# Patient Record
Sex: Female | Born: 1935
Health system: Southern US, Community
[De-identification: ages and names within clinical notes are randomized; demographics above are authoritative.]

## PROBLEM LIST (undated history)

## (undated) DIAGNOSIS — E785 Hyperlipidemia, unspecified: Secondary | ICD-10-CM

## (undated) DIAGNOSIS — G459 Transient cerebral ischemic attack, unspecified: Secondary | ICD-10-CM

## (undated) DIAGNOSIS — E039 Hypothyroidism, unspecified: Secondary | ICD-10-CM

## (undated) DIAGNOSIS — R000 Tachycardia, unspecified: Secondary | ICD-10-CM

## (undated) DIAGNOSIS — G250 Essential tremor: Secondary | ICD-10-CM

## (undated) HISTORY — DX: Essential tremor: G25.0

## (undated) HISTORY — DX: Hypothyroidism, unspecified: E03.9

## (undated) HISTORY — PX: BREAST BIOPSY: SHX20

## (undated) HISTORY — DX: Hyperlipidemia, unspecified: E78.5

## (undated) HISTORY — PX: ABDOMINAL HYSTERECTOMY: SHX81

## (undated) HISTORY — DX: Transient cerebral ischemic attack, unspecified: G45.9

## (undated) HISTORY — DX: Tachycardia, unspecified: R00.0

## (undated) HISTORY — PX: BLADDER SURGERY: SHX569

---

## 2004-02-15 ENCOUNTER — Other Ambulatory Visit: Admission: RE | Admit: 2004-02-15 | Discharge: 2004-02-15 | Payer: Self-pay | Admitting: Internal Medicine

## 2004-07-18 ENCOUNTER — Inpatient Hospital Stay (HOSPITAL_COMMUNITY): Admission: EM | Admit: 2004-07-18 | Discharge: 2004-07-20 | Payer: Self-pay | Admitting: Emergency Medicine

## 2004-10-14 ENCOUNTER — Ambulatory Visit: Payer: Self-pay | Admitting: Internal Medicine

## 2004-10-20 ENCOUNTER — Ambulatory Visit: Payer: Self-pay | Admitting: Internal Medicine

## 2004-10-31 ENCOUNTER — Ambulatory Visit: Payer: Self-pay | Admitting: Internal Medicine

## 2005-01-11 ENCOUNTER — Ambulatory Visit: Payer: Self-pay | Admitting: Internal Medicine

## 2005-02-09 ENCOUNTER — Ambulatory Visit: Payer: Self-pay | Admitting: Internal Medicine

## 2005-03-31 ENCOUNTER — Ambulatory Visit: Payer: Self-pay | Admitting: Internal Medicine

## 2005-05-17 ENCOUNTER — Ambulatory Visit: Payer: Self-pay | Admitting: Internal Medicine

## 2005-06-05 ENCOUNTER — Ambulatory Visit: Payer: Self-pay | Admitting: Internal Medicine

## 2005-08-22 ENCOUNTER — Ambulatory Visit: Payer: Self-pay | Admitting: Internal Medicine

## 2005-08-23 ENCOUNTER — Ambulatory Visit: Payer: Self-pay | Admitting: Internal Medicine

## 2005-11-02 ENCOUNTER — Ambulatory Visit: Payer: Self-pay | Admitting: Internal Medicine

## 2006-02-27 ENCOUNTER — Ambulatory Visit: Payer: Self-pay | Admitting: Family Medicine

## 2006-08-24 ENCOUNTER — Ambulatory Visit: Payer: Self-pay | Admitting: Family Medicine

## 2006-08-31 ENCOUNTER — Ambulatory Visit: Payer: Self-pay | Admitting: Internal Medicine

## 2007-03-25 ENCOUNTER — Ambulatory Visit: Payer: Self-pay | Admitting: Internal Medicine

## 2007-03-25 LAB — CONVERTED CEMR LAB: TSH: 6.74 microintl units/mL — ABNORMAL HIGH (ref 0.35–5.50)

## 2007-04-02 ENCOUNTER — Ambulatory Visit: Payer: Self-pay | Admitting: Internal Medicine

## 2007-04-05 DIAGNOSIS — S32020A Wedge compression fracture of second lumbar vertebra, initial encounter for closed fracture: Secondary | ICD-10-CM | POA: Insufficient documentation

## 2007-04-05 DIAGNOSIS — E039 Hypothyroidism, unspecified: Secondary | ICD-10-CM | POA: Insufficient documentation

## 2007-05-02 ENCOUNTER — Encounter: Admission: RE | Admit: 2007-05-02 | Discharge: 2007-05-02 | Payer: Self-pay | Admitting: Internal Medicine

## 2007-05-08 ENCOUNTER — Encounter (INDEPENDENT_AMBULATORY_CARE_PROVIDER_SITE_OTHER): Payer: Self-pay | Admitting: *Deleted

## 2007-07-08 ENCOUNTER — Telehealth (INDEPENDENT_AMBULATORY_CARE_PROVIDER_SITE_OTHER): Payer: Self-pay | Admitting: *Deleted

## 2007-08-09 ENCOUNTER — Telehealth (INDEPENDENT_AMBULATORY_CARE_PROVIDER_SITE_OTHER): Payer: Self-pay | Admitting: *Deleted

## 2007-08-12 ENCOUNTER — Ambulatory Visit: Payer: Self-pay | Admitting: Internal Medicine

## 2007-08-13 LAB — CONVERTED CEMR LAB: TSH: 4.85 microintl units/mL (ref 0.35–5.50)

## 2007-11-18 ENCOUNTER — Telehealth (INDEPENDENT_AMBULATORY_CARE_PROVIDER_SITE_OTHER): Payer: Self-pay | Admitting: *Deleted

## 2007-11-28 ENCOUNTER — Ambulatory Visit: Payer: Self-pay | Admitting: Internal Medicine

## 2007-12-02 ENCOUNTER — Telehealth (INDEPENDENT_AMBULATORY_CARE_PROVIDER_SITE_OTHER): Payer: Self-pay | Admitting: *Deleted

## 2007-12-05 ENCOUNTER — Telehealth (INDEPENDENT_AMBULATORY_CARE_PROVIDER_SITE_OTHER): Payer: Self-pay | Admitting: *Deleted

## 2008-01-03 ENCOUNTER — Telehealth (INDEPENDENT_AMBULATORY_CARE_PROVIDER_SITE_OTHER): Payer: Self-pay | Admitting: *Deleted

## 2008-03-04 ENCOUNTER — Ambulatory Visit: Payer: Self-pay | Admitting: Internal Medicine

## 2008-03-04 DIAGNOSIS — G250 Essential tremor: Secondary | ICD-10-CM | POA: Insufficient documentation

## 2008-03-04 DIAGNOSIS — G252 Other specified forms of tremor: Secondary | ICD-10-CM | POA: Insufficient documentation

## 2008-03-04 DIAGNOSIS — M810 Age-related osteoporosis without current pathological fracture: Secondary | ICD-10-CM | POA: Insufficient documentation

## 2008-03-04 DIAGNOSIS — E785 Hyperlipidemia, unspecified: Secondary | ICD-10-CM | POA: Insufficient documentation

## 2008-03-04 DIAGNOSIS — M2550 Pain in unspecified joint: Secondary | ICD-10-CM | POA: Insufficient documentation

## 2008-03-04 DIAGNOSIS — I499 Cardiac arrhythmia, unspecified: Secondary | ICD-10-CM | POA: Insufficient documentation

## 2008-03-05 ENCOUNTER — Telehealth (INDEPENDENT_AMBULATORY_CARE_PROVIDER_SITE_OTHER): Payer: Self-pay | Admitting: *Deleted

## 2008-03-10 ENCOUNTER — Ambulatory Visit: Payer: Self-pay | Admitting: Internal Medicine

## 2008-03-18 ENCOUNTER — Telehealth (INDEPENDENT_AMBULATORY_CARE_PROVIDER_SITE_OTHER): Payer: Self-pay | Admitting: *Deleted

## 2008-03-18 LAB — CONVERTED CEMR LAB
AST: 25 units/L (ref 0–37)
BUN: 13 mg/dL (ref 6–23)
Basophils Absolute: 0 10*3/uL (ref 0.0–0.1)
Basophils Relative: 0 % (ref 0.0–1.0)
Calcium: 9.1 mg/dL (ref 8.4–10.5)
Chloride: 106 meq/L (ref 96–112)
Cholesterol: 222 mg/dL (ref 0–200)
Creatinine, Ser: 0.7 mg/dL (ref 0.4–1.2)
Eosinophils Relative: 2.4 % (ref 0.0–5.0)
Glucose, Bld: 119 mg/dL — ABNORMAL HIGH (ref 70–99)
HCT: 40.5 % (ref 36.0–46.0)
Monocytes Absolute: 0.8 10*3/uL (ref 0.1–1.0)
Monocytes Relative: 13.2 % — ABNORMAL HIGH (ref 3.0–12.0)
Neutro Abs: 3.7 10*3/uL (ref 1.4–7.7)
Platelets: 229 10*3/uL (ref 150–400)
RDW: 12.3 % (ref 11.5–14.6)
TSH: 0.29 microintl units/mL — ABNORMAL LOW (ref 0.35–5.50)

## 2008-06-17 ENCOUNTER — Ambulatory Visit: Payer: Self-pay | Admitting: Internal Medicine

## 2008-10-20 DIAGNOSIS — G459 Transient cerebral ischemic attack, unspecified: Secondary | ICD-10-CM

## 2008-10-20 HISTORY — DX: Transient cerebral ischemic attack, unspecified: G45.9

## 2008-11-18 ENCOUNTER — Telehealth: Payer: Self-pay | Admitting: Internal Medicine

## 2008-11-18 ENCOUNTER — Emergency Department (HOSPITAL_COMMUNITY): Admission: EM | Admit: 2008-11-18 | Discharge: 2008-11-18 | Payer: Self-pay | Admitting: Emergency Medicine

## 2008-11-19 ENCOUNTER — Ambulatory Visit: Payer: Self-pay | Admitting: Internal Medicine

## 2008-11-19 DIAGNOSIS — R739 Hyperglycemia, unspecified: Secondary | ICD-10-CM | POA: Insufficient documentation

## 2008-11-19 DIAGNOSIS — G459 Transient cerebral ischemic attack, unspecified: Secondary | ICD-10-CM | POA: Insufficient documentation

## 2008-11-23 ENCOUNTER — Telehealth (INDEPENDENT_AMBULATORY_CARE_PROVIDER_SITE_OTHER): Payer: Self-pay | Admitting: *Deleted

## 2008-11-23 ENCOUNTER — Encounter (INDEPENDENT_AMBULATORY_CARE_PROVIDER_SITE_OTHER): Payer: Self-pay | Admitting: *Deleted

## 2008-11-23 LAB — CONVERTED CEMR LAB
ALT: 12 units/L (ref 0–35)
Direct LDL: 194.6 mg/dL
HDL: 53.1 mg/dL (ref >39.0)
Hgb A1c MFr Bld: 5.9 % (ref 4.6–6.0)
TSH: 1.72 microintl units/mL (ref 0.35–5.50)
Total CHOL/HDL Ratio: 5.5
Triglycerides: 182 mg/dL — ABNORMAL HIGH (ref 0–149)

## 2008-11-24 ENCOUNTER — Telehealth (INDEPENDENT_AMBULATORY_CARE_PROVIDER_SITE_OTHER): Payer: Self-pay | Admitting: *Deleted

## 2008-11-27 ENCOUNTER — Telehealth: Payer: Self-pay | Admitting: Internal Medicine

## 2008-11-27 ENCOUNTER — Encounter: Admission: RE | Admit: 2008-11-27 | Discharge: 2008-11-27 | Payer: Self-pay | Admitting: Internal Medicine

## 2008-12-30 ENCOUNTER — Encounter (INDEPENDENT_AMBULATORY_CARE_PROVIDER_SITE_OTHER): Payer: Self-pay | Admitting: *Deleted

## 2009-01-22 ENCOUNTER — Encounter: Payer: Self-pay | Admitting: Internal Medicine

## 2009-01-22 ENCOUNTER — Ambulatory Visit: Payer: Self-pay

## 2009-01-26 ENCOUNTER — Telehealth (INDEPENDENT_AMBULATORY_CARE_PROVIDER_SITE_OTHER): Payer: Self-pay | Admitting: *Deleted

## 2009-03-17 ENCOUNTER — Telehealth (INDEPENDENT_AMBULATORY_CARE_PROVIDER_SITE_OTHER): Payer: Self-pay | Admitting: *Deleted

## 2009-04-28 ENCOUNTER — Telehealth (INDEPENDENT_AMBULATORY_CARE_PROVIDER_SITE_OTHER): Payer: Self-pay | Admitting: *Deleted

## 2009-06-23 ENCOUNTER — Telehealth (INDEPENDENT_AMBULATORY_CARE_PROVIDER_SITE_OTHER): Payer: Self-pay | Admitting: *Deleted

## 2009-07-16 ENCOUNTER — Telehealth (INDEPENDENT_AMBULATORY_CARE_PROVIDER_SITE_OTHER): Payer: Self-pay | Admitting: *Deleted

## 2009-08-23 ENCOUNTER — Telehealth: Payer: Self-pay | Admitting: Internal Medicine

## 2010-02-02 ENCOUNTER — Telehealth: Payer: Self-pay | Admitting: Internal Medicine

## 2010-02-14 ENCOUNTER — Encounter (INDEPENDENT_AMBULATORY_CARE_PROVIDER_SITE_OTHER): Payer: Self-pay | Admitting: *Deleted

## 2010-04-12 ENCOUNTER — Telehealth (INDEPENDENT_AMBULATORY_CARE_PROVIDER_SITE_OTHER): Payer: Self-pay | Admitting: *Deleted

## 2010-05-06 ENCOUNTER — Ambulatory Visit: Payer: Self-pay | Admitting: Internal Medicine

## 2010-05-06 DIAGNOSIS — L989 Disorder of the skin and subcutaneous tissue, unspecified: Secondary | ICD-10-CM | POA: Insufficient documentation

## 2010-05-06 LAB — CONVERTED CEMR LAB
HDL goal, serum: 40 mg/dL
LDL Goal: 70 mg/dL

## 2010-05-10 ENCOUNTER — Ambulatory Visit: Payer: Self-pay | Admitting: Internal Medicine

## 2010-05-10 LAB — CONVERTED CEMR LAB: Vit D, 25-Hydroxy: 39 ng/mL (ref 30–89)

## 2010-05-16 LAB — CONVERTED CEMR LAB
ALT: 9 units/L (ref 0–35)
Basophils Absolute: 0 10*3/uL (ref 0.0–0.1)
Basophils Relative: 0.4 % (ref 0.0–3.0)
Cholesterol: 163 mg/dL (ref 0–200)
Creatinine, Ser: 0.7 mg/dL (ref 0.4–1.2)
Creatinine,U: 151.2 mg/dL
Eosinophils Absolute: 0.2 10*3/uL (ref 0.0–0.7)
Eosinophils Relative: 3.6 % (ref 0.0–5.0)
HCT: 39.1 % (ref 36.0–46.0)
HDL: 54.7 mg/dL (ref >39.00)
Hemoglobin: 13.2 g/dL (ref 12.0–15.0)
Lymphocytes Relative: 31.1 % (ref 12.0–46.0)
Lymphs Abs: 1.9 10*3/uL (ref 0.7–4.0)
MCHC: 33.8 g/dL (ref 30.0–36.0)
MCV: 92.2 fL (ref 78.0–100.0)
Microalb, Ur: 2.3 mg/dL — ABNORMAL HIGH (ref 0.0–1.9)
Monocytes Absolute: 0.6 10*3/uL (ref 0.1–1.0)
Neutro Abs: 3.3 10*3/uL (ref 1.4–7.7)
Platelets: 216 10*3/uL (ref 150.0–400.0)
Potassium: 4.7 meq/L (ref 3.5–5.1)
TSH: 0.18 microintl units/mL — ABNORMAL LOW (ref 0.35–5.50)
Triglycerides: 94 mg/dL (ref 0.0–149.0)
WBC: 6 10*3/uL (ref 4.5–10.5)

## 2010-07-07 ENCOUNTER — Telehealth (INDEPENDENT_AMBULATORY_CARE_PROVIDER_SITE_OTHER): Payer: Self-pay | Admitting: *Deleted

## 2010-07-11 ENCOUNTER — Ambulatory Visit: Payer: Self-pay | Admitting: Internal Medicine

## 2010-07-15 LAB — CONVERTED CEMR LAB: TSH: 0.63 microintl units/mL (ref 0.35–5.50)

## 2010-08-22 ENCOUNTER — Telehealth: Payer: Self-pay | Admitting: Internal Medicine

## 2010-12-22 NOTE — Progress Notes (Signed)
Summary: LEVOTHYROXINE,  PRAVASTATIN REFILLS--WANTS 90 DAYS  Phone Note Refill Request Call back at Home Phone [phone] Message from:  Patient on August 22, 2010 9:30 AM  Refills Requested: Medication #1:  SYNTHROID 88 MCG TABS take 1 tab once daily  Medication #2:  PRAVASTATIN SODIUM 40 MG TABS 1 by mouth at bedtime -MUST KEEP APPT ON 04/12/10 [facility], [address], [place]     ****WANTS 90 DAY SUPPLY FOR BOTH ****  WANTS GENERIC FOR SYNTHROID   Initial call taken by: Jerolyn Shin,  August 22, 2010 9:31 AM  Follow-up for Phone Call        Okay to fill Generic Synthroid?  Follow-up by: Army Fossa CMA,  August 22, 2010 9:33 AM  Additional Follow-up for Phone Call Additional follow up Details #1::        okay generics, ok 90 days and one refill Additional Follow-up by: [id number] E. Paz MD,  August 22, 2010 11:10 AM    New/Updated Medications: LEVOTHYROXINE SODIUM 88 MCG TABS (LEVOTHYROXINE SODIUM) 1 by mouth daily. Prescriptions: LEVOTHYROXINE SODIUM 88 MCG TABS (LEVOTHYROXINE SODIUM) 1 by mouth daily.  #90 x 1   Entered by:   Army Fossa CMA   Authorized by:   Nolon Rod. Paz MD   Signed by:   Army Fossa CMA on 08/22/2010   Method used:   Electronically to        [facility] # [phone]* (retail)       [address]       [place], [place]  [address]       Ph: [phone]       Fax: [phone]   RxID:   [id number] PRAVASTATIN SODIUM 40 MG TABS (PRAVASTATIN SODIUM) 1 by mouth at bedtime -MUST KEEP APPT ON 04/12/10  #90 x 1   Entered by:   Army Fossa CMA   Authorized by:   Nolon Rod. Paz MD   Signed by:   Army Fossa CMA on 08/22/2010   Method used:   Electronically to        [facility] # [phone]* (retail)       [address]       [place], [place]  [address]       Ph: [phone]       Fax: [phone]   RxID:   [id number]

## 2010-12-22 NOTE — Assessment & Plan Note (Signed)
Summary: yearly checkup/kdc//rsh//lch   Vital Signs:  Patient profile:   76 year old female Height:      67 inches Weight:      157 pounds BMI:     24.68 Temp:     98.2 degrees F oral Pulse rate:   50 / minute Resp:     20 per minute BP sitting:   138 / 76  (left arm)  Vitals Entered By: Jeremy Johann CMA (May 06, 2010 1:09 PM) CC: cpx Comments -not fasting --refills REVIEWED MED LIST, PATIENT AGREED DOSE AND INSTRUCTION CORRECT    History of Present Illness: yearly checkup, chart reviewed Last office visit 10/2008  TIA 12-09-- no further symptoms   Hypothyroidism-- good medication compliance   Hyperlipidemia-- on statins, has not checked labs in a while   Osteoporosis-- not on Calcium or Vit D   h/o  AODM Dx 4-09 A1C 6  , patient states "I'm not diabetic"  L leg skin lesion x a while, no itching or bleeding    Allergies: 1)  ! Sulfa  Past History:  Past Medical History: Reviewed history from 11/19/2008 and no changes required. TIA 12-09 Hypothyroidism AV Re-entry tachycardia , s/p ablation aprox 2005 Hyperlipidemia Osteoporosis AODM Dx 4-09 A1C 6  Past Surgical History: Reviewed history from 03/04/2008 and no changes required. 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 CV:  Denies chest pain or discomfort, palpitations, and swelling of feet. Resp:  Denies cough and shortness of breath. GI:  Denies bloody stools, nausea, and vomiting. GU:  Denies discharge and dysuria. Psych:  emotionally now is doing well .  Physical Exam  General:  alert, well-developed, and well-nourished.   Neck:  no masses and no thyromegaly.   Breasts:  No  mass, nodules, thickening, tenderness, bulging, retraction, inflamation, nipple discharge or skin changes noted.  no axillary lymphadenopathy is Lungs:  normal respiratory effort, no intercostal retractions, no accessory muscle use, and normal breath sounds.   Heart:  normal rate, regular rhythm, no murmur, and no gallop.   Abdomen:  soft, non-tender, no distention, no masses, no guarding, and no rigidity.   Extremities:  no lower extremity edema Skin:  as the distal left pretibial area, she has a 1 cm numular skin lesion, is slightly dark, borders are slightly elevated. Not scaly. Psych:  Oriented X3, not anxious appearing, and not depressed appearing.     Impression & Recommendations:  Problem # 1:  OSTEOPOROSIS (ICD-733.00) DEXA  6-08--osteoprosis not on Ca and Vit D or fosamax (was Rx but she never tried) Plan: restart calcium and vitamin D Check vitamin D DEXA Orders: Radiology Referral (Radiology)  Problem # 2:  HEALTH SCREENING (ICD-V70.0) Td  2008 pneumonia shot--declined in the past, still declines today shingles approximately 18 2007  decided to d/c PAPs, h/o hysterectomy (no malignancy), no h/o abnormal PAPs last MMG-- 6-08, explained benefits of early cancer detection. She let me  do a breast exam today. As far as a mammogram she said "will think about it " declined Cscope or hemocult  before and today, explained benefits of early cancer  detection   Problem # 3:  TIA (ICD-435.9) history of TIA, plan is to control her risk factors Her updated medication list for this problem includes:    Aspirin 325 Mg Tabs (Aspirin)  Problem # 4:  HYPERLIPIDEMIA (ICD-272.4)  due for labs, reports good medication compliance Her updated medication list for this problem includes:    Pravastatin Sodium 40 Mg Tabs (Pravastatin sodium) .Marland Kitchen... 1 by mouth at bedtime -must keep appt on 04/12/10  Labs Reviewed: SGOT: 20 (11/19/2008)   SGPT: 12 (11/19/2008)  Lipid Goals: Chol Goal: 200  (05/06/2010)   HDL Goal: 40 (05/06/2010)   LDL Goal: 70 (05/06/2010)   TG Goal: 150 (05/06/2010)  10 Yr Risk Heart Disease: Not enough information   HDL:53.1 (11/19/2008), 44.0 (03/10/2008)  LDL:DEL (11/19/2008), DEL (03/10/2008)  Chol:291 (11/19/2008), 222 (03/10/2008)  Trig:182 (11/19/2008), 107 (03/10/2008)  Orders: Prescription Created Electronically [id number])  Problem # 5:  HYPOTHYROIDISM (ICD-244.9) good medication compliance, labs Her updated medication list for this problem includes:    Synthroid 100 Mcg Tabs (Levothyroxine sodium) .Marland Kitchen... 1 by mouth once daily - must keep appt on 05/06/10  Labs Reviewed: TSH: 1.72 (11/19/2008)    HgBA1c: 5.9 (11/19/2008) Chol: 291 (11/19/2008)   HDL: 53.1 (11/19/2008)   LDL: DEL (11/19/2008)   TG: 182 (11/19/2008)  Problem # 6:  AODM (ICD-250.00) I explained the patient that based on her labs she has at the very least early diabetes diet discuss, exercise encourage. Labs I also discussed with patient the need  for routine followups. Next visit in 4 months Her updated medication list for this problem includes:    Aspirin 325 Mg Tabs (Aspirin)  Problem # 7:  SKIN LESION (ICD-709.9) trial with lotrisone  will call if no better-- referal for bx,pt aware  Complete Medication List: 1)  Synthroid 100 Mcg Tabs (Levothyroxine sodium) .Marland Kitchen.. 1 by mouth once daily - must keep appt on 05/06/10 2)  Calcium - Vit D  3)  Aspirin 325 Mg Tabs (Aspirin) 4)  Pravastatin Sodium 40 Mg Tabs (Pravastatin sodium) .Marland Kitchen.. 1 by mouth at bedtime -must keep appt on 04/12/10 5)  Lotrisone 1-0.05 % Crea (Clotrimazole-betamethasone) .... Apply twice a day for 2 weeks   Patient Instructions: 1)  Back fasting 2)  Vitamin D ---dx osteoporosis 3)  FLP AST ALT  ---dx cholesterol 4)  Hemoglobin A1c, microalbumin, BMP, CBC---- dx  diabetes 5)  TSH---- dx  hypothyroidism 6)  Please schedule a follow-up appointment in 4 months .  Prescriptions: PRAVASTATIN SODIUM 40 MG TABS  (PRAVASTATIN SODIUM) 1 by mouth at bedtime -MUST KEEP APPT ON 04/12/10  #90 x 0   Entered and Authorized by:   Nolon Rod. Paz MD   Signed by:   Nolon Rod. Paz MD on 05/06/2010   Method used:   Electronically to        [facility] # [phone]* (retail)       [address]       [place], [place]  [address]       Ph: [phone]       Fax: [phone]   RxID:   [id number] SYNTHROID 100 MCG  TABS (LEVOTHYROXINE SODIUM) 1 by mouth once daily - MUST KEEP APPT ON 05/06/10  #90 x 0   Entered and Authorized by:   Nolon Rod. Paz MD   Signed by:   Nolon Rod. Paz MD on 05/06/2010   Method used:  Electronically to        [facility] # [phone]* (retail)       [address]       [place], [place]  [address]       Ph: [phone]       Fax: [phone]   RxID:   [id number] LOTRISONE 1-0.05 % CREA (CLOTRIMAZOLE-BETAMETHASONE) apply twice a day for 2 weeks  #1 x 0   Entered and Authorized by:   Nolon Rod. Paz MD   Signed by:   Nolon Rod. Paz MD on 05/06/2010   Method used:   Electronically to        [facility] # [phone]* (retail)       [address]       [place], [place]  [address]       Ph: [phone]       Fax: [phone]   RxID:   [id number]

## 2010-12-22 NOTE — Progress Notes (Signed)
Summary: labs  Phone Note Outgoing Call   Summary of Call: Pt needs to come for fasting TSH. 244.9 Army Fossa CMA  July 07, 2010 1:07 PM   Follow-up for Phone Call        spoke with pt and advised her that she was due for a fasting lab. pt stated she didnt know her schedule next week and will give [place] a call back to schedule appt.Karoline Caldwell Negrete  July 07, 2010 3:13 PM

## 2010-12-22 NOTE — Progress Notes (Signed)
Summary: ?rf   Phone Note Refill Request Message from:  Patient on February 02, 2010 12:59 PM  Refills Requested: Medication #1:  SYNTHROID 100 MCG  TABS 1 by mouth qd  Medication #2:  PRAVASTATIN SODIUM 40 MG TABS 1 by mouth at bedtime. Patient has moved back to [place].  Has appt with Dr. Drue Novel on 4/20.  Needs 1 month supply of meds until appt.  Last ov here 11/19/08.  OK to refill until ov?   Method Requested: [facility] Next Appointment Scheduled: 03/09/2010 Initial call taken by: Shary Decamp,  February 02, 2010 12:59 PM  Follow-up for Phone Call        okay to refill until OV Avril Busser E. Kyrah Schiro MD  February 02, 2010 4:54 PM     .Prescriptions: PRAVASTATIN SODIUM 40 MG TABS (PRAVASTATIN SODIUM) 1 by mouth at bedtime  #30 x 0   Entered by:   Shary Decamp   Authorized by:   Nolon Rod. Reynard Christoffersen MD   Signed by:   Shary Decamp on 02/02/2010   Method used:   Electronically to        [facility] # [phone]* (retail)       [address]       [place], [place]  [address]       Ph: [phone]       Fax: [phone]   RxID:   [id number] SYNTHROID 100 MCG  TABS (LEVOTHYROXINE SODIUM) 1 by mouth qd  #30 x 0   Entered by:   Shary Decamp   Authorized by:   Nolon Rod. Breyonna Nault MD   Signed by:   Shary Decamp on 02/02/2010   Method used:   Electronically to        [facility] # [phone]* (retail)       [address]       [place], [place]  [address]       Ph: [phone]       Fax: [phone]   RxID:   [id number]

## 2010-12-22 NOTE — Letter (Signed)
Summary: Primary Care Appointment Letter  [facility]  [address] [place], [place] [address]   Phone: [phone]  Fax: [phone]    02/14/2010 MRN: [id number]  [facility] [address] [place], [place]  [address]  Dear Ms. Amy Weiss,   Your Primary Care Physician [place] E. Paz MD has indicated that:    _______it is time to schedule an appointment.    _______you missed your appointment on______ and need to call and          reschedule.    _______you need to have lab work done.    _______you need to schedule an appointment discuss lab or test results.    ____x___you need to call to reschedule your appointment that is                       scheduled on april 20,2011 _________.     Please call our office as soon as possible. Our phone number is [phone]_______. Please press option 1. Our office is open 8a-12noon and 1p-5p, Monday through Friday.     Thank you,    [facility]

## 2010-12-22 NOTE — Progress Notes (Signed)
Summary: Refill Request  Phone Note Refill Request Message from:  Patient on Apr 12, 2010 8:08 AM  Refills Requested: Medication #1:  SYNTHROID 100 MCG  TABS 1 by mouth once daily - MUST KEEP APPT ON 04/12/10   Dosage confirmed as above?Dosage Confirmed   Supply Requested: 1 month [facility] on [address] in [facility] Patient had to rsh her cpx from today because of work, needs this filled for a month to get her to next appt  Next Appointment Scheduled: 6.17.11 Initial call taken by: Harold Barban,  Apr 12, 2010 8:08 AM  Follow-up for Phone Call        last appt 10/2008 (pt moved but is back), cx appt 03/09/10 & 04/12/10.  Pending appt 05/06/10...Marland KitchenMarland KitchenMarland Kitchenwill refill ONLY enough until appt Shary Decamp  Apr 12, 2010 9:29 AM     New/Updated Medications: SYNTHROID 100 MCG  TABS (LEVOTHYROXINE SODIUM) 1 by mouth once daily - MUST KEEP APPT ON 05/06/10 Prescriptions: SYNTHROID 100 MCG  TABS (LEVOTHYROXINE SODIUM) 1 by mouth once daily - MUST KEEP APPT ON 05/06/10  #24 x 0   Entered by:   Shary Decamp   Authorized by:   Nolon Rod. Paz MD   Signed by:   Shary Decamp on 04/12/2010   Method used:   Electronically to        [facility] # [phone]* (retail)       [address]       [place], [place]  [address]       Ph: [phone]       Fax: [phone]   RxID:   [id number]

## 2011-02-20 ENCOUNTER — Other Ambulatory Visit: Payer: Self-pay | Admitting: Internal Medicine

## 2011-02-20 NOTE — Telephone Encounter (Signed)
Ok 30, 1 RF Call pt, no further RF w/o OV

## 2011-02-20 NOTE — Telephone Encounter (Signed)
Left message, pt needs OV.

## 2011-02-22 ENCOUNTER — Other Ambulatory Visit: Payer: Self-pay | Admitting: *Deleted

## 2011-02-22 MED ORDER — LEVOTHYROXINE SODIUM 88 MCG PO TABS: 88.0000 ug | ORAL_TABLET | Freq: Every day | ORAL | Status: DC

## 2011-04-07 NOTE — Cardiovascular Report (Signed)
NAME:  Amy Weiss, Amy Weiss                          ACCOUNT NO.:  [id number]   MEDICAL RECORD NO.:  [id number]                   PATIENT TYPE:  INP   LOCATION:  1830                                 FACILITY:  [facility]   PHYSICIAN:  Rollene Rotunda, M.D.                DATE OF BIRTH:  1935-01-04   DATE OF PROCEDURE:  07/18/2004  DATE OF DISCHARGE:                              CARDIAC CATHETERIZATION   PRIMARY CARE PHYSICIAN:  Dr. [name], M.D.   PROCEDURE:  Left heart catheterization, selective coronary angiography.   INDICATIONS:  This is a patient with chest pain, ST segment depression  during supraventricular tachycardia.   PROCEDURE NOTE:  Left heart catheterization was performed via the right  femoral artery.  The artery was cannulated using an anterior wall puncture.  A #6 [place] JR sheath was inserted via the modified Seldinger technique.  A  preformed Judkins and a pigtail catheter were utilized.  The patient  tolerated the procedure well and left the lab in stable condition.   RESULTS:   HEMODYNAMICS:  LV 140/14, AO 136/67.   CORONARIES:  1.  The left main was normal.  2.  The LAD had a 30-40% lesion after the first diagonal.  There was a mid-      50% stenosis.  There was distal 30% stenosis.  A first diagonal was      moderate-sized with ostial 75% stenosis.  The second diagonal was small      and normal.  3.  The circumflex in the AV groove was normal.  There was a ramus      intermediae, which was large and normal.  The first OM-1 was small and      normal.  An OM-2 and OM-3 were moderate-sized and normal.  4.  The right coronary artery was a large, dominant vessel and normal.      There was a PDA and posterolaterals.   LEFT VENTRICULOGRAM:  The left ventriculogram was obtained in the RAO  projection.  The EF was 65% with normal wall motion.   CONCLUSION:  Nonobstructive coronary artery disease.  There is ostial  diagonal plaque.  However, this would be very  difficult to treat  percutaneously.   PLAN:  The patient will have aggressive risk reduction and I have begun to  discuss with her.  Will check a lipid profile and manage this aggressively.  No further cardiovascular testing for her coronary disease is suggested.  She has been seen by Dr. Graciela Husbands for consideration of supraventricular  tachycardia ablation.                                               Rollene Rotunda, M.D.    Derinda Sis  D:  07/18/2004  T:  07/19/2004  Job:  [id number]   cc:   [name], MD [facility]  [phone] [place], [place] [address]

## 2011-04-07 NOTE — Op Note (Signed)
NAME:  Amy Weiss, Amy Weiss                          ACCOUNT NO.:  [id number]   MEDICAL RECORD NO.:  [id number]                   PATIENT TYPE:  INP   LOCATION:  3731                                 FACILITY:  [facility]   PHYSICIAN:  Duke Salvia, M.D.               DATE OF BIRTH:  [birth year]   DATE OF PROCEDURE:  07/20/2004  DATE OF DISCHARGE:                                 OPERATIVE REPORT   PREOPERATIVE DIAGNOSIS:  Supraventricular tachycardia.   POSTOPERATIVE DIAGNOSIS:  AV nodal re-entry.   PROCEDURE:  Invasive electrophysiology study, arrhythmia mapping,  isoproterenol infusion, intracardiac echo, radiofrequency catheter ablation.   Following obtaining informed consent, the patient was brought to the  electrophysiology laboratory and placed on the fluoroscopic table in the  supine position.  After routine prep and drape, cardiac catheterization was  performed with local anesthesia and conscious sedation.  Noninvasive blood  pressure monitoring and transcutaneous oxygen saturation monitoring and end  tidal CO2 monitoring were performed continuously throughout the procedure.  Following the procedure, the catheters were removed, hemostasis was  obtained, and the patient was transferred to the holding area in stable  condition.   CATHETERS:  5 French quadripolar catheter was inserted via the left femoral  vein to the high right atrium.  5 French quadripolar catheter was inserted via left femoral vein to the AV  junction to measure His electrogram.  5 French quadripolar catheter was inserted via the left femoral vein to the  right ventricular apex.  6 French octapolar catheter was inserted via the right femoral vein to the  coronary sinus.  7 French 4 mm deflectable tip catheter was inserted via the right femoral  vein using an SL2 sheath to map the sites in the posterior septal space.  9.5 French intracardiac echo catheter was inserted via the left femoral vein  using the  previously utilized high right atrial sheath to allow for  visualization of the posterior septal space.   Service leads 1, AVF, and V1 were monitored continuously throughout the  procedure.  Following insertion of the catheters, the stimulation protocol  included incremental atrial pacing, incremental ventricular pacing, single  atrial extra stimuli with paced cycle length of sinus rhythm, 600, 500  milliseconds, ventricular extra stimuli with paced cycle length of 600  milliseconds, atrial stimulation was undertaken from both the coronary sinus  and the high right atrium in the absence and the presence of isoproterenol.   RESULTS:  Service electrocardiogram and and intracardiac intervals:   Rhythm is sinus initial and sinus final.  Cycle length is 1059 milliseconds initial and 773 milliseconds final.  PR interval is 225 milliseconds initial and 141 milliseconds final.  QRS duration is 103 milliseconds initial and 112 milliseconds final.  QT interval 494 milliseconds initial and 421 milliseconds final.  PR interval 121 milliseconds initial and 77 milliseconds final.  Bundle branch block is absent initial and present right  bundle branch block.  Pre-excitation is absent and absent.  AH interval 111 milliseconds initial and 87 milliseconds final.  HV interval 37 milliseconds initial and 53 milliseconds final.  AV Wenckebach at sinus rhythm was 800 milliseconds and VA Wenckebach was 430  milliseconds.  The ventricular retrograde refractory at 600 milliseconds was 460  milliseconds.  Discontinuous AV nodal conduction with isolated echo beats were seen in the  absence of isoproterenol, with isoproterenol, sustained reproducible  inducible AV nodal re-entry was demonstrated.   No accessory pathway was identified.   Ventricular response to programmed stimulation:  Normal for ventricular  stimulation as described above.   Arrhythmias induced:  AV nodal re-entry (slow-fast) was  reproducibly induced  with coronary sinus pacing as well as spontaneous atrial ectopy in the  presence of isoproterenol.  The cycle length ranged about 370 to 400  milliseconds.   During a typical episode of tachycardia, the cycle length was approximately  412 milliseconds, the VA time was 12 milliseconds, the HA time was 42  milliseconds, and the AH time was 371 milliseconds.  Tachycardia included  early activation of the His, tachycardia initiation demonstrated on AH  prolongation.   Radio frequency energy:  A total of 3 minutes and 18 seconds of RF energy  were applied to multiple sites where presumed flow pathway potentials were  identified.  For the first 9 of these applications, frequently tachycardia  ensued.  These were undertaken in the presence of isoproterenol.  Because of  vigorous cardiac motion and the difficulties, it was elected at this point  to change strategies and intra-cardiac echo was applied to try to identify  the spacing between the coronary sinus and the tricuspid valve where RF  ablation has been successfully used to interrupted slow pathway conduction.  This was successfully accomplished and after the second of these  applications, junctional tachycardia ensued and slow pathway conduction  antegrade was no longer evident.  This illuminated the substrate for the  patient's clinical tachy arrhythmia.   Fluoroscopy time was, unfortunately not recorded.   Radiofrequency energy was 3 minutes 18 seconds, temperature output of 60  degrees.   IMPRESSION:  1.  Sinus bradycardia.  2.  Normal atrial function.  3.  Dual antegrade AV nodal physiology with inducible slow-fast AV nodal re-      entry tachycardia, particularly in the presence of isoproterenol.  4.  Normal His system function.  5.  No accessory pathway.  6.  Normal ventricular response to programmed stimulation.  SUMMARY AND CONCLUSION:  The results of electrophysiological testing  identified AV  nodal re-entry as the patient's clinical arrhythmia.  Slow  pathway modification was successfully undertaken using the intracardiac  echo.  Isoproterenol had been needed for induction of tachycardia.  The patient's  likely clinical recurrence rate is less than 5%.   COMPLICATIONS:  None apparent.                                               Duke Salvia, M.D.    SCK/MEDQ  D:  07/20/2004  T:  07/20/2004  Job:  [id number]   cc:   [name], MD [facility]  [phone] [place], [place] [address]

## 2011-04-07 NOTE — Discharge Summary (Signed)
NAME:  Amy Weiss, Amy Weiss                          ACCOUNT NO.:  [id number]   MEDICAL RECORD NO.:  [id number]                   PATIENT TYPE:  INP   LOCATION:  3731                                 FACILITY:  [facility]   PHYSICIAN:  Charlton Haws, M.D.                  DATE OF BIRTH:  [birth year]   DATE OF ADMISSION:  07/18/2004  DATE OF DISCHARGE:  07/20/2004                                 DISCHARGE SUMMARY   DISCHARGE DIAGNOSES:  1.  Recurrent supraventricular tachycardia probably AV nodal reentrant      tachycardia.  2.  SP modification by EPS August 31 with finding of tachycardia dependent      AV prolongation.  3.  Nonobstructive coronary artery disease by catheterization July 18, 2004.  4.  First degree AV block.  5.  Abnormal lipid profile with cholesterol 229, triglycerides 125, HDL      cholesterol 46, and LDL cholesterol 158.  Recommend Statin therapy.   SECONDARY DIAGNOSES:  1.  Status post vaginal hysterectomy secondary to nonmalignant polyp.  2.  Status post left breast biopsy, negative.  3.  Hypothyroidism with elevated TSH this admission 6.771.   PROCEDURE:  1.  Left heart catheterization, Dr. Rollene Rotunda, July 18, 2004.  The      study showed that the left main is without significant disease.  Left      anterior descending had a 30% stenosis after the first diagonal and a      50% mid point stenosis and a distal 30% stenosis.  The first diagonal is      moderate in size and had an ostial 75% stenosis.  Second diagonal is      small.  Left circumflex AV groove is normal.  Ramus intermediate normal.      First obtuse marginal is small.  Obtuse marginal 2 and 3 moderate in      size and normal.  Right coronary artery is large, dominant, and without      significant disease.  Left ventricular ejection fraction 65% without      wall motion abnormalities.  2.  July 20, 2004:  Dr. Sherryl Manges modification with finding of      tachycardia dependent AV prolongation  in patient with probable AVNRT.      The patient has tolerated both procedures well and is ready to go home      after electrophysiology study on August 30.  Once again, finding of a      mildly elevated TSH despite Synthroid therapy and finding of adverse      lipid profile but patient is not on Statin.   DISCHARGE MEDICATIONS:  1.  Aspirin 325 mg daily for at least six weeks.  2.  Synthroid 50 mcg daily.  3.  Vicodin 5/500 one to two tablets q.4-6h. as needed for pain.  4.  She  is to continue multivitamins and calcium supplements as before this      hospitalization.   Once again, it is considered that she would benefit from Statin therapy.  This can be initiated as an outpatient.  If the patient is considering  dental work or even teeth cleaning or minor surgery before December 2005,  she is to call [facility] at [phone] for antibiotic instructions.  No restrictions on activity.   DISCHARGE DIET:  Low sodium, low cholesterol diet.   FOLLOWUP:  Dr. Rollene Rotunda at [facility] [address]  Thursday, August 04, 2004 at noon.  She will see Dr. Graciela Husbands September 13, 2004 at 10:45 in the morning.   BRIEF HISTORY:  Amy Weiss is a 76 year old female.  She has a long history of  palpitations.  These have become frequent recently.  She has them usually  when she is standing up and they occur about 10 times per day.  She feels  something in her chest when these occur as she can see her chest pounding.  She has had a little discomfort into her neck.  She has had some chest  pressure when these appear.  She will get diaphoretic, but does not get  short of breath.  At Dr. Drue Novel office on visit August 29 she had narrow  complex tachycardia with a rate of 150 and she did have ST segment  depression in leads 2, 3, aVF, V3-V6.  The symptoms abated spontaneously.  The patient also probably has a reentrant supraventricular tachycardia.  Ablation was discussed on patient's  admission into the emergency room and  electrophysiology consult obtained.  Patient did have chest discomfort with  ST depression.  Troponin I x1 was 0.01 but given abnormal electrocardiogram,  she will progress to left heart catheterization.  TSH will be checked for  hypothyroidism and a lipid profile also be checked for risk reduction.   HOSPITAL COURSE:  The patient was admitted through the emergency room with  tachy palpitation and chest tightness.  Troponin I study was negative 0.01.  She was seen in consultation by Dr. Sherryl Manges who reviewed the options in  this patient with a probable AV nodal reentrant tachycardia.  He also  ordered a hepatitis profile which has been negative in all serologies.  The  patient then progressed to a left heart catheterization which showed  nonobstructive coronary artery disease as dictated above and plan for  continued risk reduction, especially in the setting of abnormal lipid  profile.  She then underwent EP study which demonstrated tachycardia  dependent AV prolongation and she underwent successful SP modification by  Dr. Graciela Husbands.  She discharges on aspirin with recommendations for possible  increase in Synthroid dose and recommendation for a Statin as an outpatient.  It is noted the patient is a self-pay, therefore, the Statin should be  selected to be among those probably generic with a lower monthly cost.      Maple Mirza, P.A.                    Charlton Haws, M.D.    GM/MEDQ  D:  07/20/2004  T:  07/21/2004  Job:  [id number]   cc:   Duke Salvia, M.D.   [name], MD [facility]  [phone] [place], [place] [address]   Rollene Rotunda, M.D.

## 2011-04-07 NOTE — H&P (Signed)
NAME:  Amy Weiss, BARCUS                          ACCOUNT NO.:  [id number]   MEDICAL RECORD NO.:  [id number]                   PATIENT TYPE:  INP   LOCATION:  1830                                 FACILITY:  [facility]   PHYSICIAN:  Rollene Rotunda, M.D.                DATE OF BIRTH:  [birth year]   DATE OF ADMISSION:  07/18/2004  DATE OF DISCHARGE:                                HISTORY & PHYSICAL   REASON FOR ADMISSION:  A patient with tachycardia and chest discomfort.   HISTORY OF PRESENT ILLNESS:  The patient is a 76 year old white female with  a long history of palpitations.  These have become very frequent recently.  She has them when she is standing up.  She has them up to 10 times per day.  She does not have them when she is laying.  She feels something in her chest  and can see her chest pounding.  She has had a little discomfort into her  neck.  She has had some chest pressure with this.  She will get diaphoretic  but does not describe any shortness of breath.  She was noted today at Dr.  Leta Jungling office to have a narrow complex tachycardia with a rate of 150.  She  did have ST segment depression II, III, aVF, V3 through V6 with this.  The  symptoms abated spontaneously.   Otherwise the patient does relatively well.  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  She is not having tachycardia.  She does not notice any chest  discomfort, neck discomfort, arm discomfort, activity induced nausea,  vomiting or excessive diaphoresis.   ALLERGIES:  SULFA.  She has no allergy to contrast or shellfish.   MEDICATIONS:  1. Synthroid 50 mcg daily.  2. Multivitamins.  3. Calcium.  4. Soy protein powder.   PAST MEDICAL HISTORY:  1. Dyslipidemia (the patient is not sure to what degree).  2. She has no history of diabetes or hypertension.  3. Positive hypothyroidism.   PAST SURGICAL HISTORY:  1. Vaginal hysterectomy.  2. Breast biopsy for benign lesion.   SOCIAL HISTORY:  The patient lives in  [place], [place], alone.  She  has three daughters and one son and eight grandchildren.  She quit smoking  in 1984.  She does not drink alcohol.  She works as an Public house manager at [employer].   FAMILY HISTORY:  Noncontributory for early coronary artery disease.  Her  father did have bypass surgery and died at age 27.   REVIEW OF SYMPTOMS:  Positive for some abdominal discomfort recently that  has been better after bowel movements or urination.  She has some joint  pains.  Otherwise, as stated in HPI.  Negative for all other systems.   PHYSICAL EXAMINATION:  GENERAL APPEARANCE:  The patient is in no distress.  VITAL SIGNS:  Blood pressure 120/40, heart rate  67 and regular, afebrile.  HEENT:  Eye lids unremarkable.  Pupils are equal, round and reactive to  light.  Fundi not visualized.  NECK:  No jugular venous distension.  Wave form within normal limits.  Carotid upstroke, brisk and symmetric, no bruits or thyromegaly.  LYMPHATICS:  No cervical, axillary or inguinal adenopathy.  LUNGS:  Clear to auscultation bilaterally.  BACK:  No costovertebral angle tenderness.  CHEST:  Unremarkable.  CARDIOVASCULAR:  PMI not displaced or sustained.  S1 and S2 within normal  limits.  No S3, no S4, no clicks, no rubs, no murmurs.  ABDOMEN:  Flat, positive bowel sounds, normal in frequency and pitch, no  bruits, no rebound, no guarding, no midline pulses, no mass, no  hepatomegaly, no splenomegaly.  SKIN:  No rashes, no nodules.  EXTREMITIES:  There are 2+ pulses throughout.  No clubbing, cyanosis, or  edema.  NEUROLOGIC:  Oriented to person, place and time.  Cranial nerves II-XII  grossly intact.  Motor grossly intact.   EKG with sinus rhythm, rate 68, axis within normal limits, borderline first  degree bundle branch block, no acute STT wave changes.   LABORATORY DATA:  Labs pending.   Chest x-ray pending.   ASSESSMENT/PLAN:  1. Supraventricular tachycardia.  The patient has a  reentrant     supraventricular tachycardia.  We discussed ablation.  She would like to     consider this option.  Will have an electrophysiology consult.  2. Chest discomfort.  The patient did have chest discomfort with ST segment     depression during the tachycardia.  Given this, she needs to be     considered to have obstructive coronary disease until proven otherwise.     Will proceed with cardiac catheterization.  I have discussed at length     the risks and benefits of this with the patient.  She understands     completely and agrees to proceed.  3. Hypothyroidism.  Will check a TSH.  4. Risk reduction.  Check a lipid profile.                                                Rollene Rotunda, M.D.    Derinda Sis  D:  07/18/2004  T:  07/18/2004  Job:  [id number]   cc:   [name], MD [facility]  [phone] [place], [place] [address]

## 2011-04-07 NOTE — Consult Note (Signed)
NAME:  Amy Weiss, Amy Weiss                          ACCOUNT NO.:  [id number]   MEDICAL RECORD NO.:  [id number]                   PATIENT TYPE:  INP   LOCATION:  1830                                 FACILITY:  [facility]   PHYSICIAN:  Duke Salvia, M.D.               DATE OF BIRTH:  Jan 26, 1935   DATE OF CONSULTATION:  07/18/2004  DATE OF DISCHARGE:                                   CONSULTATION   REASON FOR CONSULTATION:  Thank you very much for asking me to see Ms.  Amy Weiss in electrophysiological consultation for supraclavicular  tachycardia.  Amy Weiss is a 76 year old single mother of 4 and grandmother  of 8 who has a 20-year history of recurrent abrupt onset/offset  tachypalpitations.  They were mostly triggered by standing, often relieved  by lying down and that they would come in clusters often separated by  months.  However, since last Thursday they have been coming multiple times a  day.  They are now associated for the first time with a chest discomfort  with a tightness that radiates to the right shoulder and shortness of breath  as well as some presyncope.  She feels some pounding in her chest and  intermittently in her neck, they are diuretic-negative.  She does use  caffeine.  She does not use over-the-counter cold stimulants.   PAST MEDICAL HISTORY:  Is otherwise largely negative but is notable for  exposure to hepatitis via a needle stick.   PAST SURGICAL HISTORY:  Notable for a hysterectomy and a breast biopsy.   FAMILY HISTORY:  Noncontributory.   REVIEW OF SYSTEMS:  Negative.   She takes no medications.   She is ALLERGIC to SULFA.   SOCIAL HISTORY:  Is as noted above.  She does not smoke.  She drinks  occasionally and there is a remote history of marijuana use.   PHYSICAL EXAMINATION:  GENERAL:  She is a middle to older age Caucasian  female in no acute distress who appears somewhat younger than her stated age  of 41.  VITAL SIGNS:  Blood pressure is 122/59  with a pulse of 68.  HEENT:  Demonstrates no icterus or xanthoma.  NECK:  The neck veins were flat.  The carotids were brisk and full  bilaterally without bruits.  BACK:  Without kyphosis, scoliosis.  LUNGS:  Clear.  HEART:  Sounds were regular without murmurs or gallops.  ABDOMEN:  Soft with active bowel sounds without midline pulsation of  hepatomegaly.  EXTREMITIES:  Femoral pulses were 2+, distal pulses were intact and there is  no clubbing, cyanosis, or edema.  NEUROLOGICAL:  Grossly normal.   Laboratories are pending.   Electrocardiogram dated today demonstrates sinus rhythm at 67 with intervals  of 0.20/0.09/0.41.  There is no evidence of ventricular pre-excitation.   The tachycardia electrocardiogram demonstrates a narrow QRS tachycardia with  an R prime evidenced in V1 which  is present in sinus rhythm.  I am not  otherwise able to discern a retrograde P wave although there is obliteration  of a little tiny Q wave in the inferior leads and there is an R prime  inscribed in aVL which is otherwise not there.   IMPRESSION:  1.  Recurrent supraclavicular tachycardia probably atrioventricular node re-      entry.  2.  Chest pain, shortness of breath, and presyncope associated with #1.  3.  Hepatitis exposure, question hepatitis B.   Amy Weiss has recurrent supraclavicular tachycardia that is increasingly  symptomatic.  We have discussed treatment options including AV nodal  blocking drugs, antiarrhythmic drug therapy with the potential of  proarrhythmia and EP testing with RF catheter ablation.  She understands the  potential benefits as well as potential risks of the latter including but  not limited to death, perforation, heart block requiring pacer and  __________.  She understands these risks and would like to proceed.   She is scheduled to have a catheterization later today because of the chest  pain and the associated ST changes with her tachycardia.   We will plan to  proceed with RF catheter ablation tomorrow.                                               Duke Salvia, M.D.    SCK/MEDQ  D:  07/18/2004  T:  07/18/2004  Job:  [id number]   cc:   [name], MD [facility]  [phone] [place], [place] [address]   Electrophysiologic Laboratory

## 2011-05-01 ENCOUNTER — Other Ambulatory Visit: Payer: Self-pay | Admitting: Internal Medicine

## 2011-05-05 ENCOUNTER — Encounter: Payer: Self-pay | Admitting: Internal Medicine

## 2011-05-09 ENCOUNTER — Ambulatory Visit (INDEPENDENT_AMBULATORY_CARE_PROVIDER_SITE_OTHER): Payer: Medicare Other | Admitting: Internal Medicine

## 2011-05-09 ENCOUNTER — Encounter: Payer: Self-pay | Admitting: Internal Medicine

## 2011-05-09 DIAGNOSIS — M810 Age-related osteoporosis without current pathological fracture: Secondary | ICD-10-CM

## 2011-05-09 DIAGNOSIS — E785 Hyperlipidemia, unspecified: Secondary | ICD-10-CM

## 2011-05-09 DIAGNOSIS — G250 Essential tremor: Secondary | ICD-10-CM

## 2011-05-09 DIAGNOSIS — E119 Type 2 diabetes mellitus without complications: Secondary | ICD-10-CM

## 2011-05-09 DIAGNOSIS — G252 Other specified forms of tremor: Secondary | ICD-10-CM

## 2011-05-09 DIAGNOSIS — Z0000 Encounter for general adult medical examination without abnormal findings: Secondary | ICD-10-CM

## 2011-05-09 DIAGNOSIS — E039 Hypothyroidism, unspecified: Secondary | ICD-10-CM

## 2011-05-09 LAB — LIPID PANEL: Triglycerides: 97 mg/dL (ref 0.0–149.0)

## 2011-05-09 LAB — BASIC METABOLIC PANEL
CO2: 30 mEq/L (ref 19–32)
Chloride: 108 mEq/L (ref 96–112)
Glucose, Bld: 84 mg/dL (ref 70–99)
Potassium: 4.8 mEq/L (ref 3.5–5.1)
Sodium: 142 mEq/L (ref 135–145)

## 2011-05-09 LAB — LDL CHOLESTEROL, DIRECT: Direct LDL: 116.6 mg/dL

## 2011-05-09 MED ORDER — FLUCONAZOLE 150 MG PO TABS: 150.0000 mg | ORAL_TABLET | Freq: Every day | ORAL | Status: DC

## 2011-05-09 NOTE — Assessment & Plan Note (Addendum)
DEXA  6-08--osteoprosis not on Ca and Vit D --- rec to restart At some point was Rx  Fosamax but she never tried. Plan: restart calcium and vitamin  DEXA--- declined ! Fosamax or other med -- unwilling to try

## 2011-05-09 NOTE — Assessment & Plan Note (Addendum)
Td  2008 pneumonia shot--declined again today, explained benefits shingles approximately 18 2007  decided to have no more PAPs, h/o hysterectomy (no malignancy), no h/o abnormal PAPs Declines a vaginal exam today, has sx c/w vaginitis, rx diflucan x 2, call if no better   No MMG since 6-08, not interested on have one done; explained benefits of early cancer detection.  She let me  do a breast exam today which is normal, also recommend SBE   Never had a cscope: declined Cscope but agreed to do an iFOB. Aware Cscope is a better tool to detect cancer.  Diet-exercise discussed

## 2011-05-09 NOTE — Assessment & Plan Note (Addendum)
Good med compliance , labs  

## 2011-05-09 NOTE — Assessment & Plan Note (Signed)
Reports sx are worse, today on exam she hardly have any tremor H/o BB intolerance If sx increase will need neuro referral, pt aware

## 2011-05-09 NOTE — Assessment & Plan Note (Signed)
Due for labs

## 2011-05-09 NOTE — Patient Instructions (Signed)
Diflucan once a day x 2 days, call if no better by next week

## 2011-05-09 NOTE — Progress Notes (Signed)
  Subjective:    Patient ID: Amy Weiss, female    DOB: 10/09/1935, 76 y.o.   MRN: [id number]  HPI Here for Medicare AWV:  1. Risk factors based on Past M, S, F history: reviewed 2. Physical Activities:  Home chores, yard work, still working , very active  3. Depression/mood:  No problems noted or reported  4. Hearing:  No problems noted or reported  5. ADL's:  Independent  6. Fall Risk: no recent falls  7. home Safety: does feelsafe at home  8. Height, weight, &visual acuity: see VS, just went to the eye doctor, got good reports , uses contacts 9. Counseling: provided 10. Labs ordered based on risk factors: if needed  11. Referral Coordination: if needed 12.  Care Plan, see assessment and plan  13.   Cognitive Assessment: Motor skills and cognition  normal  In addition, today we discussed the following: Hyppothyroid, due for labs, good med compliance  TIA, on meds to control her CV RF, no recent sx  H/o tachydarcia, asx, see ROS DM-- on no meds, no amb CBGs Tremor-- a lot worse over the last year Osteoporosis -- of ca and vit d x 2 months "just tired of taking it "  Past Medical History  Diagnosis Date  . TIA (transient ischemic attack) 12/09  . Hypothyroidism   . Tachycardia     AV Re-entry, s/p ablation aprox 2005  . Hyperlipidemia   . Osteoporosis   . Diabetes mellitus 4/09    A1C-6  . 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  Respiratory: Negative for cough, shortness of breath and wheezing.   Cardiovascular: Negative for chest pain and leg swelling.  Gastrointestinal: Negative for nausea, vomiting, abdominal pain, diarrhea and blood in stool.  Genitourinary: Negative for  dysuria and hematuria.       Objective:   Physical Exam  Constitutional: She is oriented to person, place, and time. She appears well-developed and well-nourished. No distress.  Neck: No thyromegaly present.       Normal carotid pulses  Cardiovascular: Normal rate, regular rhythm and normal heart sounds.   No murmur heard. Pulmonary/Chest: Effort normal and breath sounds normal. No respiratory distress. She has no wheezes. She has no rales.  Abdominal: Soft. Bowel sounds are normal. She exhibits no distension. There is no tenderness. There is no rebound.  Genitourinary:       Breast exam bilaterally without dominant mass, no axillary lymphadenopathis or tenderness  Musculoskeletal: She exhibits no edema.  Neurological: She is alert and oriented to person, place, and time.  Skin: Skin is warm and dry. She is not diaphoretic.  Psychiatric: She has a normal mood and affect. Her behavior is normal. Judgment and thought content normal.          Assessment & Plan:

## 2011-05-10 ENCOUNTER — Encounter: Payer: Self-pay | Admitting: *Deleted

## 2011-05-10 DIAGNOSIS — Z1211 Encounter for screening for malignant neoplasm of colon: Secondary | ICD-10-CM

## 2011-05-29 ENCOUNTER — Other Ambulatory Visit: Payer: Self-pay | Admitting: Internal Medicine

## 2011-08-25 LAB — CBC
HCT: 41.3 % (ref 36.0–46.0)
Hemoglobin: 13.7 g/dL (ref 12.0–15.0)
MCHC: 33.2 g/dL (ref 30.0–36.0)
MCV: 93.9 fL (ref 78.0–100.0)
Platelets: 283 10*3/uL (ref 150–400)
RBC: 4.41 MIL/uL (ref 3.87–5.11)
RDW: 12.9 % (ref 11.5–15.5)
WBC: 6.9 10*3/uL (ref 4.0–10.5)

## 2011-08-25 LAB — BASIC METABOLIC PANEL
BUN: 15 mg/dL (ref 6–23)
CO2: 27 mEq/L (ref 19–32)
Calcium: 9.5 mg/dL (ref 8.4–10.5)
Chloride: 104 mEq/L (ref 96–112)
Creatinine, Ser: 0.63 mg/dL (ref 0.4–1.2)
GFR calc Af Amer: >60 mL/min (ref >60)
GFR calc non Af Amer: >60 mL/min (ref >60)
Glucose, Bld: 105 mg/dL — ABNORMAL HIGH (ref 70–99)
Potassium: 4.3 mEq/L (ref 3.5–5.1)
Sodium: 139 mEq/L (ref 135–145)

## 2011-08-25 LAB — DIFFERENTIAL
Eosinophils Relative: 1 % (ref 0–5)
Lymphs Abs: 1.2 10*3/uL (ref 0.7–4.0)

## 2011-11-24 ENCOUNTER — Other Ambulatory Visit: Payer: Self-pay | Admitting: Internal Medicine

## 2011-12-27 ENCOUNTER — Other Ambulatory Visit: Payer: Self-pay | Admitting: Internal Medicine

## 2011-12-27 NOTE — Telephone Encounter (Signed)
Refill done.  

## 2011-12-29 ENCOUNTER — Ambulatory Visit (INDEPENDENT_AMBULATORY_CARE_PROVIDER_SITE_OTHER): Payer: Medicare Other | Admitting: Family Medicine

## 2011-12-29 ENCOUNTER — Other Ambulatory Visit: Payer: Self-pay | Admitting: Internal Medicine

## 2011-12-29 ENCOUNTER — Encounter: Payer: Self-pay | Admitting: Family Medicine

## 2011-12-29 VITALS — BP 115/75 | HR 50 | Temp 98.8°F | Ht 67.5 in | Wt 165.8 lb

## 2011-12-29 DIAGNOSIS — L259 Unspecified contact dermatitis, unspecified cause: Secondary | ICD-10-CM

## 2011-12-29 DIAGNOSIS — L309 Dermatitis, unspecified: Secondary | ICD-10-CM | POA: Insufficient documentation

## 2011-12-29 MED ORDER — PRAVASTATIN SODIUM 40 MG PO TABS: 40.0000 mg | ORAL_TABLET | Freq: Every day | ORAL | Status: DC

## 2011-12-29 MED ORDER — TRIAMCINOLONE ACETONIDE 0.1 % EX OINT: TOPICAL_OINTMENT | Freq: Two times a day (BID) | CUTANEOUS | Status: DC

## 2011-12-29 NOTE — Assessment & Plan Note (Signed)
Pt's sxs consistent w/ eczema.  Start steroid ointment to areas on upper back/flanks.  Start Vit E cream or OTC hydrocortisone cream to breast and face to avoid thinning already thin skin.  Reviewed supportive care and red flags that should prompt return.  Pt expressed understanding and is in agreement w/ plan.

## 2011-12-29 NOTE — Telephone Encounter (Signed)
Refill done.  

## 2011-12-29 NOTE — Progress Notes (Signed)
  Subjective:    Patient ID: Amy Weiss, female    DOB: 1935-07-24, 77 y.o.   MRN: [id number]  HPI Rash- under L arm for 'a long time'.  Thought it was related to deodorant or bra- changed detergent and deodorant w/out relief.  'now it's systemic'.  Used lavendar oil w/ temporary relief.  Reports rash is very itchy, now spread to L breast and around L eye.  No fevers.  No symptoms on R side.   Review of Systems For ROS see HPI     Objective:   Physical Exam  Vitals reviewed. Constitutional: She appears well-developed and well-nourished. No distress.  Skin: Skin is warm and dry.       Eczematous patches under arms and extending onto back bilaterally. 2 small areas on L areola consistent w/ dry skin 1 small red area just lateral to corner of R eye, consistent w/ dry, inflamed skin          Assessment & Plan:

## 2011-12-29 NOTE — Patient Instructions (Signed)
The areas under your arms are consistent w/ eczema Start the steroid cream twice daily until symptoms improve Once symptoms improve, switch to regular lotion at least twice daily Vit E cream on the breast and face Call with any questions or concerns Hang in there!!!

## 2012-04-25 ENCOUNTER — Ambulatory Visit: Payer: Medicare Other | Admitting: Family Medicine

## 2012-04-25 ENCOUNTER — Other Ambulatory Visit: Payer: Self-pay | Admitting: Internal Medicine

## 2012-04-25 NOTE — Telephone Encounter (Signed)
Refill done.  

## 2012-05-28 ENCOUNTER — Other Ambulatory Visit: Payer: Self-pay | Admitting: Internal Medicine

## 2012-05-28 NOTE — Telephone Encounter (Signed)
Per last phone note. Pt must have office visit for future refills.

## 2012-05-31 ENCOUNTER — Other Ambulatory Visit: Payer: Self-pay | Admitting: Internal Medicine

## 2012-05-31 NOTE — Telephone Encounter (Signed)
Pt has not been seen within a year & no future appointments. OK to refill?

## 2012-06-03 ENCOUNTER — Telehealth: Payer: Self-pay | Admitting: Internal Medicine

## 2012-06-03 NOTE — Telephone Encounter (Signed)
Call patient, make an appointment, call to 2 weeks supply. No further refills without appointment

## 2012-06-03 NOTE — Telephone Encounter (Signed)
Left message to call office

## 2012-06-03 NOTE — Telephone Encounter (Signed)
Rx last filled 04-25-12 #90, Pt has a 30 day supply does not need refill now. Pt advise that OV will be needed for further refills Pt ok, verbalized understanding.

## 2012-06-03 NOTE — Telephone Encounter (Signed)
See other telephone encounter.

## 2012-08-09 ENCOUNTER — Telehealth: Payer: Self-pay | Admitting: Internal Medicine

## 2012-08-09 NOTE — Telephone Encounter (Signed)
Just a ROV

## 2012-08-09 NOTE — Telephone Encounter (Signed)
Pt called wanted to schedule an appt., advised she was overdue for CPE last was 6.19.12, she advises "I just need to get in to fill my meds and I do not want a PAP" Advised she did not have to have a PAP but could still do the physical, pt again stated "I just need my meds" Does she need to scheduler physical or will he see her for medication renewal only? If so will she need fasting labs? Cb# [phone]

## 2012-08-09 NOTE — Telephone Encounter (Signed)
Patient coming 9.26.13 at 11am for 15 minute visit meds renewal-smc

## 2012-08-09 NOTE — Telephone Encounter (Signed)
Please advise 

## 2012-08-15 ENCOUNTER — Encounter: Payer: Self-pay | Admitting: Internal Medicine

## 2012-08-15 ENCOUNTER — Ambulatory Visit (INDEPENDENT_AMBULATORY_CARE_PROVIDER_SITE_OTHER): Payer: Medicare Other | Admitting: Internal Medicine

## 2012-08-15 VITALS — BP 118/76 | HR 57 | Temp 98.0°F | Wt 166.0 lb

## 2012-08-15 DIAGNOSIS — E119 Type 2 diabetes mellitus without complications: Secondary | ICD-10-CM

## 2012-08-15 DIAGNOSIS — E785 Hyperlipidemia, unspecified: Secondary | ICD-10-CM

## 2012-08-15 DIAGNOSIS — Z23 Encounter for immunization: Secondary | ICD-10-CM

## 2012-08-15 DIAGNOSIS — E039 Hypothyroidism, unspecified: Secondary | ICD-10-CM

## 2012-08-15 LAB — BASIC METABOLIC PANEL
BUN: 22 mg/dL (ref 6–23)
Calcium: 9.3 mg/dL (ref 8.4–10.5)
Creatinine, Ser: 0.8 mg/dL (ref 0.4–1.2)
GFR: 80.09 mL/min (ref >60.00)
Glucose, Bld: 89 mg/dL (ref 70–99)

## 2012-08-15 LAB — CBC WITH DIFFERENTIAL/PLATELET
Basophils Relative: 0.4 % (ref 0.0–3.0)
Eosinophils Relative: 2.7 % (ref 0.0–5.0)
HCT: 44.2 % (ref 36.0–46.0)
Hemoglobin: 14.6 g/dL (ref 12.0–15.0)
Lymphs Abs: 1.8 10*3/uL (ref 0.7–4.0)
MCHC: 32.9 g/dL (ref 30.0–36.0)
MCV: 94.1 fl (ref 78.0–100.0)
Monocytes Absolute: 0.6 10*3/uL (ref 0.1–1.0)
Neutro Abs: 3.7 10*3/uL (ref 1.4–7.7)
Neutrophils Relative %: 58 % (ref 43.0–77.0)
RBC: 4.7 Mil/uL (ref 3.87–5.11)
WBC: 6.4 10*3/uL (ref 4.5–10.5)

## 2012-08-15 LAB — HEMOGLOBIN A1C: Hgb A1c MFr Bld: 5.8 % (ref 4.6–6.5)

## 2012-08-15 LAB — LDL CHOLESTEROL, DIRECT: Direct LDL: 147 mg/dL

## 2012-08-15 LAB — ALT: ALT: 12 U/L (ref 0–35)

## 2012-08-15 MED ORDER — LEVOTHYROXINE SODIUM 88 MCG PO TABS: 88.0000 ug | ORAL_TABLET | Freq: Every day | ORAL | Status: DC

## 2012-08-15 MED ORDER — PRAVASTATIN SODIUM 40 MG PO TABS: 40.0000 mg | ORAL_TABLET | Freq: Every day | ORAL | Status: DC

## 2012-08-15 NOTE — Progress Notes (Signed)
  Subjective:    Patient ID: Amy Weiss, female    DOB: 03-22-1935, 77 y.o.   MRN: [id number]  HPI Borderline hyperglycemia, diet is very good, very active, takes walks more than once a day. High cholesterol, good medication compliance, no apparent side effects Hypothyroidism, good medication compliance, History of tremors in the left hand, still an issue.  Past Medical History: TIA 12-09 Hypothyroidism AV Re-entry tachycardia , s/p ablation aprox 2005 Hyperlipidemia Osteoporosis AODM Dx 4-09 A1C 6 Familial tremor  Past Surgical History: Reviewed history from 03/04/2008 and no changes required. 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     Objective:   Physical Exam General -- alert, well-developed, and well-nourished.   Neck --no thyromegaly Lungs -- normal respiratory effort, no intercostal retractions, no accessory muscle use, and normal breath sounds.   Heart-- normal rate, regular rhythm, no murmur, and no gallop.   Extremities-- no pretibial edema bilaterally Neurologic-- alert & oriented X3 and strength normal in all extremities. Psych-- Cognition and judgment appear intact. Alert and cooperative with normal attention span and concentration.  not anxious appearing and not depressed appearing.       Assessment & Plan:

## 2012-08-15 NOTE — Assessment & Plan Note (Signed)
Borderline diabetes, on diet control, check the A1c and a microalbumin. She does have a healthy lifestyle

## 2012-08-15 NOTE — Assessment & Plan Note (Signed)
Good compliance with medication, check a TSH 

## 2012-08-15 NOTE — Assessment & Plan Note (Signed)
Good compliance with cholesterol medication, has a healthy lifestyle. Labs

## 2012-08-15 NOTE — Patient Instructions (Addendum)
Next office visit in one year although if you like to have a "complete physical exam" please come back in 6 months.

## 2012-08-27 ENCOUNTER — Telehealth: Payer: Self-pay

## 2012-08-27 DIAGNOSIS — E785 Hyperlipidemia, unspecified: Secondary | ICD-10-CM

## 2012-08-27 DIAGNOSIS — E7800 Pure hypercholesterolemia, unspecified: Secondary | ICD-10-CM

## 2012-08-27 NOTE — Telephone Encounter (Signed)
Pt states took 4-5 days Prevastatin 80 mg now taking 40 mg because gave her severe headaches. You increased dose to 80 mg after labs stated high cholesterol. Pt wants to advise you she is only taking 40 mg per day. Plz advise        MW

## 2012-08-27 NOTE — Telephone Encounter (Signed)
Recommend to switch to Lipitor 40 mg 1 by mouth each bedtime #30 and 3 refills. (Hopefully she won't have any problems, if she does, we will consider Pravachol 40 mg plus Zetia) Arrange a FLP, AST ALT in 2 months after she starts Lipitor

## 2012-08-27 NOTE — Telephone Encounter (Signed)
Pt states she really does not want to take lipitor. Pt states she went to the store & got apple cider vinegar & bee honey, she read in an article that this is suppose to lower cholesterol. Pt would like to try this first to see if it helps. She would like your opinion on this & if you think she should take pravastatin 40mg  with the apple cider vinegar & bee honey. Please advise.

## 2012-08-28 NOTE — Telephone Encounter (Signed)
Her last cholesterol panel showed that she needs better control, pravastatin 40 mg is not helping enough. I think she needs either: --Lipitor --Pravachol 40 mg plus Zetia If she likes to try Pravachol plus vinegar and honey I can't oppose ----> check  at FLP in 6 weeks.

## 2012-08-28 NOTE — Telephone Encounter (Signed)
lmovm for pt to return call.  

## 2012-08-29 NOTE — Telephone Encounter (Signed)
[facility] Triage Call Report Triage Record Num: [id number] Operator: Rebeca Allegra Patient Name: Amy Weiss Call Date & Time: 08/28/2012 5:05:23PM Patient Phone: [phone] PCP: Patient Gender: Female PCP Fax : Patient DOB: 24-Jul-1935 Practice Name: [facility] Reason for Call: Caller: [name]/Patient; PCP: Willow Ora; CB#: [phone]; Call regarding Missed Call; from office 08/28/12. RN verified via EPIC. [name] MA left a message for pt to call back regarding Dr. Leta Jungling note written 08/28/12 1504 which reads: Her last cholesterol panel showed that she needs better control, pravastatin 40 mg is not helping enough. I think she needs either: --Lipitor --Pravachol 40 mg plus Zetia If she likes to try Pravachol plus vinegar and honey I can't oppose ----> check at FLP in 6 weeks. Message relayed to pt who verbalized understanding. Advised pt to follow up with office 08/29/12 for further information. Protocol(s) Used: Office Note Recommended Outcome per Protocol: Information Noted and Sent to Office Reason for Outcome: Caller information to office Care Advice: ~

## 2012-08-29 NOTE — Telephone Encounter (Signed)
Discussed with pt. She states she is going to take pravastatin 40mg  along with the vinegar & bee honey. Scheduled pt & entered lab orders.

## 2012-10-04 ENCOUNTER — Other Ambulatory Visit (INDEPENDENT_AMBULATORY_CARE_PROVIDER_SITE_OTHER): Payer: Medicare Other

## 2012-10-04 DIAGNOSIS — E7800 Pure hypercholesterolemia, unspecified: Secondary | ICD-10-CM

## 2012-10-04 LAB — LDL CHOLESTEROL, DIRECT: Direct LDL: 124.3 mg/dL

## 2012-10-04 LAB — LIPID PANEL
Cholesterol: 227 mg/dL — ABNORMAL HIGH (ref 0–200)
Total CHOL/HDL Ratio: 3

## 2012-10-05 LAB — MICROALBUMIN / CREATININE URINE RATIO: Microalb Creat Ratio: 10 mg/g (ref 0.0–30.0)

## 2012-10-07 ENCOUNTER — Other Ambulatory Visit: Payer: Medicare Other

## 2012-10-11 ENCOUNTER — Encounter: Payer: Self-pay | Admitting: *Deleted

## 2012-10-29 ENCOUNTER — Telehealth: Payer: Self-pay | Admitting: *Deleted

## 2012-10-29 MED ORDER — PRAVASTATIN SODIUM 80 MG PO TABS: 80.0000 mg | ORAL_TABLET | Freq: Every day | ORAL | Status: DC

## 2012-10-29 NOTE — Telephone Encounter (Signed)
Message copied by Nada Maclachlan on Tue Oct 29, 2012 10:25 AM ------      Message from: [place]      Created: Tue Oct 29, 2012 10:02 AM      Regarding: Pravastatin refill       Pt needs refill on Pravastatin 80mg  called to [facility] in [place]. Call back # [phone]

## 2012-10-29 NOTE — Telephone Encounter (Signed)
Refill done.  

## 2012-11-06 ENCOUNTER — Ambulatory Visit: Payer: Medicare Other | Admitting: Internal Medicine

## 2013-01-19 ENCOUNTER — Other Ambulatory Visit: Payer: Self-pay | Admitting: Internal Medicine

## 2013-01-20 NOTE — Telephone Encounter (Signed)
Refill done.  

## 2013-03-08 ENCOUNTER — Other Ambulatory Visit: Payer: Self-pay | Admitting: Internal Medicine

## 2013-03-10 NOTE — Telephone Encounter (Signed)
Refill done.  

## 2013-04-28 ENCOUNTER — Other Ambulatory Visit: Payer: Self-pay | Admitting: Internal Medicine

## 2013-05-08 ENCOUNTER — Encounter: Payer: Medicare Other | Admitting: Internal Medicine

## 2013-06-20 ENCOUNTER — Encounter: Payer: Medicare Other | Admitting: Internal Medicine

## 2013-07-01 ENCOUNTER — Encounter: Payer: Medicare Other | Admitting: Internal Medicine

## 2013-07-22 ENCOUNTER — Other Ambulatory Visit: Payer: Self-pay | Admitting: Internal Medicine

## 2013-07-22 NOTE — Telephone Encounter (Signed)
Pt has future appt scheduled 9.30.14 Refill done.

## 2013-08-19 ENCOUNTER — Encounter: Payer: Medicare Other | Admitting: Internal Medicine

## 2013-09-19 ENCOUNTER — Other Ambulatory Visit: Payer: Self-pay | Admitting: Internal Medicine

## 2013-09-19 NOTE — Telephone Encounter (Signed)
rx refilled per protocol  

## 2013-10-09 ENCOUNTER — Telehealth: Payer: Self-pay

## 2013-10-09 NOTE — Telephone Encounter (Addendum)
Left message for call back Identifiable Medication and allergies: reviewed and updated  90 day supply/mail order: na Local pharmacy: [facility] [address] [place]   Immunizations due:   Health Maintenance  Topic Date Due  . Colonoscopy  09/15/1987  . Zostavax  09/14/1997  . Pneumococcal Polysaccharide Vaccine Age 76 And Over  09/14/2002  . Influenza Vaccine  06/20/2014  . Tetanus/tdap  04/01/2017   A/P:   No changes to FH or PSH decided to have no more PAPs, h/o hysterectomy (no malignancy), no h/o abnormal PAPs No MMG since 6-08, not interested on have one done; explained benefits of early cancer detection.  Never had CCS  To Discuss with Provider: Bruising easily that doesn't go away for approx 1 month

## 2013-10-10 ENCOUNTER — Ambulatory Visit (INDEPENDENT_AMBULATORY_CARE_PROVIDER_SITE_OTHER): Payer: Medicare Other | Admitting: Internal Medicine

## 2013-10-10 ENCOUNTER — Encounter: Payer: Self-pay | Admitting: Internal Medicine

## 2013-10-10 VITALS — BP 153/73 | HR 64 | Temp 98.1°F | Resp 16 | Wt 163.0 lb

## 2013-10-10 DIAGNOSIS — E119 Type 2 diabetes mellitus without complications: Secondary | ICD-10-CM

## 2013-10-10 DIAGNOSIS — G459 Transient cerebral ischemic attack, unspecified: Secondary | ICD-10-CM

## 2013-10-10 DIAGNOSIS — M810 Age-related osteoporosis without current pathological fracture: Secondary | ICD-10-CM

## 2013-10-10 DIAGNOSIS — E785 Hyperlipidemia, unspecified: Secondary | ICD-10-CM

## 2013-10-10 DIAGNOSIS — Z0000 Encounter for general adult medical examination without abnormal findings: Secondary | ICD-10-CM

## 2013-10-10 DIAGNOSIS — E039 Hypothyroidism, unspecified: Secondary | ICD-10-CM

## 2013-10-10 DIAGNOSIS — I498 Other specified cardiac arrhythmias: Secondary | ICD-10-CM

## 2013-10-10 LAB — COMPREHENSIVE METABOLIC PANEL
ALT: 15 U/L (ref 0–35)
AST: 25 U/L (ref 0–37)
Albumin: 4 g/dL (ref 3.5–5.2)
CO2: 28 mEq/L (ref 19–32)
Calcium: 9.7 mg/dL (ref 8.4–10.5)
Creatinine, Ser: 0.7 mg/dL (ref 0.4–1.2)
GFR: 81.08 mL/min (ref >60.00)
Glucose, Bld: 87 mg/dL (ref 70–99)
Sodium: 139 mEq/L (ref 135–145)

## 2013-10-10 LAB — LIPID PANEL
LDL Cholesterol: 95 mg/dL (ref 0–99)
Total CHOL/HDL Ratio: 2
Triglycerides: 76 mg/dL (ref 0.0–149.0)
VLDL: 15.2 mg/dL (ref 0.0–40.0)

## 2013-10-10 LAB — CBC WITH DIFFERENTIAL/PLATELET
Basophils Relative: 0.4 % (ref 0.0–3.0)
Eosinophils Absolute: 0.2 10*3/uL (ref 0.0–0.7)
Eosinophils Relative: 3.1 % (ref 0.0–5.0)
Hemoglobin: 14.4 g/dL (ref 12.0–15.0)
Lymphocytes Relative: 27.9 % (ref 12.0–46.0)
Lymphs Abs: 1.9 10*3/uL (ref 0.7–4.0)
MCHC: 33.8 g/dL (ref 30.0–36.0)
Monocytes Relative: 10.4 % (ref 3.0–12.0)
Neutro Abs: 3.9 10*3/uL (ref 1.4–7.7)
Neutrophils Relative %: 58.2 % (ref 43.0–77.0)
RBC: 4.75 Mil/uL (ref 3.87–5.11)
WBC: 6.7 10*3/uL (ref 4.5–10.5)

## 2013-10-10 NOTE — Assessment & Plan Note (Signed)
History of a TIA 2009, having problems with easy bruising with high dose of aspirin, plan: Decrease aspirin to 81 mg 2 tablets daily

## 2013-10-10 NOTE — Progress Notes (Signed)
Pre-visit discussion using our clinic review tool. No additional management support is needed unless otherwise documented below in the visit note.  

## 2013-10-10 NOTE — Assessment & Plan Note (Signed)
Diabetes, has a healthy lifestyle, check the A1c

## 2013-10-10 NOTE — Assessment & Plan Note (Signed)
History of Ablation in 2005, occasionally pulse goes down to 48 but she remains asymptomatic. Declined EKG today

## 2013-10-10 NOTE — Assessment & Plan Note (Addendum)
On ca and  vitamin D, offered a  bone density test--- declined

## 2013-10-10 NOTE — Assessment & Plan Note (Signed)
Due for labs

## 2013-10-10 NOTE — Assessment & Plan Note (Addendum)
Td  2008 pneumonia shot--declined again today, explained benefits shingles approximately ~ 2007 Had a flu shot 10-14  See previous entries: She declined further Pap smears or mammograms. See she does self breast exam and allowed me to do a breast exam today. ?bladder prolapse: Declines a vaginal exam today  Doing great w/ diet-exercise discussed

## 2013-10-10 NOTE — Progress Notes (Signed)
Subjective:    Patient ID: Amy Weiss, female    DOB: 08-01-1935, 78 y.o.   MRN: [id number]  HPI  Here for Medicare AWV:  1. Risk factors based on Past M, S, F history: reviewed 2. 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 Depression/mood:  No problems noted or reported   4. Hearing:  No problems noted or reported   5. ADL's:  Independent , still drives  6. Fall Risk: no recent falls, see instructions    7. home Safety: does feelsafe at home   8. Height, weight, &visual acuity: see VS, due to see the eye doctor, encouraged to make an appointment 9. Counseling: provided 10. Labs ordered based on risk factors: if needed   11. Referral Coordination: if needed 12.  Care Plan, see assessment and plan   13.   Cognitive Assessment: Motor skills and cognition  normal  In addition, today we discussed the following: His TIA, on aspirin 325, complained of excessive bruising with minimal bumps in her arms or legs. Prolapsed bladder? But reports occasional urinary leak, history of 2 previous bladder surgeries in the 80s. Denies vaginal bleeding, vaginal discharge or fullness. Hypothyroidism,good medication compliance. High cholesterol, good medication compliance as well.    Past Medical History  Diagnosis Date  . TIA (transient ischemic attack) 12/09  . Hypothyroidism   . Tachycardia     AV Re-entry, s/p ablation aprox 2005  . Hyperlipidemia   . Osteoporosis   . Diabetes mellitus 4/09    A1C-6  . Familial tremor    Past Surgical History  Procedure Laterality Date  . Abdominal hysterectomy      no oophorectomy  . Breast biopsy      L (-)  . Bladder surgery      x 2 in the 80s   History   Social History  . Marital Status: Divorced    Spouse Name: N/A    Number of Children: 4  . Years of Education: N/A   Occupational History  . 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 Smoking status: Former Smoker    Quit date: 06/20/2012  . Smokeless tobacco: Never Used  . Alcohol Use: No  . Drug Use: Not on file  . Sexual Activity: Not on file   Other Topics Concern  . Not on file   Social History Narrative   Single, lives by herself             Family History: breast ca - no colon ca - no pancreatic ca - bro, f cad - mother dx age 67     Review of Systems No  CP, SOB, lower extremity edema Denies  nausea, vomiting diarrhea Denies  blood in the stools (-) cough, sputum production, (-) wheezing, chest congestion No dysuria, gross hematuria, difficulty urinating       Objective:   Physical Exam BP 153/73  Pulse 64  Temp(Src) 98.1 F (36.7 C) (Tympanic)  Resp 16  Wt 163 lb (73.936 kg)  SpO2 98% General -- alert, well-developed, NAD.  Neck --no thyromegaly , normal carotid pulse, no bruits Breast-- no dominant mass, skin and nipples normal to inspection on palpation, axillary areas without mass or lymphadenopathy Lungs -- normal respiratory effort, no intercostal retractions, no accessory muscle use, and normal breath sounds.  Heart-- normal rate, regular rhythm, no  murmur.  Abdomen-- Not distended, good bowel sounds,soft, non-tender.  Extremities-- no pretibial edema bilaterally  Neurologic--  alert & oriented X3. Speech normal, gait normal, strength normal in all extremities.  Psych-- Cognition and judgment appear intact. Cooperative with normal attention span and concentration. No anxious appearing , no depressed appearing.      Assessment & Plan:

## 2013-10-10 NOTE — Patient Instructions (Signed)
Get your blood work before you leave  Next visit in 6-8 months   for a  follow up . Fasting Please make an appointment    Check the  blood pressure weekly  be sure it is between 110/60 and 140/85. Ideal blood pressure is 120/80. If it is consistently higher or lower, let me know  Fall Prevention and Home Safety Falls cause injuries and can affect all age groups. It is possible to use preventive measures to significantly decrease the likelihood of falls. There are many simple measures which can make your home safer and prevent falls. OUTDOORS  Repair cracks and edges of walkways and driveways.  Remove high doorway thresholds.  Trim shrubbery on the main path into your home.  Have good outside lighting.  Clear walkways of tools, rocks, debris, and clutter.  Check that handrails are not broken and are securely fastened. Both sides of steps should have handrails.  Have leaves, snow, and ice cleared regularly.  Use sand or salt on walkways during winter months.  In the garage, clean up grease or oil spills. BATHROOM  Install night lights.  Install grab bars by the toilet and in the tub and shower.  Use non-skid mats or decals in the tub or shower.  Place a plastic non-slip stool in the shower to sit on, if needed.  Keep floors dry and clean up all water on the floor immediately.  Remove soap buildup in the tub or shower on a regular basis.  Secure bath mats with non-slip, double-sided rug tape.  Remove throw rugs and tripping hazards from the floors. BEDROOMS  Install night lights.  Make sure a bedside light is easy to reach.  Do not use oversized bedding.  Keep a telephone by your bedside.  Have a firm chair with side arms to use for getting dressed.  Remove throw rugs and tripping hazards from the floor. KITCHEN  Keep handles on pots and pans turned toward the center of the stove. Use back burners when possible.  Clean up spills quickly and allow time for  drying.  Avoid walking on wet floors.  Avoid hot utensils and knives.  Position shelves so they are not too high or low.  Place commonly used objects within easy reach.  If necessary, use a sturdy step stool with a grab bar when reaching.  Keep electrical cables out of the way.  Do not use floor polish or wax that makes floors slippery. If you must use wax, use non-skid floor wax.  Remove throw rugs and tripping hazards from the floor. STAIRWAYS  Never leave objects on stairs.  Place handrails on both sides of stairways and use them. Fix any loose handrails. Make sure handrails on both sides of the stairways are as long as the stairs.  Check carpeting to make sure it is firmly attached along stairs. Make repairs to worn or loose carpet promptly.  Avoid placing throw rugs at the top or bottom of stairways, or properly secure the rug with carpet tape to prevent slippage. Get rid of throw rugs, if possible.  Have an electrician put in a light switch at the top and bottom of the stairs. OTHER FALL PREVENTION TIPS  Wear low-heel or rubber-soled shoes that are supportive and fit well. Wear closed toe shoes.  When using a stepladder, make sure it is fully opened and both spreaders are firmly locked. Do not climb a closed stepladder.  Add color or contrast paint or tape to grab bars and  handrails in your home. Place contrasting color strips on first and last steps.  Learn and use mobility aids as needed. Install an electrical emergency response system.  Turn on lights to avoid dark areas. Replace light bulbs that burn out immediately. Get light switches that glow.  Arrange furniture to create clear pathways. Keep furniture in the same place.  Firmly attach carpet with non-skid or double-sided tape.  Eliminate uneven floor surfaces.  Select a carpet pattern that does not visually hide the edge of steps.  Be aware of all pets. OTHER HOME SAFETY TIPS  Set the water temperature  for 120 F (48.8 C).  Keep emergency numbers on or near the telephone.  Keep smoke detectors on every level of the home and near sleeping areas. Document Released: 10/27/2002 Document Revised: 05/07/2012 Document Reviewed: 01/26/2012 [facility] Patient Information 2014 [place], [place].

## 2013-10-10 NOTE — Assessment & Plan Note (Signed)
Good medication compliance, labs 

## 2013-10-11 ENCOUNTER — Encounter: Payer: Self-pay | Admitting: Internal Medicine

## 2013-10-15 ENCOUNTER — Encounter: Payer: Self-pay | Admitting: *Deleted

## 2013-10-21 ENCOUNTER — Other Ambulatory Visit: Payer: Self-pay | Admitting: Internal Medicine

## 2013-10-21 NOTE — Telephone Encounter (Signed)
Levothyroxine and Pravastatin refilled per protocol

## 2013-12-23 ENCOUNTER — Other Ambulatory Visit: Payer: Self-pay | Admitting: *Deleted

## 2013-12-23 MED ORDER — LEVOTHYROXINE SODIUM 88 MCG PO TABS: ORAL_TABLET | ORAL | Status: DC

## 2013-12-26 ENCOUNTER — Telehealth: Payer: Self-pay

## 2013-12-26 NOTE — Telephone Encounter (Signed)
Patient called and states that she has that she has been sick for 1 week. Has cough and congestion, lungs sound clear per her nursing judgement. Begs to have amoxicillin called. Advised patient they we do not typically call in abx without seeing the patient. Ask if she was getting better or worse. Per description, not any worse but not better advised that it is more than likely viral. Advised to be seen if she starts to become increasingly worse.  Do you agree?

## 2013-12-28 NOTE — Telephone Encounter (Signed)
Yes , check on her Monday, if no better needs to be seen. We don't rx abx over the phone

## 2013-12-29 NOTE — Telephone Encounter (Signed)
Patient returned call and stated that she is feeling much better and will not need to be seen at this time. JG//CMA

## 2013-12-29 NOTE — Telephone Encounter (Signed)
Left message for call back  identifiable     

## 2014-04-20 ENCOUNTER — Other Ambulatory Visit: Payer: Self-pay | Admitting: Internal Medicine

## 2014-04-23 ENCOUNTER — Other Ambulatory Visit: Payer: Self-pay | Admitting: Internal Medicine

## 2014-06-23 ENCOUNTER — Other Ambulatory Visit: Payer: Self-pay | Admitting: Internal Medicine

## 2014-09-22 ENCOUNTER — Telehealth: Payer: Self-pay

## 2014-09-22 ENCOUNTER — Other Ambulatory Visit: Payer: Self-pay

## 2014-09-22 MED ORDER — LEVOTHYROXINE SODIUM 88 MCG PO TABS: ORAL_TABLET | ORAL | Status: DC

## 2014-09-22 NOTE — Telephone Encounter (Signed)
[facility] and 0 RF until Pt has been seen.

## 2014-09-22 NOTE — Telephone Encounter (Signed)
Amy Weiss [phone] [facility]  Macaela is almost out of her levothyroxine (SYNTHROID, LEVOTHROID) 88 MCG tablet, [facility]

## 2014-10-13 ENCOUNTER — Encounter: Payer: Self-pay | Admitting: Internal Medicine

## 2014-10-13 ENCOUNTER — Ambulatory Visit (INDEPENDENT_AMBULATORY_CARE_PROVIDER_SITE_OTHER): Payer: Medicare Other | Admitting: Internal Medicine

## 2014-10-13 VITALS — BP 128/64 | HR 37 | Temp 97.8°F | Ht 67.0 in | Wt 155.1 lb

## 2014-10-13 DIAGNOSIS — R300 Dysuria: Secondary | ICD-10-CM

## 2014-10-13 DIAGNOSIS — R000 Tachycardia, unspecified: Secondary | ICD-10-CM

## 2014-10-13 DIAGNOSIS — I498 Other specified cardiac arrhythmias: Secondary | ICD-10-CM

## 2014-10-13 DIAGNOSIS — E785 Hyperlipidemia, unspecified: Secondary | ICD-10-CM

## 2014-10-13 DIAGNOSIS — R7303 Prediabetes: Secondary | ICD-10-CM

## 2014-10-13 DIAGNOSIS — R7309 Other abnormal glucose: Secondary | ICD-10-CM

## 2014-10-13 DIAGNOSIS — E038 Other specified hypothyroidism: Secondary | ICD-10-CM

## 2014-10-13 DIAGNOSIS — Z0000 Encounter for general adult medical examination without abnormal findings: Secondary | ICD-10-CM

## 2014-10-13 NOTE — Progress Notes (Signed)
Pre visit review using our clinic review tool, if applicable. No additional management support is needed unless otherwise documented below in the visit note. 

## 2014-10-13 NOTE — Patient Instructions (Signed)
Stop by the front desk and schedule labs to be done within few days (fasting)  Please come back to the office in 4 months for a routine check up  No  fasting       Preventive Care for Adults  Ages 65 years and over  Blood pressure check.** / Every 1 to 2 years.  Lipid and cholesterol check.** / Every 5 years beginning at age 77 years.  Lung cancer screening. / Every year if you are aged 26-80 years and have a 30-pack-year history of smoking and currently smoke or have quit within the past 15 years. Yearly screening is stopped once you have quit smoking for at least 15 years or develop a health problem that would prevent you from having lung cancer treatment.  Clinical breast exam.** / Every year after age 59 years.  BRCA-related cancer risk assessment.** / For women who have family members with a BRCA-related cancer (breast, ovarian, tubal, or peritoneal cancers).  Mammogram.** / Every year beginning at age 30 years and continuing for as long as you are in good health. Consult with your health care provider.  Pap test.** / Every 3 years starting at age 80 years through age 33 or 66 years with 3 consecutive normal Pap tests. Testing can be stopped between 65 and 70 years with 3 consecutive normal Pap tests and no abnormal Pap or HPV tests in the past 10 years.  HPV screening.** / Every 3 years from ages 58 years through ages 102 or 30 years with a history of 3 consecutive normal Pap tests. Testing can be stopped between 65 and 70 years with 3 consecutive normal Pap tests and no abnormal Pap or HPV tests in the past 10 years.  Fecal occult blood test (FOBT) of stool. / Every year beginning at age 71 years and continuing until age 68 years. You may not need to do this test if you get a colonoscopy every 10 years.  Flexible sigmoidoscopy or colonoscopy.** / Every 5 years for a flexible sigmoidoscopy or every 10 years for a colonoscopy beginning at age 39 years and continuing until age 71  years.  Hepatitis C blood test.** / For all people born from 1 through 1963 and any individual with known risks for hepatitis C.  Osteoporosis screening.** / A one-time screening for women ages 1 years and over and women at risk for fractures or osteoporosis.  Skin self-exam. / Monthly.  Influenza vaccine. / Every year.  Tetanus, diphtheria, and acellular pertussis (Tdap/Td) vaccine.** / 1 dose of Td every 10 years.  Varicella vaccine.** / Consult your health care provider.  Zoster vaccine.** / 1 dose for adults aged 8 years or older.  Pneumococcal 13-valent conjugate (PCV13) vaccine.** / Consult your health care provider.  Pneumococcal polysaccharide (PPSV23) vaccine.** / 1 dose for all adults aged 82 years and older.  Meningococcal vaccine.** / Consult your health care provider.  Hepatitis A vaccine.** / Consult your health care provider.  Hepatitis B vaccine.** / Consult your health care provider.  Haemophilus influenzae type b (Hib) vaccine.** / Consult your health care provider. ** Family history and personal history of risk and conditions may change your health care provider's recommendations. Document Released: 01/02/2002 Document Revised: 03/23/2014 Document Reviewed: 04/03/2011 [facility] Patient Information 2015 [place], [place]. This information is not intended to replace advice given to you by your health care provider. Make sure you discuss any questions you have with your health care provider.    Fall Prevention and Home Safety  Falls cause injuries and can affect all age groups. It is possible to use preventive measures to significantly decrease the likelihood of falls. There are many simple measures which can make your home safer and prevent falls. OUTDOORS  Repair cracks and edges of walkways and driveways.  Remove high doorway thresholds.  Trim shrubbery on the main path into your home.  Have good outside lighting.  Clear walkways of tools, rocks,  debris, and clutter.  Check that handrails are not broken and are securely fastened. Both sides of steps should have handrails.  Have leaves, snow, and ice cleared regularly.  Use sand or salt on walkways during winter months.  In the garage, clean up grease or oil spills. BATHROOM  Install night lights.  Install grab bars by the toilet and in the tub and shower.  Use non-skid mats or decals in the tub or shower.  Place a plastic non-slip stool in the shower to sit on, if needed.  Keep floors dry and clean up all water on the floor immediately.  Remove soap buildup in the tub or shower on a regular basis.  Secure bath mats with non-slip, double-sided rug tape.  Remove throw rugs and tripping hazards from the floors. BEDROOMS  Install night lights.  Make sure a bedside light is easy to reach.  Do not use oversized bedding.  Keep a telephone by your bedside.  Have a firm chair with side arms to use for getting dressed.  Remove throw rugs and tripping hazards from the floor. KITCHEN  Keep handles on pots and pans turned toward the center of the stove. Use back burners when possible.  Clean up spills quickly and allow time for drying.  Avoid walking on wet floors.  Avoid hot utensils and knives.  Position shelves so they are not too high or low.  Place commonly used objects within easy reach.  If necessary, use a sturdy step stool with a grab bar when reaching.  Keep electrical cables out of the way.  Do not use floor polish or wax that makes floors slippery. If you must use wax, use non-skid floor wax.  Remove throw rugs and tripping hazards from the floor. STAIRWAYS  Never leave objects on stairs.  Place handrails on both sides of stairways and use them. Fix any loose handrails. Make sure handrails on both sides of the stairways are as long as the stairs.  Check carpeting to make sure it is firmly attached along stairs. Make repairs to worn or loose  carpet promptly.  Avoid placing throw rugs at the top or bottom of stairways, or properly secure the rug with carpet tape to prevent slippage. Get rid of throw rugs, if possible.  Have an electrician put in a light switch at the top and bottom of the stairs. OTHER FALL PREVENTION TIPS  Wear low-heel or rubber-soled shoes that are supportive and fit well. Wear closed toe shoes.  When using a stepladder, make sure it is fully opened and both spreaders are firmly locked. Do not climb a closed stepladder.  Add color or contrast paint or tape to grab bars and handrails in your home. Place contrasting color strips on first and last steps.  Learn and use mobility aids as needed. Install an electrical emergency response system.  Turn on lights to avoid dark areas. Replace light bulbs that burn out immediately. Get light switches that glow.  Arrange furniture to create clear pathways. Keep furniture in the same place.  Firmly attach carpet  with non-skid or double-sided tape.  Eliminate uneven floor surfaces.  Select a carpet pattern that does not visually hide the edge of steps.  Be aware of all pets. OTHER HOME SAFETY TIPS  Set the water temperature for 120 F (48.8 C).  Keep emergency numbers on or near the telephone.  Keep smoke detectors on every level of the home and near sleeping areas. Document Released: 10/27/2002 Document Revised: 05/07/2012 Document Reviewed: 01/26/2012 [facility] Patient Information 2015 [place], [place]. This information is not intended to replace advice given to you by your health care provider. Make sure you discuss any questions you have with your health care provider.

## 2014-10-13 NOTE — Progress Notes (Signed)
Subjective:    Patient ID: Amy Weiss, female    DOB: 07-Jul-1935, 79 y.o.   MRN: [id number]  DOS:  10/13/2014 Type of visit - description :   Here for Medicare AWV:  1. Risk factors based on Past M, S, F history: reviewed 2. Physical Activities: Home chores, yard work, still working part time, practices yoga x 3/w ("it has changed my life ") 3. Depression/mood:  No problems noted or reported   4. Hearing:  No problems noted or reported   5. ADL's:  Independent , still drives   6. Fall Risk: no recent falls, see instructions    7. home Safety: does feelsafe at home   8. Height, weight, &visual acuity: see VS,did see her eye doctor 6 weeks ago 9. Counseling: provided 10. Labs ordered based on risk factors: if needed   11. Referral Coordination: if needed 12.  Care Plan, see assessment and plan   13.   Cognitive Assessment: Motor skills and cognition  above average for age  79. Care Team update 15. Personalized care provided   In addition, today we discussed the following: High cholesterol, good medication compliance, no apparent side effects. Hypothyroidism, on Synthroid, due for a TSH History of AV reentry tachycardia, for the last few months has noted her pulse to be usually in the 40s but often times in the 30s like today. She feels well even with a pulse in the 30s.   ROS Denies chest pain, difficulty breathing or lower extremity edema No  nausea, vomiting, diarrhea or blood in the stools. Occasional dysuria depending on caffeine intake, denies freqa, blood in the urine, vaginal discharge or bleeding  Past Medical History  Diagnosis Date  . TIA (transient ischemic attack) 12/09  . Hypothyroidism   . Tachycardia     AV Re-entry, s/p ablation aprox 2005  . Hyperlipidemia   . Osteoporosis   . Diabetes mellitus 4/09    A1C-6  . Familial tremor     Past Surgical History  Procedure Laterality Date  . Abdominal hysterectomy      no oophorectomy  . Breast biopsy       L (-)  . Bladder surgery      x 2 in the 80s    History   Social History  . Marital Status: Divorced    Spouse Name: N/A    Number of Children: 4  . Years of Education: N/A   Occupational History  . 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 Smoking status: Former Smoker    Quit date: 06/20/2012  . Smokeless tobacco: Never Used  . Alcohol Use: No  . Drug Use: Not on file  . Sexual Activity: Not on file   Other Topics Concern  . Not on file   Social History Narrative   Single, lives by herself              Family History  Problem Relation Age of Onset  . Breast cancer Neg Hx   . Colon cancer Neg Hx   . Pancreatic cancer Brother   . Pancreatic cancer Father   . Coronary artery disease Mother 1784       Medication List       This list is accurate as of: 10/13/14 11:59 PM.  Always use your most recent med list.               aspirin  81 MG tablet  Take 162 mg by mouth daily.     CALTRATE 600 PLUS-VIT D PO  Take by mouth. 1 a day     ciprofloxacin 500 MG tablet  Commonly known as:  CIPRO  Take 1 tablet (500 mg total) by mouth 2 (two) times daily.     levothyroxine 88 MCG tablet  Commonly known as:  SYNTHROID, LEVOTHROID  TAKE ONE TABLET BY MOUTH ONCE DAILY     multivitamin with minerals Tabs tablet  Take 1 tablet by mouth daily.     pravastatin 80 MG tablet  Commonly known as:  PRAVACHOL  TAKE ONE TABLET BY MOUTH ONCE DAILY           Objective:   Physical Exam BP 128/64 mmHg  Pulse 37  Temp(Src) 97.8 F (36.6 C) (Oral)  Ht 5\' 7"  (1.702 m)  Wt 155 lb 2 oz (70.364 kg)  BMI 24.29 kg/m2  SpO2 98%  General -- alert, well-developed, NAD.  Neck --no thyromegaly  HEENT-- Not pale.  Lungs -- normal respiratory effort, no intercostal retractions, no accessory muscle use, and normal breath sounds.  Heart-- bradycardic, regular rhythm, no murmur.  Abdomen-- Not distended, good bowel sounds,soft,  non-tender. No rebound or rigidity. No mass,organomegaly @ abd or pelvis . Extremities-- no pretibial edema bilaterally  Neurologic--  alert & oriented X3. Speech normal, gait appropriate for age, strength symmetric and appropriate for age.  Psych-- Cognition and judgment appear intact. Cooperative with normal attention span and concentration. No anxious or depressed appearing.       Assessment & Plan:   Prediabetes, on diet control, check A1c, doing great with lifestyle  Hypothyroidism, due for a TSH  Hyperlipidemia, continue Pravachol, check FLP  Recent dysuria, check a UA and a urine culture

## 2014-10-13 NOTE — Assessment & Plan Note (Addendum)
Td  2008 pneumonia shot--declined   shingles approximately ~ 2007, declined zostavax  flu shot today, we agreed on regular dose   See previous entries: She declined further Pap smears or mammograms again today    Never had a cscope, declined to have one before and today. iFOB and cologuard -- discussed, declined   Doing great w/ diet-exercise discussed

## 2014-10-14 LAB — URINALYSIS, ROUTINE W REFLEX MICROSCOPIC
Bilirubin Urine: NEGATIVE
Hgb urine dipstick: NEGATIVE
Ketones, ur: NEGATIVE
Nitrite: POSITIVE — AB
RBC / HPF: NONE SEEN
Specific Gravity, Urine: 1.025
Total Protein, Urine: NEGATIVE
Urine Glucose: NEGATIVE
Urobilinogen, UA: 0.2
pH: 6 (ref 5.0–8.0)

## 2014-10-14 MED ORDER — CIPROFLOXACIN HCL 500 MG PO TABS: 500.0000 mg | ORAL_TABLET | Freq: Two times a day (BID) | ORAL | Status: DC

## 2014-10-14 NOTE — Assessment & Plan Note (Signed)
She is found to be quite bradycardic today. I am concern about a heart rate in the 30s, started a few months ago, she is completely asymptomatic, etiology unclear, she is not on beta blockers. We are checking a TSH today.  EKG-- bradycardia, sinus? Will discuss w/ cards  I strongly recommend a cardiology eval, risk of syncope   discussed, she declined. addendum EKG discuss with cardiology, they recommend a 24 hour Holter monitor and refer to EP, she has a high grade AV block and likely will be symptomatic soon. I discuss this new information with the patient and she is agreeable to the referrals

## 2014-10-16 LAB — URINE CULTURE

## 2014-10-19 ENCOUNTER — Other Ambulatory Visit (INDEPENDENT_AMBULATORY_CARE_PROVIDER_SITE_OTHER): Payer: Medicare Other

## 2014-10-19 DIAGNOSIS — R7303 Prediabetes: Secondary | ICD-10-CM

## 2014-10-19 DIAGNOSIS — E038 Other specified hypothyroidism: Secondary | ICD-10-CM

## 2014-10-19 DIAGNOSIS — E785 Hyperlipidemia, unspecified: Secondary | ICD-10-CM

## 2014-10-19 DIAGNOSIS — R7309 Other abnormal glucose: Secondary | ICD-10-CM

## 2014-10-19 DIAGNOSIS — R000 Tachycardia, unspecified: Secondary | ICD-10-CM

## 2014-10-19 LAB — COMPREHENSIVE METABOLIC PANEL
ALT: 13 U/L (ref 0–35)
AST: 27 U/L (ref 0–37)
Albumin: 4.1 g/dL (ref 3.5–5.2)
Alkaline Phosphatase: 69 U/L (ref 39–117)
BILIRUBIN TOTAL: 0.8 mg/dL (ref 0.2–1.2)
BUN: 30 mg/dL — ABNORMAL HIGH (ref 6–23)
CHLORIDE: 105 meq/L (ref 96–112)
CO2: 25 mEq/L (ref 19–32)
CREATININE: 0.8 mg/dL (ref 0.4–1.2)
Calcium: 9.4 mg/dL (ref 8.4–10.5)
GFR: 77.24 mL/min (ref >60.00)
Glucose, Bld: 82 mg/dL (ref 70–99)
Potassium: 4.1 mEq/L (ref 3.5–5.1)
Sodium: 138 mEq/L (ref 135–145)
Total Protein: 7.3 g/dL (ref 6.0–8.3)

## 2014-10-19 LAB — CBC WITH DIFFERENTIAL/PLATELET
Basophils Absolute: 0 10*3/uL (ref 0.0–0.1)
Basophils Relative: 0.4 % (ref 0.0–3.0)
EOS ABS: 0.2 10*3/uL (ref 0.0–0.7)
Eosinophils Relative: 2.5 % (ref 0.0–5.0)
HCT: 41.3 % (ref 36.0–46.0)
HEMOGLOBIN: 13.5 g/dL (ref 12.0–15.0)
LYMPHS ABS: 2.3 10*3/uL (ref 0.7–4.0)
Lymphocytes Relative: 29.9 % (ref 12.0–46.0)
MCHC: 32.7 g/dL (ref 30.0–36.0)
MCV: 91.4 fl (ref 78.0–100.0)
MONOS PCT: 6.8 % (ref 3.0–12.0)
Monocytes Absolute: 0.5 10*3/uL (ref 0.1–1.0)
NEUTROS ABS: 4.7 10*3/uL (ref 1.4–7.7)
Neutrophils Relative %: 60.4 % (ref 43.0–77.0)
Platelets: 238 10*3/uL (ref 150.0–400.0)
RBC: 4.52 Mil/uL (ref 3.87–5.11)
RDW: 14.3 % (ref 11.5–15.5)
WBC: 7.7 10*3/uL (ref 4.0–10.5)

## 2014-10-19 LAB — LIPID PANEL
CHOL/HDL RATIO: 3
Cholesterol: 193 mg/dL (ref 0–200)
HDL: 65 mg/dL (ref >39.00)
LDL CALC: 105 mg/dL — AB (ref 0–99)
NonHDL: 128
TRIGLYCERIDES: 113 mg/dL (ref 0.0–149.0)
VLDL: 22.6 mg/dL (ref 0.0–40.0)

## 2014-10-19 LAB — HEMOGLOBIN A1C: Hgb A1c MFr Bld: 6.2 % (ref 4.6–6.5)

## 2014-10-19 LAB — TSH: TSH: 3.58 u[IU]/mL (ref 0.35–4.50)

## 2014-10-20 ENCOUNTER — Telehealth: Payer: Self-pay | Admitting: Internal Medicine

## 2014-10-20 NOTE — Telephone Encounter (Signed)
Called patient to schedule 48hr monitor.  Per patient don't want to do right now.   Will discuss this with  Dr. Drue NovelPaz in 4 months.

## 2014-10-27 ENCOUNTER — Other Ambulatory Visit: Payer: Self-pay

## 2014-10-27 ENCOUNTER — Telehealth: Payer: Self-pay | Admitting: Internal Medicine

## 2014-10-27 MED ORDER — PRAVASTATIN SODIUM 80 MG PO TABS: 80.0000 mg | ORAL_TABLET | Freq: Every day | ORAL | Status: DC

## 2014-10-27 NOTE — Telephone Encounter (Signed)
As of today have not received any requests for Pravachol. [facility]

## 2014-10-27 NOTE — Telephone Encounter (Signed)
Caller name:Deniston Sienna Relation to EA:VWUJpt:self Call back number:[phone] Pharmacy:[facility] [address] [place]  Reason for call: pt is needing rx  pravastatin (PRAVACHOL) 80 MG tablet , states pharmacy has sent 2 request

## 2014-10-30 ENCOUNTER — Telehealth: Payer: Self-pay | Admitting: Internal Medicine

## 2014-10-30 NOTE — Telephone Encounter (Signed)
Pt erased your message regarding referral requesting you leave a detailed message.

## 2014-10-30 NOTE — Telephone Encounter (Signed)
done

## 2014-11-25 ENCOUNTER — Ambulatory Visit: Payer: Medicare Other | Admitting: Internal Medicine

## 2014-12-10 DIAGNOSIS — R000 Tachycardia, unspecified: Secondary | ICD-10-CM | POA: Diagnosis not present

## 2014-12-10 DIAGNOSIS — I441 Atrioventricular block, second degree: Secondary | ICD-10-CM | POA: Diagnosis not present

## 2014-12-25 ENCOUNTER — Telehealth: Payer: Self-pay | Admitting: Internal Medicine

## 2014-12-25 MED ORDER — LEVOTHYROXINE SODIUM 88 MCG PO TABS: ORAL_TABLET | ORAL | Status: DC

## 2014-12-25 NOTE — Telephone Encounter (Signed)
Caller name: Orel Relation to pt: self Call back number: [phone] [facility] [address]  Reason for call:   Requesting levothyroxine refill

## 2014-12-25 NOTE — Telephone Encounter (Signed)
Refilled to [facility] [address] as requested. Pt is due for F/U appt in April if she wants to make an appt.

## 2014-12-25 NOTE — Telephone Encounter (Signed)
Informed patient of medication refill and patient states that she will call back to schedule appointment

## 2015-04-15 ENCOUNTER — Other Ambulatory Visit: Payer: Self-pay

## 2015-04-15 ENCOUNTER — Other Ambulatory Visit: Payer: Self-pay | Admitting: Internal Medicine

## 2015-04-16 ENCOUNTER — Other Ambulatory Visit: Payer: Self-pay | Admitting: Internal Medicine

## 2015-04-22 ENCOUNTER — Telehealth: Payer: Self-pay | Admitting: Internal Medicine

## 2015-04-22 ENCOUNTER — Other Ambulatory Visit: Payer: Self-pay | Admitting: Internal Medicine

## 2015-04-22 MED ORDER — PRAVASTATIN SODIUM 80 MG PO TABS: 80.0000 mg | ORAL_TABLET | Freq: Every day | ORAL | Status: DC

## 2015-04-22 NOTE — Telephone Encounter (Signed)
Relation to WU:JWJXpt:self Call back number: [phone] Pharmacy: [id number] - [place], [place] - [address] [phone] (Phone) [phone] (Fax)         Reason for call:  Pt states as per pharmacy never received pravastatin (PRAVACHOL) 80 MG tablet on 04/15/15.

## 2015-04-22 NOTE — Telephone Encounter (Signed)
[facility], 30 day supply only. Please inform Pt that she is overdue for F/U appt. She will need to be seen before next refill.

## 2015-04-22 NOTE — Telephone Encounter (Signed)
LVM advising pt of MD instructions °

## 2015-05-10 ENCOUNTER — Encounter: Payer: Self-pay | Admitting: Internal Medicine

## 2015-05-10 ENCOUNTER — Ambulatory Visit (INDEPENDENT_AMBULATORY_CARE_PROVIDER_SITE_OTHER): Payer: Medicare Other | Admitting: Internal Medicine

## 2015-05-10 VITALS — BP 132/78 | HR 50 | Temp 98.1°F | Ht 67.0 in | Wt 155.0 lb

## 2015-05-10 DIAGNOSIS — R7309 Other abnormal glucose: Secondary | ICD-10-CM | POA: Diagnosis not present

## 2015-05-10 DIAGNOSIS — E039 Hypothyroidism, unspecified: Secondary | ICD-10-CM | POA: Diagnosis not present

## 2015-05-10 DIAGNOSIS — I498 Other specified cardiac arrhythmias: Secondary | ICD-10-CM | POA: Diagnosis not present

## 2015-05-10 DIAGNOSIS — R7303 Prediabetes: Secondary | ICD-10-CM

## 2015-05-10 LAB — HEMOGLOBIN A1C: HEMOGLOBIN A1C: 5.8 % (ref 4.6–6.5)

## 2015-05-10 LAB — TSH: TSH: 1.03 u[IU]/mL (ref 0.35–4.50)

## 2015-05-10 NOTE — Progress Notes (Signed)
Subjective:    Patient ID: Amy Weiss, female    DOB: 19-Nov-1935, 80 y.o.   MRN: [id number]  DOS:  05/10/2015 Type of visit - description : rov Interval history: In general feeling well, good compliance of medication. Was found to be bradycardic the last time she was here, she declined a Holter, currently asymptomatic. Labs reviewed, due for a TSH and A1c   Review of Systems She remains active, practices yoga 3 times a week. Denies chest pain, shortness of breath, palpitation or presyncope feelings. No headache or dizziness. "I feel really good".  Past Medical History  Diagnosis Date  . TIA (transient ischemic attack) 12/09  . Hypothyroidism   . Tachycardia     AV Re-entry, s/p ablation aprox 2005  . Hyperlipidemia   . Osteoporosis   . Diabetes mellitus 4/09    A1C-6  . Familial tremor     Past Surgical History  Procedure Laterality Date  . Abdominal hysterectomy      no oophorectomy  . Breast biopsy      L (-)  . Bladder surgery      x 2 in the 80s    History   Social History  . Marital Status: Divorced    Spouse Name: N/A  . Number of Children: 4  . Years of Education: N/A   Occupational History  . 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 Smoking status: Former Smoker    Quit date: 06/20/2012  . Smokeless tobacco: Never Used  . Alcohol Use: No  . Drug Use: Not on file  . Sexual Activity: Not on file   Other Topics Concern  . Not on file   Social History Narrative   Single, lives by herself                 Medication List       This list is accurate as of: 05/10/15 11:59 PM.  Always use your most recent med list.               aspirin 81 MG tablet  Take 162 mg by mouth daily.     CALTRATE 600 PLUS-VIT D PO  Take by mouth. 1 a day     levothyroxine 88 MCG tablet  Commonly known as:  SYNTHROID, LEVOTHROID  TAKE ONE TABLET BY MOUTH ONCE DAILY     multivitamin with minerals Tabs tablet    Take 1 tablet by mouth daily.     pravastatin 80 MG tablet  Commonly known as:  PRAVACHOL  Take 1 tablet (80 mg total) by mouth daily.           Objective:   Physical Exam BP 132/78 mmHg  Pulse 50  Temp(Src) 98.1 F (36.7 C) (Oral)  Ht 5\' 7"  (1.702 m)  Wt 155 lb (70.308 kg)  BMI 24.27 kg/m2  SpO2 97%  General:   Well developed, well nourished . NAD.  HEENT:  Normocephalic . Face symmetric, atraumatic Lungs:  CTA B Normal respiratory effort, no intercostal retractions, no accessory muscle use. Heart: Bradycardic,  no murmur.  No pretibial edema bilaterally  Skin: Not pale. Not jaundice Neurologic:  alert & oriented X3.  Speech normal, gait appropriate for age and unassisted Psych--  Cognition and judgment appear intact.  Cooperative with normal attention span and concentration.  Behavior appropriate. No anxious or depressed appearing.       Assessment & Plan:

## 2015-05-10 NOTE — Assessment & Plan Note (Signed)
Good compliance with Synthroid, check a TSH 

## 2015-05-10 NOTE — Assessment & Plan Note (Signed)
Has a healthy lifestyle, check A1c

## 2015-05-10 NOTE — Assessment & Plan Note (Addendum)
Since her last office visit, she declined a Holter monitor, she continues to be bradycardic but is asymptomatic. We agreed on observation, will call if dizziness, weakness or presyncope feeling.

## 2015-05-10 NOTE — Progress Notes (Signed)
Pre visit review using our clinic review tool, if applicable. No additional management support is needed unless otherwise documented below in the visit note. 

## 2015-05-10 NOTE — Patient Instructions (Signed)
Get your blood work before you leave    

## 2015-05-11 MED ORDER — PRAVASTATIN SODIUM 80 MG PO TABS: 80.0000 mg | ORAL_TABLET | Freq: Every day | ORAL | Status: DC

## 2015-05-11 MED ORDER — LEVOTHYROXINE SODIUM 88 MCG PO TABS: 88.0000 ug | ORAL_TABLET | Freq: Every day | ORAL | Status: DC

## 2015-05-11 NOTE — Addendum Note (Signed)
Addended by: Dorette Grate on: 05/11/2015 08:02 AM   Modules accepted: Orders

## 2015-08-24 ENCOUNTER — Telehealth: Payer: Self-pay | Admitting: Internal Medicine

## 2015-08-24 NOTE — Telephone Encounter (Signed)
Per Pt's chart, do not see where we have ever drawn a blood type on her. Most insurances will not pay due to not being a medical necessary.

## 2015-08-24 NOTE — Telephone Encounter (Signed)
Spoke with Pt, informed her that I do not see where we have ever typed her blood. Informed her if she would like at her office visit on 11/28 we can have her blood tested to see what type she is, but I did recommend she call her insurance to see if they will cover the test. Pt verbalized understanding.

## 2015-08-24 NOTE — Telephone Encounter (Signed)
Caller name:Mykaela Kasson Relationship to patient:self Can be reached:[phone] Pharmacy:  Reason for call:Requesting call back, wanting to know what blood type she is

## 2015-09-14 DIAGNOSIS — Z23 Encounter for immunization: Secondary | ICD-10-CM | POA: Diagnosis not present

## 2015-10-13 ENCOUNTER — Telehealth: Payer: Self-pay

## 2015-10-13 NOTE — Telephone Encounter (Signed)
Called patient for Pr-Visit information. States she did not have time to talk right now. Asked to return call if possible.

## 2015-10-18 ENCOUNTER — Ambulatory Visit (INDEPENDENT_AMBULATORY_CARE_PROVIDER_SITE_OTHER): Payer: Medicare Other | Admitting: Internal Medicine

## 2015-10-18 ENCOUNTER — Encounter: Payer: Self-pay | Admitting: Internal Medicine

## 2015-10-18 VITALS — BP 132/72 | HR 39 | Temp 98.1°F | Ht 67.0 in | Wt 155.2 lb

## 2015-10-18 DIAGNOSIS — E039 Hypothyroidism, unspecified: Secondary | ICD-10-CM

## 2015-10-18 DIAGNOSIS — E119 Type 2 diabetes mellitus without complications: Secondary | ICD-10-CM

## 2015-10-18 DIAGNOSIS — Z0000 Encounter for general adult medical examination without abnormal findings: Secondary | ICD-10-CM | POA: Diagnosis not present

## 2015-10-18 DIAGNOSIS — E785 Hyperlipidemia, unspecified: Secondary | ICD-10-CM | POA: Diagnosis not present

## 2015-10-18 DIAGNOSIS — Z09 Encounter for follow-up examination after completed treatment for conditions other than malignant neoplasm: Secondary | ICD-10-CM | POA: Insufficient documentation

## 2015-10-18 NOTE — Assessment & Plan Note (Addendum)
Td  2008 prevnar , pnm shot declined  Had shingles approximately ~ 2007, declined zostavax States had a flu shot already   See previous entries: She declined further Pap smears or mammograms again today  CCS:  declined again today  Doing great w/ diet-exercise discussed

## 2015-10-18 NOTE — Patient Instructions (Signed)
Get your blood work before you leave      Next visit  for a  Check up in 6 months, fasting    (15 minutes) Please schedule an appointment at the front desk      Fall Prevention and Home Safety Falls cause injuries and can affect all age groups. It is possible to use preventive measures to significantly decrease the likelihood of falls. There are many simple measures which can make your home safer and prevent falls. OUTDOORS  Repair cracks and edges of walkways and driveways.  Remove high doorway thresholds.  Trim shrubbery on the main path into your home.  Have good outside lighting.  Clear walkways of tools, rocks, debris, and clutter.  Check that handrails are not broken and are securely fastened. Both sides of steps should have handrails.  Have leaves, snow, and ice cleared regularly.  Use sand or salt on walkways during winter months.  In the garage, clean up grease or oil spills. BATHROOM  Install night lights.  Install grab bars by the toilet and in the tub and shower.  Use non-skid mats or decals in the tub or shower.  Place a plastic non-slip stool in the shower to sit on, if needed.  Keep floors dry and clean up all water on the floor immediately.  Remove soap buildup in the tub or shower on a regular basis.  Secure bath mats with non-slip, double-sided rug tape.  Remove throw rugs and tripping hazards from the floors. BEDROOMS  Install night lights.  Make sure a bedside light is easy to reach.  Do not use oversized bedding.  Keep a telephone by your bedside.  Have a firm chair with side arms to use for getting dressed.  Remove throw rugs and tripping hazards from the floor. KITCHEN  Keep handles on pots and pans turned toward the center of the stove. Use back burners when possible.  Clean up spills quickly and allow time for drying.  Avoid walking on wet floors.  Avoid hot utensils and knives.  Position shelves so they are not too high  or low.  Place commonly used objects within easy reach.  If necessary, use a sturdy step stool with a grab bar when reaching.  Keep electrical cables out of the way.  Do not use floor polish or wax that makes floors slippery. If you must use wax, use non-skid floor wax.  Remove throw rugs and tripping hazards from the floor. STAIRWAYS  Never leave objects on stairs.  Place handrails on both sides of stairways and use them. Fix any loose handrails. Make sure handrails on both sides of the stairways are as long as the stairs.  Check carpeting to make sure it is firmly attached along stairs. Make repairs to worn or loose carpet promptly.  Avoid placing throw rugs at the top or bottom of stairways, or properly secure the rug with carpet tape to prevent slippage. Get rid of throw rugs, if possible.  Have an electrician put in a light switch at the top and bottom of the stairs. OTHER FALL PREVENTION TIPS  Wear low-heel or rubber-soled shoes that are supportive and fit well. Wear closed toe shoes.  When using a stepladder, make sure it is fully opened and both spreaders are firmly locked. Do not climb a closed stepladder.  Add color or contrast paint or tape to grab bars and handrails in your home. Place contrasting color strips on first and last steps.  Learn and use mobility  aids as needed. Install an electrical emergency response system.  Turn on lights to avoid dark areas. Replace light bulbs that burn out immediately. Get light switches that glow.  Arrange furniture to create clear pathways. Keep furniture in the same place.  Firmly attach carpet with non-skid or double-sided tape.  Eliminate uneven floor surfaces.  Select a carpet pattern that does not visually hide the edge of steps.  Be aware of all pets. OTHER HOME SAFETY TIPS  Set the water temperature for 120 F (48.8 C).  Keep emergency numbers on or near the telephone.  Keep smoke detectors on every level of  the home and near sleeping areas. Document Released: 10/27/2002 Document Revised: 05/07/2012 Document Reviewed: 01/26/2012 [facility] Patient Information 2015 [place], [place]. This information is not intended to replace advice given to you by your health care provider. Make sure you discuss any questions you have with your health care provider.   Preventive Care for Adults Ages 60 and over  Blood pressure check.** / Every 1 to 2 years.  Lipid and cholesterol check.**/ Every 5 years beginning at age 30.  Lung cancer screening. / Every year if you are aged 55-80 years and have a 30-pack-year history of smoking and currently smoke or have quit within the past 15 years. Yearly screening is stopped once you have quit smoking for at least 15 years or develop a health problem that would prevent you from having lung cancer treatment.  Fecal occult blood test (FOBT) of stool. / Every year beginning at age 55 and continuing until age 2. You may not have to do this test if you get a colonoscopy every 10 years.  Flexible sigmoidoscopy** or colonoscopy.** / Every 5 years for a flexible sigmoidoscopy or every 10 years for a colonoscopy beginning at age 28 and continuing until age 64.  Hepatitis C blood test.** / For all people born from [birth year] through 1963 and any individual with known risks for hepatitis C.  Abdominal aortic aneurysm (AAA) screening.** / A one-time screening for ages 64 to 20 years who are current or former smokers.  Skin self-exam. / Monthly.  Influenza vaccine. / Every year.  Tetanus, diphtheria, and acellular pertussis (Tdap/Td) vaccine.** / 1 dose of Td every 10 years.  Varicella vaccine.** / Consult your health care provider.  Zoster vaccine.** / 1 dose for adults aged 56 years or older.  Pneumococcal 13-valent conjugate (PCV13) vaccine.** / Consult your health care provider.  Pneumococcal polysaccharide (PPSV23) vaccine.** / 1 dose for all adults aged 36 years and  older.  Meningococcal vaccine.** / Consult your health care provider.  Hepatitis A vaccine.** / Consult your health care provider.  Hepatitis B vaccine.** / Consult your health care provider.  Haemophilus influenzae type b (Hib) vaccine.** / Consult your health care provider. **Family history and personal history of risk and conditions may change your health care provider's recommendations. Document Released: 01/02/2002 Document Revised: 11/11/2013 Document Reviewed: 04/03/2011 [facility] Patient Information 2015 [place], [place]. This information is not intended to replace advice given to you by your health care provider. Make sure you discuss any questions you have with your health care provider.

## 2015-10-18 NOTE — Progress Notes (Signed)
Subjective:    Patient ID: Amy Weiss, female    DOB: 15-Nov-1935, 80 y.o.   MRN: [id number]  DOS:  10/18/2015 Type of visit - description :   Here for Medicare AWV:  1. Risk factors based on Past M, S, F history: reviewed 2. Physical Activities: Home chores, yard work, still working part time, practices yoga x 3/w   3. Depression/mood:  No problems noted or reported   4. Hearing:  No problems noted or reported   5. ADL's:  Independent , still drives   6. Fall Risk: no recent falls, see instructions    7. home Safety: does feelsafe at home   8. Height, weight, &visual acuity: see VS, does see her eye doctor regulalrly 9. Counseling: provided 10. Labs ordered based on risk factors: if needed   11. Referral Coordination: if needed 12.  Care Plan, see assessment and plan   13.   Cognitive Assessment: Motor skills and cognition  average for age   5. Care Team update 15.  End of life care discussed. MOST form provided   In addition, today we discussed the following: Prediabetes: Doing great with diet and exercise Hypothyroidism: Good compliance of medication Hyperlipidemia: on Pravachol, not fasting today.  Review of Systems Constitutional: No fever. No chills. No unexplained wt changes. No unusual sweats  HEENT: No dental problems, no ear discharge, no facial swelling, no voice changes. No eye discharge, no eye  redness , no  intolerance to light   Respiratory: No wheezing , no  difficulty breathing. No cough , no mucus production  Cardiovascular: No CP, no leg swelling , no  Palpitations  GI: no nausea, no vomiting, no diarrhea , no  abdominal pain.  No blood in the stools. No dysphagia, no odynophagia    Endocrine: No polyphagia, no polyuria , no polydipsia  GU: No dysuria, gross hematuria, difficulty urinating. No urinary urgency, no frequency.  Musculoskeletal: No joint swellings or unusual aches or pains  Skin: No change in the color of the skin, palor , no   Rash  Allergic, immunologic: No environmental allergies , no  food allergies  Neurological: No dizziness no  syncope. No headaches. No diplopia, no slurred, no slurred speech, no motor deficits, no facial  Numbness  Hematological: No enlarged lymph nodes, no easy bruising , no unusual bleedings  Psychiatry: No suicidal ideas, no hallucinations, no beavior problems, no confusion.  No unusual/severe anxiety, no depression   Past Medical History  Diagnosis Date  . TIA (transient ischemic attack) 12/09  . Hypothyroidism   . Tachycardia     AV Re-entry, s/p ablation aprox 2005  . Hyperlipidemia   . Osteoporosis   . Diabetes mellitus 4/09    A1C-6  . Familial tremor     Past Surgical History  Procedure Laterality Date  . Abdominal hysterectomy      no oophorectomy  . Breast biopsy      L (-)  . Bladder surgery      x 2 in the 62s    Social History   Social History  . Marital Status: Divorced    Spouse Name: N/A  . Number of Children: 4  . Years of Education: N/A   Occupational History  . 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 Smoking status: Former Smoker    Quit date: 06/20/2012  . Smokeless tobacco: Never Used  . Alcohol Use: No  .  Drug Use: Not on file  . Sexual Activity: Not on file   Other Topics Concern  . Not on file   Social History Narrative   Single, her son and his wife living w/ her (daughter in law very sick w/ cancer)           Family History  Problem Relation Age of Onset  . Breast cancer Neg Hx   . Colon cancer Neg Hx   . Pancreatic cancer Brother   . Pancreatic cancer Father   . Coronary artery disease Mother [id number]       Medication List       This list is accurate as of: 10/18/15  7:27 PM.  Always use your most recent med list.               aspirin 81 MG tablet  Take 162 mg by mouth daily.     CALTRATE 600 PLUS-VIT D PO  Take by mouth. 1 a day     ibuprofen 200 MG tablet  Commonly known  as:  ADVIL,MOTRIN  Take 200 mg by mouth 2 (two) times daily.     levothyroxine 88 MCG tablet  Commonly known as:  SYNTHROID, LEVOTHROID  Take 1 tablet (88 mcg total) by mouth daily before breakfast.     multivitamin with minerals Tabs tablet  Take 1 tablet by mouth daily.     pravastatin 80 MG tablet  Commonly known as:  PRAVACHOL  Take 1 tablet (80 mg total) by mouth daily.     senna 8.6 MG tablet  Commonly known as:  SENOKOT  Take 1 tablet by mouth daily.           Objective:   Physical Exam BP 132/72 mmHg  Pulse 39  Temp(Src) 98.1 F (36.7 C) (Oral)  Ht 5\' 7"  (1.702 m)  Wt 155 lb 4 oz (70.421 kg)  BMI 24.31 kg/m2  SpO2 99% General:   Well developed, well nourished . NAD.  HEENT:  Normocephalic . Face symmetric, atraumatic Neck: No thyromegaly, normal carotid pulses Lungs:  CTA B Normal respiratory effort, no intercostal retractions, no accessory muscle use. Heart: bradycardiac,  no murmur.  no pretibial edema bilaterally  Abdomen:  Not distended, soft, non-tender. No rebound or rigidity. No mass,organomegaly Skin: Not pale. Not jaundice Neurologic:  alert & oriented X3.  Speech normal, gait appropriate for age and unassisted Psych--  Cognition and judgment appear intact.  Cooperative with normal attention span and concentration.  Behavior appropriate. No anxious or depressed appearing.    Assessment & Plan:   Assessment > Prediabetes   dx 2009 >>>  A1c 6.0 Hypothyroidism TIA 2009 DJD- ibuprofen bid , no gi s/e, takes w/ food  Osteoporosis per dexa 2008, consistently declined retesting Familial tremor CV: [facility] ---AV reentry tachycardia, ablation 2005 ---Bradycardic   Plan: Prediabetes: Check A1c and microalbumin, she has an excellent lifestyle Hypothyroidism: Check a TSH Hyperlipidemia: Nonfasting, does not like to come back another day for labs, will check a CMP, FLP on return to the office. DJD: Takes ibuprofen twice a  day with no apparent side effects. Encouraged to take meds w/ food. Bradycardic: Continue asymptomatic, will see cardiology in few weeks.  RTC 6 months

## 2015-10-18 NOTE — Assessment & Plan Note (Signed)
Prediabetes: Check A1c and microalbumin, she has an excellent lifestyle Hypothyroidism: Check a TSH Hyperlipidemia: Nonfasting, does not like to come back another day for labs, will check a CMP, FLP on return to the office. DJD: Takes ibuprofen twice a day with no apparent side effects. Encouraged to take meds w/ food. Bradycardic: Continue asymptomatic, will see cardiology in few weeks.  RTC 6 months

## 2015-10-18 NOTE — Progress Notes (Signed)
Pre visit review using our clinic review tool, if applicable. No additional management support is needed unless otherwise documented below in the visit note. 

## 2015-10-19 LAB — BASIC METABOLIC PANEL
BUN: 26 mg/dL — ABNORMAL HIGH (ref 6–23)
CALCIUM: 9.4 mg/dL (ref 8.4–10.5)
CO2: 26 mEq/L (ref 19–32)
Chloride: 108 mEq/L (ref 96–112)
Creatinine, Ser: 0.72 mg/dL (ref 0.40–1.20)
GFR: 83.24 mL/min (ref >60.00)
Glucose, Bld: 77 mg/dL (ref 70–99)
Potassium: 4.2 mEq/L (ref 3.5–5.1)
Sodium: 142 mEq/L (ref 135–145)

## 2015-10-19 LAB — MICROALBUMIN / CREATININE URINE RATIO
CREATININE, U: 124.5 mg/dL
MICROALB UR: 1.7 mg/dL (ref 0.0–1.9)
Microalb Creat Ratio: 1.4 mg/g (ref 0.0–30.0)

## 2015-10-19 LAB — TSH: TSH: 1.11 u[IU]/mL (ref 0.35–4.50)

## 2015-10-19 LAB — HEMOGLOBIN A1C: HEMOGLOBIN A1C: 5.9 % (ref 4.6–6.5)

## 2015-10-19 LAB — AST: AST: 20 U/L (ref 0–37)

## 2015-10-19 LAB — ALT: ALT: 10 U/L (ref 0–35)

## 2015-11-13 ENCOUNTER — Other Ambulatory Visit: Payer: Self-pay | Admitting: Internal Medicine

## 2015-12-22 ENCOUNTER — Other Ambulatory Visit: Payer: Self-pay | Admitting: Internal Medicine

## 2016-04-29 ENCOUNTER — Other Ambulatory Visit: Payer: Self-pay

## 2016-07-11 DIAGNOSIS — Z23 Encounter for immunization: Secondary | ICD-10-CM | POA: Diagnosis not present

## 2016-08-09 ENCOUNTER — Other Ambulatory Visit: Payer: Self-pay | Admitting: Internal Medicine

## 2016-09-21 ENCOUNTER — Telehealth: Payer: Self-pay | Admitting: Internal Medicine

## 2016-09-21 NOTE — Telephone Encounter (Signed)
Immunization record updated. Pt is due for CPE after 10/18/2016. Please schedule at Pt's convenience. Thank you.

## 2016-09-21 NOTE — Telephone Encounter (Signed)
Pt says that she received a call in regards to her flu shot. Pt says that she had her flu shot about 2 months ago at the [facility] [place], [place]

## 2016-09-22 NOTE — Telephone Encounter (Signed)
lvm for pt, advising to schedule cpe at her earliest convenience.

## 2016-09-27 ENCOUNTER — Other Ambulatory Visit: Payer: Self-pay | Admitting: Internal Medicine

## 2016-11-05 ENCOUNTER — Other Ambulatory Visit: Payer: Self-pay | Admitting: Internal Medicine

## 2016-11-06 ENCOUNTER — Other Ambulatory Visit: Payer: Self-pay

## 2016-11-06 MED ORDER — PRAVASTATIN SODIUM 80 MG PO TABS: 80.0000 mg | ORAL_TABLET | Freq: Every day | ORAL | 0 refills | Status: DC

## 2016-11-16 ENCOUNTER — Telehealth: Payer: Self-pay | Admitting: Internal Medicine

## 2016-11-16 NOTE — Telephone Encounter (Signed)
Medicare wellness appointment scheduled for 2pm Friday 12/09/15 with the nurse, will there be any nurse available to conduct, please advise? Patient does have a physical scheduled with PCP at 3pm just a FYI.

## 2016-11-21 NOTE — Telephone Encounter (Signed)
She could do AWV w/ Angel at [employer] or schedule AWV for another day. [facility] and Angel leaves at 2pm.

## 2016-11-22 NOTE — Telephone Encounter (Signed)
Patient returned call and confirmed medicare wellness appointment with Nurse for 12/09/15 at 1pm and CPE with PCP right after.

## 2016-11-22 NOTE — Telephone Encounter (Signed)
lvm advising patient of message below °

## 2016-12-07 ENCOUNTER — Other Ambulatory Visit: Payer: Self-pay | Admitting: Internal Medicine

## 2016-12-08 ENCOUNTER — Encounter: Payer: Medicare Other | Admitting: Internal Medicine

## 2017-01-01 NOTE — Progress Notes (Signed)
Subjective:   Amy Weiss is a 82 y.o. female who presents for Medicare Annual (Subsequent) preventive examination.  Review of Systems:  No ROS.  Medicare Wellness Visit.  Cardiac Risk Factors include: dyslipidemia;advanced age (>6355men, 49>65 women);hypertension  Sleep patterns: no sleep issues, feels rested on waking and gets up 0-1 times nightly to void.   Home Safety/Smoke Alarms: Feels safe in home. Smoke alarms in place.  Living environment; residence and Firearm Safety: Lives alone. 1-story house/ trailer, firearms stored safely. Seat Belt Safety/Bike Helmet: Wears seat belt.   Counseling:   Eye Exam- Follows w/ eye doctor yearly-due for appointment Dental- [place]/ [place]. Partial plates upper and lower.  Female:   Pap- Aged out Mammo- Aged out      Dexa scan- last 05/02/07. Osteoporosis.  CCS- Never    Objective:     Vitals: BP (!) 158/68   Pulse (!) 44   Resp 16   Ht 5\' 7"  (1.702 m)   Wt 146 lb (66.2 kg)   SpO2 99%   BMI 22.87 kg/m   Body mass index is 22.87 kg/m.   BP Readings from Last 3 Encounters:  01/02/17 (!) 158/68  10/18/15 132/72  05/10/15 132/78   Pulse Readings from Last 3 Encounters:  01/02/17 (!) 44  10/18/15 (!) 39  05/10/15 (!) 50   Tobacco History  Smoking Status  . Former Smoker  . Quit date: 06/20/2012  Smokeless Tobacco  . Never Used     Counseling given: Not Answered   Past Medical History:  Diagnosis Date  . Diabetes mellitus 4/09   A1C-6  . Familial tremor   . Hyperlipidemia   . Hypothyroidism   . Osteoporosis   . Tachycardia    AV Re-entry, s/p ablation aprox 2005  . TIA (transient ischemic attack) 12/09   Past Surgical History:  Procedure Laterality Date  . ABDOMINAL HYSTERECTOMY     no oophorectomy  . BLADDER SURGERY     x 2 in the 80s  . BREAST BIOPSY     L (-)   Family History  Problem Relation Age of Onset  . Pancreatic cancer Brother   . Pancreatic cancer Father   . Coronary  artery disease Mother [id number]  . Breast cancer Neg Hx   . Colon cancer Neg Hx    History  Sexual Activity  . Sexual activity: No    Outpatient Encounter Prescriptions as of 01/02/2017  Medication Sig  . aspirin 81 MG tablet Take 162 mg by mouth daily.  . Calcium-Vitamin D (CALTRATE 600 PLUS-VIT D PO) Take by mouth. 1 a day  . ibuprofen (ADVIL,MOTRIN) 200 MG tablet Take 200 mg by mouth 2 (two) times daily as needed.   Marland Kitchen. levothyroxine (SYNTHROID, LEVOTHROID) 88 MCG tablet Take 1 tablet (88 mcg total) by mouth daily before breakfast.  . Multiple Vitamin (MULITIVITAMIN WITH MINERALS) TABS Take 1 tablet by mouth daily.  . pravastatin (PRAVACHOL) 80 MG tablet Take 1 tablet (80 mg total) by mouth daily.  Marland Kitchen. senna (SENOKOT) 8.6 MG tablet Take 1 tablet by mouth daily as needed.   . TURMERIC PO Take 1 tablet by mouth daily.   No facility-administered encounter medications on file as of 01/02/2017.     Activities of Daily Living In your present state of health, do you have any difficulty performing the following activities: 01/02/2017  Hearing? N  Vision? N  Difficulty concentrating or making decisions? N  Walking or climbing stairs?  N  Dressing or bathing? N  Doing errands, shopping? N  Preparing Food and eating ? N  Using the Toilet? N  In the past six months, have you accidently leaked urine? N  Do you have problems with loss of bowel control? N  Managing your Medications? N  Managing your Finances? N  Housekeeping or managing your Housekeeping? N  Some recent data might be hidden    Patient Care Team: [name], MD as PCP - General Elijah Bailey Mech, MD as Referring Physician (Cardiology)    Assessment:    Physical assessment deferred to PCP.  Exercise Activities and Dietary recommendations Current Exercise Habits: Structured exercise class, Type of exercise: walking;yoga, Frequency (Times/Week): 3  Diet (meal preparation, eat out, water intake, caffeinated beverages, dairy  products, fruits and vegetables): in general, a "healthy" diet  , on average, 1-2 meals per day. Reports decreased appetite over time. Able to prepare own meals. Snacks throughout the day. Drinks water throughout the day.  Goals    . Healthy Lifestyle          Continue to eat heart healthy diet (full of fruits, vegetables, whole grains, lean protein, water--limit salt, fat, and sugar intake) and increase physical activity as tolerated. Continue doing brain stimulating activities (puzzles, reading, adult coloring books, staying active) to keep memory sharp.       Fall Risk Fall Risk  01/02/2017 10/18/2015 10/13/2014 10/10/2013  Falls in the past year? No No No No   Depression Screen PHQ 2/9 Scores 01/02/2017 10/18/2015 10/13/2014 10/10/2013  PHQ - 2 Score 0 0 0 0     Cognitive Function MMSE - Mini Mental State Exam 01/02/2017  Orientation to time 5  Orientation to Place 5  Registration 3  Attention/ Calculation 4  Recall 2  Language- name 2 objects 2  Language- repeat 1  Language- follow 3 step command 3  Language- read & follow direction 1  Write a sentence 1  Copy design 1  Total score 28        Immunization History  Administered Date(s) Administered  . Influenza Split 08/15/2012  . Influenza, High Dose Seasonal PF 08/20/2013  . Influenza-Unspecified 09/14/2015, 07/11/2016  . Td 04/02/2007   Screening Tests Health Maintenance  Topic Date Due  . URINE MICROALBUMIN  10/17/2016  . PNA vac Low Risk Adult (1 of 2 - PCV13) 01/02/2018 (Originally 09/14/2002)  . TETANUS/TDAP  04/01/2017  . INFLUENZA VACCINE  Completed  . DEXA SCAN  Completed  . ZOSTAVAX  Completed      Plan:    Follow-up w/ PCP as scheduled.  Declines pneumococcal vaccination, colon CA screening, and bone density. She does not articulate any particular reason-"I just don't want it."  Bring a copy of your advance directives to your next office visit.  During the course of the visit the patient was  educated and counseled about the following appropriate screening and preventive services:   Vaccines to include Pneumoccal, Influenza, Hepatitis B, Td, Zostavax, HCV  Cardiovascular Disease  Colorectal cancer screening  Bone density screening  Diabetes screening  Glaucoma screening  Nutrition counseling   Patient Instructions (the written plan) was given to the patient.   Starla Link, RN  01/02/2017  Willow Ora, MD

## 2017-01-01 NOTE — Progress Notes (Signed)
Pre visit review using our clinic review tool, if applicable. No additional management support is needed unless otherwise documented below in the visit note. 

## 2017-01-02 ENCOUNTER — Ambulatory Visit: Payer: Medicare Other | Admitting: *Deleted

## 2017-01-02 ENCOUNTER — Encounter: Payer: Self-pay | Admitting: Internal Medicine

## 2017-01-02 ENCOUNTER — Encounter: Payer: Medicare Other | Admitting: Internal Medicine

## 2017-01-02 ENCOUNTER — Ambulatory Visit (INDEPENDENT_AMBULATORY_CARE_PROVIDER_SITE_OTHER): Payer: Medicare Other | Admitting: Internal Medicine

## 2017-01-02 VITALS — BP 144/78 | HR 44 | Resp 16 | Ht 67.0 in | Wt 146.0 lb

## 2017-01-02 DIAGNOSIS — R739 Hyperglycemia, unspecified: Secondary | ICD-10-CM | POA: Diagnosis not present

## 2017-01-02 DIAGNOSIS — Z0000 Encounter for general adult medical examination without abnormal findings: Secondary | ICD-10-CM

## 2017-01-02 NOTE — Assessment & Plan Note (Addendum)
Td  2008 - declined booster Prevnar , pnm shot declined  Had shingles approximately ~ 2007, declined zostavax  had a flu shot already   See previous entries: declined firther colon-breast-cervical ca screening   Doing great w/ diet-exercise discussed  Labs: BMP, AST, ALT, FLP, CBC, A1c

## 2017-01-02 NOTE — Progress Notes (Signed)
Subjective:    Patient ID: Amy Weiss, female    DOB: 1935-11-20, 82 y.o.   MRN: [id number]  DOS:  01/02/2017 Type of visit - description : CPX Interval history:  In general feeling well, no major concerns, diet continued to be very healthy. She remains active.  Review of Systems  Constitutional: No fever. No chills. No unexplained wt changes. No unusual sweats  HEENT: No dental problems, no ear discharge, no facial swelling, no voice changes. No eye discharge, no eye  redness , no  intolerance to light   Respiratory: No wheezing , no  difficulty breathing. No cough , no mucus production  Cardiovascular: Had chest pain, transient, completely gone at this point. See triage note. She has chronic bradycardia, no orthostatic symptoms.  GI: no nausea, no vomiting, no diarrhea , no  abdominal pain.  No blood in the stools. No dysphagia, no odynophagia    Endocrine: No polyphagia, no polyuria , no polydipsia  GU: No dysuria, gross hematuria, difficulty urinating. No urinary urgency, no frequency.  Musculoskeletal: No joint swellings or unusual aches or pains  Skin: No change in the color of the skin, palor , no  Rash  Allergic, immunologic: No environmental allergies , no  food allergies  Neurological: No dizziness no  syncope. No headaches. No diplopia, no slurred, no slurred speech, no motor deficits, no facial  Numbness  Hematological: No enlarged lymph nodes, no easy bruising , no unusual bleedings  Psychiatry: No suicidal ideas, no hallucinations, no beavior problems, no confusion.  No unusual/severe anxiety, no depression    Past Medical History:  Diagnosis Date  . Diabetes mellitus 4/09   A1C-6  . Familial tremor   . Hyperlipidemia   . Hypothyroidism   . Osteoporosis   . Tachycardia    AV Re-entry, s/p ablation aprox 2005  . TIA (transient ischemic attack) 12/09    Past Surgical History:  Procedure Laterality Date  . ABDOMINAL HYSTERECTOMY     no  oophorectomy  . BLADDER SURGERY     x 2 in the 80s  . BREAST BIOPSY     L (-)    Social History   Social History  . Marital status: Divorced    Spouse name: N/A  . Number of children: 4  . Years of education: N/A   Occupational History  . 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 Smoking status: Former Smoker    Quit date: 06/20/2012  . Smokeless tobacco: Never Used  . Alcohol use No  . Drug use: No  . Sexual activity: No   Other Topics Concern  . Not on file   Social History Narrative   Single, her son and his wife living w/ her (daughter in law very sick w/ cancer)           Family History  Problem Relation Age of Onset  . Pancreatic cancer Brother   . Pancreatic cancer Father   . Coronary artery disease Mother [id number]  . Breast cancer Neg Hx   . Colon cancer Neg Hx      Allergies as of 01/02/2017      Reactions   Sulfonamide Derivatives Other (See Comments)   Unknown      Medication List       Accurate as of 01/02/17  3:30 PM. Always use your most recent med list.          aspirin 81 MG  tablet Take 162 mg by mouth daily.   CALTRATE 600 PLUS-VIT D PO Take by mouth. 1 a day   ibuprofen 200 MG tablet Commonly known as:  ADVIL,MOTRIN Take 200 mg by mouth 2 (two) times daily as needed.   levothyroxine 88 MCG tablet Commonly known as:  SYNTHROID, LEVOTHROID Take 1 tablet (88 mcg total) by mouth daily before breakfast.   multivitamin with minerals Tabs tablet Take 1 tablet by mouth daily.   pravastatin 80 MG tablet Commonly known as:  PRAVACHOL Take 1 tablet (80 mg total) by mouth daily.   senna 8.6 MG tablet Commonly known as:  SENOKOT Take 1 tablet by mouth daily as needed.   TURMERIC PO Take 1 tablet by mouth daily.          Objective:   Physical Exam BP (!) 158/68   Pulse (!) 44   Resp 16   Ht 5\' 7"  (1.702 m)   Wt 146 lb (66.2 kg)   SpO2 99%   BMI 22.87 kg/m   General:   Well developed, well  nourished . NAD.  Neck: No  thyromegaly  HEENT:  Normocephalic . Face symmetric, atraumatic Lungs:  CTA B Normal respiratory effort, no intercostal retractions, no accessory muscle use. Heart: RRR,  no murmur.  No pretibial edema bilaterally  Abdomen:  Not distended, soft, non-tender. No rebound or rigidity.   Skin: Exposed areas without rash. Not pale. Not jaundice Neurologic:   alert & oriented X3. MMSE 28  Speech normal, gait appropriate for age and unassisted Strength symmetric and appropriate for age.  Psych: Cognition and judgment appear intact.  Cooperative with normal attention span and concentration.  Behavior appropriate. No anxious or depressed appearing.    Assessment & Plan:   Assessment > Prediabetes   dx 2009 >>>  A1c 6.0 Hypothyroidism TIA 2009 DJD- ibuprofen bid , no gi s/e, takes w/ food  Osteoporosis per dexa 2008, consistently declined retesting Familial tremor CV: see cardiology at [facility] ---AV reentry tachycardia, ablation 2005 ---Bradycardic , Mobitz I , junctional escapes -- saw cards @ [facility], f/u 1 year, pt decided not to go back  Plan: Prediabetes: Check a A1c Elevated BP: BP today was a slightly elevated, recheck was 144/78. Amb BPs: ranges from 55/77 to  120s/70. I advised patient that a systolic of 55 is almost unheard of unless the pt is in shock,, she is sure that is what she is getting. She is completely asymptomatic when the BPs in the low side. Recommend to continue monitoring BPs Hypothyroidism: On Synthroid, check a TSH Bradycardia: Chronic issue, asx, denies orthostatic sx. Recently called cardiology and reported of chest pain, states that the pain is gone and it was very short-lived.   Declined further eval today, "I will be back if needed" RTC one year

## 2017-01-02 NOTE — Patient Instructions (Addendum)
Bring a copy of your advance directives to your next office visit. Schedule an appointment with your eye doctor for a routine eye exam at your convenience.  ==================  GO TO THE LAB : Get the blood work     GO TO THE FRONT DESK Schedule your next appointment for a  Physical rexam and a medicare wellness in 1 year     Check the  blood pressure 2 or 3 times a  Week  Be sure your blood pressure is between 110/65 and  145/85. If it is consistently higher or lower, let me know   If you have chest pain, please go to the ER

## 2017-01-03 ENCOUNTER — Encounter: Payer: Medicare Other | Admitting: Internal Medicine

## 2017-01-03 LAB — CBC WITH DIFFERENTIAL/PLATELET
Basophils Absolute: 0.1 10*3/uL (ref 0.0–0.1)
Basophils Relative: 0.7 % (ref 0.0–3.0)
EOS ABS: 0.4 10*3/uL (ref 0.0–0.7)
Eosinophils Relative: 4.3 % (ref 0.0–5.0)
HEMATOCRIT: 42.7 % (ref 36.0–46.0)
Hemoglobin: 14 g/dL (ref 12.0–15.0)
LYMPHS PCT: 28 % (ref 12.0–46.0)
Lymphs Abs: 2.3 10*3/uL (ref 0.7–4.0)
MCHC: 32.8 g/dL (ref 30.0–36.0)
MCV: 90.9 fl (ref 78.0–100.0)
Monocytes Absolute: 1 10*3/uL (ref 0.1–1.0)
Monocytes Relative: 12 % (ref 3.0–12.0)
Neutro Abs: 4.6 10*3/uL (ref 1.4–7.7)
Neutrophils Relative %: 55 % (ref 43.0–77.0)
PLATELETS: 245 10*3/uL (ref 150.0–400.0)
RBC: 4.7 Mil/uL (ref 3.87–5.11)
RDW: 14.1 % (ref 11.5–15.5)
WBC: 8.3 10*3/uL (ref 4.0–10.5)

## 2017-01-03 LAB — ALT: ALT: 10 U/L (ref 0–35)

## 2017-01-03 LAB — AST: AST: 21 U/L (ref 0–37)

## 2017-01-03 LAB — BASIC METABOLIC PANEL
BUN: 25 mg/dL — ABNORMAL HIGH (ref 6–23)
CALCIUM: 9.6 mg/dL (ref 8.4–10.5)
CO2: 29 mEq/L (ref 19–32)
Chloride: 107 mEq/L (ref 96–112)
Creatinine, Ser: 0.65 mg/dL (ref 0.40–1.20)
GFR: 93.38 mL/min (ref >60.00)
GLUCOSE: 85 mg/dL (ref 70–99)
POTASSIUM: 4.3 meq/L (ref 3.5–5.1)
Sodium: 142 mEq/L (ref 135–145)

## 2017-01-03 LAB — LIPID PANEL
CHOL/HDL RATIO: 2
CHOLESTEROL: 163 mg/dL (ref 0–200)
HDL: 70.5 mg/dL (ref >39.00)
LDL CALC: 77 mg/dL (ref 0–99)
NonHDL: 92.45
Triglycerides: 78 mg/dL (ref 0.0–149.0)
VLDL: 15.6 mg/dL (ref 0.0–40.0)

## 2017-01-03 LAB — TSH: TSH: 0.35 u[IU]/mL (ref 0.35–4.50)

## 2017-01-03 LAB — HEMOGLOBIN A1C: Hgb A1c MFr Bld: 5.9 % (ref 4.6–6.5)

## 2017-01-03 NOTE — Assessment & Plan Note (Signed)
Prediabetes: Check a A1c Elevated BP: BP today was a slightly elevated, recheck was 144/78. Amb BPs: ranges from 55/77 to  120s/70. I advised patient that a systolic of 55 is almost unheard of unless the pt is in shock,, she is sure that is what she is getting. She is completely asymptomatic when the BPs in the low side. Recommend to continue monitoring BPs Hypothyroidism: On Synthroid, check a TSH Bradycardia: Chronic issue, asx, denies orthostatic sx. Recently called cardiology and reported of chest pain, states that the pain is gone and it was very short-lived.   Declined further eval today, "I will be back if needed" RTC one year

## 2017-01-07 ENCOUNTER — Other Ambulatory Visit: Payer: Self-pay | Admitting: Internal Medicine

## 2017-08-27 DIAGNOSIS — H5203 Hypermetropia, bilateral: Secondary | ICD-10-CM | POA: Diagnosis not present

## 2017-09-17 ENCOUNTER — Telehealth: Payer: Self-pay | Admitting: Internal Medicine

## 2017-09-17 NOTE — Telephone Encounter (Signed)
Flu shot received on 08/17/2017 per medication reconciliation tab. Immunization record updated.

## 2017-09-17 NOTE — Telephone Encounter (Signed)
FYI- Patient called to inform us that she received her Flu shot. Thank you

## 2018-01-09 ENCOUNTER — Other Ambulatory Visit: Payer: Self-pay | Admitting: Internal Medicine

## 2018-01-11 ENCOUNTER — Other Ambulatory Visit: Payer: Self-pay | Admitting: Internal Medicine

## 2018-02-10 ENCOUNTER — Other Ambulatory Visit: Payer: Self-pay | Admitting: Internal Medicine

## 2018-03-11 ENCOUNTER — Other Ambulatory Visit: Payer: Self-pay | Admitting: Internal Medicine

## 2018-03-14 ENCOUNTER — Other Ambulatory Visit: Payer: Self-pay | Admitting: Internal Medicine

## 2018-03-18 ENCOUNTER — Telehealth: Payer: Self-pay | Admitting: Internal Medicine

## 2018-03-18 NOTE — Telephone Encounter (Signed)
Left message for pt to call back to schedule appt before 04/10/18. Pt will need to be seen at appt before further refills on the requested medications can be given. Pt was given a 15 day supply of medication on 4/22 and 4/25 and will need to be seen in the office prior to 03/26/18.

## 2018-03-18 NOTE — Telephone Encounter (Signed)
Copied from CRM [id number]. Topic: Inquiry >> Mar 18, 2018  8:32 AM Yvonna Alanis wrote: Reason for CRM: Patient called requesting refills of her medications Levothyroxine (SYNTHROID, LEVOTHROID) 88 MCG tablet and Pravastatin (PRAVACHOL) 80 MG tablet. Patient's preferred pharmacy is [id number] - [place], [place] - [address] [phone] (Phone)  [phone] (Fax). Patient would like a call back from someone at the office.

## 2018-03-24 ENCOUNTER — Other Ambulatory Visit: Payer: Self-pay | Admitting: Internal Medicine

## 2018-03-25 ENCOUNTER — Telehealth: Payer: Self-pay | Admitting: Internal Medicine

## 2018-03-25 NOTE — Telephone Encounter (Signed)
Copied from CRM [id number]. Topic: Quick Communication - Rx Refill/Question >> Mar 25, 2018  6:57 AM Gerrianne Scale wrote: Medication: levothyroxine (SYNTHROID, LEVOTHROID) 88 MCG tablet pravastatin (PRAVACHOL) 80 MG tablet   Has the patient contacted their pharmacy? Yes.   (Agent: If no, request that the patient contact the pharmacy for the refill.) Preferred Pharmacy (with phone number or street name): [id number] - [place], [place] - [address] [phone] (Phone) [phone] (Fax)     Agent: Please be advised that RX refills may take up to 3 business days. We ask that you follow-up with your pharmacy.

## 2018-03-25 NOTE — Telephone Encounter (Signed)
Filled on 03/25/18

## 2018-04-08 ENCOUNTER — Other Ambulatory Visit: Payer: Self-pay | Admitting: Internal Medicine

## 2018-04-08 ENCOUNTER — Other Ambulatory Visit: Payer: Self-pay

## 2018-04-08 ENCOUNTER — Telehealth: Payer: Self-pay | Admitting: Internal Medicine

## 2018-04-08 MED ORDER — PRAVASTATIN SODIUM 80 MG PO TABS: 80.0000 mg | ORAL_TABLET | Freq: Every day | ORAL | 0 refills | Status: DC

## 2018-04-08 NOTE — Telephone Encounter (Signed)
Copied from CRM [id number]. Topic: Quick Communication - Rx Refill/Question >> Apr 08, 2018  9:14 AM Rudi Coco, NT wrote: Medication: pravastatin (PRAVACHOL) 80 MG tablet [[id number]]   Has the patient contacted their pharmacy? yes (Agent: If no, request that the patient contact the pharmacy for the refill.) (Agent: If yes, when and what did the pharmacy advise?)  Preferred Pharmacy (with phone number or street name): [id number] - [place], [place] - [address] [place] [address] Phone: [phone] Fax: [phone]    Agent: Please be advised that RX refills may take up to 3 business days. We ask that you follow-up with your pharmacy.

## 2018-04-09 ENCOUNTER — Ambulatory Visit: Payer: Medicare Other | Admitting: Internal Medicine

## 2018-04-09 ENCOUNTER — Encounter: Payer: Self-pay | Admitting: Internal Medicine

## 2018-04-09 ENCOUNTER — Ambulatory Visit (INDEPENDENT_AMBULATORY_CARE_PROVIDER_SITE_OTHER): Payer: Medicare Other | Admitting: Internal Medicine

## 2018-04-09 VITALS — BP 142/72 | HR 40 | Temp 98.2°F | Ht 67.0 in | Wt 146.1 lb

## 2018-04-09 DIAGNOSIS — Z0000 Encounter for general adult medical examination without abnormal findings: Secondary | ICD-10-CM

## 2018-04-09 LAB — COMPREHENSIVE METABOLIC PANEL
ALBUMIN: 3.8 g/dL (ref 3.5–5.2)
ALT: 8 U/L (ref 0–35)
AST: 18 U/L (ref 0–37)
Alkaline Phosphatase: 77 U/L (ref 39–117)
BILIRUBIN TOTAL: 0.7 mg/dL (ref 0.2–1.2)
BUN: 21 mg/dL (ref 6–23)
CALCIUM: 9.4 mg/dL (ref 8.4–10.5)
CO2: 27 mEq/L (ref 19–32)
CREATININE: 0.69 mg/dL (ref 0.40–1.20)
Chloride: 107 mEq/L (ref 96–112)
GFR: 86.88 mL/min (ref >60.00)
Glucose, Bld: 79 mg/dL (ref 70–99)
Potassium: 4.7 mEq/L (ref 3.5–5.1)
Sodium: 142 mEq/L (ref 135–145)
Total Protein: 7.1 g/dL (ref 6.0–8.3)

## 2018-04-09 LAB — CBC WITH DIFFERENTIAL/PLATELET
Basophils Absolute: 0 10*3/uL (ref 0.0–0.1)
Basophils Relative: 0.5 % (ref 0.0–3.0)
EOS PCT: 1.8 % (ref 0.0–5.0)
Eosinophils Absolute: 0.2 10*3/uL (ref 0.0–0.7)
HEMATOCRIT: 41.9 % (ref 36.0–46.0)
Hemoglobin: 13.7 g/dL (ref 12.0–15.0)
LYMPHS ABS: 2.2 10*3/uL (ref 0.7–4.0)
LYMPHS PCT: 24.6 % (ref 12.0–46.0)
MCHC: 32.7 g/dL (ref 30.0–36.0)
MCV: 86.6 fl (ref 78.0–100.0)
MONOS PCT: 8.3 % (ref 3.0–12.0)
Monocytes Absolute: 0.8 10*3/uL (ref 0.1–1.0)
NEUTROS ABS: 5.9 10*3/uL (ref 1.4–7.7)
NEUTROS PCT: 64.8 % (ref 43.0–77.0)
PLATELETS: 236 10*3/uL (ref 150.0–400.0)
RBC: 4.84 Mil/uL (ref 3.87–5.11)
RDW: 14.2 % (ref 11.5–15.5)
WBC: 9.1 10*3/uL (ref 4.0–10.5)

## 2018-04-09 LAB — LIPID PANEL
CHOLESTEROL: 169 mg/dL (ref 0–200)
HDL: 52.7 mg/dL (ref >39.00)
LDL Cholesterol: 92 mg/dL (ref 0–99)
NonHDL: 115.86
Total CHOL/HDL Ratio: 3
Triglycerides: 121 mg/dL (ref 0.0–149.0)
VLDL: 24.2 mg/dL (ref 0.0–40.0)

## 2018-04-09 LAB — TSH: TSH: 0.36 u[IU]/mL (ref 0.35–4.50)

## 2018-04-09 LAB — HEMOGLOBIN A1C: Hgb A1c MFr Bld: 6 % (ref 4.6–6.5)

## 2018-04-09 NOTE — Patient Instructions (Signed)
GO TO THE LAB : Get the blood work     GO TO THE FRONT DESK Schedule your next appointment   for a physical exam in 1 year 

## 2018-04-09 NOTE — Assessment & Plan Note (Addendum)
-  Td  2008 - declined booster and all other immunizations, benefits discussed - See previous entries: declined further colon-breast-cervical ca screening (benefits discussed again, better outcomes w/ early detection, etc).  Also declined a bone density test. -Lifestyle: She remains very active, lives by herself, has family around, no depression.   - Labs: CMP, FLP, CBC, A1c, TSH

## 2018-04-09 NOTE — Progress Notes (Signed)
Subjective:    Patient ID: Amy Weiss, female    DOB: [birth year], 83 y.o.   MRN: [id number]  DOS:  04/09/2018 Type of visit - description : Physical exam Interval history: In general feeling well.  Denies any problems.   Review of Systems He remains active, take walks frequently, practices yoga. She is bradycardic, denies chest pain, dizziness, shortness of breath. Denies stroke symptoms such as diplopia, slurred speech, LOC. Denies vaginal bleeding or discharge  Other than above, a 14 point review of systems is negative      Past Medical History:  Diagnosis Date  . Diabetes mellitus 4/09   A1C-6  . Familial tremor   . Hyperlipidemia   . Hypothyroidism   . Osteoporosis   . Tachycardia    AV Re-entry, s/p ablation aprox 2005  . TIA (transient ischemic attack) 12/09    Past Surgical History:  Procedure Laterality Date  . ABDOMINAL HYSTERECTOMY     no oophorectomy  . BLADDER SURGERY     x 2 in the 80s  . BREAST BIOPSY     L (-)    Social History   Socioeconomic History  . Marital status: Divorced    Spouse name: Not on file  . Number of children: 4  . Years of education: Not on file  . Highest education level: Not on file  Occupational History  . 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 Financial resource strain: Not on file  . Food insecurity:    Worry: Not on file    Inability: Not on file  . Transportation needs:    Medical: Not on file    Non-medical: Not on file  Tobacco Use  . Smoking status: Former Smoker    Last attempt to quit: 06/20/2012    Years since quitting: 5.8  . Smokeless tobacco: Never Used  Substance and Sexual Activity  . Alcohol use: Yes    Comment: beer rarely  . Drug use: No  . Sexual activity: Never  Lifestyle  . Physical activity:    Days per week: Not on file    Minutes per session: Not on file  . Stress: Not on file  Relationships  . Social connections:    Talks on phone: Not on file      Gets together: Not on file    Attends religious service: Not on file    Active member of club or organization: Not on file    Attends meetings of clubs or organizations: Not on file    Relationship status: Not on file  . Intimate partner violence:    Fear of current or ex partner: Not on file    Emotionally abused: Not on file    Physically abused: Not on file    Forced sexual activity: Not on file  Other Topics Concern  . Not on file  Social History Narrative   Single, live by herself, lost a daughter in law   [name] lives close by   Ex- husband lives near     Family History  Problem Relation Age of Onset  . Pancreatic cancer Brother   . Pancreatic cancer Father   . Coronary artery disease Mother 52  . Breast cancer Neg Hx   . Colon cancer Neg Hx      Allergies as of 04/09/2018      Reactions   Sulfonamide Derivatives Other (See Comments)   Unknown  Medication List        Accurate as of 04/09/18 11:59 PM. Always use your most recent med list.          aspirin 81 MG tablet Take 162 mg by mouth daily.   CALTRATE 600 PLUS-VIT D PO Take by mouth. 1 a day   levothyroxine 88 MCG tablet Commonly known as:  SYNTHROID, LEVOTHROID Take 1 tablet (88 mcg total) by mouth daily before breakfast.   multivitamin with minerals Tabs tablet Take 1 tablet by mouth daily.   pravastatin 80 MG tablet Commonly known as:  PRAVACHOL Take 1 tablet (80 mg total) by mouth daily.   senna 8.6 MG tablet Commonly known as:  SENOKOT Take 1 tablet by mouth daily as needed.   TURMERIC PO Take 1 tablet by mouth daily.          Objective:   Physical Exam BP (!) 142/72 (BP Location: Left Arm, Patient Position: Sitting, Cuff Size: Normal)   Pulse (!) 40   Temp 98.2 F (36.8 C) (Oral)   Ht  (1.702 m)   Wt 146 lb 2 oz (66.3 kg)   SpO2 98%   BMI 22.89 kg/m   General:   Well developed, well nourished . NAD.  Neck: No  thyromegaly  HEENT:  Normocephalic .  Face symmetric, atraumatic Lungs:  CTA B Normal respiratory effort, no intercostal retractions, no accessory muscle use. Heart: bradychardic,  no murmur.  No pretibial edema bilaterally  Abdomen:  Not distended, soft, non-tender. No rebound or rigidity.   Skin: Exposed areas without rash. Not pale. Not jaundice Neurologic:  alert & oriented X3.  Speech normal, gait appropriate for age and unassisted Strength symmetric and appropriate for age.  Psych: Cognition and judgment appear intact.  Cooperative with normal attention span and concentration.  Behavior appropriate. No anxious or depressed appearing.     Assessment & Plan:    Assessment > Prediabetes   dx 2009 >>>  A1c 6.0 Hypothyroidism TIA 2009 DJD  Osteoporosis per dexa 2008, consistently declined retesting Familial tremor CV: see cardiology at [facility] ---AV reentry tachycardia, ablation 2005 ---Bradycardic , Mobitz I , junctional escapes -- saw cards @ [facility], f/u 1 year, pt decided not to go back  Plan:  Prediabetes: Checking A1c Elevated BP, seems a stable, no change. Hypothyroidism: Checking a TSH.  Recheck in 1 year or sooner depending on results. Bradycardia: She remains asymptomatic, does not see urology regularly. RTC 1 year

## 2018-04-09 NOTE — Progress Notes (Signed)
Pre visit review using our clinic review tool, if applicable. No additional management support is needed unless otherwise documented below in the visit note. 

## 2018-04-10 ENCOUNTER — Ambulatory Visit: Payer: Medicare Other | Admitting: Internal Medicine

## 2018-04-10 NOTE — Assessment & Plan Note (Signed)
Prediabetes: Checking A1c Elevated BP, seems a stable, no change. Hypothyroidism: Checking a TSH.  Recheck in 1 year or sooner depending on results. Bradycardia: She remains asymptomatic, does not see urology regularly. RTC 1 year

## 2018-04-11 ENCOUNTER — Ambulatory Visit: Payer: Medicare Other | Admitting: Family Medicine

## 2018-04-18 ENCOUNTER — Other Ambulatory Visit: Payer: Self-pay | Admitting: Internal Medicine

## 2018-05-01 ENCOUNTER — Ambulatory Visit: Payer: Medicare Other | Admitting: Internal Medicine

## 2019-01-22 ENCOUNTER — Other Ambulatory Visit: Payer: Self-pay | Admitting: Internal Medicine

## 2019-04-11 ENCOUNTER — Encounter: Payer: Medicare Other | Admitting: Internal Medicine

## 2019-04-28 ENCOUNTER — Other Ambulatory Visit: Payer: Self-pay

## 2019-04-28 MED ORDER — LEVOTHYROXINE SODIUM 88 MCG PO TABS: 88.0000 ug | ORAL_TABLET | Freq: Every day | ORAL | 1 refills | Status: DC

## 2019-06-19 ENCOUNTER — Encounter: Payer: Self-pay | Admitting: Internal Medicine

## 2019-07-10 ENCOUNTER — Other Ambulatory Visit: Payer: Self-pay

## 2019-07-10 ENCOUNTER — Ambulatory Visit (INDEPENDENT_AMBULATORY_CARE_PROVIDER_SITE_OTHER): Payer: Medicare Other | Admitting: Internal Medicine

## 2019-07-10 DIAGNOSIS — Z20822 Contact with and (suspected) exposure to covid-19: Secondary | ICD-10-CM

## 2019-07-10 DIAGNOSIS — R05 Cough: Secondary | ICD-10-CM | POA: Diagnosis not present

## 2019-07-10 DIAGNOSIS — R509 Fever, unspecified: Secondary | ICD-10-CM

## 2019-07-10 DIAGNOSIS — Z20828 Contact with and (suspected) exposure to other viral communicable diseases: Secondary | ICD-10-CM | POA: Diagnosis not present

## 2019-07-10 DIAGNOSIS — L03119 Cellulitis of unspecified part of limb: Secondary | ICD-10-CM | POA: Diagnosis not present

## 2019-07-10 DIAGNOSIS — R6889 Other general symptoms and signs: Secondary | ICD-10-CM | POA: Diagnosis not present

## 2019-07-10 MED ORDER — DOXYCYCLINE HYCLATE 100 MG PO TABS: 100.0000 mg | ORAL_TABLET | Freq: Two times a day (BID) | ORAL | 0 refills | Status: DC

## 2019-07-10 MED ORDER — HYDROCORTISONE 2.5 % EX CREA: TOPICAL_CREAM | Freq: Two times a day (BID) | CUTANEOUS | 0 refills | Status: DC

## 2019-07-10 NOTE — Progress Notes (Signed)
Subjective:    Patient ID: Amy Weiss, female    DOB: [birth year], 84 y.o.   MRN: [id number]  DOS:  07/10/2019 Type of visit - description: Virtual Visit via Video Note  I connected with@   by a video enabled telemedicine application and verified that I am speaking with the correct person using two identifiers.   THIS ENCOUNTER IS A VIRTUAL VISIT DUE TO COVID-19 - PATIENT WAS NOT SEEN IN THE OFFICE. PATIENT HAS CONSENTED TO VIRTUAL VISIT / TELEMEDICINE VISIT   Location of patient: home  Location of provider: office  I discussed the limitations of evaluation and management by telemedicine and the availability of in person appointments. The patient expressed understanding and agreed to proceed.  History of Present Illness: Acute visit.  Here with her daughter who assisted us. Initially the main concern was a rash but the daughter is telling me that the patient developed a fever yesterday of 101.9, temperature today 99.0. In addition to the fever she has mild cough. The patient actually denies all of the above.  As far as the rash, it is started 3 months ago, is the left lower extremity, is very itchy and she scratches constantly. The lower extremities are slightly swelling on and off, around the rash in the last few days he has been a little swollen and warm according to the patient's daughter.     Review of Systems  Had runny nose yesterday, not today.  No sore throat No nausea, vomiting, diarrhea. No chest congestion No dysuria, gross hematuria or stomach pain No lack of taste or smell  Past Medical History:  Diagnosis Date  . Diabetes mellitus 4/09   A1C-6  . Familial tremor   . Hyperlipidemia   . Hypothyroidism   . Osteoporosis   . Tachycardia    AV Re-entry, s/p ablation aprox 2005  . TIA (transient ischemic attack) 12/09    Past Surgical History:  Procedure Laterality Date  . ABDOMINAL HYSTERECTOMY     no oophorectomy  . BLADDER SURGERY     x 2 in the  80s  . BREAST BIOPSY     L (-)    Social History   Socioeconomic History  . Marital status: Divorced    Spouse name: Not on file  . Number of children: 4  . Years of education: Not on file  . Highest education level: Not on file  Occupational History  . 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 Financial resource strain: Not on file  . Food insecurity    Worry: Not on file    Inability: Not on file  . Transportation needs    Medical: Not on file    Non-medical: Not on file  Tobacco Use  . Smoking status: Former Smoker    Quit date: 06/20/2012    Years since quitting: 7.0  . Smokeless tobacco: Never Used  Substance and Sexual Activity  . Alcohol use: Yes    Comment: beer rarely  . Drug use: No  . Sexual activity: Never  Lifestyle  . Physical activity    Days per week: Not on file    Minutes per session: Not on file  . Stress: Not on file  Relationships  . Social Musicianconnections    Talks on phone: Not on file    Gets together: Not on file    Attends religious service: Not on file    Active member of club or organization:  Not on file    Attends meetings of clubs or organizations: Not on file    Relationship status: Not on file  . Intimate partner violence    Fear of current or ex partner: Not on file    Emotionally abused: Not on file    Physically abused: Not on file    Forced sexual activity: Not on file  Other Topics Concern  . Not on file  Social History Narrative   Single, live by herself, lost a daughter in law   Daughter Fleet ContrasRachel lives close by   Ex- husband lives near      Allergies as of 07/10/2019      Reactions   Sulfonamide Derivatives Other (See Comments)   Unknown      Medication List       Accurate as of July 10, 2019 11:59 PM. If you have any questions, ask your nurse or doctor.        aspirin 81 MG tablet Take 162 mg by mouth daily.   CALTRATE 600 PLUS-VIT D PO Take by mouth. 1 a day   doxycycline 100 MG  tablet Commonly known as: VIBRA-TABS Take 1 tablet (100 mg total) by mouth 2 (two) times daily. Started by: Willow OraJose Ankush Gintz, MD   hydrocortisone 2.5 % cream Apply topically 2 (two) times daily. Started by: Willow OraJose Castiel Lauricella, MD   levothyroxine 88 MCG tablet Commonly known as: SYNTHROID Take 1 tablet (88 mcg total) by mouth daily before breakfast.   multivitamin with minerals Tabs tablet Take 1 tablet by mouth daily.   pravastatin 80 MG tablet Commonly known as: PRAVACHOL Take 1 tablet (80 mg total) by mouth daily.   senna 8.6 MG tablet Commonly known as: SENOKOT Take 1 tablet by mouth daily as needed.   TURMERIC PO Take 1 tablet by mouth daily.           Objective:   Physical Exam Musculoskeletal:       Feet:    There were no vitals taken for this visit. This is a virtual visit, the patient is alert oriented x3, she is sitting in her porch in no distress.  I was able to see her left lower extremity through the camera. See graphic.  She has some varicose veins    Assessment      Assessment > Prediabetes   dx 2009 >>>  A1c 6.0 Hypothyroidism TIA 2009 DJD  Osteoporosis per dexa 2008, consistently declined retesting Familial tremor [facility]: [facility] ---AV reentry tachycardia, ablation 2005 ---Bradycardic , Mobitz I , junctional escapes -- [facility], f/u 1 year, pt decided not to go back  Plan:  Rash: I told the patient the limitations of a virtual evaluation however I suspect has developed cellulitis around a area of eczema or stasis dermatitis. Plan: Leg elevation, doxycycline, hydrocortisone 2.5%, reassess in 2 or 3 weeks Fever Could be related to the rash however she also has a cough and I have to suspect COVID-19. She does not seem to be in distress. She had a free testing at [facility] yesterday,  will take 10 days to come back. Plan: [facility], rest, drink plenty of fluids, check a temperature twice a day, Tylenol as  needed, Robitussin as needed.  Most important monitor symptoms, ER if severe symptoms, chest pain, shortness of breath. The patient is reluctant to my advice but the daughter verbalized understanding.    I discussed the assessment and treatment plan with  the patient. The patient was provided an opportunity to ask questions and all were answered. The patient agreed with the plan and demonstrated an understanding of the instructions.   The patient was advised to call back or seek an in-person evaluation if the symptoms worsen or if the condition fails to improve as anticipated.

## 2019-07-11 LAB — NOVEL CORONAVIRUS, NAA: SARS-CoV-2, NAA: NOT DETECTED

## 2019-07-12 NOTE — Assessment & Plan Note (Signed)
Rash: I told the patient the limitations of a virtual evaluation however I suspect has developed cellulitis around a area of eczema or stasis dermatitis. Plan: Leg elevation, doxycycline, hydrocortisone 2.5%, reassess in 2 or 3 weeks Fever Could be related to the rash however she also has a cough and I have to suspect COVID-19. She does not seem to be in distress. She had a free testing at [facility] yesterday,  will take 10 days to come back. Plan: Testing at the [facility] facility, rest, drink plenty of fluids, check a temperature twice a day, Tylenol as needed, Robitussin as needed.  Most important monitor symptoms, ER if severe symptoms, chest pain, shortness of breath. The patient is reluctant to my advice but the daughter verbalized understanding.

## 2019-08-11 ENCOUNTER — Other Ambulatory Visit: Payer: Self-pay | Admitting: *Deleted

## 2019-08-11 MED ORDER — LEVOTHYROXINE SODIUM 88 MCG PO TABS: 88.0000 ug | ORAL_TABLET | Freq: Every day | ORAL | 1 refills | Status: DC

## 2019-09-22 DIAGNOSIS — Z0000 Encounter for general adult medical examination without abnormal findings: Secondary | ICD-10-CM | POA: Diagnosis not present

## 2019-09-22 DIAGNOSIS — Z8639 Personal history of other endocrine, nutritional and metabolic disease: Secondary | ICD-10-CM | POA: Diagnosis not present

## 2019-09-22 DIAGNOSIS — E039 Hypothyroidism, unspecified: Secondary | ICD-10-CM | POA: Diagnosis not present

## 2019-09-22 DIAGNOSIS — R001 Bradycardia, unspecified: Secondary | ICD-10-CM | POA: Diagnosis not present

## 2019-11-18 ENCOUNTER — Ambulatory Visit (INDEPENDENT_AMBULATORY_CARE_PROVIDER_SITE_OTHER): Payer: Medicare Other | Admitting: Internal Medicine

## 2019-11-18 ENCOUNTER — Other Ambulatory Visit: Payer: Self-pay

## 2019-11-18 VITALS — Temp 97.0°F

## 2019-11-18 DIAGNOSIS — R21 Rash and other nonspecific skin eruption: Secondary | ICD-10-CM

## 2019-11-18 DIAGNOSIS — M545 Low back pain, unspecified: Secondary | ICD-10-CM

## 2019-11-18 MED ORDER — HYDROCORTISONE 2.5 % EX CREA: TOPICAL_CREAM | Freq: Two times a day (BID) | CUTANEOUS | 0 refills | Status: DC

## 2019-11-18 MED ORDER — CYCLOBENZAPRINE HCL 10 MG PO TABS: 10.0000 mg | ORAL_TABLET | Freq: Every evening | ORAL | 0 refills | Status: DC | PRN

## 2019-11-18 NOTE — Progress Notes (Signed)
Subjective:    Patient ID: Amy Weiss, female    DOB: [birth year], 84 y.o.   MRN: [id number]  DOS:  11/18/2019 Type of visit - description: Virtual Visit via Video Note  I connected with the above patient  by a video enabled telemedicine application and verified that I am speaking with the correct person using two identifiers.   THIS ENCOUNTER IS A VIRTUAL VISIT DUE TO COVID-19 - PATIENT WAS NOT SEEN IN THE OFFICE. PATIENT HAS CONSENTED TO VIRTUAL VISIT / TELEMEDICINE VISIT   Location of patient: home  Location of provider: office  I discussed the limitations of evaluation and management by telemedicine and the availability of in person appointments. The patient expressed understanding and agreed to proceed.  History of Present Illness: Acute The patient is here today with her daughter [name]. 2 days ago, she was picking up her Christmas tree and suddenly developed bilateral lower back pain. There was no radiation, lower extremity paresthesia, motor deficits or bladder or bowel incontinence.  The first day the pain was so intense she could not get up from bed. Yesterday she felt a little better and took a shower Today the pain is not gone but decreased and she has been able to get up and do a little walking. They are using naproxen, ice heat.    Review of Systems  See above  Past Medical History:  Diagnosis Date  . Diabetes mellitus 4/09   A1C-6  . Familial tremor   . Hyperlipidemia   . Hypothyroidism   . Osteoporosis   . Tachycardia    AV Re-entry, s/p ablation aprox 2005  . TIA (transient ischemic attack) 12/09    Past Surgical History:  Procedure Laterality Date  . ABDOMINAL HYSTERECTOMY     no oophorectomy  . BLADDER SURGERY     x 2 in the 80s  . BREAST BIOPSY     L (-)    Social History   Socioeconomic History  . Marital status: Divorced    Spouse name: Not on file  . Number of children: 4  . Years of education: Not on file  . Highest education  level: Not on file  Occupational History  . 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 Smoking status: Former Smoker    Quit date: 06/20/2012    Years since quitting: 7.4  . Smokeless tobacco: Never Used  Substance and Sexual Activity  . Alcohol use: Yes    Comment: beer rarely  . Drug use: No  . Sexual activity: Never  Other Topics Concern  . Not on file  Social History Narrative   Single, live by herself, lost a daughter in law   [name] lives close by   Ex- husband lives near   Social Determinants of Health   Financial Resource Strain:   . Difficulty of Paying Living Expenses: Not on file  Food Insecurity:   . Worried About Programme researcher, broadcasting/film/video in the Last Year: Not on file  . Ran Out of Food in the Last Year: Not on file  Transportation Needs:   . Lack of Transportation (Medical): Not on file  . Lack of Transportation (Non-Medical): Not on file  Physical Activity:   . Days of Exercise per Week: Not on file  . Minutes of Exercise per Session: Not on file  Stress:   . Feeling of Stress : Not on file  Social Connections:   . Frequency of  Communication with Friends and Family: Not on file  . Frequency of Social Gatherings with Friends and Family: Not on file  . Attends Religious Services: Not on file  . Active Member of Clubs or Organizations: Not on file  . Attends Archivist Meetings: Not on file  . Marital Status: Not on file  Intimate Partner Violence:   . Fear of Current or Ex-Partner: Not on file  . Emotionally Abused: Not on file  . Physically Abused: Not on file  . Sexually Abused: Not on file      Allergies as of 11/18/2019      Reactions   Sulfonamide Derivatives Other (See Comments)   Unknown      Medication List       Accurate as of November 18, 2019  3:18 PM. If you have any questions, ask your nurse or doctor.        STOP taking these medications   doxycycline 100 MG tablet Commonly known as:  VIBRA-TABS Stopped by: Kathlene November, MD     TAKE these medications   aspirin 81 MG tablet Take 162 mg by mouth daily.   CALTRATE 600 PLUS-VIT D PO Take by mouth. 1 a day   hydrocortisone 2.5 % cream Apply topically 2 (two) times daily.   levothyroxine 88 MCG tablet Commonly known as: SYNTHROID Take 1 tablet (88 mcg total) by mouth daily before breakfast.   multivitamin with minerals Tabs tablet Take 1 tablet by mouth daily.   pravastatin 80 MG tablet Commonly known as: PRAVACHOL Take 1 tablet (80 mg total) by mouth daily.   senna 8.6 MG tablet Commonly known as: SENOKOT Take 1 tablet by mouth daily as needed.   TURMERIC PO Take 1 tablet by mouth daily.           Objective:   Physical Exam Temp (!) 97 F (36.1 C) (Oral)  This is a virtual video visit, Amy Weiss is resting in a soft, no distress, alert oriented x3, she seems in good spirits.  Her daughter [name] is with her.    Assessment      Assessment > Prediabetes   dx 2009 >>>  A1c 6.0 Hypothyroidism TIA 2009 DJD  Osteoporosis per dexa 2008, consistently declined retesting Familial tremor CV: see cardiology at [facility] ---AV reentry tachycardia, ablation 2005 ---Bradycardic , Mobitz I , junctional escapes -- saw cards @ [facility], f/u 1 year, pt decided not to go back  PLAN Back pain: As described above, started 2 days ago while picking up her Christmas tree.  Most likely sprain, she has a history of osteoporosis, vertebral fracture?. Patient strongly declined a x-ray now but will agree if she is not improving in the next few days Plan: Rest, stop NSAIDs, use Tylenol as needed for pain, continue ice/heat, trial with Flexeril for bedtime use, watch for excessive sedation. Call if not gradually improving for x-ray. The patient and her daughter [name] verbalized understanding of instructions Rash: See last visit, improved, still have a couple of scabs, request refill on hydrocortisone, sent.    I discussed  the assessment and treatment plan with the patient. The patient was provided an opportunity to ask questions and all were answered. The patient agreed with the plan and demonstrated an understanding of the instructions.   The patient was advised to call back or seek an in-person evaluation if the symptoms worsen or if the condition fails to improve as anticipated.

## 2019-11-19 NOTE — Assessment & Plan Note (Signed)
Back pain: As described above, started 2 days ago while picking up her Christmas tree.  Most likely sprain, she has a history of osteoporosis, vertebral fracture?. Patient strongly declined a x-ray now but will agree if she is not improving in the next few days Plan: Rest, stop NSAIDs, use Tylenol as needed for pain, continue ice/heat, trial with Flexeril for bedtime use, watch for excessive sedation. Call if not gradually improving for x-ray. The patient and [name] verbalized understanding of instructions Rash: See last visit, improved, still have a couple of scabs, request refill on hydrocortisone, sent.

## 2019-11-25 ENCOUNTER — Ambulatory Visit (INDEPENDENT_AMBULATORY_CARE_PROVIDER_SITE_OTHER): Payer: Medicare HMO | Admitting: Family Medicine

## 2019-11-25 ENCOUNTER — Other Ambulatory Visit: Payer: Self-pay

## 2019-11-25 ENCOUNTER — Encounter: Payer: Self-pay | Admitting: Family Medicine

## 2019-11-25 ENCOUNTER — Ambulatory Visit (HOSPITAL_BASED_OUTPATIENT_CLINIC_OR_DEPARTMENT_OTHER)
Admission: RE | Admit: 2019-11-25 | Discharge: 2019-11-25 | Disposition: A | Discharge status: Home / Self Care | Payer: Medicare HMO | Source: Ambulatory Visit | Attending: Family Medicine | Admitting: Family Medicine

## 2019-11-25 VITALS — BP 145/87 | HR 50 | Ht 67.0 in | Wt 140.0 lb

## 2019-11-25 DIAGNOSIS — M545 Low back pain, unspecified: Secondary | ICD-10-CM

## 2019-11-25 MED ORDER — BACLOFEN 10 MG PO TABS: 5.0000 mg | ORAL_TABLET | Freq: Two times a day (BID) | ORAL | 0 refills | Status: DC | PRN

## 2019-11-25 MED ORDER — METHYLPREDNISOLONE ACETATE 40 MG/ML IJ SUSP
40.0000 mg | Freq: Once | INTRAMUSCULAR | Status: AC
  Administered 2019-11-25: 15:00:00 40 mg via INTRAMUSCULAR

## 2019-11-25 MED ORDER — KETOROLAC TROMETHAMINE 30 MG/ML IJ SOLN
30.0000 mg | Freq: Once | INTRAMUSCULAR | Status: AC
  Administered 2019-11-25: 15:00:00 30 mg via INTRAMUSCULAR

## 2019-11-25 NOTE — Patient Instructions (Signed)
Nice to meet you Please try heat  Please try the stretches  Please try the baclofen.  Please try Aspercreme and the lidocaine or over-the-counter Voltaren gel I will call with results from today. Please send me a message in MyChart with any questions or updates.  Please see me back in 3 to 4 weeks or sooner if needed.   --Dr. Jordan Likes

## 2019-11-25 NOTE — Progress Notes (Signed)
Amy Weiss - 85 y.o. female MRN [id number]  Date of birth: 06-04-1935  SUBJECTIVE:  Including CC & ROS.  Chief Complaint  Patient presents with  . Back Injury    midline low back x 11/16/2019    Amy Weiss is a 85 y.o. female that is presenting with acute lower back pain.  She was picking up a Christmas tree and the pain suddenly occurred.  The pain is been constant and severe.  She is unable to walk without severe pain.  Unable to transition from sitting to standing without severe pain.  Localized to lower back.  No radicular symptoms.  No history of similar symptoms.  No improvement with medications thus far.  No numbness or tingling.  No radicular symptoms.   Review of Systems See HPI   HISTORY: Past Medical, Surgical, Social, and Family History Reviewed & Updated per EMR.   Pertinent Historical Findings include:  Past Medical History:  Diagnosis Date  . Diabetes mellitus 4/09   A1C-6  . Familial tremor   . Hyperlipidemia   . Hypothyroidism   . Osteoporosis   . Tachycardia    AV Re-entry, s/p ablation aprox 2005  . TIA (transient ischemic attack) 12/09    Past Surgical History:  Procedure Laterality Date  . ABDOMINAL HYSTERECTOMY     no oophorectomy  . BLADDER SURGERY     x 2 in the 80s  . BREAST BIOPSY     L (-)    Allergies  Allergen Reactions  . Sulfonamide Derivatives Other (See Comments)    Unknown    Family History  Problem Relation Age of Onset  . Pancreatic cancer Brother   . Pancreatic cancer Father   . Coronary artery disease Mother 30  . Breast cancer Neg Hx   . Colon cancer Neg Hx      Social History   Socioeconomic History  . Marital status: Divorced    Spouse name: Not on file  . Number of children: 4  . Years of education: Not on file  . Highest education level: Not on file  Occupational History  . 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 Smoking status: Former Smoker    Quit date: 06/20/2012   Years since quitting: 7.4  . Smokeless tobacco: Never Used  Substance and Sexual Activity  . Alcohol use: Yes    Comment: beer rarely  . Drug use: No  . Sexual activity: Never  Other Topics Concern  . Not on file  Social History Narrative   Single, live by herself, lost a daughter in law   [name] lives close by   Ex- husband lives near   Social Determinants of Health   Financial Resource Strain:   . Difficulty of Paying Living Expenses: Not on file  Food Insecurity:   . Worried About Programme researcher, broadcasting/film/video in the Last Year: Not on file  . Ran Out of Food in the Last Year: Not on file  Transportation Needs:   . Lack of Transportation (Medical): Not on file  . Lack of Transportation (Non-Medical): Not on file  Physical Activity:   . Days of Exercise per Week: Not on file  . Minutes of Exercise per Session: Not on file  Stress:   . Feeling of Stress : Not on file  Social Connections:   . Frequency of Communication with Friends and Family: Not on file  . Frequency of Social Gatherings with Friends  and Family: Not on file  . Attends Religious Services: Not on file  . Active Member of Clubs or Organizations: Not on file  . Attends Archivist Meetings: Not on file  . Marital Status: Not on file  Intimate Partner Violence:   . Fear of Current or Ex-Partner: Not on file  . Emotionally Abused: Not on file  . Physically Abused: Not on file  . Sexually Abused: Not on file     PHYSICAL EXAM:  VS: BP (!) 145/87   Pulse (!) 50   Ht 5\' 7"  (1.702 m)   Wt 140 lb (63.5 kg)   BMI 21.93 kg/m  Physical Exam Gen: NAD, alert, cooperative with exam, well-appearing ENT: normal lips, normal nasal mucosa,  Eye: normal EOM, normal conjunctiva and lids CV:  no edema, +2 pedal pulses   Resp: no accessory muscle use, non-labored,   Skin: no rashes, no areas of induration  Neuro: normal tone, normal sensation to touch Psych:  normal insight, alert and oriented MSK:  Back:    Limited exam secondary to pain. Tenderness to palpation of the paraspinal muscles just proximal to the SI joints. Limited flexion extension. Normal strength resistance with hip flexion. Normal strength resistance with knee flexion and extension. Normal strength resistance with plantarflexion and dorsiflexion. Negative straight leg raise. Neurovascularly intact     ASSESSMENT & PLAN:   Acute bilateral low back pain without sciatica Acute in nature.  Seems more likely spasm than compression fracture. -Intramuscular Depo-Medrol and Toradol. -X-ray. -Counseled on home exercise therapy and supportive care. -May need to consider home health physical therapy. -Baclofen and discontinue Flexeril.

## 2019-11-26 ENCOUNTER — Telehealth: Payer: Self-pay | Admitting: Family Medicine

## 2019-11-26 DIAGNOSIS — S32010A Wedge compression fracture of first lumbar vertebra, initial encounter for closed fracture: Secondary | ICD-10-CM

## 2019-11-26 DIAGNOSIS — M545 Low back pain, unspecified: Secondary | ICD-10-CM | POA: Insufficient documentation

## 2019-11-26 MED ORDER — HYDROCODONE-ACETAMINOPHEN 5-325 MG PO TABS: 1.0000 | ORAL_TABLET | Freq: Three times a day (TID) | ORAL | 0 refills | Status: DC | PRN

## 2019-11-26 NOTE — Telephone Encounter (Signed)
Spoke with daughter about her mother's x-ray results.  Shows compression fracture of unknown duration.  Was seen yesterday for severe back pain.  Will provide Rollator.  Will send home health physical therapy.  Will provide Norco but counseled on it should only be used when she is supervised.  Myra Rude, MD [facility] 11/26/2019, 10:26 AM

## 2019-11-26 NOTE — Assessment & Plan Note (Signed)
Acute in nature.  Seems more likely spasm than compression fracture. -Intramuscular Depo-Medrol and Toradol. -X-ray. -Counseled on home exercise therapy and supportive care. -May need to consider home health physical therapy. -Baclofen and discontinue Flexeril.

## 2019-11-28 ENCOUNTER — Other Ambulatory Visit: Payer: Self-pay | Admitting: Family Medicine

## 2019-11-28 DIAGNOSIS — S32010A Wedge compression fracture of first lumbar vertebra, initial encounter for closed fracture: Secondary | ICD-10-CM

## 2019-11-28 DIAGNOSIS — Z87891 Personal history of nicotine dependence: Secondary | ICD-10-CM | POA: Diagnosis not present

## 2019-11-28 DIAGNOSIS — Z9181 History of falling: Secondary | ICD-10-CM | POA: Diagnosis not present

## 2019-11-28 DIAGNOSIS — E119 Type 2 diabetes mellitus without complications: Secondary | ICD-10-CM | POA: Diagnosis not present

## 2019-11-28 DIAGNOSIS — Z8673 Personal history of transient ischemic attack (TIA), and cerebral infarction without residual deficits: Secondary | ICD-10-CM | POA: Diagnosis not present

## 2019-11-28 DIAGNOSIS — M545 Low back pain, unspecified: Secondary | ICD-10-CM

## 2019-11-28 DIAGNOSIS — G250 Essential tremor: Secondary | ICD-10-CM | POA: Diagnosis not present

## 2019-11-28 DIAGNOSIS — M8008XD Age-related osteoporosis with current pathological fracture, vertebra(e), subsequent encounter for fracture with routine healing: Secondary | ICD-10-CM | POA: Diagnosis not present

## 2019-11-28 DIAGNOSIS — E039 Hypothyroidism, unspecified: Secondary | ICD-10-CM | POA: Diagnosis not present

## 2019-11-28 DIAGNOSIS — Z7982 Long term (current) use of aspirin: Secondary | ICD-10-CM | POA: Diagnosis not present

## 2019-11-28 DIAGNOSIS — E785 Hyperlipidemia, unspecified: Secondary | ICD-10-CM | POA: Diagnosis not present

## 2019-12-01 ENCOUNTER — Telehealth: Payer: Self-pay | Admitting: Family Medicine

## 2019-12-01 NOTE — Telephone Encounter (Signed)
Physical therapist with [facility] calling for verbal orders to see patient once a week for one week and twice a week for two weeks starting January 8th  Call back # [phone]

## 2019-12-01 NOTE — Telephone Encounter (Signed)
Provided verbal orders on VM.   Myra Rude, MD [facility] 12/01/2019, 11:51 AM

## 2019-12-13 ENCOUNTER — Other Ambulatory Visit: Payer: Self-pay | Admitting: Internal Medicine

## 2019-12-15 ENCOUNTER — Telehealth: Payer: Self-pay | Admitting: Family Medicine

## 2019-12-15 NOTE — Telephone Encounter (Signed)
Informed of verbal orders.   Myra Rude, MD [facility] 12/15/2019, 8:51 AM

## 2019-12-15 NOTE — Telephone Encounter (Signed)
Physical therapist with [facility] calling for verbal orders to see patient twice a week for four weeks starting the week of January 25th.   Physical therapist states patient's daughter refused visits for the last two weeks due to fear of the pandemic but has agreed to the next four weeks   Call back number is [phone]

## 2019-12-16 ENCOUNTER — Telehealth (INDEPENDENT_AMBULATORY_CARE_PROVIDER_SITE_OTHER): Payer: Medicare HMO | Admitting: Family Medicine

## 2019-12-16 ENCOUNTER — Other Ambulatory Visit: Payer: Self-pay

## 2019-12-16 DIAGNOSIS — M545 Low back pain, unspecified: Secondary | ICD-10-CM

## 2019-12-16 NOTE — Assessment & Plan Note (Signed)
Has had significant improvement of her pain.  She is getting back to her normal activities. -Can continue physical therapy. -Counseled on home exercise therapy and supportive care.

## 2019-12-16 NOTE — Progress Notes (Signed)
Virtual Visit via Video Note  I connected with Amy Weiss on 12/16/19 at  2:30 PM EST by a video enabled telemedicine application and verified that I am speaking with the correct person using two identifiers.   I discussed the limitations of evaluation and management by telemedicine and the availability of in person appointments. The patient expressed understanding and agreed to proceed.  History of Present Illness:  Amy Weiss is a 85 yo F that is following up for her low back pain. She has been walking and getting back to her normal activities. She hasn't had any significant pain like she previously was having.    Observations/Objective:  Gen: NAD, alert, cooperative with exam, well-appearing   Assessment and Plan:  Low back pain:  Has had significant improvement of her pain.  She is getting back to her normal activities. -Can continue physical therapy. -Counseled on home exercise therapy and supportive care.  Follow Up Instructions:    I discussed the assessment and treatment plan with the patient. The patient was provided an opportunity to ask questions and all were answered. The patient agreed with the plan and demonstrated an understanding of the instructions.   The patient was advised to call back or seek an in-person evaluation if the symptoms worsen or if the condition fails to improve as anticipated.   Clare Gandy, MD

## 2019-12-17 DIAGNOSIS — S32010A Wedge compression fracture of first lumbar vertebra, initial encounter for closed fracture: Secondary | ICD-10-CM | POA: Diagnosis not present

## 2019-12-17 DIAGNOSIS — M545 Low back pain: Secondary | ICD-10-CM | POA: Diagnosis not present

## 2019-12-22 ENCOUNTER — Telehealth: Payer: Self-pay | Admitting: Family Medicine

## 2019-12-22 ENCOUNTER — Ambulatory Visit: Payer: Medicare HMO | Admitting: Family Medicine

## 2019-12-22 DIAGNOSIS — S32010A Wedge compression fracture of first lumbar vertebra, initial encounter for closed fracture: Secondary | ICD-10-CM

## 2019-12-22 DIAGNOSIS — M545 Low back pain, unspecified: Secondary | ICD-10-CM

## 2019-12-22 NOTE — Progress Notes (Deleted)
Amy Weiss - 85 y.o. female MRN [id number]  Date of birth: 1935/09/23  SUBJECTIVE:  Including CC & ROS.  No chief complaint on file.   Amy Weiss is a 85 y.o. female that is  ***.  ***   Review of Systems See HPI   HISTORY: Past Medical, Surgical, Social, and Family History Reviewed & Updated per EMR.   Pertinent Historical Findings include:  Past Medical History:  Diagnosis Date  . Diabetes mellitus 4/09   A1C-6  . Familial tremor   . Hyperlipidemia   . Hypothyroidism   . Osteoporosis   . Tachycardia    AV Re-entry, s/p ablation aprox 2005  . TIA (transient ischemic attack) 12/09    Past Surgical History:  Procedure Laterality Date  . ABDOMINAL HYSTERECTOMY     no oophorectomy  . BLADDER SURGERY     x 2 in the 80s  . BREAST BIOPSY     L (-)    Allergies  Allergen Reactions  . Sulfonamide Derivatives Other (See Comments)    Unknown    Family History  Problem Relation Age of Onset  . Pancreatic cancer Brother   . Pancreatic cancer Father   . Coronary artery disease Mother 62  . Breast cancer Neg Hx   . Colon cancer Neg Hx      Social History   Socioeconomic History  . Marital status: Divorced    Spouse name: Not on file  . Number of children: 4  . Years of education: Not on file  . Highest education level: Not on file  Occupational History  . 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 Smoking status: Former Smoker    Quit date: 06/20/2012    Years since quitting: 7.5  . Smokeless tobacco: Never Used  Substance and Sexual Activity  . Alcohol use: Yes    Comment: beer rarely  . Drug use: No  . Sexual activity: Never  Other Topics Concern  . Not on file  Social History Narrative   Single, live by herself, lost a daughter in law   Daughter Apolonio Schneiders lives close by   Ex- husband lives near   Social Determinants of Health   Financial Resource Strain:   . Difficulty of Paying Living Expenses: Not on file  Food  Insecurity:   . Worried About Charity fundraiser in the Last Year: Not on file  . Ran Out of Food in the Last Year: Not on file  Transportation Needs:   . Lack of Transportation (Medical): Not on file  . Lack of Transportation (Non-Medical): Not on file  Physical Activity:   . Days of Exercise per Week: Not on file  . Minutes of Exercise per Session: Not on file  Stress:   . Feeling of Stress : Not on file  Social Connections:   . Frequency of Communication with Friends and Family: Not on file  . Frequency of Social Gatherings with Friends and Family: Not on file  . Attends Religious Services: Not on file  . Active Member of Clubs or Organizations: Not on file  . Attends Archivist Meetings: Not on file  . Marital Status: Not on file  Intimate Partner Violence:   . Fear of Current or Ex-Partner: Not on file  . Emotionally Abused: Not on file  . Physically Abused: Not on file  . Sexually Abused: Not on file     PHYSICAL EXAM:  VS: There  were no vitals taken for this visit. Physical Exam Gen: NAD, alert, cooperative with exam, well-appearing ENT: normal lips, normal nasal mucosa,  Eye: normal EOM, normal conjunctiva and lids Skin: no rashes, no areas of induration  Neuro: normal tone, normal sensation to touch Psych:  normal insight, alert and oriented MSK:  ***      ASSESSMENT & PLAN:   No problem-specific Assessment & Plan notes found for this encounter.

## 2019-12-22 NOTE — Telephone Encounter (Signed)
Patient's daughter called. Patient is having worsening pain in her lower back causing her to be unable to move from the bed. She wants to come into the office for a follow up but is asking for a prescription of pain medicine to assist her with getting to the office.  Patient also asking for Xanax to help her sleep at night   Pharmacy: [facility] [id number] at [address]

## 2019-12-23 ENCOUNTER — Other Ambulatory Visit: Payer: Self-pay

## 2019-12-23 ENCOUNTER — Inpatient Hospital Stay (HOSPITAL_BASED_OUTPATIENT_CLINIC_OR_DEPARTMENT_OTHER)
Admission: EM | Admit: 2019-12-23 | Discharge: 2019-12-31 | DRG: 516 | Disposition: A | Discharge status: Home Health | Payer: Medicare HMO | Attending: Internal Medicine | Admitting: Internal Medicine

## 2019-12-23 ENCOUNTER — Ambulatory Visit (INDEPENDENT_AMBULATORY_CARE_PROVIDER_SITE_OTHER): Payer: Medicare HMO | Admitting: Internal Medicine

## 2019-12-23 ENCOUNTER — Encounter: Payer: Self-pay | Admitting: Internal Medicine

## 2019-12-23 ENCOUNTER — Emergency Department (HOSPITAL_BASED_OUTPATIENT_CLINIC_OR_DEPARTMENT_OTHER): Payer: Medicare HMO

## 2019-12-23 ENCOUNTER — Encounter (HOSPITAL_BASED_OUTPATIENT_CLINIC_OR_DEPARTMENT_OTHER): Payer: Self-pay | Admitting: Emergency Medicine

## 2019-12-23 DIAGNOSIS — X509XXA Other and unspecified overexertion or strenuous movements or postures, initial encounter: Secondary | ICD-10-CM

## 2019-12-23 DIAGNOSIS — E039 Hypothyroidism, unspecified: Secondary | ICD-10-CM | POA: Diagnosis not present

## 2019-12-23 DIAGNOSIS — Z7189 Other specified counseling: Secondary | ICD-10-CM | POA: Diagnosis not present

## 2019-12-23 DIAGNOSIS — S32000A Wedge compression fracture of unspecified lumbar vertebra, initial encounter for closed fracture: Secondary | ICD-10-CM | POA: Diagnosis not present

## 2019-12-23 DIAGNOSIS — Z79899 Other long term (current) drug therapy: Secondary | ICD-10-CM | POA: Diagnosis not present

## 2019-12-23 DIAGNOSIS — R413 Other amnesia: Secondary | ICD-10-CM | POA: Diagnosis not present

## 2019-12-23 DIAGNOSIS — S22089A Unspecified fracture of T11-T12 vertebra, initial encounter for closed fracture: Secondary | ICD-10-CM | POA: Diagnosis not present

## 2019-12-23 DIAGNOSIS — Z882 Allergy status to sulfonamides status: Secondary | ICD-10-CM | POA: Diagnosis not present

## 2019-12-23 DIAGNOSIS — M810 Age-related osteoporosis without current pathological fracture: Secondary | ICD-10-CM | POA: Diagnosis not present

## 2019-12-23 DIAGNOSIS — E785 Hyperlipidemia, unspecified: Secondary | ICD-10-CM | POA: Diagnosis not present

## 2019-12-23 DIAGNOSIS — Z8673 Personal history of transient ischemic attack (TIA), and cerebral infarction without residual deficits: Secondary | ICD-10-CM

## 2019-12-23 DIAGNOSIS — S32019A Unspecified fracture of first lumbar vertebra, initial encounter for closed fracture: Secondary | ICD-10-CM | POA: Diagnosis not present

## 2019-12-23 DIAGNOSIS — M4854XA Collapsed vertebra, not elsewhere classified, thoracic region, initial encounter for fracture: Secondary | ICD-10-CM | POA: Diagnosis not present

## 2019-12-23 DIAGNOSIS — Z66 Do not resuscitate: Secondary | ICD-10-CM | POA: Diagnosis present

## 2019-12-23 DIAGNOSIS — Z515 Encounter for palliative care: Secondary | ICD-10-CM | POA: Diagnosis not present

## 2019-12-23 DIAGNOSIS — S22080A Wedge compression fracture of T11-T12 vertebra, initial encounter for closed fracture: Secondary | ICD-10-CM

## 2019-12-23 DIAGNOSIS — Z79890 Hormone replacement therapy (postmenopausal): Secondary | ICD-10-CM

## 2019-12-23 DIAGNOSIS — S32010A Wedge compression fracture of first lumbar vertebra, initial encounter for closed fracture: Secondary | ICD-10-CM

## 2019-12-23 DIAGNOSIS — Z03818 Encounter for observation for suspected exposure to other biological agents ruled out: Secondary | ICD-10-CM | POA: Diagnosis not present

## 2019-12-23 DIAGNOSIS — Z87891 Personal history of nicotine dependence: Secondary | ICD-10-CM

## 2019-12-23 DIAGNOSIS — Z7401 Bed confinement status: Secondary | ICD-10-CM

## 2019-12-23 DIAGNOSIS — R001 Bradycardia, unspecified: Secondary | ICD-10-CM | POA: Diagnosis not present

## 2019-12-23 DIAGNOSIS — E038 Other specified hypothyroidism: Secondary | ICD-10-CM | POA: Diagnosis not present

## 2019-12-23 DIAGNOSIS — S32020A Wedge compression fracture of second lumbar vertebra, initial encounter for closed fracture: Secondary | ICD-10-CM | POA: Diagnosis present

## 2019-12-23 DIAGNOSIS — Z8249 Family history of ischemic heart disease and other diseases of the circulatory system: Secondary | ICD-10-CM | POA: Diagnosis not present

## 2019-12-23 DIAGNOSIS — M549 Dorsalgia, unspecified: Secondary | ICD-10-CM | POA: Diagnosis not present

## 2019-12-23 DIAGNOSIS — E119 Type 2 diabetes mellitus without complications: Secondary | ICD-10-CM | POA: Diagnosis present

## 2019-12-23 DIAGNOSIS — Z7982 Long term (current) use of aspirin: Secondary | ICD-10-CM

## 2019-12-23 DIAGNOSIS — Z20822 Contact with and (suspected) exposure to covid-19: Secondary | ICD-10-CM | POA: Diagnosis not present

## 2019-12-23 DIAGNOSIS — R000 Tachycardia, unspecified: Secondary | ICD-10-CM | POA: Diagnosis not present

## 2019-12-23 LAB — CBC WITH DIFFERENTIAL/PLATELET
Abs Immature Granulocytes: 0.04 10*3/uL (ref 0.00–0.07)
Basophils Absolute: 0 10*3/uL (ref 0.0–0.1)
Basophils Relative: 1 %
Eosinophils Absolute: 0.1 10*3/uL (ref 0.0–0.5)
Eosinophils Relative: 2 %
HCT: 47.7 % — ABNORMAL HIGH (ref 36.0–46.0)
Hemoglobin: 15.7 g/dL — ABNORMAL HIGH (ref 12.0–15.0)
Immature Granulocytes: 1 %
Lymphocytes Relative: 21 %
Lymphs Abs: 1.7 10*3/uL (ref 0.7–4.0)
MCH: 30 pg (ref 26.0–34.0)
MCHC: 32.9 g/dL (ref 30.0–36.0)
MCV: 91.2 fL (ref 80.0–100.0)
Monocytes Absolute: 0.9 10*3/uL (ref 0.1–1.0)
Monocytes Relative: 11 %
Neutro Abs: 5.3 10*3/uL (ref 1.7–7.7)
Neutrophils Relative %: 64 %
Platelets: 271 10*3/uL (ref 150–400)
RBC: 5.23 MIL/uL — ABNORMAL HIGH (ref 3.87–5.11)
RDW: 15 % (ref 11.5–15.5)
WBC: 8.1 10*3/uL (ref 4.0–10.5)
nRBC: 0 % (ref 0.0–0.2)

## 2019-12-23 LAB — URINALYSIS, ROUTINE W REFLEX MICROSCOPIC
Bilirubin Urine: NEGATIVE
Glucose, UA: NEGATIVE mg/dL
Hgb urine dipstick: NEGATIVE
Ketones, ur: 15 mg/dL — AB
Leukocytes,Ua: NEGATIVE
Nitrite: NEGATIVE
Protein, ur: NEGATIVE mg/dL
Specific Gravity, Urine: 1.02 (ref 1.005–1.030)
pH: 6 (ref 5.0–8.0)

## 2019-12-23 LAB — COMPREHENSIVE METABOLIC PANEL
ALT: 11 U/L (ref 0–44)
AST: 22 U/L (ref 15–41)
Albumin: 3.6 g/dL (ref 3.5–5.0)
Alkaline Phosphatase: 88 U/L (ref 38–126)
Anion gap: 7 (ref 5–15)
BUN: 23 mg/dL (ref 8–23)
CO2: 24 mmol/L (ref 22–32)
Calcium: 9.3 mg/dL (ref 8.9–10.3)
Chloride: 107 mmol/L (ref 98–111)
Creatinine, Ser: 0.61 mg/dL (ref 0.44–1.00)
GFR calc Af Amer: >60 mL/min (ref >60)
GFR calc non Af Amer: >60 mL/min (ref >60)
Glucose, Bld: 95 mg/dL (ref 70–99)
Potassium: 4.2 mmol/L (ref 3.5–5.1)
Sodium: 138 mmol/L (ref 135–145)
Total Bilirubin: 0.6 mg/dL (ref 0.3–1.2)
Total Protein: 7.4 g/dL (ref 6.5–8.1)

## 2019-12-23 LAB — SARS CORONAVIRUS 2 (TAT 6-24 HRS): SARS Coronavirus 2: NEGATIVE

## 2019-12-23 MED ORDER — FENTANYL CITRATE (PF) 100 MCG/2ML IJ SOLN
50.0000 ug | Freq: Once | INTRAMUSCULAR | Status: DC
  Filled 2019-12-23: qty 2

## 2019-12-23 MED ORDER — LEVOTHYROXINE SODIUM 88 MCG PO TABS
88.0000 ug | ORAL_TABLET | Freq: Every day | ORAL | Status: DC
  Administered 2019-12-24 – 2019-12-31 (×8): 88 ug via ORAL
  Filled 2019-12-23 (×9): qty 1

## 2019-12-23 MED ORDER — SENNA 8.6 MG PO TABS
8.6000 mg | ORAL_TABLET | Freq: Every evening | ORAL | Status: DC | PRN
  Administered 2019-12-25: 8.6 mg via ORAL
  Filled 2019-12-23: qty 1

## 2019-12-23 MED ORDER — SODIUM CHLORIDE 0.9 % IV BOLUS
1000.0000 mL | Freq: Once | INTRAVENOUS | Status: AC
  Administered 2019-12-23: 1000 mL via INTRAVENOUS

## 2019-12-23 MED ORDER — ENOXAPARIN SODIUM 40 MG/0.4ML ~~LOC~~ SOLN: 40.0000 mg | Freq: Every day | SUBCUTANEOUS | Status: DC

## 2019-12-23 MED ORDER — ASPIRIN 81 MG PO CHEW
162.0000 mg | CHEWABLE_TABLET | Freq: Every day | ORAL | Status: DC
  Administered 2019-12-24 – 2019-12-31 (×8): 162 mg via ORAL
  Filled 2019-12-23 (×8): qty 2

## 2019-12-23 MED ORDER — HYDROCODONE-ACETAMINOPHEN 5-325 MG PO TABS
1.0000 | ORAL_TABLET | Freq: Once | ORAL | Status: AC
  Administered 2019-12-23: 16:00:00 1 via ORAL
  Filled 2019-12-23: qty 1

## 2019-12-23 NOTE — ED Notes (Signed)
ED Provider at bedside speaking with daughter about patient plan of care.

## 2019-12-23 NOTE — Progress Notes (Signed)
85 y.o. female came to [facility] ER for L1 fracture of indeterminate age, [facility] recommend pt discharge home with TLSO brace and pain meds, but pt was not able to tolerate the brace. Admit to [facility] for PT evaluation probably discharge to SNF vs acute Rehab. She has chronic bradycardia but stable and asymptomatic.

## 2019-12-23 NOTE — Telephone Encounter (Signed)
Spoke with patient's daughter about acute worsening of back pain. Will try home health.   Myra Rude, MD [facility] 12/23/2019, 8:28 AM

## 2019-12-23 NOTE — ED Notes (Signed)
Butler MD made aware of pt HR of 38, holding Fentanyl.

## 2019-12-23 NOTE — ED Provider Notes (Signed)
Patient received her back brace.  Was given 1 tablet hydrocodone.  Had some pain relief did not get drowsy.  But patient still refused to even sit up he had a long stand up said that she was not able to do it.  Contacted hospitalist for admission.  This will probably mean placement to rehab facility.  Patient asymptomatic but Covid testing done for admission.   Vanetta Mulders, MD 12/23/19 [phone]

## 2019-12-23 NOTE — Progress Notes (Signed)
Subjective:    Patient ID: Amy Weiss, female    DOB: 1935-02-24, 85 y.o.   MRN: [id number]  DOS:  12/23/2019 Type of visit - description: Virtual Visit via Video Note  I connected with the above patient  by a video enabled telemedicine application and verified that I am speaking with the correct person using two identifiers.   THIS ENCOUNTER IS A VIRTUAL VISIT DUE TO COVID-19 - PATIENT WAS NOT SEEN IN THE OFFICE. PATIENT HAS CONSENTED TO VIRTUAL VISIT / TELEMEDICINE VISIT  4 Location of patient: home  Location of provider: office  I discussed the limitations of evaluation and management by telemedicine and the availability of in person appointments. The patient expressed understanding and agreed to proceed.  Acute The last time I saw her 11/18/2019, she had back pain, subsequently saw sports medicine, x-ray show age indeterminate  L1 fracture. The last visit with sports medicine was 12/15/2018 and at that time she was felt to be doing better. 5 days ago she took a walk around the neighborhood and then went to bed. Since then she has not been able to get out of bed due to excruciating back pain after trying to get up. She is able to move all her extremities, she denies any new or different paresthesias of the extremities. She did report neck pain.  Medication list was edited, she is not taking hydrocodone, baclofen or Flexeril.     Review of Systems No fever chills. No cough or URI symptoms The family has noted decrease p.o. intake. Her urine is looking dark and they wonder about dehydration or a UTI.   Past Medical History:  Diagnosis Date  . Diabetes mellitus 4/09   A1C-6  . Familial tremor   . Hyperlipidemia   . Hypothyroidism   . Osteoporosis   . Tachycardia    AV Re-entry, s/p ablation aprox 2005  . TIA (transient ischemic attack) 12/09    Past Surgical History:  Procedure Laterality Date  . ABDOMINAL HYSTERECTOMY     no oophorectomy  . BLADDER SURGERY       x 2 in the 80s  . BREAST BIOPSY     L (-)        Objective:   Physical Exam There were no vitals taken for this visit. This is a virtual video visit, participants included the patient, Crystal (caregiver) and over the phone to all of the patient's daughters. The patient is lying down in bed, speaking in complete sentences, in no apparent distress at rest.  She is able to lift her head and talk to me.    Assessment     Assessment > Prediabetes   dx 2009 >>>  A1c 6.0 Hypothyroidism TIA 2009 DJD  Osteoporosis per dexa 2008, consistently declined retesting Familial tremor CV: see cardiology at [facility] ---AV reentry tachycardia, ablation 2005 ---Bradycardic , Mobitz I , junctional escapes -- saw cards @ [facility], f/u 1 year, pt decided not to go back  PLAN Virtual visit, participants Albina Billet (patient's caregiver) and 2 of her daughters. Back pain: Symptoms started 11/16/2019, I saw her virtually and subsequently saw sports medicine, x-ray done 11/25/2019 showed age indeterminant L1 fracture. She was treated conservatively. The family does not like to use extra medications due to dizziness and confusion, consequently she is not taking hydrocodone, baclofen or Flexeril. Up to 5 days ago she was improving and was able to take small walks around the neighborhood but then she become bed ridden suddenly.  She is unable to get up due to excruciating back pain. P.o. increase has decreased. Plan: Needs for their evaluation today, recommend to be transported at the ER possibly admitted depending on the ER work-up. -This was discussed with the patient which was very reluctant to go to the emergency room. -I also explained this to the caregiving each of the daughters independently. Addendum: -I spoke with the ER doctors twice, she has a new vertebral compression fracture, neurosurgery a TLSO and outpatient visit with them. At this point at 5 PM they are trying the brace, providing some  pain medications and see if she can take some steps on her own before the final decision to be discharge is made. I also spoke with the daughter [name] again, she is not optimistic about getting all the help she needs if she is discharged home. PT has been already order by sports medicine, I offer them to get some additional help (palliative care?)  .  Today I spent more than 60 minutes taking care of the patient, talking with the caregiver, daughters, to ER doctors.     I discussed the assessment and treatment plan with the patient. The patient was provided an opportunity to ask questions and all were answered. The patient agreed with the plan and demonstrated an understanding of the instructions.   The patient was advised to call back or seek an in-person evaluation if the symptoms worsen or if the condition fails to improve as anticipated.

## 2019-12-23 NOTE — ED Provider Notes (Signed)
[facility] EMERGENCY DEPARTMENT Provider Note   CSN: [id number] Arrival date & time: 12/23/19  1237     History Chief Complaint  Patient presents with  . Back Pain    Amy Weiss is a 85 y.o. female.  She was sent in by her primary care doctor for evaluation of low back pain.  She had a back injury after Christmas from lifting something.  She had x-rays done early January that showed an L1 fracture of indeterminate age.  She has seen sports medicine and was given some Depo-Medrol and Toradol.  She has a prescription for pain medicine but does not use it.  Using extra think Tylenol.  Reportedly had some improvement but over the past week has been in bed with pain.  No pain at rest only when she moves.  No numbness or weakness down her extremities.  No bowel or bladder incontinence.  No fever.  The history is provided by the patient.  Back Pain Location:  Lumbar spine Quality:  Stabbing Radiates to:  Does not radiate Pain severity:  Moderate Pain is:  Same all the time Onset quality:  Gradual Timing:  Intermittent Progression:  Worsening Chronicity:  New Context: lifting heavy objects   Relieved by:  Being still Worsened by:  Movement Ineffective treatments:  OTC medications Associated symptoms: no abdominal pain, no bladder incontinence, no bowel incontinence, no chest pain, no dysuria, no fever, no headaches, no leg pain, no numbness, no tingling and no weakness        Past Medical History:  Diagnosis Date  . Diabetes mellitus 4/09   A1C-6  . Familial tremor   . Hyperlipidemia   . Hypothyroidism   . Osteoporosis   . Tachycardia    AV Re-entry, s/p ablation aprox 2005  . TIA (transient ischemic attack) 12/09    Patient Active Problem List   Diagnosis Date Noted  . Acute bilateral low back pain without sciatica 11/26/2019  . PCP NOTES >>>> 10/18/2015  . Atrioventricular block, Mobitz type 1, Wenckebach 12/10/2014  . Eczema 12/29/2011  . Annual  physical exam 05/09/2011  . SKIN LESION 05/06/2010  . Hyperglycemia 11/19/2008  . TIA 11/19/2008  . Hyperlipidemia 03/04/2008  . FAMILIAL TREMOR 03/04/2008  . Arrhythmia--h/o AV reentry tachycardia 03/04/2008  . ARTHRALGIA 03/04/2008  . OSTEOPOROSIS 03/04/2008  . Hypothyroidism 04/05/2007    Past Surgical History:  Procedure Laterality Date  . ABDOMINAL HYSTERECTOMY     no oophorectomy  . BLADDER SURGERY     x 2 in the 80s  . BREAST BIOPSY     L (-)     OB History   No obstetric history on file.     Family History  Problem Relation Age of Onset  . Pancreatic cancer Brother   . Pancreatic cancer Father   . Coronary artery disease Mother 27  . Breast cancer Neg Hx   . Colon cancer Neg Hx     Social History   Tobacco Use  . Smoking status: Former Smoker    Quit date: 06/20/2012    Years since quitting: 7.5  . Smokeless tobacco: Never Used  Substance Use Topics  . Alcohol use: Yes    Comment: beer rarely  . Drug use: No    Home Medications Prior to Admission medications   Medication Sig Start Date End Date Taking? Authorizing Provider  aspirin 81 MG tablet Take 162 mg by mouth daily.    [provider]  Calcium-Vitamin D (CALTRATE 600  PLUS-VIT D PO) Take by mouth. 1 a day    [provider]  hydrocortisone 2.5 % cream Apply topically 2 (two) times daily. 12/15/19   [place] Branch, MD  levothyroxine (SYNTHROID) 88 MCG tablet Take 1 tablet (88 mcg total) by mouth daily before breakfast. 08/11/19   [place] Branch, MD  Multiple Vitamin (MULITIVITAMIN WITH MINERALS) TABS Take 1 tablet by mouth daily.    [provider]  pravastatin (PRAVACHOL) 80 MG tablet Take 1 tablet (80 mg total) by mouth daily. Patient not taking: Reported on 12/23/2019 04/18/18   [place] Branch, MD  senna (SENOKOT) 8.6 MG tablet Take 1 tablet by mouth daily as needed.     [provider]  TURMERIC PO Take 1 tablet by mouth daily.    [provider]    Allergies     Sulfonamide derivatives  Review of Systems   Review of Systems  Constitutional: Negative for fever.  HENT: Negative for sore throat.   Eyes: Negative for visual disturbance.  Respiratory: Negative for shortness of breath.   Cardiovascular: Negative for chest pain.  Gastrointestinal: Negative for abdominal pain and bowel incontinence.  Genitourinary: Negative for bladder incontinence and dysuria.  Musculoskeletal: Positive for back pain.  Skin: Negative for rash.  Neurological: Negative for tingling, weakness, numbness and headaches.    Physical Exam Updated Vital Signs BP (!) 153/73   Pulse (!) 44   Temp 98.5 F (36.9 C) (Oral)   Resp 16   SpO2 100%   Physical Exam Vitals and nursing note reviewed.  Constitutional:      General: She is not in acute distress.    Appearance: She is well-developed.  HENT:     Head: Normocephalic and atraumatic.  Eyes:     Conjunctiva/sclera: Conjunctivae normal.  Cardiovascular:     Rate and Rhythm: Regular rhythm. Bradycardia present.     Heart sounds: Murmur present.  Pulmonary:     Effort: Pulmonary effort is normal. No respiratory distress.     Breath sounds: Normal breath sounds.  Abdominal:     Palpations: Abdomen is soft.     Tenderness: There is no abdominal tenderness.  Musculoskeletal:        General: Tenderness present.     Cervical back: Neck supple.     Right lower leg: No edema.     Left lower leg: No edema.     Comments: She has midline lumbar tenderness.  Skin:    General: Skin is warm and dry.     Capillary Refill: Capillary refill takes less than 2 seconds.  Neurological:     General: No focal deficit present.     Mental Status: She is alert.     Sensory: No sensory deficit.     Motor: No weakness.     ED Results / Procedures / Treatments   Labs (all labs ordered are listed, but only abnormal results are displayed) Labs Reviewed  CBC WITH DIFFERENTIAL/PLATELET - Abnormal; Notable for the following  components:      Result Value   RBC 5.23 (*)    Hemoglobin 15.7 (*)    HCT 47.7 (*)    All other components within normal limits  URINALYSIS, ROUTINE W REFLEX MICROSCOPIC - Abnormal; Notable for the following components:   Ketones, ur 15 (*)    All other components within normal limits  COMPREHENSIVE METABOLIC PANEL    EKG EKG Interpretation  Date/Time:  Tuesday December 23 2019 13:42:22 EST Ventricular Rate:  39 PR Interval:    QRS Duration: 113 QT Interval:  587 QTC Calculation: 473 R Axis:   92 Text Interpretation: ? Junctional brady Incomplete right bundle branch block Repol abnrm suggests ischemia, lateral leads No significant change since 12/09 Confirmed by Meridee Score [phone]) on 12/23/2019 1:46:23 PM   Radiology CT Lumbar Spine Wo Contrast  Result Date: 12/23/2019 CLINICAL DATA:  Low back pain. EXAM: CT LUMBAR SPINE WITHOUT CONTRAST TECHNIQUE: Multidetector CT imaging of the lumbar spine was performed without intravenous contrast administration. Multiplanar CT image reconstructions were also generated. COMPARISON:  None. FINDINGS: Segmentation: 5 lumbar type vertebrae. Alignment: Normal. Vertebrae: Acute L1 vertebral body compression fracture with approximately 60% height loss and 6 mm of retropulsion of the superior posterior margin. Mild age-indeterminate T12 vertebral body compression fracture. No aggressive osseous lesion. Paraspinal and other soft tissues: No acute paraspinal abnormality. Abdominal aortic atherosclerosis. Mild abdominal aortic ectasia measuring 2.5 cm. Disc levels: Degenerative disc disease with disc height loss at L4-5. severe degenerative disease with disc height loss at L5-S1. Bilateral facet arthropathy at L5-S1. No significant foraminal stenosis. IMPRESSION: 1. Acute L1 vertebral body compression fracture with approximately 60% height loss and 6 mm of retropulsion of the superior posterior margin. 2. Mild age-indeterminate T12 vertebral body compression  fracture. 3. 2.5 cm ectatic abdominal aorta, at risk for aneurysm development. Recommend follow-up aortic ultrasound in 5 years. This recommendation follows ACR consensus guidelines: White Paper of the ACR Incidental Findings Committee II on Vascular Findings. J Am Coll Radiol 2013; 27:741-287. 4.  Aortic Atherosclerosis (ICD10-I70.0). Electronically Signed   By: Elige Ko   On: 12/23/2019 14:12    Procedures Procedures (including critical care time)  Medications Ordered in ED Medications  fentaNYL (SUBLIMAZE) injection 50 mcg (0 mcg Intravenous Hold 12/23/19 1334)  sodium chloride 0.9 % bolus 1,000 mL ( Intravenous Stopped 12/23/19 1601)  HYDROcodone-acetaminophen (NORCO/VICODIN) 5-325 MG per tablet 1 tablet (1 tablet Oral Given 12/23/19 1623)    ED Course  I have reviewed the triage vital signs and the nursing notes.  Pertinent labs & imaging results that were available during my care of the patient were reviewed by me and considered in my medical decision making (see chart for details).  Clinical Course as of Dec 23 1655  Tue Dec 23, 2019  1307 Differential includes lumbar fracture, radiculopathy, musculoskeletal, spinal stenosis.  Will check a noncontrast lumbar spine CT.  Getting some screening labs and urinalysis.  Pain control with IV medication.   [MB]  1343 Patient with marked bradycardia here as low as high 30s mostly in the 40s.  Asymptomatic blood pressure good.  Dr. Drue Novel has noted this in prior visits and put in his note: CV: see cardiology at [facility] ---AV reentry tachycardia, ablation 2005 ---Bradycardic , Mobitz I , junctional escapes -- saw cards @ [facility], f/u 1 year, pt decided not to go back   [MB]  1349 Cardiology note 1/16: ASSESSMENT AND PLAN: 1. Mobitz (type) I (Wenckebach's) atrioventricular block    Ravina has asymptomatic Mobitz I AVB.   There is no indication for any intervention.  We will obtain records from [facility] from 2005 to 2009 to clarify  if pt had any prior ablations  Return to clinic in 1 year.    [MB]  1436 Discussed with Dr. Johnsie Cancel from [facility].  He is recommending the patient go into a TLSO and if she can safely ambulate with she can follow-up in the office with him this week.   [  MB]  1443 I called the patient's daughter Amy Weiss at [phone].  She said the patient has been declining for a few years with dementia but definitely is forgetting to eat and drink.  Sounds like family is over to the house 2 or 3 times a day to try to keep an eye on her.  She is concerned that even with the brace that she may be too weak and may need admission or at least home services.   [MB]  1459 Discussed with Dr. Drue Novel patient's primary care doctor.  He said he would put in for home health services but he is still concerned that she will not be able to transfer into well at home.  He said to call them back if there was any problems   [MB]    Clinical Course User Index [MB] Terrilee Files, MD   MDM Rules/Calculators/A&P                     Signed out to Dr Deretha Emory to reeval patient after fitted for brace and see if appropriate for discharge.   Final Clinical Impression(s) / ED Diagnoses Final diagnoses:  Closed compression fracture of body of L1 vertebra (HCC)  Bradycardia    Rx / DC Orders ED Discharge Orders    None       Terrilee Files, MD 12/23/19 1659

## 2019-12-23 NOTE — ED Triage Notes (Signed)
Per EMS:  Pt has L1 fracture one month ago.  Pt having back pain which worsens yesterday.  Pt has been in bed 5 days.

## 2019-12-23 NOTE — ED Notes (Signed)
IV attempted x1 without success

## 2019-12-23 NOTE — ED Notes (Signed)
Attempted to get patient out of bed, pt unable to tolerate r/t being in pain. ED MD made aware and at bedside at this time speaking with patient.

## 2019-12-24 ENCOUNTER — Telehealth: Payer: Self-pay | Admitting: Family Medicine

## 2019-12-24 ENCOUNTER — Inpatient Hospital Stay (HOSPITAL_COMMUNITY): Payer: Medicare HMO

## 2019-12-24 ENCOUNTER — Encounter (HOSPITAL_COMMUNITY): Payer: Self-pay | Admitting: Internal Medicine

## 2019-12-24 DIAGNOSIS — S32000A Wedge compression fracture of unspecified lumbar vertebra, initial encounter for closed fracture: Secondary | ICD-10-CM | POA: Diagnosis present

## 2019-12-24 DIAGNOSIS — E038 Other specified hypothyroidism: Secondary | ICD-10-CM

## 2019-12-24 DIAGNOSIS — R001 Bradycardia, unspecified: Secondary | ICD-10-CM

## 2019-12-24 DIAGNOSIS — S32010A Wedge compression fracture of first lumbar vertebra, initial encounter for closed fracture: Secondary | ICD-10-CM

## 2019-12-24 LAB — BASIC METABOLIC PANEL
Anion gap: 8 (ref 5–15)
BUN: 18 mg/dL (ref 8–23)
CO2: 23 mmol/L (ref 22–32)
Calcium: 8.8 mg/dL — ABNORMAL LOW (ref 8.9–10.3)
Chloride: 110 mmol/L (ref 98–111)
Creatinine, Ser: 0.59 mg/dL (ref 0.44–1.00)
GFR calc Af Amer: >60 mL/min (ref >60)
GFR calc non Af Amer: >60 mL/min (ref >60)
Glucose, Bld: 109 mg/dL — ABNORMAL HIGH (ref 70–99)
Potassium: 4.1 mmol/L (ref 3.5–5.1)
Sodium: 141 mmol/L (ref 135–145)

## 2019-12-24 LAB — CBC
HCT: 42.1 % (ref 36.0–46.0)
Hemoglobin: 14.2 g/dL (ref 12.0–15.0)
MCH: 30.1 pg (ref 26.0–34.0)
MCHC: 33.7 g/dL (ref 30.0–36.0)
MCV: 89.2 fL (ref 80.0–100.0)
Platelets: 253 10*3/uL (ref 150–400)
RBC: 4.72 MIL/uL (ref 3.87–5.11)
RDW: 15 % (ref 11.5–15.5)
WBC: 10.2 10*3/uL (ref 4.0–10.5)
nRBC: 0 % (ref 0.0–0.2)

## 2019-12-24 MED ORDER — ACETAMINOPHEN 325 MG PO TABS
650.0000 mg | ORAL_TABLET | Freq: Four times a day (QID) | ORAL | Status: DC | PRN
  Administered 2019-12-24 – 2019-12-25 (×3): 650 mg via ORAL
  Filled 2019-12-24 (×3): qty 2

## 2019-12-24 MED ORDER — ONDANSETRON HCL 4 MG/2ML IJ SOLN: 4.0000 mg | Freq: Four times a day (QID) | INTRAMUSCULAR | Status: DC | PRN

## 2019-12-24 MED ORDER — ACETAMINOPHEN 325 MG PO TABS
650.0000 mg | ORAL_TABLET | Freq: Four times a day (QID) | ORAL | Status: DC | PRN
  Administered 2019-12-24: 03:00:00 650 mg via ORAL
  Filled 2019-12-24: qty 2

## 2019-12-24 MED ORDER — ENOXAPARIN SODIUM 40 MG/0.4ML ~~LOC~~ SOLN: 40.0000 mg | Freq: Every day | SUBCUTANEOUS | Status: DC

## 2019-12-24 MED ORDER — ACETAMINOPHEN 650 MG RE SUPP: 650.0000 mg | Freq: Four times a day (QID) | RECTAL | Status: DC | PRN

## 2019-12-24 MED ORDER — HYDROMORPHONE HCL 1 MG/ML IJ SOLN
0.5000 mg | Freq: Once | INTRAMUSCULAR | Status: AC
  Administered 2019-12-25: 13:00:00 0.5 mg via INTRAVENOUS
  Filled 2019-12-24 (×2): qty 1

## 2019-12-24 MED ORDER — ONDANSETRON HCL 4 MG PO TABS: 4.0000 mg | ORAL_TABLET | Freq: Four times a day (QID) | ORAL | Status: DC | PRN

## 2019-12-24 MED ORDER — HYDROCODONE-ACETAMINOPHEN 5-325 MG PO TABS
1.0000 | ORAL_TABLET | Freq: Four times a day (QID) | ORAL | Status: DC | PRN
  Administered 2019-12-25 – 2019-12-31 (×16): 1 via ORAL
  Filled 2019-12-24 (×17): qty 1

## 2019-12-24 NOTE — Progress Notes (Signed)
Pt stable on arrival. Pt c/o back pain and asked to remove back brace. Pt received tylenol for pain and is resting well. Pt can become confused but easily reoriented. Place heel foams bilaterally d/t reddened heels. Unable to access sacral d/t pt refusing to roll to side because of severe pain. Will continue to monitor pt.

## 2019-12-24 NOTE — Progress Notes (Signed)
Pt refused scan at this time. Would like to possibly try at a later time. RN coming down to give pain meds was made aware.

## 2019-12-24 NOTE — Progress Notes (Addendum)
PROGRESS NOTE    Amy Weiss  WNU:[id number] DOB: [birth year] DOA: 12/23/2019 PCP: [place] Branch, MD    Brief Narrative:  85 year old female with history of hypothyroidism and memory issues, SVT status post ablation with chronic sinus bradycardia, ongoing back pain issues with multiple interventions for last 1 month presented to ER with worsening pain and unable to ambulate.  Patient has some forgetfulness.  She lives alone.  Last week or so she has been more bedbound due to cold and had worsening pain. Presented to [facility].  Lumbar spine CT scan showed acute L1 compression fracture and age-indeterminate T12 compression fracture.  Neurosurgery was consulted who requested T SLO brace and mobility.  Patient could not walk hence admitted to the hospital.   Assessment & Plan:   Principal Problem:   Lumbar compression fracture (HCC) Active Problems:   Hypothyroidism   Bradycardia   Lumbar compression fracture, closed, initial encounter (HCC)  Severe intractable back pain with multilevel lumbar compression fracture: L1 compression fracture could be subacute/ acute.  She also has T12 fracture. Likely good candidate for kyphoplasty. Discussed with interventional radiology, MRI of the lumbar spine today. Continue mobility with PT OT.  Adequate pain medications.  Fall precautions. Anticipate inpatient rehab either at acute inpatient rehab or at a skilled nursing rehab depends upon her participation and therapy evaluations.  Hypothyroidism: On Synthroid.  Chronic bradycardia: Stable.   DVT prophylaxis: Lovenox.  Will change to SCD in anticipation of any procedure. Code Status: DNR. Family Communication: Patient's daughter Ms. [name], updated about the plan of care to do MRI and probable kyphoplasty. Disposition Plan: patient is from home. Anticipated DC to inpatient rehab versus skilled nursing rehab, Barriers to discharge on active treatment.  Pain management.  Work with PT  OT. Patient is in too much pain to stand up or walk and unsafe to dc home.  Consultants:   Interventional radiology  Procedures:   None  Antimicrobials:   None   Subjective: Patient seen and examined.  She is pleasantly confused.  She is not sure why she is in the hospital.  She knows he is at [facility].  She says she has some lower back pain, she thinks is probably due to sleeping in his bed.  Objective: Vitals:   12/24/19 0000 12/24/19 0001 12/24/19 0116 12/24/19 0813  BP: (!) 145/65  (!) 153/73 (!) 148/62  Pulse: (!) 42  (!) 43 (!) 40  Resp: 16  17 17   Temp: 98.5 F (36.9 C) 98.5 F (36.9 C) 97.7 F (36.5 C) 97.8 F (36.6 C)  TempSrc: Oral Oral  Oral  SpO2: 95%  99% 100%  Weight:      Height:        Intake/Output Summary (Last 24 hours) at 12/24/2019 1058 Last data filed at 12/23/2019 1615 Gross per 24 hour  Intake 1001.46 ml  Output --  Net 1001.46 ml   Filed Weights   12/23/19 1327  Weight: 62.5 kg    Examination:  General exam: Appears calm and comfortable, pleasant not in any distress. Respiratory system: Clear to auscultation. Respiratory effort normal. Cardiovascular system: S1 & S2 heard, RRR. No JVD, murmurs, rubs, gallops or clicks. No pedal edema. Gastrointestinal system: Abdomen is nondistended, soft and nontender. No organomegaly or masses felt. Normal bowel sounds heard. Central nervous system: Alert and oriented x1.  Pleasantly confused. No focal neurological deficits. Extremities: Symmetric 5 x 5 power. Skin: No rashes, lesions or ulcers No localized tenderness  or deformity.    Data Reviewed: I have personally reviewed following labs and imaging studies  CBC: Recent Labs  Lab 12/23/19 1329 12/24/19 0431  WBC 8.1 10.2  NEUTROABS 5.3  --   HGB 15.7* 14.2  HCT 47.7* 42.1  MCV 91.2 89.2  PLT 271 253   Basic Metabolic Panel: Recent Labs  Lab 12/23/19 1329 12/24/19 0431  NA 138 141  K 4.2 4.1  CL 107 110  CO2 24 23   GLUCOSE 95 109*  BUN 23 18  CREATININE 0.61 0.59  CALCIUM 9.3 8.8*   GFR: Estimated Creatinine Clearance: 52.7 mL/min (by C-G formula based on SCr of 0.59 mg/dL). Liver Function Tests: Recent Labs  Lab 12/23/19 1329  AST 22  ALT 11  ALKPHOS 88  BILITOT 0.6  PROT 7.4  ALBUMIN 3.6   No results for input(s): LIPASE, AMYLASE in the last 168 hours. No results for input(s): AMMONIA in the last 168 hours. Coagulation Profile: No results for input(s): INR, PROTIME in the last 168 hours. Cardiac Enzymes: No results for input(s): CKTOTAL, CKMB, CKMBINDEX, TROPONINI in the last 168 hours. BNP (last 3 results) No results for input(s): PROBNP in the last 8760 hours. HbA1C: No results for input(s): HGBA1C in the last 72 hours. CBG: No results for input(s): GLUCAP in the last 168 hours. Lipid Profile: No results for input(s): CHOL, HDL, LDLCALC, TRIG, CHOLHDL, LDLDIRECT in the last 72 hours. Thyroid Function Tests: No results for input(s): TSH, T4TOTAL, FREET4, T3FREE, THYROIDAB in the last 72 hours. Anemia Panel: No results for input(s): VITAMINB12, FOLATE, FERRITIN, TIBC, IRON, RETICCTPCT in the last 72 hours. Sepsis Labs: No results for input(s): PROCALCITON, LATICACIDVEN in the last 168 hours.  Recent Results (from the past 240 hour(s))  SARS CORONAVIRUS 2 (TAT 6-24 HRS) Nasopharyngeal Nasopharyngeal Swab     Status: None   Collection Time: 12/23/19  5:44 PM   Specimen: Nasopharyngeal Swab  Result Value Ref Range Status   SARS Coronavirus 2 NEGATIVE NEGATIVE Final    Comment: (NOTE) SARS-CoV-2 target nucleic acids are NOT DETECTED. The SARS-CoV-2 RNA is generally detectable in upper and lower respiratory specimens during the acute phase of infection. Negative results do not preclude SARS-CoV-2 infection, do not rule out co-infections with other pathogens, and should not be used as the sole basis for treatment or other patient management decisions. Negative results must be  combined with clinical observations, patient history, and epidemiological information. The expected result is Negative. Fact Sheet for Patients: HairSlick.no Fact Sheet for Healthcare Providers: quierodirigir.com This test is not yet approved or cleared by the [place] FDA and  has been authorized for detection and/or diagnosis of SARS-CoV-2 by FDA under an Emergency Use Authorization (EUA). This EUA will remain  in effect (meaning this test can be used) for the duration of the COVID-19 declaration under Section 56 4(b)(1) of the Act, 21 U.S.C. section 360bbb-3(b)(1), unless the authorization is terminated or revoked sooner. Performed at [facility], [address], [place], [place] [address]          Radiology Studies: CT Lumbar Spine Wo Contrast  Result Date: 12/23/2019 CLINICAL DATA:  Low back pain. EXAM: CT LUMBAR SPINE WITHOUT CONTRAST TECHNIQUE: Multidetector CT imaging of the lumbar spine was performed without intravenous contrast administration. Multiplanar CT image reconstructions were also generated. COMPARISON:  None. FINDINGS: Segmentation: 5 lumbar type vertebrae. Alignment: Normal. Vertebrae: Acute L1 vertebral body compression fracture with approximately 60% height loss and 6 mm of retropulsion  of the superior posterior margin. Mild age-indeterminate T12 vertebral body compression fracture. No aggressive osseous lesion. Paraspinal and other soft tissues: No acute paraspinal abnormality. Abdominal aortic atherosclerosis. Mild abdominal aortic ectasia measuring 2.5 cm. Disc levels: Degenerative disc disease with disc height loss at L4-5. severe degenerative disease with disc height loss at L5-S1. Bilateral facet arthropathy at L5-S1. No significant foraminal stenosis. IMPRESSION: 1. Acute L1 vertebral body compression fracture with approximately 60% height loss and 6 mm of retropulsion of the superior posterior  margin. 2. Mild age-indeterminate T12 vertebral body compression fracture. 3. 2.5 cm ectatic abdominal aorta, at risk for aneurysm development. Recommend follow-up aortic ultrasound in 5 years. This recommendation follows ACR consensus guidelines: White Paper of the ACR Incidental Findings Committee II on Vascular Findings. J Am Coll Radiol 2013; 19:379-024. 4.  Aortic Atherosclerosis (ICD10-I70.0). Electronically Signed   By: Elige Ko   On: 12/23/2019 14:12        Scheduled Meds: . aspirin  162 mg Oral Daily  . levothyroxine  88 mcg Oral QAC breakfast   Continuous Infusions:   LOS: 1 day    Time spent: 25 minutes    Dorcas Carrow, MD [facility] Pager [phone]

## 2019-12-24 NOTE — Telephone Encounter (Signed)
Spoke with patient's daughter  Myra Rude, MD [facility] 12/24/2019, 9:29 AM

## 2019-12-24 NOTE — Plan of Care (Signed)

## 2019-12-24 NOTE — Evaluation (Signed)
Physical Therapy Evaluation Patient Details Name: Amy Weiss MRN: [id number] DOB: 1935/01/21 Today's Date: 12/24/2019   History of Present Illness  Pt is an 85 y/o female admitted secondary to back pain and found to have an L1 compression fx. Neurosurgery was consulted and recommended a TLSO. PMH including but not limited to DM, HTN and TIA.    Clinical Impression  Pt presented supine in bed with HOB elevated, awake and willing to participate in therapy session. Prior to admission, pt reported that she has basically been lying in bed for the past several days secondary to pain. She stated that her family felt she needed to come to the hospital as she wasn't able to get out of bed to use the bathroom or do anything. Pt lives alone in a single level home with one step to enter. At the time of evaluation, pt significantly limited secondary to back pain and only able to tolerate bed mobility. Pt able to roll bilaterally with min A and use of bed rails. Attempted to assist pt into sitting from sidelying position; however, pt reporting too much pain and refusing to try. At this time, recommending SNF for further intensive therapy services to maximize her independence with functional mobility prior to returning home alone. Pt would continue to benefit from skilled physical therapy services at this time while admitted and after d/c to address the below listed limitations in order to improve overall safety and independence with functional mobility.     Follow Up Recommendations SNF    Equipment Recommendations  None recommended by PT    Recommendations for Other Services       Precautions / Restrictions Precautions Precautions: Fall;Back Required Braces or Orthoses: Spinal Brace Spinal Brace: Thoracolumbosacral orthotic Restrictions Weight Bearing Restrictions: No      Mobility  Bed Mobility Overal bed mobility: Needs Assistance Bed Mobility: Rolling Rolling: Min assist          General bed mobility comments: cueing for log roll technique, pt able to achieve sidelying towards her L side with use of bed rails and min A; therapist assisting bilateral LEs off of bed and then began to assist pt's trunk into upright sitting. However, pt reporting too much pain and refusing to attempt  Transfers                    Ambulation/Gait                Stairs            Wheelchair Mobility    Modified Rankin (Stroke Patients Only)       Balance                                             Pertinent Vitals/Pain Pain Assessment: Faces Faces Pain Scale: Hurts whole lot Pain Location: back Pain Descriptors / Indicators: Grimacing;Guarding Pain Intervention(s): Monitored during session;Repositioned    Home Living Family/patient expects to be discharged to:: Private residence Living Arrangements: Alone Available Help at Discharge: Family;Friend(s);Available PRN/intermittently Type of Home: House Home Access: Stairs to enter   [employer] of Steps: 1 Home Layout: One level Home Equipment: None      Prior Function Level of Independence: Independent         Comments: drives     Hand Dominance        Extremity/Trunk  Assessment   Upper Extremity Assessment Upper Extremity Assessment: Generalized weakness    Lower Extremity Assessment Lower Extremity Assessment: Generalized weakness    Cervical / Trunk Assessment Cervical / Trunk Assessment: Other exceptions Cervical / Trunk Exceptions: L1 compression fx  Communication   Communication: No difficulties  Cognition Arousal/Alertness: Awake/alert Behavior During Therapy: WFL for tasks assessed/performed;Anxious Overall Cognitive Status: Impaired/Different from baseline Area of Impairment: Memory;Safety/judgement;Problem solving                     Memory: Decreased short-term memory;Decreased recall of precautions   Safety/Judgement:  Decreased awareness of deficits;Decreased awareness of safety   Problem Solving: Difficulty sequencing;Requires verbal cues        General Comments      Exercises     Assessment/Plan    PT Assessment Patient needs continued PT services  PT Problem List Decreased strength;Decreased activity tolerance;Decreased balance;Decreased mobility;Decreased coordination;Decreased knowledge of use of DME;Decreased safety awareness;Decreased knowledge of precautions;Pain;Decreased cognition       PT Treatment Interventions DME instruction;Gait training;Stair training;Therapeutic activities;Functional mobility training;Balance training;Therapeutic exercise;Neuromuscular re-education;Patient/family education    PT Goals (Current goals can be found in the Care Plan section)  Acute Rehab PT Goals Patient Stated Goal: decrease pain PT Goal Formulation: With patient Time For Goal Achievement: 01/07/20 Potential to Achieve Goals: Fair    Frequency Min 2X/week   Barriers to discharge        Co-evaluation               AM-PAC PT "6 Clicks" Mobility  Outcome Measure Help needed turning from your back to your side while in a flat bed without using bedrails?: A Little Help needed moving from lying on your back to sitting on the side of a flat bed without using bedrails?: A Little Help needed moving to and from a bed to a chair (including a wheelchair)?: A Little Help needed standing up from a chair using your arms (e.g., wheelchair or bedside chair)?: A Lot Help needed to walk in hospital room?: A Lot Help needed climbing 3-5 steps with a railing? : A Lot 6 Click Score: 15    End of Session   Activity Tolerance: Patient limited by pain Patient left: in bed;with call bell/phone within reach;with bed alarm set Nurse Communication: Mobility status PT Visit Diagnosis: Other abnormalities of gait and mobility (R26.89);Pain Pain - part of body: (back)    Time: 5409-8119 PT Time  Calculation (min) (ACUTE ONLY): 24 min   Charges:   PT Evaluation $PT Eval Moderate Complexity: 1 Mod PT Treatments $Therapeutic Activity: 8-22 mins        Arletta Bale, DPT  Acute Rehabilitation Services Pager [phone] Office [phone]    Amy Weiss 12/24/2019, 12:54 PM

## 2019-12-24 NOTE — Telephone Encounter (Signed)
-----   Message from Corrin Parker sent at 12/24/2019  8:46 AM EST ----- Regarding: Return Call Contact: [phone] Patient's daughter, Alinda Sierras, requesting a call back. She said Claudene was admitted to [facility] yesterday and they are suggesting a kyphoplasty be performed. She is asking if you agree with this procedure and if you have any surgeons you would recommend. She also is asking for your opinion on inpatient physical therapy vs skilled nursing

## 2019-12-24 NOTE — Progress Notes (Signed)
Orthopedic Tech Progress Note Patient Details:  Amy Weiss 09-Jan-1935 [id number]  Ortho Devices Type of Ortho Device: Soft collar   Post Interventions Instructions Provided: Care of device   [name] 12/24/2019, 10:02 AM

## 2019-12-24 NOTE — Assessment & Plan Note (Signed)
Virtual visit, participants Amy Weiss (patient's caregiver) and 2 of her daughters. Back pain: Symptoms started 11/16/2019, I saw her virtually and subsequently saw sports medicine, x-ray done 11/25/2019 showed age indeterminant L1 fracture. She was treated conservatively. The family does not like to use extra medications due to dizziness and confusion, consequently she is not taking hydrocodone, baclofen or Flexeril. Up to 5 days ago she was improving and was able to take small walks around the neighborhood but then she become bed ridden suddenly.  She is unable to get up due to excruciating back pain. P.o. increase has decreased. Plan: Needs for their evaluation today, recommend to be transported at the ER possibly admitted depending on the ER work-up. -This was discussed with the patient which was very reluctant to go to the emergency room. -I also explained this to the caregiving each of the daughters independently. Addendum: -I spoke with the ER doctors twice, she has a new vertebral compression fracture, neurosurgery a TLSO and outpatient visit with them. At this point at 5 PM they are trying the brace, providing some pain medications and see if she can take some steps on her own before the final decision to be discharge is made. I also spoke with the daughter Amy Weiss again, she is not optimistic about getting all the help she needs if she is discharged home. PT has been already order by sports medicine, I offer them to get some additional help (palliative care?)  .

## 2019-12-24 NOTE — H&P (Signed)
History and Physical    Amy Weiss GHW:[id number] DOB: 30-Jun-1935 DOA: 12/23/2019  PCP: [name], MD  Patient coming from: Home.  Chief Complaint: Low back pain.  History obtained from patient's daughter and patient.  Patient has some memory issues.  HPI: Amy Weiss is a 85 y.o. female with history of hypothyroidism and memory issues, SVT status post ablation with chronic bradycardia who had low back pain around last Christmas about 40 days ago and had followed up with urgent care was found to have lumbar compression fracture the following week and was treated with pain medication did well for the next 3 weeks.  Last week patient started again having back pain which has been worse than before with increasing pain unable to do her routine things and was brought to the ER admits in [facility].  Not sure if patient had a fall.  ED Course: In the ER CT lumbar spine shows acute L1 compression fracture and an age-indeterminate T12 compression fracture.  Neurosurgeon on-call was contacted and at this time requested TSLO brace and follow-up as outpatient.  But since patient was not able to ambulate with severe pain was given hydrocodone and admitted for further observation.  Labs were largely unremarkable.  EKG showed bradycardia which patient's daughter states has been chronic.  Covid test was negative.  Review of Systems: As per HPI, rest all negative.   Past Medical History:  Diagnosis Date  . Diabetes mellitus 4/09   A1C-6  . Familial tremor   . Hyperlipidemia   . Hypothyroidism   . Osteoporosis   . Tachycardia    AV Re-entry, s/p ablation aprox 2005  . TIA (transient ischemic attack) 12/09    Past Surgical History:  Procedure Laterality Date  . ABDOMINAL HYSTERECTOMY     no oophorectomy  . BLADDER SURGERY     x 2 in the 80s  . BREAST BIOPSY     L (-)     reports that she quit smoking about 7 years ago. She has never used smokeless tobacco. She reports current  alcohol use. She reports that she does not use drugs.  Allergies  Allergen Reactions  . Sulfonamide Derivatives Other (See Comments)    Unknown    Family History  Problem Relation Age of Onset  . Pancreatic cancer Brother   . Pancreatic cancer Father   . Coronary artery disease Mother 50  . Breast cancer Neg Hx   . Colon cancer Neg Hx     Prior to Admission medications   Medication Sig Start Date End Date Taking? Authorizing Provider  aspirin 81 MG tablet Take 162 mg by mouth daily.    [provider]  Calcium-Vitamin D (CALTRATE 600 PLUS-VIT D PO) Take by mouth. 1 a day    [provider]  hydrocortisone 2.5 % cream Apply topically 2 (two) times daily. 12/15/19   [name], MD  levothyroxine (SYNTHROID) 88 MCG tablet Take 1 tablet (88 mcg total) by mouth daily before breakfast. 08/11/19   [name], MD  Multiple Vitamin (MULITIVITAMIN WITH MINERALS) TABS Take 1 tablet by mouth daily.    [provider]  pravastatin (PRAVACHOL) 80 MG tablet Take 1 tablet (80 mg total) by mouth daily. Patient not taking: Reported on 12/23/2019 04/18/18   [name], MD  senna (SENOKOT) 8.6 MG tablet Take 1 tablet by mouth daily as needed.     [provider]  TURMERIC PO Take 1 tablet  by mouth daily.    [provider]    Physical Exam: Constitutional: Moderately built and nourished. Vitals:   12/23/19 2022 12/24/19 0000 12/24/19 0001 12/24/19 0116  BP: (!) 148/62 (!) 145/65  (!) 153/73  Pulse: (!) 45 (!) 42  (!) 43  Resp: 18 16  17   Temp: 98.5 F (36.9 C) 98.5 F (36.9 C) 98.5 F (36.9 C) 97.7 F (36.5 C)  TempSrc: Oral Oral Oral   SpO2: 98% 95%  99%  Weight:      Height:       Eyes: Anicteric no pallor. ENMT: No discharge from the ears eyes nose or mouth. Neck: No masses.  No neck rigidity. Respiratory: No rhonchi or crepitations. Cardiovascular: S1-S2 heard. Abdomen: Soft nontender bowel sounds present. Musculoskeletal: No edema.   Pain on moving her legs. Skin: No rash. Neurologic: Alert awake oriented time place and person as some memory issues.  Moves all extremities. Psychiatric: Appears normal.   Labs on Admission: I have personally reviewed following labs and imaging studies  CBC: Recent Labs  Lab 12/23/19 1329  WBC 8.1  NEUTROABS 5.3  HGB 15.7*  HCT 47.7*  MCV 91.2  PLT 271   Basic Metabolic Panel: Recent Labs  Lab 12/23/19 1329  NA 138  K 4.2  CL 107  CO2 24  GLUCOSE 95  BUN 23  CREATININE 0.61  CALCIUM 9.3   GFR: Estimated Creatinine Clearance: 52.7 mL/min (by C-G formula based on SCr of 0.61 mg/dL). Liver Function Tests: Recent Labs  Lab 12/23/19 1329  AST 22  ALT 11  ALKPHOS 88  BILITOT 0.6  PROT 7.4  ALBUMIN 3.6   No results for input(s): LIPASE, AMYLASE in the last 168 hours. No results for input(s): AMMONIA in the last 168 hours. Coagulation Profile: No results for input(s): INR, PROTIME in the last 168 hours. Cardiac Enzymes: No results for input(s): CKTOTAL, CKMB, CKMBINDEX, TROPONINI in the last 168 hours. BNP (last 3 results) No results for input(s): PROBNP in the last 8760 hours. HbA1C: No results for input(s): HGBA1C in the last 72 hours. CBG: No results for input(s): GLUCAP in the last 168 hours. Lipid Profile: No results for input(s): CHOL, HDL, LDLCALC, TRIG, CHOLHDL, LDLDIRECT in the last 72 hours. Thyroid Function Tests: No results for input(s): TSH, T4TOTAL, FREET4, T3FREE, THYROIDAB in the last 72 hours. Anemia Panel: No results for input(s): VITAMINB12, FOLATE, FERRITIN, TIBC, IRON, RETICCTPCT in the last 72 hours. Urine analysis:    Component Value Date/Time   COLORURINE YELLOW 12/23/2019 1420   APPEARANCEUR CLEAR 12/23/2019 1420   LABSPEC 1.020 12/23/2019 1420   PHURINE 6.0 12/23/2019 1420   GLUCOSEU NEGATIVE 12/23/2019 1420   GLUCOSEU NEGATIVE 10/13/2014 1615   HGBUR NEGATIVE 12/23/2019 1420   BILIRUBINUR NEGATIVE 12/23/2019 1420    KETONESUR 15 (A) 12/23/2019 1420   PROTEINUR NEGATIVE 12/23/2019 1420   UROBILINOGEN 0.2 10/13/2014 1615   NITRITE NEGATIVE 12/23/2019 1420   LEUKOCYTESUR NEGATIVE 12/23/2019 1420   Sepsis Labs: @LABRCNTIP (procalcitonin:4,lacticidven:4) ) Recent Results (from the past 240 hour(s))  SARS CORONAVIRUS 2 (TAT 6-24 HRS) Nasopharyngeal Nasopharyngeal Swab     Status: None   Collection Time: 12/23/19  5:44 PM   Specimen: Nasopharyngeal Swab  Result Value Ref Range Status   SARS Coronavirus 2 NEGATIVE NEGATIVE Final    Comment: (NOTE) SARS-CoV-2 target nucleic acids are NOT DETECTED. The SARS-CoV-2 RNA is generally detectable in upper and lower respiratory specimens during the acute phase of infection. Negative results do not  preclude SARS-CoV-2 infection, do not rule out co-infections with other pathogens, and should not be used as the sole basis for treatment or other patient management decisions. Negative results must be combined with clinical observations, patient history, and epidemiological information. The expected result is Negative. Fact Sheet for Patients: SugarRoll.be Fact Sheet for Healthcare Providers: https://www.woods-mathews.com/ This test is not yet approved or cleared by the [place] FDA and  has been authorized for detection and/or diagnosis of SARS-CoV-2 by FDA under an Emergency Use Authorization (EUA). This EUA will remain  in effect (meaning this test can be used) for the duration of the COVID-19 declaration under Section 56 4(b)(1) of the Act, 21 U.S.C. section 360bbb-3(b)(1), unless the authorization is terminated or revoked sooner. Performed at [facility], [place] [address] [place], [place] [address]      Radiological Exams on Admission: CT Lumbar Spine Wo Contrast  Result Date: 12/23/2019 CLINICAL DATA:  Low back pain. EXAM: CT LUMBAR SPINE WITHOUT CONTRAST TECHNIQUE: Multidetector CT imaging of the  lumbar spine was performed without intravenous contrast administration. Multiplanar CT image reconstructions were also generated. COMPARISON:  None. FINDINGS: Segmentation: 5 lumbar type vertebrae. Alignment: Normal. Vertebrae: Acute L1 vertebral body compression fracture with approximately 60% height loss and 6 mm of retropulsion of the superior posterior margin. Mild age-indeterminate T12 vertebral body compression fracture. No aggressive osseous lesion. Paraspinal and other soft tissues: No acute paraspinal abnormality. Abdominal aortic atherosclerosis. Mild abdominal aortic ectasia measuring 2.5 cm. Disc levels: Degenerative disc disease with disc height loss at L4-5. severe degenerative disease with disc height loss at L5-S1. Bilateral facet arthropathy at L5-S1. No significant foraminal stenosis. IMPRESSION: 1. Acute L1 vertebral body compression fracture with approximately 60% height loss and 6 mm of retropulsion of the superior posterior margin. 2. Mild age-indeterminate T12 vertebral body compression fracture. 3. 2.5 cm ectatic abdominal aorta, at risk for aneurysm development. Recommend follow-up aortic ultrasound in 5 years. This recommendation follows ACR consensus guidelines: White Paper of the ACR Incidental Findings Committee II on Vascular Findings. J Am Coll Radiol 2013; 30:160-109. 4.  Aortic Atherosclerosis (ICD10-I70.0). Electronically Signed   By: Kathreen Devoid   On: 12/23/2019 14:12    EKG: Independently reviewed.  Bradycardia.  Assessment/Plan Principal Problem:   Lumbar compression fracture (HCC) Active Problems:   Hypothyroidism   Bradycardia   Lumbar compression fracture, closed, initial encounter ([place])    1. Acute L1 compression fracture with age-indeterminate T12 compression fracture for which ER physician discussed with on-call neurosurgery who recommended TSO brace.  Despite which patient was having severe pain to move was admitted for further pain management.  Physical  therapy consult.  May reconsult neurosurgery in the morning. 2. Hypothyroidism on Synthroid. 3. History of chronic bradycardia per patient's daughter patient has been tolerating this for a long time.  Closely monitor.  Given the worsening pain with significant bradycardia will need inpatient status.   DVT prophylaxis: Lovenox. Code Status: DNR confirmed with patient's daughter. Family Communication: Patient's daughter. Disposition Plan: May need rehab. Consults called: ER physician discussed with neurosurgeon. Admission status: Inpatient.   Rise Patience MD [facility] Pager [phone].  If 7PM-7AM, please contact night-coverage [email or web address] Password [facility]  12/24/2019, 3:38 AM

## 2019-12-25 ENCOUNTER — Inpatient Hospital Stay (HOSPITAL_COMMUNITY): Payer: Medicare HMO

## 2019-12-25 MED ORDER — POLYETHYLENE GLYCOL 3350 17 G PO PACK
17.0000 g | PACK | Freq: Every day | ORAL | Status: DC
  Administered 2019-12-25 – 2019-12-30 (×6): 17 g via ORAL
  Filled 2019-12-25 (×6): qty 1

## 2019-12-25 NOTE — Progress Notes (Signed)
PROGRESS NOTE    Amy Weiss  QJJ:[id number] DOB: Nov 29, 1934 DOA: 12/23/2019 PCP: [name], MD    Brief Narrative:  85 year old female with history of hypothyroidism and memory issues, SVT status post ablation with chronic sinus bradycardia, ongoing back pain issues with multiple interventions for last 1 month presented to ER with worsening pain and unable to ambulate.  Patient has some forgetfulness.  She lives alone.  Last week or so she has been more bedbound due to cold and had worsening pain. [facility].  Lumbar spine CT scan showed acute L1 compression fracture and age-indeterminate T12 compression fracture.  Neurosurgery was consulted who requested T SLO brace and mobility.  Patient could not walk hence admitted to the hospital.   Assessment & Plan:   Principal Problem:   Lumbar compression fracture (HCC) Active Problems:   Hypothyroidism   Bradycardia   Lumbar compression fracture, closed, initial encounter (HCC)  Severe intractable back pain with multilevel lumbar compression fracture: L1 compression fracture likely subacute/ acute.  She also has T12 fracture. She is a probable candidate for kyphoplasty. Waiting for MRI lumbar spine and to be seen by interventional radiology after MRI. Continue to work with PT OT.  Adequate pain medications.  Fall precautions. Refer to inpatient rehab for therapies. Bowel regimen.  Encourage eating.  Hypothyroidism: On Synthroid.  Chronic bradycardia: Stable.   DVT prophylaxis: SCDs.   Code Status: DNR. Family Communication: Patient's daughter Ms. Amy Weiss. Disposition Plan: patient is from home. Anticipated DC to inpatient rehab versus skilled nursing rehab, Barriers to discharge on active treatment.  Patient is in too much pain to stand up and walk and unsafe to discharge home today.  Anticipating MRI lumbar spine and inpatient kyphoplasty.  Consultants:   Interventional radiology  Procedures:    None  Antimicrobials:   None   Subjective: Patient seen and examined.  No overnight events.  Last night she was taken to MRI table and she declined to have it came back. No other overnight events.  She would not wanting to get up from the bed because of the pain. She is agreeable to go for MRI today.  Objective: Vitals:   12/24/19 0813 12/24/19 1401 12/24/19 1932 12/25/19 0811  BP: (!) 148/62 (!) 149/66 (!) 164/76 (!) 107/54  Pulse: (!) 40 (!) 42 (!) 46 66  Resp: 17 18 18 18   Temp: 97.8 F (36.6 C) 98.1 F (36.7 C) 97.9 F (36.6 C) 98.5 F (36.9 C)  TempSrc: Oral Oral  Oral  SpO2: 100% 99% 97% 97%  Weight:      Height:        Intake/Output Summary (Last 24 hours) at 12/25/2019 0953 Last data filed at 12/24/2019 1945 Gross per 24 hour  Intake 120 ml  Output --  Net 120 ml   Filed Weights   12/23/19 1327  Weight: 62.5 kg    Examination:  Physical Exam  Constitutional:  Patient is alert and oriented x1.  She is pleasantly confused.  She knows most of the things, however has no short-term memory.  Denies any pain at rest.  HENT:  Head: Normocephalic and atraumatic.  Eyes: Pupils are equal, round, and reactive to light.  Cardiovascular: Normal rate and regular rhythm.  Pulmonary/Chest: Breath sounds normal.  Abdominal: Bowel sounds are normal.  Musculoskeletal:        General: No tenderness, deformity or edema.     Cervical back: Neck supple.     Comments: No localized  tenderness or deformity on physical exam.     Data Reviewed: I have personally reviewed following labs and imaging studies  CBC: Recent Labs  Lab 12/23/19 1329 12/24/19 0431  WBC 8.1 10.2  NEUTROABS 5.3  --   HGB 15.7* 14.2  HCT 47.7* 42.1  MCV 91.2 89.2  PLT 271 253   Basic Metabolic Panel: Recent Labs  Lab 12/23/19 1329 12/24/19 0431  NA 138 141  K 4.2 4.1  CL 107 110  CO2 24 23  GLUCOSE 95 109*  BUN 23 18  CREATININE 0.61 0.59  CALCIUM 9.3 8.8*   GFR: Estimated  Creatinine Clearance: 52.7 mL/min (by C-G formula based on SCr of 0.59 mg/dL). Liver Function Tests: Recent Labs  Lab 12/23/19 1329  AST 22  ALT 11  ALKPHOS 88  BILITOT 0.6  PROT 7.4  ALBUMIN 3.6   No results for input(s): LIPASE, AMYLASE in the last 168 hours. No results for input(s): AMMONIA in the last 168 hours. Coagulation Profile: No results for input(s): INR, PROTIME in the last 168 hours. Cardiac Enzymes: No results for input(s): CKTOTAL, CKMB, CKMBINDEX, TROPONINI in the last 168 hours. BNP (last 3 results) No results for input(s): PROBNP in the last 8760 hours. HbA1C: No results for input(s): HGBA1C in the last 72 hours. CBG: No results for input(s): GLUCAP in the last 168 hours. Lipid Profile: No results for input(s): CHOL, HDL, LDLCALC, TRIG, CHOLHDL, LDLDIRECT in the last 72 hours. Thyroid Function Tests: No results for input(s): TSH, T4TOTAL, FREET4, T3FREE, THYROIDAB in the last 72 hours. Anemia Panel: No results for input(s): VITAMINB12, FOLATE, FERRITIN, TIBC, IRON, RETICCTPCT in the last 72 hours. Sepsis Labs: No results for input(s): PROCALCITON, LATICACIDVEN in the last 168 hours.  Recent Results (from the past 240 hour(s))  SARS CORONAVIRUS 2 (TAT 6-24 HRS) Nasopharyngeal Nasopharyngeal Swab     Status: None   Collection Time: 12/23/19  5:44 PM   Specimen: Nasopharyngeal Swab  Result Value Ref Range Status   SARS Coronavirus 2 NEGATIVE NEGATIVE Final    Comment: (NOTE) SARS-CoV-2 target nucleic acids are NOT DETECTED. The SARS-CoV-2 RNA is generally detectable in upper and lower respiratory specimens during the acute phase of infection. Negative results do not preclude SARS-CoV-2 infection, do not rule out co-infections with other pathogens, and should not be used as the sole basis for treatment or other patient management decisions. Negative results must be combined with clinical observations, patient history, and epidemiological information. The  expected result is Negative. Fact Sheet for Patients: HairSlick.no Fact Sheet for Healthcare Providers: quierodirigir.com This test is not yet approved or cleared by the [place] FDA and  has been authorized for detection and/or diagnosis of SARS-CoV-2 by FDA under an Emergency Use Authorization (EUA). This EUA will remain  in effect (meaning this test can be used) for the duration of the COVID-19 declaration under Section 56 4(b)(1) of the Act, 21 U.S.C. section 360bbb-3(b)(1), unless the authorization is terminated or revoked sooner. Performed at [facility], [address] [place], [place] [address]          Radiology Studies: CT Lumbar Spine Wo Contrast  Result Date: 12/23/2019 CLINICAL DATA:  Low back pain. EXAM: CT LUMBAR SPINE WITHOUT CONTRAST TECHNIQUE: Multidetector CT imaging of the lumbar spine was performed without intravenous contrast administration. Multiplanar CT image reconstructions were also generated. COMPARISON:  None. FINDINGS: Segmentation: 5 lumbar type vertebrae. Alignment: Normal. Vertebrae: Acute L1 vertebral body compression fracture with approximately 60% height loss  and 6 mm of retropulsion of the superior posterior margin. Mild age-indeterminate T12 vertebral body compression fracture. No aggressive osseous lesion. Paraspinal and other soft tissues: No acute paraspinal abnormality. Abdominal aortic atherosclerosis. Mild abdominal aortic ectasia measuring 2.5 cm. Disc levels: Degenerative disc disease with disc height loss at L4-5. severe degenerative disease with disc height loss at L5-S1. Bilateral facet arthropathy at L5-S1. No significant foraminal stenosis. IMPRESSION: 1. Acute L1 vertebral body compression fracture with approximately 60% height loss and 6 mm of retropulsion of the superior posterior margin. 2. Mild age-indeterminate T12 vertebral body compression fracture. 3. 2.5 cm ectatic  abdominal aorta, at risk for aneurysm development. Recommend follow-up aortic ultrasound in 5 years. This recommendation follows ACR consensus guidelines: White Paper of the ACR Incidental Findings Committee II on Vascular Findings. J Am Coll Radiol 2013; 94:496-759. 4.  Aortic Atherosclerosis (ICD10-I70.0). Electronically Signed   By: Kathreen Devoid   On: 12/23/2019 14:12        Scheduled Meds: . aspirin  162 mg Oral Daily  .  HYDROmorphone (DILAUDID) injection  0.5 mg Intravenous Once  . levothyroxine  88 mcg Oral QAC breakfast  . polyethylene glycol  17 g Oral Daily   Continuous Infusions:   LOS: 2 days    Time spent: 25 minutes    Barb Merino, MD [facility] Pager [phone]

## 2019-12-25 NOTE — Progress Notes (Signed)
Spoke to dr. Jerral Ralph about pt HR 38 per tele reading. No new orders. Per Dr. Jerral Ralph pt condition is chronic.

## 2019-12-25 NOTE — Progress Notes (Signed)
Inpatient Rehab Admissions:  Inpatient Rehab Consult received.  Note therapies recommending SNF at this time, and pt pending MRI for possible kyphoplasty.  Will follow from a distance for treatment plan and next therapy session to determine best rehab venue for pt.   Signed: Estill Dooms, PT, DPT Admissions Coordinator [phone] 12/25/19  10:47 AM

## 2019-12-25 NOTE — Progress Notes (Signed)
Pt refuses to have telemetry box on. Safety mittens are not  useful; they make patient more irritable and confused. [facility] notified. Will continue to encourage pt to keep the leads on.

## 2019-12-25 NOTE — Evaluation (Signed)
Occupational Therapy Evaluation Patient Details Name: Amy Weiss MRN: [id number] DOB: May 16, 1935 Today's Date: 12/25/2019    History of Present Illness Pt is an 85 y/o female admitted secondary to back pain and found to have an L1 compression fx. Neurosurgery was consulted and recommended a TLSO. PMH including but not limited to DM, HTN and TIA.   Clinical Impression   Pt admitted with above and presents to OT with impairments impacting ability to complete ADLs at [facility].  Pt limited by pain with mobility, able to come to sitting at EOB on eval with min assist.  However pt unable to maintain sitting EOB due to increased pain and refusal to attempt any further activity.  Pt adamant about not going to SNF or CIR for therapies, expressing desire to d/c home.  Pt could d/c home if she is able to aquire 24/7 physical assistance from family friends as pt lives alone.  Current recommendation in SNF for further intensive therapy services to maximize her independence with ADLs and functional mobility.  Pt would continue to benefit form OT services acutely to continue to address deficits and decrease burden of care.    Follow Up Recommendations  Supervision/Assistance - 24 hour;SNF    Equipment Recommendations  3 in 1 bedside commode;Tub/shower bench       Precautions / Restrictions Precautions Precautions: Fall;Back Required Braces or Orthoses: Spinal Brace Spinal Brace: Thoracolumbosacral orthotic Restrictions Weight Bearing Restrictions: No      Mobility Bed Mobility Overal bed mobility: Needs Assistance Bed Mobility: Rolling Rolling: Min assist         General bed mobility comments: cueing for log roll technique, pt able to achieve sidelying towards her L side with use of bed rails and min A; therapist assisting bilateral LEs off of bed and then began to assist pt's trunk into upright sitting. Pt able to achieve full upright sitting at EOB this session, but due to increased pain pt  unable to remain seated at EOB.  Returned to supine with min assist  Transfers                 General transfer comment: not attempted as pt with increased pain in sitting and refusal to attempt any OOB movement        ADL either performed or assessed with clinical judgement   ADL Overall ADL's : Needs assistance/impaired     Grooming: Wash/dry hands;Wash/dry face;Bed level;Set up                                 General ADL Comments: Pt unable to tolerate any OOB activity.  Min assist to come to sitting at EOB, however with increased pain and refusing to remain at EOB for any activity.  Therefore at this point, would require bed level for bathing and dressing.     Vision Baseline Vision/History: Wears glasses Wears Glasses: Reading only Patient Visual Report: No change from baseline Vision Assessment?: No apparent visual deficits            Pertinent Vitals/Pain Pain Assessment: Faces Faces Pain Scale: Hurts whole lot Pain Location: back Pain Descriptors / Indicators: Grimacing;Guarding Pain Intervention(s): Limited activity within patient's tolerance;Monitored during session;Repositioned     Hand Dominance Left   Extremity/Trunk Assessment Upper Extremity Assessment Upper Extremity Assessment: Generalized weakness   Lower Extremity Assessment Lower Extremity Assessment: Generalized weakness   Cervical / Trunk Assessment Cervical / Trunk Assessment: Other  exceptions Cervical / Trunk Exceptions: L1 compression fx   Communication Communication Communication: No difficulties   Cognition Arousal/Alertness: Awake/alert Behavior During Therapy: WFL for tasks assessed/performed;Anxious Overall Cognitive Status: Impaired/Different from baseline Area of Impairment: Memory;Safety/judgement;Problem solving                     Memory: Decreased short-term memory;Decreased recall of precautions   Safety/Judgement: Decreased awareness of  deficits;Decreased awareness of safety   Problem Solving: Difficulty sequencing;Requires verbal cues                Home Living Family/patient expects to be discharged to:: Private residence Living Arrangements: Alone Available Help at Discharge: Family;Friend(s);Available 24 hours/day Type of Home: House Home Access: Stairs to enter [employer] of Steps: 1 Entrance Stairs-Rails: None Home Layout: One level     Bathroom Shower/Tub: Tub/shower unit         Home Equipment: None          Prior Functioning/Environment Level of Independence: Independent        Comments: drives        OT Problem List: Decreased range of motion;Decreased activity tolerance;Impaired balance (sitting and/or standing);Decreased knowledge of precautions;Pain      OT Treatment/Interventions: Self-care/ADL training;DME and/or AE instruction;Therapeutic activities;Patient/family education;Balance training    OT Goals(Current goals can be found in the care plan section) Acute Rehab OT Goals Patient Stated Goal: decrease pain OT Goal Formulation: With patient Time For Goal Achievement: 01/08/20 Potential to Achieve Goals: Good  OT Frequency: Min 2X/week   Barriers to D/C: Decreased caregiver support  Pt lives alone, would require 24/7 physical assist          AM-PAC OT "6 Clicks" Daily Activity     Outcome Measure Help from another person eating meals?: None Help from another person taking care of personal grooming?: A Little Help from another person toileting, which includes using toliet, bedpan, or urinal?: A Lot Help from another person bathing (including washing, rinsing, drying)?: A Lot Help from another person to put on and taking off regular upper body clothing?: A Lot Help from another person to put on and taking off regular lower body clothing?: A Lot 6 Click Score: 15   End of Session Nurse Communication: Mobility status  Activity Tolerance: Patient limited  by pain Patient left: in bed;with call bell/phone within reach;with bed alarm set  OT Visit Diagnosis: Pain Pain - part of body: (back)                Time: 8366-2947 OT Time Calculation (min): 16 min Charges:  OT General Charges $OT Visit: 1 Visit OT Evaluation $OT Eval Moderate Complexity: 1 Mod   Edwyn Inclan, [facility] [phone] 12/25/2019, 9:31 AM

## 2019-12-25 NOTE — Plan of Care (Signed)
  Problem: Nutrition: Goal: Adequate nutrition will be maintained Outcome: Progressing   Problem: Safety: Goal: Ability to remain free from injury will improve Outcome: Progressing   

## 2019-12-26 LAB — URINALYSIS, ROUTINE W REFLEX MICROSCOPIC
Bilirubin Urine: NEGATIVE
Glucose, UA: NEGATIVE mg/dL
Hgb urine dipstick: NEGATIVE
Ketones, ur: NEGATIVE mg/dL
Nitrite: NEGATIVE
Protein, ur: NEGATIVE mg/dL
Specific Gravity, Urine: 1.011 (ref 1.005–1.030)
pH: 6 (ref 5.0–8.0)

## 2019-12-26 LAB — PROTIME-INR
INR: 1 (ref 0.8–1.2)
Prothrombin Time: 13.1 seconds (ref 11.4–15.2)

## 2019-12-26 MED ORDER — LIDOCAINE 5 % EX PTCH
1.0000 | MEDICATED_PATCH | CUTANEOUS | Status: DC
  Administered 2019-12-26 – 2019-12-31 (×6): 1 via TRANSDERMAL
  Filled 2019-12-26 (×6): qty 1

## 2019-12-26 MED ORDER — CYCLOBENZAPRINE HCL 10 MG PO TABS
5.0000 mg | ORAL_TABLET | Freq: Three times a day (TID) | ORAL | Status: DC | PRN
  Administered 2019-12-26 – 2019-12-31 (×4): 5 mg via ORAL
  Filled 2019-12-26 (×5): qty 1

## 2019-12-26 NOTE — Progress Notes (Signed)
Physical Therapy Treatment Patient Details Name: Amy Weiss MRN: [id number] DOB: Nov 30, 1934 Today's Date: 12/26/2019    History of Present Illness Pt is an 85 y/o female admitted secondary to back pain and found to have an L1 compression fx. Neurosurgery was consulted and recommended a TLSO. PMH including but not limited to DM, HTN and TIA.    PT Comments    Pt making steady progress with functional mobility. She tolerated short distance ambulation into hallway with min guard to min A for stability. [name] present throughout session, very attentive and encouraging. PT will continue to follow pt acutely to progress mobility as tolerated. Per daughter, plan is for kyphoplasty on Monday. Hopeful that pt will qualify for CIR afterwards.      Follow Up Recommendations  SNF;Other (comment)(vs CIR pending kyphoplasty)     Equipment Recommendations  None recommended by PT    Recommendations for Other Services       Precautions / Restrictions Precautions Precautions: Fall;Back Required Braces or Orthoses: Spinal Brace Spinal Brace: Thoracolumbosacral orthotic(was not present) Restrictions Weight Bearing Restrictions: No    Mobility  Bed Mobility Overal bed mobility: Needs Assistance Bed Mobility: Rolling;Sit to Sidelying;Sidelying to Sit Rolling: Min assist Sidelying to sit: Min assist     Sit to sidelying: Min assist General bed mobility comments: cueing for log roll technique, use of bed rails, min A for trunk elevation and for bilateral LEs to return to bed  Transfers Overall transfer level: Needs assistance Equipment used: Rolling walker (2 wheeled) Transfers: Sit to/from Stand Sit to Stand: From elevated surface;Min assist;+2 safety/equipment         General transfer comment: cueing for safe hand placement, assistance for stability with transition  Ambulation/Gait Ambulation/Gait assistance: Min guard;Min assist Gait Distance (Feet): 50 Feet Assistive  device: Rolling walker (2 wheeled) Gait Pattern/deviations: Step-to pattern;Step-through pattern;Decreased step length - right;Decreased step length - left;Decreased stride length Gait velocity: decreased   General Gait Details: pt with mild instability with RW, requiring close contact min guard and intermittent min A; pt requiring several standing rest breaks secondary to pain   Stairs             Wheelchair Mobility    Modified Rankin (Stroke Patients Only)       Balance Overall balance assessment: Needs assistance Sitting-balance support: Feet supported Sitting balance-Leahy Scale: Fair     Standing balance support: Bilateral upper extremity supported;Single extremity supported Standing balance-Leahy Scale: Poor                              Cognition Arousal/Alertness: Awake/alert Behavior During Therapy: WFL for tasks assessed/performed;Anxious Overall Cognitive Status: Impaired/Different from baseline Area of Impairment: Memory;Safety/judgement;Problem solving                     Memory: Decreased short-term memory;Decreased recall of precautions   Safety/Judgement: Decreased awareness of deficits;Decreased awareness of safety   Problem Solving: Difficulty sequencing;Requires verbal cues        Exercises      General Comments        Pertinent Vitals/Pain Pain Assessment: Faces Faces Pain Scale: Hurts whole lot Pain Location: back Pain Descriptors / Indicators: Grimacing;Guarding Pain Intervention(s): Monitored during session;Repositioned;Patient requesting pain meds-RN notified;RN gave pain meds during session    Home Living                      Prior  Function            PT Goals (current goals can now be found in the care plan section) Acute Rehab PT Goals PT Goal Formulation: With patient Time For Goal Achievement: 01/07/20 Potential to Achieve Goals: Fair Progress towards PT goals: Progressing toward  goals    Frequency    Min 2X/week      PT Plan Current plan remains appropriate    Co-evaluation              AM-PAC PT "6 Clicks" Mobility   Outcome Measure  Help needed turning from your back to your side while in a flat bed without using bedrails?: A Little Help needed moving from lying on your back to sitting on the side of a flat bed without using bedrails?: A Little Help needed moving to and from a bed to a chair (including a wheelchair)?: A Little Help needed standing up from a chair using your arms (e.g., wheelchair or bedside chair)?: A Little Help needed to walk in hospital room?: A Little Help needed climbing 3-5 steps with a railing? : A Lot 6 Click Score: 17    End of Session   Activity Tolerance: Patient limited by pain Patient left: in bed;with call bell/phone within reach;with family/visitor present Nurse Communication: Mobility status PT Visit Diagnosis: Other abnormalities of gait and mobility (R26.89);Pain Pain - part of body: (back)     Time: 6301-6010 PT Time Calculation (min) (ACUTE ONLY): 41 min  Charges:  $Gait Training: 8-22 mins $Therapeutic Activity: 23-37 mins                     Anastasio Champion, DPT  Acute Rehabilitation Services Pager [phone] Office [place] 12/26/2019, 1:09 PM

## 2019-12-26 NOTE — Progress Notes (Signed)
Pt/daughter both refused for lab work to done at this time: lab will attempt on dayshift

## 2019-12-26 NOTE — Consult Note (Signed)
Chief Complaint: Patient was seen in consultation today for T12 and L1 compression fractures/vertebral augmentation.  Referring Physician(s): Dorcas Carrow  Supervising Physician: Julieanne Cotton  Patient Status: [facility] - In-pt  History of Present Illness: Amy Weiss is a 85 y.o. female with a past medical history of hyperlipidemia, TIA 2009, dementia, diabetes mellitus, hypothyroidism, osteoporosis, and familial tremor. She presented to [facility] ED 12/23/2019 with complaints of low back pain. CT lumbar spine revealed T12 (age indeterminate) and L1 (acute) compression fractures. Neurosurgery was consulted and recommended TSLO brace and discharge home. However, patient was unable to ambulate, so she was transferred and admitted to [facility] for pain control. MR lumbar spine revealed that T12 and L1 compression fractures appear acute.  CT lumbar spine 12/23/2019: 1. Acute L1 vertebral body compression fracture with approximately 60% height loss and 6 mm of retropulsion of the superior posterior margin. 2. Mild age-indeterminate T12 vertebral body compression fracture. 3. 2.5 cm ectatic abdominal aorta, at risk for aneurysm development. Recommend follow-up aortic ultrasound in 5 years. This recommendation follows ACR consensus guidelines: White Paper of the ACR Incidental Findings Committee II on Vascular Findings. J Am Coll Radiol 2013; 28:638-177. 4. Aortic Atherosclerosis (ICD10-I70.0).  MR lumbar spine 12/25/2019: 1. Mild compression fracture T12 appears acute. 2. Moderate compression fracture L1 appears acute. Mild retropulsion of the superior endplate of L1 into the canal without significant stenosis. 3. Mild lumbar degenerative change without spinal stenosis.  IR requested by Dr. Jerral Ralph for possible image-guided T12 and L1 kyphoplasty/vertebroplasty. Patient awake and alert laying in bed. Accompanied by daughter, Amy Weiss, at bedside. She appears pleasantly confused- she is able to  answer most questions, however will repeat the same question throughout conversation. Complains of low back pain, rated 8/10 at this time. History difficult to obtain due to confusion.   Past Medical History:  Diagnosis Date  . Diabetes mellitus 4/09   A1C-6  . Familial tremor   . Hyperlipidemia   . Hypothyroidism   . Osteoporosis   . Tachycardia    AV Re-entry, s/p ablation aprox 2005  . TIA (transient ischemic attack) 12/09    Past Surgical History:  Procedure Laterality Date  . ABDOMINAL HYSTERECTOMY     no oophorectomy  . BLADDER SURGERY     x 2 in the 80s  . BREAST BIOPSY     L (-)    Allergies: Sulfonamide derivatives  Medications: Prior to Admission medications   Medication Sig Start Date End Date Taking? Authorizing Provider  aspirin 81 MG tablet Take 81 mg by mouth daily.    Yes [provider]  Calcium-Vitamin D (CALTRATE 600 PLUS-VIT D PO) Take 1 tablet by mouth daily. 1 a day    Yes [provider]  hydrocortisone 2.5 % cream Apply topically 2 (two) times daily. 12/15/19  Yes [name], MD  levothyroxine (SYNTHROID) 88 MCG tablet Take 1 tablet (88 mcg total) by mouth daily before breakfast. 08/11/19  Yes Paz, Nolon Rod, MD  Multiple Vitamin (MULITIVITAMIN WITH MINERALS) TABS Take 1 tablet by mouth daily.   Yes [provider]  senna (SENOKOT) 8.6 MG tablet Take 1 tablet by mouth daily as needed for constipation.    Yes [provider]  TURMERIC PO Take 1 tablet by mouth daily.   Yes [provider]  pravastatin (PRAVACHOL) 80 MG tablet Take 1 tablet (80 mg total) by mouth daily. Patient not taking: Reported on 12/23/2019 04/18/18   [name], MD  Family History  Problem Relation Age of Onset  . Pancreatic cancer Brother   . Pancreatic cancer Father   . Coronary artery disease Mother 75  . Breast cancer Neg Hx   . Colon cancer Neg Hx     Social History   Socioeconomic History  . Marital status: Divorced     Spouse name: Not on file  . Number of children: 4  . Years of education: Not on file  . Highest education level: Not on file  Occupational History  . 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 Smoking status: Former Smoker    Quit date: 06/20/2012    Years since quitting: 7.5  . Smokeless tobacco: Never Used  Substance and Sexual Activity  . Alcohol use: Yes    Comment: beer rarely  . Drug use: No  . Sexual activity: Never  Other Topics Concern  . Not on file  Social History Narrative   Single, live by herself, lost a daughter in law   Daughter Apolonio Schneiders lives close by   Ex- husband lives near   Social Determinants of Health   Financial Resource Strain:   . Difficulty of Paying Living Expenses: Not on file  Food Insecurity:   . Worried About Charity fundraiser in the Last Year: Not on file  . Ran Out of Food in the Last Year: Not on file  Transportation Needs:   . Lack of Transportation (Medical): Not on file  . Lack of Transportation (Non-Medical): Not on file  Physical Activity:   . Days of Exercise per Week: Not on file  . Minutes of Exercise per Session: Not on file  Stress:   . Feeling of Stress : Not on file  Social Connections:   . Frequency of Communication with Friends and Family: Not on file  . Frequency of Social Gatherings with Friends and Family: Not on file  . Attends Religious Services: Not on file  . Active Member of Clubs or Organizations: Not on file  . Attends Archivist Meetings: Not on file  . Marital Status: Not on file     Review of Systems: A 12 point ROS discussed and pertinent positives are indicated in the HPI above.  All other systems are negative.  Review of Systems  Unable to perform ROS: Dementia  Musculoskeletal: Positive for back pain.  Psychiatric/Behavioral: Positive for confusion.    Vital Signs: BP (!) 149/44 (BP Location: Left Arm)   Pulse (!) 42   Temp 97.6 F (36.4 C) (Oral)   Resp 18    Ht 5\' 7"  (1.702 m)   Wt 137 lb 11.2 oz (62.5 kg)   SpO2 99%   BMI 21.57 kg/m   Physical Exam Vitals and nursing note reviewed.  Constitutional:      General: She is not in acute distress.    Appearance: Normal appearance.  Cardiovascular:     Rate and Rhythm: Normal rate and regular rhythm.     Heart sounds: Normal heart sounds. No murmur.  Pulmonary:     Effort: Pulmonary effort is normal. No respiratory distress.     Breath sounds: Normal breath sounds. No wheezing.  Skin:    General: Skin is warm and dry.  Neurological:     Mental Status: She is alert.      MD Evaluation Airway: WNL Heart: WNL Abdomen: WNL Chest/ Lungs: WNL ASA  Classification: 2 Mallampati/Airway Score: Two   Imaging: CT Lumbar  Spine Wo Contrast  Result Date: 12/23/2019 CLINICAL DATA:  Low back pain. EXAM: CT LUMBAR SPINE WITHOUT CONTRAST TECHNIQUE: Multidetector CT imaging of the lumbar spine was performed without intravenous contrast administration. Multiplanar CT image reconstructions were also generated. COMPARISON:  None. FINDINGS: Segmentation: 5 lumbar type vertebrae. Alignment: Normal. Vertebrae: Acute L1 vertebral body compression fracture with approximately 60% height loss and 6 mm of retropulsion of the superior posterior margin. Mild age-indeterminate T12 vertebral body compression fracture. No aggressive osseous lesion. Paraspinal and other soft tissues: No acute paraspinal abnormality. Abdominal aortic atherosclerosis. Mild abdominal aortic ectasia measuring 2.5 cm. Disc levels: Degenerative disc disease with disc height loss at L4-5. severe degenerative disease with disc height loss at L5-S1. Bilateral facet arthropathy at L5-S1. No significant foraminal stenosis. IMPRESSION: 1. Acute L1 vertebral body compression fracture with approximately 60% height loss and 6 mm of retropulsion of the superior posterior margin. 2. Mild age-indeterminate T12 vertebral body compression fracture. 3. 2.5 cm  ectatic abdominal aorta, at risk for aneurysm development. Recommend follow-up aortic ultrasound in 5 years. This recommendation follows ACR consensus guidelines: White Paper of the ACR Incidental Findings Committee II on Vascular Findings. J Am Coll Radiol 2013; 25:427-062. 4.  Aortic Atherosclerosis (ICD10-I70.0). Electronically Signed   By: Elige Ko   On: 12/23/2019 14:12   MR LUMBAR SPINE WO CONTRAST  Result Date: 12/25/2019 CLINICAL DATA:  Lumbar compression fracture EXAM: MRI LUMBAR SPINE WITHOUT CONTRAST TECHNIQUE: Multiplanar, multisequence MR imaging of the lumbar spine was performed. No intravenous contrast was administered. COMPARISON:  CT 12/23/2019 FINDINGS: Segmentation:  Normal Alignment:  Normal Vertebrae: Mild superior endplate compression fracture of T12 with bone marrow edema. No change from the prior CT. Moderate compression fracture of L1 with approximately 50% loss of vertebral body height and diffuse bone marrow edema. Slight retropulsion of the superior endplate of L1 into the canal without significant stenosis. No change from recent CT. No other fracture or evidence of metastatic disease in the lumbar spine Conus medullaris and cauda equina: Conus extends to the L1-2 level. Conus and cauda equina appear normal. Paraspinal and other soft tissues: Negative Disc levels: T12-L1: Mild retropulsion of L1 into the spinal canal without significant spinal stenosis. L1-2: Mild disc degeneration without stenosis L2-3: Negative L3-4: Mild disc degeneration. Negative for disc protrusion or stenosis L4-5: Mild to moderate disc degeneration with disc space narrowing. Small central disc protrusion. Negative for stenosis L5-S1: Disc degeneration with disc space narrowing. Mild endplate spurring on the left without significant foraminal stenosis IMPRESSION: Mild compression fracture T12 appears acute Moderate compression fracture L1 appears acute. Mild retropulsion of the superior endplate of L1 into  the canal without significant stenosis Mild lumbar degenerative change without spinal stenosis. Electronically Signed   By: Marlan Palau M.D.   On: 12/25/2019 14:21    Labs:  CBC: Recent Labs    12/23/19 1329 12/24/19 0431  WBC 8.1 10.2  HGB 15.7* 14.2  HCT 47.7* 42.1  PLT 271 253    COAGS: Recent Labs    12/26/19 0814  INR 1.0    BMP: Recent Labs    12/23/19 1329 12/24/19 0431  NA 138 141  K 4.2 4.1  CL 107 110  CO2 24 23  GLUCOSE 95 109*  BUN 23 18  CALCIUM 9.3 8.8*  CREATININE 0.61 0.59  GFRNONAA >60 >60  GFRAA >60 >60    LIVER FUNCTION TESTS: Recent Labs    12/23/19 1329  BILITOT 0.6  AST 22  ALT 11  ALKPHOS 88  PROT 7.4  ALBUMIN 3.6     Assessment and Plan:  T12 and L1 compression fractures. Plan for image-guided T12 and L1 kyphoplasty/vertebroplasty tentatively for Monday 12/29/2019 with Dr. Corliss Skains. Procedure has been reviewed and approved by Dr. Corliss Skains. Patient will be NPO at midnight in anticipation for procedure. Afebrile and WBCs WNL. She does not take blood thinners. INR 1.0 today. UA reviewed by Dr. Corliss Skains who states ok to proceed with procedure at this time.  Risks and benefits of T12 and L1 kyphoplasty/vertebroplasty were discussed with the patient including, but not limited to education regarding the natural healing process of compression fractures without intervention, bleeding, infection, cement migration which may cause spinal cord damage, paralysis, pulmonary embolism or even death. This interventional procedure involves the use of X-rays and because of the nature of the planned procedure, it is possible that we will have prolonged use of X-ray fluoroscopy. Potential radiation risks to you include (but are not limited to) the following: - A slightly elevated risk for cancer  several years later in life. This risk is typically less than 0.5% percent. This risk is low in comparison to the normal incidence of human cancer,  which is 33% for women and 50% for men according to the American Cancer Society. - Radiation induced injury can include skin redness, resembling a rash, tissue breakdown / ulcers and hair loss (which can be temporary or permanent).  The likelihood of either of these occurring depends on the difficulty of the procedure and whether you are sensitive to radiation due to previous procedures, disease, or genetic conditions.  IF your procedure requires a prolonged use of radiation, you will be notified and given written instructions for further action.  It is your responsibility to monitor the irradiated area for the 2 weeks following the procedure and to notify your physician if you are concerned that you have suffered a radiation induced injury. All of the patient's questions were answered, patient is agreeable to proceed. Consent obtained by patient's daughter, Amy Sierras- signed and in IR control room.   Thank you for this interesting consult.  I greatly enjoyed meeting Shannie LEYLANI DULEY and look forward to participating in their care.  A copy of this report was sent to the requesting provider on this date.  Electronically Signed: Elwin Mocha, PA-C 12/26/2019, 2:52 PM   I spent a total of 40 Minutes in face to face in clinical consultation, greater than 50% of which was counseling/coordinating care for T12 and L1 compression fractures/vertebral augmentation.

## 2019-12-26 NOTE — Progress Notes (Signed)
Telemetry called heart rate at 32-33, and t wave depressed-pt sleeping- other vital signs stable

## 2019-12-26 NOTE — Progress Notes (Signed)
PROGRESS NOTE    Amy Weiss  TMH:[id number] DOB: [birth year] DOA: 12/23/2019 PCP: [name], MD    Brief Narrative:  85 year old female with history of hypothyroidism and memory issues, SVT status post ablation with chronic sinus bradycardia, ongoing back pain issues with multiple interventions for last 1 month presented to ER with worsening pain and unable to ambulate.  Patient has some forgetfulness.  She lives alone.  Last week or so she has been more bedbound due to cold and had worsening pain. [facility]  Lumbar spine CT scan showed acute L1 compression fracture and age-indeterminate T12 compression fracture.  Neurosurgery was consulted who requested T SLO brace and mobility.  Patient could not walk hence admitted to the hospital.   Assessment & Plan:   Principal Problem:   Lumbar compression fracture (HCC) Active Problems:   Hypothyroidism   Bradycardia   Lumbar compression fracture, closed, initial encounter (HCC)  Severe intractable back pain with multilevel lumbar compression fracture: Acute traumatic compression fracture of L1 and T12 vertebra.  Continues to have severe pain, unable to ambulate and bear weight and not safe to go home by herself. Increased dose of oxycodone, add muscle relaxants and local lidocaine patches. Continue to work with PT OT.  All-time fall precautions.  Bowel regimen. Seen by interventional radiology, trying to schedule for kyphoplasty. She will need skilled nursing facility placement after the procedure.  Hypothyroidism: On Synthroid.  Stable.  Chronic bradycardia: Stable.  No need to continue cardiac telemetry.   DVT prophylaxis: SCDs.   Code Status: DNR. Family Communication: Patient's daughter [name] at the bedside. Disposition Plan: patient is from home. Anticipated DC to inpatient rehab versus skilled nursing rehab, Barriers to discharge on active treatment.  Patient is in too much pain to stand up and walk and  unsafe to discharge home.  Anticipating kyphoplasty inpatient.  Consultants:   Interventional radiology  Procedures:   None  Antimicrobials:   None   Subjective: Patient seen and examined.  Daughter at bedside.  Some confusion overnight, however daughter stayed with her. No bowel movement yet. She would not agree to ambulate, already having pain after using oxycodone.  She wants to try some stronger pain medications.  Objective: Vitals:   12/25/19 1532 12/25/19 1957 12/26/19 0329 12/26/19 0859  BP: (!) 118/47 (!) 120/57 (!) 124/41 (!) 116/45  Pulse: 69 (!) 40 (!) 40 (!) 41  Resp: 18 16 14 15   Temp: 98.3 F (36.8 C) 97.7 F (36.5 C) 97.8 F (36.6 C) 97.6 F (36.4 C)  TempSrc: Oral Oral Oral Oral  SpO2: 100% 99% 99% 97%  Weight:      Height:        Intake/Output Summary (Last 24 hours) at 12/26/2019 1114 Last data filed at 12/25/2019 2300 Gross per 24 hour  Intake 480 ml  Output 1000 ml  Net -520 ml   Filed Weights   12/23/19 1327  Weight: 62.5 kg    Examination:  Physical Exam  Constitutional:  Alert and awake.  Mostly oriented, short-term memory loss.  In moderate pain at rest.  HENT:  Head: Normocephalic and atraumatic.  Eyes: Pupils are equal, round, and reactive to light.  Cardiovascular: Normal rate and regular rhythm.  Pulmonary/Chest: Breath sounds normal.  Abdominal: Bowel sounds are normal.  Musculoskeletal:        General: No tenderness, deformity or edema.     Cervical back: Neck supple.     Comments: No localized tenderness  or deformity on physical exam.     Data Reviewed: I have personally reviewed following labs and imaging studies  CBC: Recent Labs  Lab 12/23/19 1329 12/24/19 0431  WBC 8.1 10.2  NEUTROABS 5.3  --   HGB 15.7* 14.2  HCT 47.7* 42.1  MCV 91.2 89.2  PLT 271 856   Basic Metabolic Panel: Recent Labs  Lab 12/23/19 1329 12/24/19 0431  NA 138 141  K 4.2 4.1  CL 107 110  CO2 24 23  GLUCOSE 95 109*  BUN 23 18    CREATININE 0.61 0.59  CALCIUM 9.3 8.8*   GFR: Estimated Creatinine Clearance: 52.7 mL/min (by C-G formula based on SCr of 0.59 mg/dL). Liver Function Tests: Recent Labs  Lab 12/23/19 1329  AST 22  ALT 11  ALKPHOS 88  BILITOT 0.6  PROT 7.4  ALBUMIN 3.6   No results for input(s): LIPASE, AMYLASE in the last 168 hours. No results for input(s): AMMONIA in the last 168 hours. Coagulation Profile: Recent Labs  Lab 12/26/19 0814  INR 1.0   Cardiac Enzymes: No results for input(s): CKTOTAL, CKMB, CKMBINDEX, TROPONINI in the last 168 hours. BNP (last 3 results) No results for input(s): PROBNP in the last 8760 hours. HbA1C: No results for input(s): HGBA1C in the last 72 hours. CBG: No results for input(s): GLUCAP in the last 168 hours. Lipid Profile: No results for input(s): CHOL, HDL, LDLCALC, TRIG, CHOLHDL, LDLDIRECT in the last 72 hours. Thyroid Function Tests: No results for input(s): TSH, T4TOTAL, FREET4, T3FREE, THYROIDAB in the last 72 hours. Anemia Panel: No results for input(s): VITAMINB12, FOLATE, FERRITIN, TIBC, IRON, RETICCTPCT in the last 72 hours. Sepsis Labs: No results for input(s): PROCALCITON, LATICACIDVEN in the last 168 hours.  Recent Results (from the past 240 hour(s))  SARS CORONAVIRUS 2 (TAT 6-24 HRS) Nasopharyngeal Nasopharyngeal Swab     Status: None   Collection Time: 12/23/19  5:44 PM   Specimen: Nasopharyngeal Swab  Result Value Ref Range Status   SARS Coronavirus 2 NEGATIVE NEGATIVE Final    Comment: (NOTE) SARS-CoV-2 target nucleic acids are NOT DETECTED. The SARS-CoV-2 RNA is generally detectable in upper and lower respiratory specimens during the acute phase of infection. Negative results do not preclude SARS-CoV-2 infection, do not rule out co-infections with other pathogens, and should not be used as the sole basis for treatment or other patient management decisions. Negative results must be combined with clinical observations, patient  history, and epidemiological information. The expected result is Negative. Fact Sheet for Patients: SugarRoll.be Fact Sheet for Healthcare Providers: https://www.woods-mathews.com/ This test is not yet approved or cleared by the [place] FDA and  has been authorized for detection and/or diagnosis of SARS-CoV-2 by FDA under an Emergency Use Authorization (EUA). This EUA will remain  in effect (meaning this test can be used) for the duration of the COVID-19 declaration under Section 56 4(b)(1) of the Act, 21 U.S.C. section 360bbb-3(b)(1), unless the authorization is terminated or revoked sooner. Performed at [facility], [place] [address], [place], [place] [address]          Radiology Studies: MR LUMBAR SPINE WO CONTRAST  Result Date: 12/25/2019 CLINICAL DATA:  Lumbar compression fracture EXAM: MRI LUMBAR SPINE WITHOUT CONTRAST TECHNIQUE: Multiplanar, multisequence MR imaging of the lumbar spine was performed. No intravenous contrast was administered. COMPARISON:  CT 12/23/2019 FINDINGS: Segmentation:  Normal Alignment:  Normal Vertebrae: Mild superior endplate compression fracture of T12 with bone marrow edema. No change from the prior  CT. Moderate compression fracture of L1 with approximately 50% loss of vertebral body height and diffuse bone marrow edema. Slight retropulsion of the superior endplate of L1 into the canal without significant stenosis. No change from recent CT. No other fracture or evidence of metastatic disease in the lumbar spine Conus medullaris and cauda equina: Conus extends to the L1-2 level. Conus and cauda equina appear normal. Paraspinal and other soft tissues: Negative Disc levels: T12-L1: Mild retropulsion of L1 into the spinal canal without significant spinal stenosis. L1-2: Mild disc degeneration without stenosis L2-3: Negative L3-4: Mild disc degeneration. Negative for disc protrusion or stenosis L4-5: Mild to  moderate disc degeneration with disc space narrowing. Small central disc protrusion. Negative for stenosis L5-S1: Disc degeneration with disc space narrowing. Mild endplate spurring on the left without significant foraminal stenosis IMPRESSION: Mild compression fracture T12 appears acute Moderate compression fracture L1 appears acute. Mild retropulsion of the superior endplate of L1 into the canal without significant stenosis Mild lumbar degenerative change without spinal stenosis. Electronically Signed   By: Marlan Palau M.D.   On: 12/25/2019 14:21        Scheduled Meds: . aspirin  162 mg Oral Daily  . levothyroxine  88 mcg Oral QAC breakfast  . lidocaine  1 patch Transdermal Q24H  . polyethylene glycol  17 g Oral Daily   Continuous Infusions:   LOS: 3 days    Time spent: 25 minutes    Dorcas Carrow, MD [facility] Pager [phone]

## 2019-12-27 NOTE — Plan of Care (Signed)
  Problem: Education: Goal: Knowledge of General Education information will improve Description: Including pain rating scale, medication(s)/side effects and non-pharmacologic comfort measures Outcome: Progressing   Problem: Health Behavior/Discharge Planning: Goal: Ability to manage health-related needs will improve Outcome: Progressing   Problem: Clinical Measurements: Goal: Ability to maintain clinical measurements within normal limits will improve Outcome: Progressing   Problem: Activity: Goal: Risk for activity intolerance will decrease Outcome: Progressing   Problem: Coping: Goal: Level of anxiety will decrease Outcome: Progressing   Problem: Elimination: Goal: Will not experience complications related to bowel motility Outcome: Progressing Goal: Will not experience complications related to urinary retention Outcome: Progressing   Problem: Pain Managment: Goal: General experience of comfort will improve Outcome: Progressing   Problem: Safety: Goal: Ability to remain free from injury will improve Outcome: Progressing

## 2019-12-27 NOTE — Progress Notes (Signed)
PROGRESS NOTE    Amy Weiss  EUM:[id number] DOB: [birth year] DOA: 12/23/2019 PCP: [name], MD    Brief Narrative:  85 year old female with history of hypothyroidism and memory issues, SVT status post ablation with chronic sinus bradycardia, ongoing back pain issues with multiple interventions for last 1 month presented to ER with worsening pain and unable to ambulate.  Patient has some forgetfulness.  She lives alone.  Last week or so she has been more bedbound due to cold and had worsening pain. [facility].  Lumbar spine CT scan showed acute L1 compression fracture and age-indeterminate T12 compression fracture.  Neurosurgery was consulted who requested T SLO brace and mobility.  Patient could not walk hence admitted to the hospital.   Assessment & Plan:   Principal Problem:   Lumbar compression fracture (HCC) Active Problems:   Hypothyroidism   Bradycardia   Lumbar compression fracture, closed, initial encounter (HCC)  Severe intractable back pain with multilevel lumbar compression fracture: Acute traumatic compression fracture of L1 and T12 vertebra.  Continues to have pain, she is not able to ambulate without use of pain medications.   She will use pain medications, she prefers only using oral pain medications and continue to work with PT OT.  On oxycodone, add muscle relaxants and local lidocaine patches. Continue to work with PT OT.  All-time fall precautions.  Bowel regimen. Seen by interventional radiology, for kyphoplasty, anticipate on 2/8. She will need skilled nursing facility placement after the procedure.  Hypothyroidism: On Synthroid.  Stable.  Chronic bradycardia: Stable.  No need to continue cardiac telemetry.   DVT prophylaxis: SCDs.   Code Status: DNR. Family Communication: Patient's daughter [name] at the bedside 2/5. Disposition Plan: patient is from home. Anticipated DC to inpatient rehab versus skilled nursing rehab, Barriers to  discharge on active treatment.  Patient is in too much pain to stand up and walk and unsafe to discharge home.  Anticipating kyphoplasty while inpatient and skilled nursing facility placement.   Consultants:   Interventional radiology  Procedures:   None  Antimicrobials:   None   Subjective: Patient seen and examined.  No overnight events.  Complains of back pain and wanting something for the pain.  She was asking when she will be able to go home, how soon after the procedure.  No other overnight events.  No bowel movement yet.  Does not have any nausea or vomiting.  Objective: Vitals:   12/26/19 1345 12/26/19 2011 12/26/19 2053 12/27/19 0349  BP: (!) 149/44  (!) 140/45 (!) 145/46  Pulse: (!) 42 (!) 42  (!) 40  Resp: 18 16  16   Temp: 97.6 F (36.4 C) 98 F (36.7 C)  97.9 F (36.6 C)  TempSrc: Oral Oral  Oral  SpO2: 99% 99%  96%  Weight:      Height:        Intake/Output Summary (Last 24 hours) at 12/27/2019 1316 Last data filed at 12/27/2019 0847 Gross per 24 hour  Intake 120 ml  Output 1500 ml  Net -1380 ml   Filed Weights   12/23/19 1327  Weight: 62.5 kg    Examination:  Physical Exam  Constitutional:  Alert and awake.  Mostly oriented, short-term memory loss.  In moderate pain at rest.  HENT:  Head: Normocephalic and atraumatic.  Eyes: Pupils are equal, round, and reactive to light.  Cardiovascular: Normal rate and regular rhythm.  Pulmonary/Chest: Breath sounds normal.  Abdominal: Bowel sounds are  normal.  Musculoskeletal:        General: No tenderness, deformity or edema.     Cervical back: Neck supple.     Comments: No localized tenderness or deformity on physical exam.     Data Reviewed: I have personally reviewed following labs and imaging studies  CBC: Recent Labs  Lab 12/23/19 1329 12/24/19 0431  WBC 8.1 10.2  NEUTROABS 5.3  --   HGB 15.7* 14.2  HCT 47.7* 42.1  MCV 91.2 89.2  PLT 271 253   Basic Metabolic Panel: Recent Labs  Lab  12/23/19 1329 12/24/19 0431  NA 138 141  K 4.2 4.1  CL 107 110  CO2 24 23  GLUCOSE 95 109*  BUN 23 18  CREATININE 0.61 0.59  CALCIUM 9.3 8.8*   GFR: Estimated Creatinine Clearance: 52.7 mL/min (by C-G formula based on SCr of 0.59 mg/dL). Liver Function Tests: Recent Labs  Lab 12/23/19 1329  AST 22  ALT 11  ALKPHOS 88  BILITOT 0.6  PROT 7.4  ALBUMIN 3.6   No results for input(s): LIPASE, AMYLASE in the last 168 hours. No results for input(s): AMMONIA in the last 168 hours. Coagulation Profile: Recent Labs  Lab 12/26/19 0814  INR 1.0   Cardiac Enzymes: No results for input(s): CKTOTAL, CKMB, CKMBINDEX, TROPONINI in the last 168 hours. BNP (last 3 results) No results for input(s): PROBNP in the last 8760 hours. HbA1C: No results for input(s): HGBA1C in the last 72 hours. CBG: No results for input(s): GLUCAP in the last 168 hours. Lipid Profile: No results for input(s): CHOL, HDL, LDLCALC, TRIG, CHOLHDL, LDLDIRECT in the last 72 hours. Thyroid Function Tests: No results for input(s): TSH, T4TOTAL, FREET4, T3FREE, THYROIDAB in the last 72 hours. Anemia Panel: No results for input(s): VITAMINB12, FOLATE, FERRITIN, TIBC, IRON, RETICCTPCT in the last 72 hours. Sepsis Labs: No results for input(s): PROCALCITON, LATICACIDVEN in the last 168 hours.  Recent Results (from the past 240 hour(s))  SARS CORONAVIRUS 2 (TAT 6-24 HRS) Nasopharyngeal Nasopharyngeal Swab     Status: None   Collection Time: 12/23/19  5:44 PM   Specimen: Nasopharyngeal Swab  Result Value Ref Range Status   SARS Coronavirus 2 NEGATIVE NEGATIVE Final    Comment: (NOTE) SARS-CoV-2 target nucleic acids are NOT DETECTED. The SARS-CoV-2 RNA is generally detectable in upper and lower respiratory specimens during the acute phase of infection. Negative results do not preclude SARS-CoV-2 infection, do not rule out co-infections with other pathogens, and should not be used as the sole basis for treatment  or other patient management decisions. Negative results must be combined with clinical observations, patient history, and epidemiological information. The expected result is Negative. Fact Sheet for Patients: HairSlick.no Fact Sheet for Healthcare Providers: quierodirigir.com This test is not yet approved or cleared by the [place] FDA and  has been authorized for detection and/or diagnosis of SARS-CoV-2 by FDA under an Emergency Use Authorization (EUA). This EUA will remain  in effect (meaning this test can be used) for the duration of the COVID-19 declaration under Section 56 4(b)(1) of the Act, 21 U.S.C. section 360bbb-3(b)(1), unless the authorization is terminated or revoked sooner. Performed at [facility], [address], [place], [place] [address]   Urine Culture     Status: None (Preliminary result)   Collection Time: 12/26/19  9:31 AM   Specimen: Urine, Clean Catch  Result Value Ref Range Status   Specimen Description URINE, CLEAN CATCH  Final   Special Requests  NONE  Final   Culture   Final    CULTURE REINCUBATED FOR BETTER GROWTH Performed at [facility], [place] [address], [place], [place] [address]    Report Status PENDING  Incomplete         Radiology Studies: MR LUMBAR SPINE WO CONTRAST  Result Date: 12/25/2019 CLINICAL DATA:  Lumbar compression fracture EXAM: MRI LUMBAR SPINE WITHOUT CONTRAST TECHNIQUE: Multiplanar, multisequence MR imaging of the lumbar spine was performed. No intravenous contrast was administered. COMPARISON:  CT 12/23/2019 FINDINGS: Segmentation:  Normal Alignment:  Normal Vertebrae: Mild superior endplate compression fracture of T12 with bone marrow edema. No change from the prior CT. Moderate compression fracture of L1 with approximately 50% loss of vertebral body height and diffuse bone marrow edema. Slight retropulsion of the superior endplate of L1 into the canal without  significant stenosis. No change from recent CT. No other fracture or evidence of metastatic disease in the lumbar spine Conus medullaris and cauda equina: Conus extends to the L1-2 level. Conus and cauda equina appear normal. Paraspinal and other soft tissues: Negative Disc levels: T12-L1: Mild retropulsion of L1 into the spinal canal without significant spinal stenosis. L1-2: Mild disc degeneration without stenosis L2-3: Negative L3-4: Mild disc degeneration. Negative for disc protrusion or stenosis L4-5: Mild to moderate disc degeneration with disc space narrowing. Small central disc protrusion. Negative for stenosis L5-S1: Disc degeneration with disc space narrowing. Mild endplate spurring on the left without significant foraminal stenosis IMPRESSION: Mild compression fracture T12 appears acute Moderate compression fracture L1 appears acute. Mild retropulsion of the superior endplate of L1 into the canal without significant stenosis Mild lumbar degenerative change without spinal stenosis. Electronically Signed   By: Franchot Gallo M.D.   On: 12/25/2019 14:21        Scheduled Meds: . aspirin  162 mg Oral Daily  . levothyroxine  88 mcg Oral QAC breakfast  . lidocaine  1 patch Transdermal Q24H  . polyethylene glycol  17 g Oral Daily   Continuous Infusions:   LOS: 4 days    Time spent: 25 minutes    Barb Merino, MD [facility] Pager [phone]

## 2019-12-28 LAB — URINE CULTURE: Culture: 40000 — AB

## 2019-12-28 NOTE — Progress Notes (Signed)
declineing cardiac monitoring

## 2019-12-28 NOTE — Progress Notes (Signed)
PROGRESS NOTE    Amy Weiss  DGU:[id number] DOB: [birth year] DOA: 12/23/2019 PCP: Colon Branch, MD    Brief Narrative:  85 year old female with history of hypothyroidism and memory issues, SVT status post ablation with chronic sinus bradycardia, ongoing back pain issues with multiple interventions for last 1 month presented to ER with worsening pain and unable to ambulate.  Patient has some forgetfulness.  She lives alone.  Last week or so she has been more bedbound due to cold and had worsening pain. Presented to [facility] ER.  Lumbar spine CT scan showed acute L1 compression fracture and age-indeterminate T12 compression fracture.  Neurosurgery was consulted who requested T SLO brace and mobility.  Patient could not walk hence admitted to the hospital.   Assessment & Plan:   Principal Problem:   Lumbar compression fracture (HCC) Active Problems:   Hypothyroidism   Bradycardia   Lumbar compression fracture, closed, initial encounter ([place])  intractable back pain with multilevel lumbar compression fracture: Acute traumatic compression fracture of L1 and T12 vertebra.  Continues to have pain, she is not able to ambulate without use of pain medications.   She will use pain medications, she prefers only using oral pain medications and continue to work with PT OT.  On oxycodone, add muscle relaxants and local lidocaine patches. Continue to work with PT OT.  All-time fall precautions.  Bowel regimen. Seen by interventional radiology, for kyphoplasty, anticipate on 2/8. She will need skilled nursing facility placement after the procedure.  Hypothyroidism: On Synthroid.  Stable.  Chronic bradycardia: Stable.  No need to continue cardiac telemetry.  Abnormal urinalysis: Asymptomatic.  Pleasant 40,000 colonies of coagulase-negative Staphylococcus, no need to treat.   DVT prophylaxis: SCDs.   Code Status: DNR. Family Communication: Patient's daughter [name] at bedside. Disposition  Plan: patient is from home. Anticipated DC to inpatient rehab versus skilled nursing rehab, Barriers to discharge on active treatment.  Patient is in too much pain to stand up and walk and unsafe to discharge home.  Anticipating kyphoplasty while inpatient and skilled nursing facility placement.   Consultants:   Interventional radiology  Procedures:   None  Antimicrobials:   None   Subjective: Patient seen and examined.  No overnight events.  Daughter at the bedside. Patient states minimal pain on her back.  No bowel movement yet.  Denies any abdomen pain or nausea.  Objective: Vitals:   12/26/19 2053 12/27/19 0349 12/28/19 0346 12/28/19 0903  BP: (!) 140/45 (!) 145/46 (!) 145/55 (!) 125/55  Pulse:  (!) 40 (!) 40 (!) 41  Resp:  16  16  Temp:  97.9 F (36.6 C)  98.3 F (36.8 C)  TempSrc:  Oral  Oral  SpO2:  96% 99% 98%  Weight:      Height:        Intake/Output Summary (Last 24 hours) at 12/28/2019 1022 Last data filed at 12/27/2019 1743 Gross per 24 hour  Intake 240 ml  Output --  Net 240 ml   Filed Weights   12/23/19 1327  Weight: 62.5 kg    Examination:  Physical Exam  Constitutional:  Alert and awake.  Mostly oriented, short-term memory loss.  Complains of mild low back pain.  HENT:  Head: Normocephalic and atraumatic.  Eyes: Pupils are equal, round, and reactive to light.  Cardiovascular: Normal rate and regular rhythm.  Pulmonary/Chest: Breath sounds normal.  Abdominal: Bowel sounds are normal.  Musculoskeletal:        General: No  tenderness, deformity or edema.     Cervical back: Neck supple.     Comments: No localized tenderness or deformity on physical exam.     Data Reviewed: I have personally reviewed following labs and imaging studies  CBC: Recent Labs  Lab 12/23/19 1329 12/24/19 0431  WBC 8.1 10.2  NEUTROABS 5.3  --   HGB 15.7* 14.2  HCT 47.7* 42.1  MCV 91.2 89.2  PLT 271 253   Basic Metabolic Panel: Recent Labs  Lab  12/23/19 1329 12/24/19 0431  NA 138 141  K 4.2 4.1  CL 107 110  CO2 24 23  GLUCOSE 95 109*  BUN 23 18  CREATININE 0.61 0.59  CALCIUM 9.3 8.8*   GFR: Estimated Creatinine Clearance: 52.7 mL/min (by C-G formula based on SCr of 0.59 mg/dL). Liver Function Tests: Recent Labs  Lab 12/23/19 1329  AST 22  ALT 11  ALKPHOS 88  BILITOT 0.6  PROT 7.4  ALBUMIN 3.6   No results for input(s): LIPASE, AMYLASE in the last 168 hours. No results for input(s): AMMONIA in the last 168 hours. Coagulation Profile: Recent Labs  Lab 12/26/19 0814  INR 1.0   Cardiac Enzymes: No results for input(s): CKTOTAL, CKMB, CKMBINDEX, TROPONINI in the last 168 hours. BNP (last 3 results) No results for input(s): PROBNP in the last 8760 hours. HbA1C: No results for input(s): HGBA1C in the last 72 hours. CBG: No results for input(s): GLUCAP in the last 168 hours. Lipid Profile: No results for input(s): CHOL, HDL, LDLCALC, TRIG, CHOLHDL, LDLDIRECT in the last 72 hours. Thyroid Function Tests: No results for input(s): TSH, T4TOTAL, FREET4, T3FREE, THYROIDAB in the last 72 hours. Anemia Panel: No results for input(s): VITAMINB12, FOLATE, FERRITIN, TIBC, IRON, RETICCTPCT in the last 72 hours. Sepsis Labs: No results for input(s): PROCALCITON, LATICACIDVEN in the last 168 hours.  Recent Results (from the past 240 hour(s))  SARS CORONAVIRUS 2 (TAT 6-24 HRS) Nasopharyngeal Nasopharyngeal Swab     Status: None   Collection Time: 12/23/19  5:44 PM   Specimen: Nasopharyngeal Swab  Result Value Ref Range Status   SARS Coronavirus 2 NEGATIVE NEGATIVE Final    Comment: (NOTE) SARS-CoV-2 target nucleic acids are NOT DETECTED. The SARS-CoV-2 RNA is generally detectable in upper and lower respiratory specimens during the acute phase of infection. Negative results do not preclude SARS-CoV-2 infection, do not rule out co-infections with other pathogens, and should not be used as the sole basis for treatment  or other patient management decisions. Negative results must be combined with clinical observations, patient history, and epidemiological information. The expected result is Negative. Fact Sheet for Patients: HairSlick.no Fact Sheet for Healthcare Providers: quierodirigir.com This test is not yet approved or cleared by the [place] FDA and  has been authorized for detection and/or diagnosis of SARS-CoV-2 by FDA under an Emergency Use Authorization (EUA). This EUA will remain  in effect (meaning this test can be used) for the duration of the COVID-19 declaration under Section 56 4(b)(1) of the Act, 21 U.S.C. section 360bbb-3(b)(1), unless the authorization is terminated or revoked sooner. Performed at [facility], [address], [place], [place] [address]   Urine Culture     Status: Abnormal   Collection Time: 12/26/19  9:31 AM   Specimen: Urine, Clean Catch  Result Value Ref Range Status   Specimen Description URINE, CLEAN CATCH  Final   Special Requests   Final    NONE Performed at [facility], [address] [place], [place] [address]    Culture 40,000 COLONIES/mL STAPHYLOCOCCUS LUGDUNENSIS (A)  Final   Report Status 12/28/2019 FINAL  Final   Organism ID, Bacteria STAPHYLOCOCCUS LUGDUNENSIS (A)  Final      Susceptibility   Staphylococcus lugdunensis - MIC*    CIPROFLOXACIN <=0.5 SENSITIVE Sensitive     GENTAMICIN <=0.5 SENSITIVE Sensitive     NITROFURANTOIN <=16 SENSITIVE Sensitive     OXACILLIN 2 SENSITIVE Sensitive     TETRACYCLINE <=1 SENSITIVE Sensitive     VANCOMYCIN <=0.5 SENSITIVE Sensitive     TRIMETH/SULFA <=10 SENSITIVE Sensitive     CLINDAMYCIN <=0.25 SENSITIVE Sensitive     RIFAMPIN <=0.5 SENSITIVE Sensitive     Inducible Clindamycin NEGATIVE Sensitive     * 40,000 COLONIES/mL STAPHYLOCOCCUS LUGDUNENSIS         Radiology Studies: No results found.      Scheduled Meds: .  aspirin  162 mg Oral Daily  . levothyroxine  88 mcg Oral QAC breakfast  . lidocaine  1 patch Transdermal Q24H  . polyethylene glycol  17 g Oral Daily   Continuous Infusions:   LOS: 5 days    Time spent: 25 minutes    Dorcas Carrow, MD [facility] Pager [phone]

## 2019-12-28 NOTE — Progress Notes (Signed)
Occupational Therapy Treatment Patient Details Name: Amy Weiss MRN: [id number] DOB: [birth year] Today's Date: 12/28/2019    History of present illness Pt is an 85 y/o female admitted secondary to back pain and found to have an L1 compression fx. Neurosurgery was consulted and recommended a TLSO. PMH including but not limited to DM, HTN and TIA.   OT comments  Pt making excellent progress toward OT goals this date. Pt able to complete bed mobility at min A and sit <> stand with min A with RW. Pt completed functional mobility to bathroom to stand for bathing. Dtr assisting at min A level for LB and back. Pt able to complete standing grooming tasks, then compete functional mobility in the hallway ~250 ft at min guard level. She shows appropriate cognitive deficits considering dementia diagnosis, but is pleasant and agreeable. Recommend CIR post kyphoplasty considering her status post procedure. Will continue to follow.   Follow Up Recommendations  CIR;Other (comment)(pending kyphoplasty progress)    Equipment Recommendations  3 in 1 bedside commode;Tub/shower bench    Recommendations for Other Services      Precautions / Restrictions Precautions Precautions: Fall;Back Required Braces or Orthoses: Spinal Brace Spinal Brace: Thoracolumbosacral orthotic(not present in room) Restrictions Weight Bearing Restrictions: No       Mobility Bed Mobility Overal bed mobility: Needs Assistance Bed Mobility: Sidelying to Sit;Sit to Sidelying   Sidelying to sit: Min assist     Sit to sidelying: Min assist General bed mobility comments: cueing for log roll technique, use of bed rails, min A for trunk elevation and for bilateral LEs to return to bed  Transfers Overall transfer level: Needs assistance Equipment used: Rolling walker (2 wheeled) Transfers: Sit to/from Stand Sit to Stand: Min assist         General transfer comment: min A to rise and steady for safety    Balance  Overall balance assessment: Needs assistance Sitting-balance support: Feet supported Sitting balance-Leahy Scale: Fair     Standing balance support: Bilateral upper extremity supported;Single extremity supported Standing balance-Leahy Scale: Poor Standing balance comment: reliant on external support                           ADL either performed or assessed with clinical judgement   ADL Overall ADL's : Needs assistance/impaired         Upper Body Bathing: Set up;Standing   Lower Body Bathing: Minimal assistance;Sit to/from stand Lower Body Bathing Details (indicate cue type and reason): dtr asssiting while pt standing at sink with UE support Upper Body Dressing : Set up;Sitting   Lower Body Dressing: Minimal assistance;Sit to/from stand   Toilet Transfer: Min guard;Regular Toilet;Grab bars;RW   Toileting- Water quality scientist and Hygiene: Set up;Sit to/from stand       Functional mobility during ADLs: Min guard;Rolling walker General ADL Comments: pt progressing to standing at sink for bathing and functionalmobility in hallway with OT     Vision Baseline Vision/History: Wears glasses Wears Glasses: Reading only Patient Visual Report: No change from baseline     Perception     Praxis      Cognition Arousal/Alertness: Awake/alert Behavior During Therapy: WFL for tasks assessed/performed Overall Cognitive Status: Impaired/Different from baseline Area of Impairment: Memory;Safety/judgement;Problem solving                     Memory: Decreased short-term memory;Decreased recall of precautions   Safety/Judgement: Decreased awareness of deficits;Decreased awareness of  safety   Problem Solving: Difficulty sequencing;Requires verbal cues General Comments: hx of dementia at baseline, with appropriate deficits. Pt can be easily frustrated but also easily directable to situation/task        Exercises     Shoulder Instructions       General  Comments      Pertinent Vitals/ Pain       Pain Assessment: Faces Faces Pain Scale: Hurts little more Pain Location: back Pain Descriptors / Indicators: Grimacing;Guarding Pain Intervention(s): Monitored during session;Limited activity within patient's tolerance;Repositioned  Home Living                                          Prior Functioning/Environment              Frequency  Min 2X/week        Progress Toward Goals  OT Goals(current goals can now be found in the care plan section)  Progress towards OT goals: Progressing toward goals  Acute Rehab OT Goals Patient Stated Goal: decrease pain OT Goal Formulation: With patient Time For Goal Achievement: 01/08/20 Potential to Achieve Goals: Good  Plan Discharge plan needs to be updated    Co-evaluation                 AM-PAC OT "6 Clicks" Daily Activity     Outcome Measure   Help from another person eating meals?: None Help from another person taking care of personal grooming?: A Little Help from another person toileting, which includes using toliet, bedpan, or urinal?: A Lot Help from another person bathing (including washing, rinsing, drying)?: A Lot Help from another person to put on and taking off regular upper body clothing?: A Little Help from another person to put on and taking off regular lower body clothing?: A Lot 6 Click Score: 16    End of Session Equipment Utilized During Treatment: Gait belt;Rolling walker  OT Visit Diagnosis: Muscle weakness (generalized) (M62.81);Other symptoms and signs involving cognitive function;Pain Pain - part of body: (spine)   Activity Tolerance Patient tolerated treatment well   Patient Left in bed;with call bell/phone within reach;with bed alarm set;with family/visitor present   Nurse Communication Mobility status;Precautions        Time: 0939-1000 OT Time Calculation (min): 21 min  Charges: OT General Charges $OT Visit: 1  Visit OT Treatments $Self Care/Home Management : 8-22 mins  Dalphine Handing, MSOT, OTR/L Acute Rehabilitation Services [facility] Office Number: [phone]  Dalphine Handing 12/28/2019, 2:57 PM

## 2019-12-29 ENCOUNTER — Inpatient Hospital Stay (HOSPITAL_COMMUNITY): Payer: Medicare HMO

## 2019-12-29 HISTORY — PX: IR KYPHO EA ADDL LEVEL THORACIC OR LUMBAR: IMG5520

## 2019-12-29 HISTORY — PX: IR KYPHO THORACIC WITH BONE BIOPSY: IMG5518

## 2019-12-29 LAB — CBC WITH DIFFERENTIAL/PLATELET
Abs Immature Granulocytes: 0.03 10*3/uL (ref 0.00–0.07)
Basophils Absolute: 0 10*3/uL (ref 0.0–0.1)
Basophils Relative: 1 %
Eosinophils Absolute: 0.2 10*3/uL (ref 0.0–0.5)
Eosinophils Relative: 3 %
HCT: 43.5 % (ref 36.0–46.0)
Hemoglobin: 14.4 g/dL (ref 12.0–15.0)
Immature Granulocytes: 1 %
Lymphocytes Relative: 23 %
Lymphs Abs: 1.5 10*3/uL (ref 0.7–4.0)
MCH: 30.4 pg (ref 26.0–34.0)
MCHC: 33.1 g/dL (ref 30.0–36.0)
MCV: 91.8 fL (ref 80.0–100.0)
Monocytes Absolute: 0.7 10*3/uL (ref 0.1–1.0)
Monocytes Relative: 11 %
Neutro Abs: 4 10*3/uL (ref 1.7–7.7)
Neutrophils Relative %: 61 %
Platelets: 290 10*3/uL (ref 150–400)
RBC: 4.74 MIL/uL (ref 3.87–5.11)
RDW: 15 % (ref 11.5–15.5)
WBC: 6.4 10*3/uL (ref 4.0–10.5)
nRBC: 0 % (ref 0.0–0.2)

## 2019-12-29 MED ORDER — HYDROMORPHONE HCL 1 MG/ML IJ SOLN
INTRAMUSCULAR | Status: AC
  Filled 2019-12-29: qty 1

## 2019-12-29 MED ORDER — FENTANYL CITRATE (PF) 100 MCG/2ML IJ SOLN
INTRAMUSCULAR | Status: AC
  Filled 2019-12-29: qty 2

## 2019-12-29 MED ORDER — CEFAZOLIN SODIUM-DEXTROSE 2-4 GM/100ML-% IV SOLN
INTRAVENOUS | Status: AC
  Administered 2019-12-29: 2 g via INTRAVENOUS
  Filled 2019-12-29: qty 100

## 2019-12-29 MED ORDER — FENTANYL CITRATE (PF) 100 MCG/2ML IJ SOLN
INTRAMUSCULAR | Status: AC | PRN
  Administered 2019-12-29 (×3): 25 ug via INTRAVENOUS

## 2019-12-29 MED ORDER — MIDAZOLAM HCL 2 MG/2ML IJ SOLN
INTRAMUSCULAR | Status: AC | PRN
  Administered 2019-12-29 (×2): 1 mg via INTRAVENOUS

## 2019-12-29 MED ORDER — CEFAZOLIN SODIUM-DEXTROSE 2-4 GM/100ML-% IV SOLN: 2.0000 g | Freq: Once | INTRAVENOUS | Status: AC

## 2019-12-29 MED ORDER — MIDAZOLAM HCL 2 MG/2ML IJ SOLN
INTRAMUSCULAR | Status: AC
  Filled 2019-12-29: qty 2

## 2019-12-29 MED ORDER — BUPIVACAINE HCL (PF) 0.5 % IJ SOLN
INTRAMUSCULAR | Status: AC
  Administered 2019-12-29: 30 mL
  Filled 2019-12-29: qty 30

## 2019-12-29 MED ORDER — IOHEXOL 300 MG/ML  SOLN
50.0000 mL | Freq: Once | INTRAMUSCULAR | Status: AC | PRN
  Administered 2019-12-29: 20 mL via INTRAVENOUS

## 2019-12-29 MED ORDER — BUPIVACAINE HCL (PF) 0.5 % IJ SOLN
INTRAMUSCULAR | Status: AC
  Filled 2019-12-29: qty 30

## 2019-12-29 MED ORDER — TOBRAMYCIN SULFATE 1.2 G IJ SOLR
INTRAMUSCULAR | Status: AC
  Filled 2019-12-29: qty 1.2

## 2019-12-29 NOTE — Plan of Care (Signed)
  Problem: Education: Goal: Knowledge of General Education information will improve Description: Including pain rating scale, medication(s)/side effects and non-pharmacologic comfort measures Outcome: Progressing   Problem: Clinical Measurements: Goal: Respiratory complications will improve Outcome: Progressing Note: On room air   Problem: Nutrition: Goal: Adequate nutrition will be maintained Outcome: Progressing   Problem: Coping: Goal: Level of anxiety will decrease Outcome: Progressing   Problem: Elimination: Goal: Will not experience complications related to urinary retention Outcome: Progressing   Problem: Pain Managment: Goal: General experience of comfort will improve Outcome: Progressing Note: Treated back pain 3 times twice with vicodin and once with flexeril   Problem: Safety: Goal: Ability to remain free from injury will improve Outcome: Progressing

## 2019-12-29 NOTE — Procedures (Signed)
S/P T 12 ,and L1 balloon kyphoplasty. S.Nuchem Grattan MD

## 2019-12-29 NOTE — Discharge Instructions (Signed)
You were seen in the emergency department for worsening back pain.  Your CAT scan showed a L1 compression fracture.  We consulted neurosurgery Dr. Johnsie Cancel and he recommended going into a brace to help support the area.  He wants you to schedule a follow-up with him this week.   Post Kyphoplasty instructions. 1.No stooping,or bending or lifting more than 10 Lbs for 2 weeks. 2.Use walker to ambulate foe 2 weeks. 3.No driving for  2 weeks. 4.RTC PRN 2 weeks.

## 2019-12-29 NOTE — Care Management (Signed)
Went to discuss discharge planning with patient and daughter at bedside. Daughter stated patient needs pain medication before she can discuss anything with anyone. Bedside nurse Joycelyn Schmid and Darl Pikes charge nurse aware.   Ronny Flurry RN

## 2019-12-29 NOTE — Progress Notes (Signed)
PROGRESS NOTE    Amy Weiss  DXI:[id number] DOB: 08/10/1935 DOA: 12/23/2019 PCP: [name], MD    Brief Narrative:  85 year old female with history of hypothyroidism and memory issues, SVT status post ablation with chronic sinus bradycardia, ongoing back pain issues with multiple interventions for last 1 month presented to ER with worsening pain and unable to ambulate.  Patient has some forgetfulness.  She lives alone.  Last week or so she has been more bedbound due to cold and had worsening pain. [facility].  Lumbar spine CT scan showed acute L1 compression fracture and age-indeterminate T12 compression fracture.  Neurosurgery was consulted who requested T SLO brace and mobility.  Patient could not walk hence admitted to the hospital.   Assessment & Plan:   Principal Problem:   Lumbar compression fracture (HCC) Active Problems:   Hypothyroidism   Bradycardia   Lumbar compression fracture, closed, initial encounter (HCC)  intractable back pain with multilevel lumbar compression fracture: Acute traumatic compression fracture of L1 and T12 vertebra.  Continues to have pain, she is not able to ambulate without use of pain medications.   On oxycodone, add muscle relaxants and local lidocaine patches. Continue to work with PT OT.  All-time fall precautions.  Bowel regimen. For IR guided kyphoplasty today.  Monitor post procedure tonight in the hospital. Refer to inpatient rehab to continue therapies.  Hypothyroidism: On Synthroid.  Stable.  Chronic bradycardia: Stable.  No need to continue cardiac telemetry.  Abnormal urinalysis: Asymptomatic.  Pleasant 40,000 colonies of coagulase-negative Staphylococcus, no need to treat.   DVT prophylaxis: SCDs.   Code Status: DNR. Family Communication: Patient's daughter [name] at bedside. Disposition Plan: patient is from home. Anticipated DC to inpatient rehab.  Barriers to discharge, going for kyphoplasty today,  anticipate discharge to rehab tomorrow if available.  Consultants:   Interventional radiology  Procedures:   None  Antimicrobials:   None   Subjective: No overnight events.  Daughter at bedside.  Wondering when she is going to kyphoplasty.  Objective: Vitals:   12/29/19 1230 12/29/19 1235 12/29/19 1241 12/29/19 1245  BP: (!) 142/61 (!) 126/93 (!) 178/83 (!) 179/72  Pulse: (!) 39 (!) 47 (!) 51 (!) 50  Resp: 15 17 (!) 21 (!) 21  Temp:      TempSrc:      SpO2: 100% 100% 100% 100%  Weight:      Height:        Intake/Output Summary (Last 24 hours) at 12/29/2019 1255 Last data filed at 12/29/2019 1225 Gross per 24 hour  Intake 580 ml  Output --  Net 580 ml   Filed Weights   12/23/19 1327 12/29/19 1221  Weight: 62.5 kg 62.5 kg    Examination:  Physical Exam  Constitutional:  Alert and awake.  Mostly oriented, short-term memory loss.  Complains of mild low back pain.  HENT:  Head: Normocephalic and atraumatic.  Eyes: Pupils are equal, round, and reactive to light.  Cardiovascular: Normal rate and regular rhythm.  Pulmonary/Chest: Breath sounds normal.  Abdominal: Bowel sounds are normal.  Musculoskeletal:        General: No tenderness, deformity or edema.     Cervical back: Neck supple.     Comments: No localized tenderness or deformity on physical exam.     Data Reviewed: I have personally reviewed following labs and imaging studies  CBC: Recent Labs  Lab 12/23/19 1329 12/24/19 0431 12/29/19 0941  WBC 8.1 10.2 6.4  NEUTROABS 5.3  --  4.0  HGB 15.7* 14.2 14.4  HCT 47.7* 42.1 43.5  MCV 91.2 89.2 91.8  PLT 271 253 161   Basic Metabolic Panel: Recent Labs  Lab 12/23/19 1329 12/24/19 0431  NA 138 141  K 4.2 4.1  CL 107 110  CO2 24 23  GLUCOSE 95 109*  BUN 23 18  CREATININE 0.61 0.59  CALCIUM 9.3 8.8*   GFR: Estimated Creatinine Clearance: 52.7 mL/min (by C-G formula based on SCr of 0.59 mg/dL). Liver Function Tests: Recent Labs  Lab  12/23/19 1329  AST 22  ALT 11  ALKPHOS 88  BILITOT 0.6  PROT 7.4  ALBUMIN 3.6   No results for input(s): LIPASE, AMYLASE in the last 168 hours. No results for input(s): AMMONIA in the last 168 hours. Coagulation Profile: Recent Labs  Lab 12/26/19 0814  INR 1.0   Cardiac Enzymes: No results for input(s): CKTOTAL, CKMB, CKMBINDEX, TROPONINI in the last 168 hours. BNP (last 3 results) No results for input(s): PROBNP in the last 8760 hours. HbA1C: No results for input(s): HGBA1C in the last 72 hours. CBG: No results for input(s): GLUCAP in the last 168 hours. Lipid Profile: No results for input(s): CHOL, HDL, LDLCALC, TRIG, CHOLHDL, LDLDIRECT in the last 72 hours. Thyroid Function Tests: No results for input(s): TSH, T4TOTAL, FREET4, T3FREE, THYROIDAB in the last 72 hours. Anemia Panel: No results for input(s): VITAMINB12, FOLATE, FERRITIN, TIBC, IRON, RETICCTPCT in the last 72 hours. Sepsis Labs: No results for input(s): PROCALCITON, LATICACIDVEN in the last 168 hours.  Recent Results (from the past 240 hour(s))  SARS CORONAVIRUS 2 (TAT 6-24 HRS) Nasopharyngeal Nasopharyngeal Swab     Status: None   Collection Time: 12/23/19  5:44 PM   Specimen: Nasopharyngeal Swab  Result Value Ref Range Status   SARS Coronavirus 2 NEGATIVE NEGATIVE Final    Comment: (NOTE) SARS-CoV-2 target nucleic acids are NOT DETECTED. The SARS-CoV-2 RNA is generally detectable in upper and lower respiratory specimens during the acute phase of infection. Negative results do not preclude SARS-CoV-2 infection, do not rule out co-infections with other pathogens, and should not be used as the sole basis for treatment or other patient management decisions. Negative results must be combined with clinical observations, patient history, and epidemiological information. The expected result is Negative. Fact Sheet for Patients: SugarRoll.be Fact Sheet for Healthcare  Providers: https://www.woods-mathews.com/ This test is not yet approved or cleared by the [place] FDA and  has been authorized for detection and/or diagnosis of SARS-CoV-2 by FDA under an Emergency Use Authorization (EUA). This EUA will remain  in effect (meaning this test can be used) for the duration of the COVID-19 declaration under Section 56 4(b)(1) of the Act, 21 U.S.C. section 360bbb-3(b)(1), unless the authorization is terminated or revoked sooner. Performed at [facility], [place] [address] [place], [place] [address]   Urine Culture     Status: Abnormal   Collection Time: 12/26/19  9:31 AM   Specimen: Urine, Clean Catch  Result Value Ref Range Status   Specimen Description URINE, CLEAN CATCH  Final   Special Requests   Final    NONE Performed at [facility], [place] [address], [place], [place] [address]    Culture 40,000 COLONIES/mL STAPHYLOCOCCUS LUGDUNENSIS (A)  Final   Report Status 12/28/2019 FINAL  Final   Organism ID, Bacteria STAPHYLOCOCCUS LUGDUNENSIS (A)  Final      Susceptibility   Staphylococcus lugdunensis - MIC*  CIPROFLOXACIN <=0.5 SENSITIVE Sensitive     GENTAMICIN <=0.5 SENSITIVE Sensitive     NITROFURANTOIN <=16 SENSITIVE Sensitive     OXACILLIN 2 SENSITIVE Sensitive     TETRACYCLINE <=1 SENSITIVE Sensitive     VANCOMYCIN <=0.5 SENSITIVE Sensitive     TRIMETH/SULFA <=10 SENSITIVE Sensitive     CLINDAMYCIN <=0.25 SENSITIVE Sensitive     RIFAMPIN <=0.5 SENSITIVE Sensitive     Inducible Clindamycin NEGATIVE Sensitive     * 40,000 COLONIES/mL STAPHYLOCOCCUS LUGDUNENSIS         Radiology Studies: No results found.      Scheduled Meds: . aspirin  162 mg Oral Daily  . bupivacaine      . fentaNYL      . HYDROmorphone      . levothyroxine  88 mcg Oral QAC breakfast  . lidocaine  1 patch Transdermal Q24H  . midazolam      . polyethylene glycol  17 g Oral Daily   Continuous Infusions:   LOS: 6 days    Time  spent: 25 minutes    Dorcas Carrow, MD [facility] Pager [phone]

## 2019-12-30 MED ORDER — HYDROCODONE-ACETAMINOPHEN 5-325 MG PO TABS: 1.0000 | ORAL_TABLET | Freq: Four times a day (QID) | ORAL | 0 refills | Status: DC | PRN

## 2019-12-30 MED ORDER — POLYETHYLENE GLYCOL 3350 17 G PO PACK
17.0000 g | PACK | Freq: Two times a day (BID) | ORAL | Status: DC
  Filled 2019-12-30 (×2): qty 1

## 2019-12-30 MED ORDER — SENNA 8.6 MG PO TABS
8.6000 mg | ORAL_TABLET | Freq: Every day | ORAL | Status: DC
  Administered 2019-12-30: 8.6 mg via ORAL
  Filled 2019-12-30 (×2): qty 1

## 2019-12-30 NOTE — Plan of Care (Signed)

## 2019-12-30 NOTE — Progress Notes (Signed)
PROGRESS NOTE    Amy Weiss  KGS:[id number] DOB: November 21, 1934 DOA: 12/23/2019 PCP: [name], MD    Brief Narrative:  85 year old female with history of hypothyroidism and memory issues, SVT status post ablation with chronic sinus bradycardia, ongoing back pain issues with multiple interventions for last 1 month presented to ER with worsening pain and unable to ambulate.  Patient has some forgetfulness.  She lives alone.  Last week or so she has been more bedbound due to cold and had worsening pain.  [facility]  Lumbar spine CT scan showed acute L1 compression fracture and age-indeterminate T12 compression fracture.  Neurosurgery was consulted who requested T SLO brace and mobility.  Patient could not walk hence admitted to the hospital.   Assessment & Plan:   Principal Problem:   Lumbar compression fracture (HCC) Active Problems:   Hypothyroidism   Bradycardia   Lumbar compression fracture, closed, initial encounter (HCC)  intractable back pain with multilevel lumbar compression fracture: Acute traumatic compression fracture of L1 and T12 vertebra.  She failed conservative management with pain medications and mobility.   Underwent kyphoplasty with IR T12 and L1 with good response.   Continue oxycodone and muscle relaxants and local lidocaine patches.   Continue to work with PT OT.   Referred to inpatient rehab to continue aggressive inpatient therapies.   Patient has good potential to go back to her baseline if she gets aggressive inpatient therapies.  Hypothyroidism: On Synthroid.  Stable.  Chronic bradycardia: Stable and chronic.  Abnormal urinalysis: Asymptomatic.40,000 colonies of coagulase-negative Staphylococcus, no need to treat.   DVT prophylaxis: SCDs.  We will start Lovenox today. Code Status: DNR. Family Communication: Patient's daughter [name] at bedside. Disposition Plan: patient is from home. Anticipated DC to inpatient rehab.  Barriers  to discharge Pending inpatient rehab availability.  Consultants:   Interventional radiology  Procedures:   None  Antimicrobials:   None   Subjective: Seen and examined.  Daughter at bedside. Patient herself denied any pain today. Early morning 5:00, patient got out of the bed and walk to the door to go to bathroom, she was reoriented and went to bathroom.  She was able to come back to bed with daughter's help with some pain on her back.  Objective: Vitals:   12/29/19 1305 12/29/19 1325 12/29/19 1929 12/30/19 0511  BP: (!) 147/49 (!) 124/46 137/65 (!) 143/53  Pulse: (!) 42 (!) 40 (!) 52 (!) 40  Resp:   16 20  Temp:  97.8 F (36.6 C) 98.4 F (36.9 C) 98.3 F (36.8 C)  TempSrc:  Oral Oral Oral  SpO2: 100% 99% 95% 96%  Weight:      Height:        Intake/Output Summary (Last 24 hours) at 12/30/2019 1011 Last data filed at 12/30/2019 0500 Gross per 24 hour  Intake 100 ml  Output 500 ml  Net -400 ml   Filed Weights   12/23/19 1327 12/29/19 1221  Weight: 62.5 kg 62.5 kg    Examination:  Physical Exam  Constitutional:  Alert awake and oriented.  She does have some memory issues and repeatation needed.  HENT:  Head: Normocephalic and atraumatic.  Eyes: Pupils are equal, round, and reactive to light.  Cardiovascular: Normal rate and regular rhythm.  Pulmonary/Chest: Breath sounds normal.  Abdominal: Bowel sounds are normal.  Musculoskeletal:        General: No tenderness, deformity or edema.     Cervical back: Neck supple.  Comments: No localized tenderness.  Needle site dry with some old dried blood.     Data Reviewed: I have personally reviewed following labs and imaging studies  CBC: Recent Labs  Lab 12/23/19 1329 12/24/19 0431 12/29/19 0941  WBC 8.1 10.2 6.4  NEUTROABS 5.3  --  4.0  HGB 15.7* 14.2 14.4  HCT 47.7* 42.1 43.5  MCV 91.2 89.2 91.8  PLT 271 253 290   Basic Metabolic Panel: Recent Labs  Lab 12/23/19 1329 12/24/19 0431  NA 138  141  K 4.2 4.1  CL 107 110  CO2 24 23  GLUCOSE 95 109*  BUN 23 18  CREATININE 0.61 0.59  CALCIUM 9.3 8.8*   GFR: Estimated Creatinine Clearance: 52.7 mL/min (by C-G formula based on SCr of 0.59 mg/dL). Liver Function Tests: Recent Labs  Lab 12/23/19 1329  AST 22  ALT 11  ALKPHOS 88  BILITOT 0.6  PROT 7.4  ALBUMIN 3.6   No results for input(s): LIPASE, AMYLASE in the last 168 hours. No results for input(s): AMMONIA in the last 168 hours. Coagulation Profile: Recent Labs  Lab 12/26/19 0814  INR 1.0   Cardiac Enzymes: No results for input(s): CKTOTAL, CKMB, CKMBINDEX, TROPONINI in the last 168 hours. BNP (last 3 results) No results for input(s): PROBNP in the last 8760 hours. HbA1C: No results for input(s): HGBA1C in the last 72 hours. CBG: No results for input(s): GLUCAP in the last 168 hours. Lipid Profile: No results for input(s): CHOL, HDL, LDLCALC, TRIG, CHOLHDL, LDLDIRECT in the last 72 hours. Thyroid Function Tests: No results for input(s): TSH, T4TOTAL, FREET4, T3FREE, THYROIDAB in the last 72 hours. Anemia Panel: No results for input(s): VITAMINB12, FOLATE, FERRITIN, TIBC, IRON, RETICCTPCT in the last 72 hours. Sepsis Labs: No results for input(s): PROCALCITON, LATICACIDVEN in the last 168 hours.  Recent Results (from the past 240 hour(s))  SARS CORONAVIRUS 2 (TAT 6-24 HRS) Nasopharyngeal Nasopharyngeal Swab     Status: None   Collection Time: 12/23/19  5:44 PM   Specimen: Nasopharyngeal Swab  Result Value Ref Range Status   SARS Coronavirus 2 NEGATIVE NEGATIVE Final    Comment: (NOTE) SARS-CoV-2 target nucleic acids are NOT DETECTED. The SARS-CoV-2 RNA is generally detectable in upper and lower respiratory specimens during the acute phase of infection. Negative results do not preclude SARS-CoV-2 infection, do not rule out co-infections with other pathogens, and should not be used as the sole basis for treatment or other patient management  decisions. Negative results must be combined with clinical observations, patient history, and epidemiological information. The expected result is Negative. Fact Sheet for Patients: HairSlick.no Fact Sheet for Healthcare Providers: quierodirigir.com This test is not yet approved or cleared by the [place] FDA and  has been authorized for detection and/or diagnosis of SARS-CoV-2 by FDA under an Emergency Use Authorization (EUA). This EUA will remain  in effect (meaning this test can be used) for the duration of the COVID-19 declaration under Section 56 4(b)(1) of the Act, 21 U.S.C. section 360bbb-3(b)(1), unless the authorization is terminated or revoked sooner. Performed at [facility], [address] [place], [place] [address]   Urine Culture     Status: Abnormal   Collection Time: 12/26/19  9:31 AM   Specimen: Urine, Clean Catch  Result Value Ref Range Status   Specimen Description URINE, CLEAN CATCH  Final   Special Requests   Final    NONE Performed at [facility], [address] [place],  [place] [address]    Culture 40,000 COLONIES/mL STAPHYLOCOCCUS LUGDUNENSIS (A)  Final   Report Status 12/28/2019 FINAL  Final   Organism ID, Bacteria STAPHYLOCOCCUS LUGDUNENSIS (A)  Final      Susceptibility   Staphylococcus lugdunensis - MIC*    CIPROFLOXACIN <=0.5 SENSITIVE Sensitive     GENTAMICIN <=0.5 SENSITIVE Sensitive     NITROFURANTOIN <=16 SENSITIVE Sensitive     OXACILLIN 2 SENSITIVE Sensitive     TETRACYCLINE <=1 SENSITIVE Sensitive     VANCOMYCIN <=0.5 SENSITIVE Sensitive     TRIMETH/SULFA <=10 SENSITIVE Sensitive     CLINDAMYCIN <=0.25 SENSITIVE Sensitive     RIFAMPIN <=0.5 SENSITIVE Sensitive     Inducible Clindamycin NEGATIVE Sensitive     * 40,000 COLONIES/mL STAPHYLOCOCCUS LUGDUNENSIS         Radiology Studies: No results found.      Scheduled Meds: . aspirin  162 mg Oral Daily  .  levothyroxine  88 mcg Oral QAC breakfast  . lidocaine  1 patch Transdermal Q24H  . polyethylene glycol  17 g Oral BID  . senna  8.6 mg Oral Daily   Continuous Infusions:   LOS: 7 days    Time spent: 25 minutes    Barb Merino, MD [facility] Pager [phone]

## 2019-12-30 NOTE — TOC Initial Note (Signed)
Transition of Care [facility]) - Initial/Assessment Note    Patient Details  Name: Amy Weiss MRN: [id number] Date of Birth: [birth year]  [facility] CM/SW Contact:    Kingsley Plan, RN Phone Number: 12/30/2019, 10:37 AM  Clinical Narrative:                  Spoke to patient and daughter [name] at bedside. PT to see patient again today. Await post procedure recommendations.   Patient does not want to go to a SNF. Daughter in agreement.   Patient and daughter aware home health would only visit approximately 2 to 3 times a week for 45 minutes at a time. Daughter plans to be present today during PT evaluation. Will follow up.   Patient has a Rollator at home ( daughter thinks regular rolling walker would be better) and a raised commode seat.  Patient from home with a caregiver 2 hours a day.          Patient Goals and CMS Choice Patient states their goals for this hospitalization and ongoing recovery are:: to go home CMS Medicare.gov Compare Post Acute Care list provided to:: Patient    Expected Discharge Plan and Services     Discharge Planning Services: CM Consult   Living arrangements for the past 2 months: Single Family Home                                      Prior Living Arrangements/Services Living arrangements for the past 2 months: Single Family Home Lives with:: Self          Need for Family Participation in Patient Care: Yes (Comment)     Criminal Activity/Legal Involvement Pertinent to Current Situation/Hospitalization: No - Comment as needed  Activities of Daily Living Home Assistive Devices/Equipment: Walker (specify type) ADL Screening (condition at time of admission) Patient's cognitive ability adequate to safely complete daily activities?: Yes Is the patient deaf or have difficulty hearing?: No Does the patient have difficulty seeing, even when wearing glasses/contacts?: No Does the patient have difficulty concentrating,  remembering, or making decisions?: Yes Patient able to express need for assistance with ADLs?: No Does the patient have difficulty dressing or bathing?: No Independently performs ADLs?: Yes (appropriate for developmental age) Does the patient have difficulty walking or climbing stairs?: Yes Weakness of Legs: None Weakness of Arms/Hands: None  Permission Sought/Granted   Permission granted to share information with : Yes, Verbal Permission Granted  Share Information with NAME: daughter [name]           Emotional Assessment Appearance:: Appears stated age Attitude/Demeanor/Rapport: Engaged Affect (typically observed): Accepting        Admission diagnosis:  Bradycardia [R00.1] Lumbar compression fracture (HCC) [S32.000A] Closed compression fracture of body of L1 vertebra (HCC) [S32.010A] Lumbar compression fracture, closed, initial encounter [facility]) [S32.000A] Patient Active Problem List   Diagnosis Date Noted  . Bradycardia 12/24/2019  . Lumbar compression fracture, closed, initial encounter (HCC) 12/24/2019  . Lumbar compression fracture (HCC) 12/23/2019  . Acute bilateral low back pain without sciatica 11/26/2019  . PCP NOTES >>>> 10/18/2015  . Atrioventricular block, Mobitz type 1, Wenckebach 12/10/2014  . Eczema 12/29/2011  . Annual physical exam 05/09/2011  . SKIN LESION 05/06/2010  . Hyperglycemia 11/19/2008  . TIA 11/19/2008  . Hyperlipidemia 03/04/2008  . FAMILIAL TREMOR 03/04/2008  . Arrhythmia--h/o AV reentry tachycardia 03/04/2008  . ARTHRALGIA 03/04/2008  .  OSTEOPOROSIS 03/04/2008  . Hypothyroidism 04/05/2007   PCP:  Colon Branch, MD Pharmacy:   [id number] - [place], [place] - [address] [place] [address] Phone: [phone] Fax: [phone]     Social Determinants of Health (SDOH) Interventions    Readmission Risk Interventions No flowsheet data  found.

## 2019-12-30 NOTE — Progress Notes (Addendum)
Physical Therapy Treatment Patient Details Name: Amy Weiss MRN: [id number] DOB: January 08, 1935 Today's Date: 12/30/2019    History of Present Illness Pt is an 85 y/o female admitted secondary to back pain and found to have an L1 compression fx. Neurosurgery was consulted and recommended a TLSO. PMH including but not limited to DM, HTN and TIA., pt underwent kyphoplasty 2/8.    PT Comments    Pt functioning at min guard level from mobility stand point. Pt with impaired cognition that would require 24/7 supervision due to safety, increased falls risk, decreased comprehension of situation and precautions. Discussed at length with dtr and patient d/c options. Dtr wants her to go to CIR however instructed she may not have a qualifying diagnoses for insurance to cover. Agree with dtr that SNF is not optimal due to cognitive deficits. Discussed hiring assist to help provide 24/7 supervision/assist. Pt strongly desires to return home. Pt safe to d/c home with 24/7 assist.    Follow Up Recommendations  CIR(vs HHPT with 24/7 assist)     Equipment Recommendations  Rolling walker   Recommendations for Other Services       Precautions / Restrictions Precautions Precautions: Fall;Back Precaution Comments: educated on log rolling Restrictions Weight Bearing Restrictions: No    Mobility  Bed Mobility Overal bed mobility: Needs Assistance Bed Mobility: Rolling;Sidelying to Sit;Sit to Supine Rolling: Min guard Sidelying to sit: Min guard     Sit to sidelying: Min guard General bed mobility comments: verbal cues for log rolling pt able to complete without physical assist  Transfers Overall transfer level: Needs assistance Equipment used: Rolling walker (2 wheeled) Transfers: Sit to/from Stand Sit to Stand: Min assist         General transfer comment: minA to power up, verbal cues for hand placement  Ambulation/Gait Ambulation/Gait assistance: Min guard Gait Distance (Feet): 400  Feet Assistive device: Rolling walker (2 wheeled) Gait Pattern/deviations: Step-through pattern;Decreased stride length Gait velocity: decreased Gait velocity interpretation: 1.31 - 2.62 ft/sec, indicative of limited community ambulator General Gait Details: min guard for safety and occasional walker management during turns   Social research officer, government Rankin (Stroke Patients Only)       Balance Overall balance assessment: Needs assistance Sitting-balance support: Feet supported Sitting balance-Leahy Scale: Good     Standing balance support: Bilateral upper extremity supported;Single extremity supported Standing balance-Leahy Scale: Fair Standing balance comment: used RW for safe ambulation                            Cognition Arousal/Alertness: Awake/alert Behavior During Therapy: WFL for tasks assessed/performed Overall Cognitive Status: Impaired/Different from baseline Area of Impairment: Memory;Safety/judgement;Problem solving                     Memory: Decreased short-term memory;Decreased recall of precautions   Safety/Judgement: Decreased awareness of deficits;Decreased awareness of safety   Problem Solving: Difficulty sequencing;Requires verbal cues General Comments: per dtr pt with undiagnosed dementia, pt oriented but without full comprehension of situation,      Exercises      General Comments General comments (skin integrity, edema, etc.): VSS      Pertinent Vitals/Pain Pain Assessment: Faces Faces Pain Scale: Hurts little more Pain Location: back, pt denies pain but then says ow with movement and then denies pain Pain Descriptors / Indicators: Grimacing;Guarding Pain Intervention(s): Monitored  during session    Home Living                      Prior Function            PT Goals (current goals can now be found in the care plan section) Acute Rehab PT Goals Patient Stated Goal: go  home Progress towards PT goals: Progressing toward goals    Frequency    Min 2X/week      PT Plan Current plan remains appropriate    Co-evaluation              AM-PAC PT "6 Clicks" Mobility   Outcome Measure  Help needed turning from your back to your side while in a flat bed without using bedrails?: A Little Help needed moving from lying on your back to sitting on the side of a flat bed without using bedrails?: A Little Help needed moving to and from a bed to a chair (including a wheelchair)?: A Little Help needed standing up from a chair using your arms (e.g., wheelchair or bedside chair)?: A Little Help needed to walk in hospital room?: A Little Help needed climbing 3-5 steps with a railing? : A Little 6 Click Score: 18    End of Session Equipment Utilized During Treatment: Gait belt Activity Tolerance: Patient limited by pain Patient left: in bed;with call bell/phone within reach;with family/visitor present Nurse Communication: Mobility status PT Visit Diagnosis: Other abnormalities of gait and mobility (R26.89);Pain     Time: 5400-8676 PT Time Calculation (min) (ACUTE ONLY): 28 min  Charges:  $Gait Training: 8-22 mins $Therapeutic Activity: 8-22 mins                     [name], PT, DPT Acute Rehabilitation Services Pager #: [phone] Office #: [phone]    Berline Lopes 12/30/2019, 2:57 PM

## 2019-12-30 NOTE — TOC Initial Note (Signed)
Transition of Care [facility]) - Initial/Assessment Note    Patient Details  Name: Amy Weiss MRN: [id number] Date of Birth: July 30, 1935  Transition of Care [facility]) CM/SW Contact:    Kingsley Plan, RN Phone Number: 12/30/2019, 3:56 PM  Clinical Narrative:                 Spoke to patient and daughter Amy Weiss at bedside.  They want [facility] for HHPT. Called Valerie with [facility] and she accepted referral.  Patient and Amy Weiss want OP Palliative Palliative Care, provided choice, they want [facility] referral given to [facility] and accepted.  Ordered 3 in1 from Zack with [facility]    Expected Discharge Plan: Home w Home Health Services Barriers to Discharge: Continued Medical Work up   Patient Goals and CMS Choice Patient states their goals for this hospitalization and ongoing recovery are:: to go home CMS Medicare.gov Compare Post Acute Care list provided to:: Patient Choice offered to / list presented to : Patient  Expected Discharge Plan and Services Expected Discharge Plan: Home w Home Health Services   Discharge Planning Services: CM Consult Post Acute Care Choice: Home Health Living arrangements for the past 2 months: Single Family Home                 DME Arranged: 3-N-1 DME Agency: [facility] Date DME Agency Contacted: 12/30/19 Time DME Agency Contacted: [phone] Representative spoke with at DME Agency: Zack HH Arranged: PT HH Agency: [facility] Date HH Agency Contacted: 12/30/19 Time HH Agency Contacted: [phone] Representative spoke with at [facility] Agency: Vikki Ports  Prior Living Arrangements/Services Living arrangements for the past 2 months: Single Family Home Lives with:: Self Patient language and need for interpreter reviewed:: Yes Do you feel safe going back to the place where you live?: Yes      Need for Family Participation in Patient Care: Yes (Comment) Care giver support system in place?: Yes (comment) Current home services:  DME Criminal Activity/Legal Involvement Pertinent to Current Situation/Hospitalization: No - Comment as needed  Activities of Daily Living Home Assistive Devices/Equipment: Dan Humphreys (specify type) ADL Screening (condition at time of admission) Patient's cognitive ability adequate to safely complete daily activities?: Yes Is the patient deaf or have difficulty hearing?: No Does the patient have difficulty seeing, even when wearing glasses/contacts?: No Does the patient have difficulty concentrating, remembering, or making decisions?: Yes Patient able to express need for assistance with ADLs?: No Does the patient have difficulty dressing or bathing?: No Independently performs ADLs?: Yes (appropriate for developmental age) Does the patient have difficulty walking or climbing stairs?: Yes Weakness of Legs: None Weakness of Arms/Hands: None  Permission Sought/Granted   Permission granted to share information with : Yes, Verbal Permission Granted  Share Information with NAME: Amy Weiss           Emotional Assessment Appearance:: Appears stated age Attitude/Demeanor/Rapport: Engaged Affect (typically observed): Accepting   Alcohol / Substance Use: Not Applicable Psych Involvement: No (comment)  Admission diagnosis:  Bradycardia [R00.1] Lumbar compression fracture (HCC) [S32.000A] Closed compression fracture of body of L1 vertebra (HCC) [S32.010A] Lumbar compression fracture, closed, initial encounter [facility]) [S32.000A] Patient Active Problem List   Diagnosis Date Noted  . Bradycardia 12/24/2019  . Lumbar compression fracture, closed, initial encounter (HCC) 12/24/2019  . Lumbar compression fracture (HCC) 12/23/2019  . Acute bilateral low back pain without sciatica 11/26/2019  . PCP NOTES >>>> 10/18/2015  . Atrioventricular block, Mobitz type 1, Wenckebach 12/10/2014  .  Eczema 12/29/2011  . Annual physical exam 05/09/2011  . SKIN LESION 05/06/2010  . Hyperglycemia 11/19/2008  . TIA  11/19/2008  . Hyperlipidemia 03/04/2008  . FAMILIAL TREMOR 03/04/2008  . Arrhythmia--h/o AV reentry tachycardia 03/04/2008  . ARTHRALGIA 03/04/2008  . OSTEOPOROSIS 03/04/2008  . Hypothyroidism 04/05/2007   PCP:  Colon Branch, MD Pharmacy:   [id number] - [place], [place] - [address] AT [address] [place] [address] Phone: [phone] Fax: [phone]     Social Determinants of Health (SDOH) Interventions    Readmission Risk Interventions No flowsheet data found.

## 2019-12-30 NOTE — Progress Notes (Signed)
Inpatient Rehab Admissions Coordinator:   Spoke to PT and met with pt at bedside.  Pt is currently too high level for CIR program and will need to follow up with home health therapy.  Pt/dtr also requesting palliative care consult.  Will let MD and CM know.   Shann Medal, PT, DPT Admissions Coordinator [phone] 12/30/19  2:29 PM

## 2019-12-31 ENCOUNTER — Telehealth: Payer: Self-pay

## 2019-12-31 DIAGNOSIS — Z515 Encounter for palliative care: Secondary | ICD-10-CM

## 2019-12-31 DIAGNOSIS — Z7189 Other specified counseling: Secondary | ICD-10-CM

## 2019-12-31 MED ORDER — HYDROCODONE-ACETAMINOPHEN 5-325 MG PO TABS: 1.0000 | ORAL_TABLET | Freq: Four times a day (QID) | ORAL | 0 refills | Status: DC | PRN

## 2019-12-31 MED ORDER — LIDOCAINE 5 % EX PTCH: 1.0000 | MEDICATED_PATCH | CUTANEOUS | 0 refills | Status: DC

## 2019-12-31 NOTE — Consult Note (Signed)
Consultation Note Date: 12/31/2019   Patient Name: Amy Weiss  DOB: 06-15-1935  MRN: [id number]  Age / Sex: 85 y.o., female  PCP: Amy Plump, MD Referring Physician: Dorcas Carrow, MD  Reason for Consultation: Establishing goals of care and Psychosocial/spiritual support   HPI/Patient Profile: 85 y.o. female  with past medical history of TIA, DM, SVT s/p ablation now with chronic bradycardia, Osteoporosis, and hypothyroidism who was admitted on 12/23/2019 with severe pain due to compression fracture.  Imagine reveals fractures at T12 and L1.  She underwent kyphoplasty on 2/8 with good results.  PMT was consulted at family request.  Clinical Assessment and Goals of Care:  I have reviewed medical records including EPIC notes, labs and imaging, received report from Dr. Jerral Weiss, examined the patient and spoke on the phone with her daughter and Amy Weiss, Amy Weiss about diagnosis, prognosis, GOC, EOL wishes, disposition and options.  I introduced Palliative Medicine as specialized medical care for people living with serious illness. It focuses on providing relief from the symptoms and stress of a serious illness.   We discussed a brief life review of the patient. Amy Weiss lives alone and is cared for by her two daughters.  She used to find great joy in going to the store each day but has become very isolated during COVID.  Per Amy Weiss her mother loves old westerns on TV, Amy Weiss, music and her I phone.  She is a deeply spiritual person and belongs to a [employer].  As far as functional and nutritional status Amy Weiss has lost 35 lbs unintentionally in the last 2 years.  Her daughters began to cook for her approximately 1 year ago, but she just does not have the appetite to eat.  Amy Weiss expresses concerns over malnutrition and dehydration.  She also expresses concerns about her mothers worsening  short term memory loss (undiagnosed dementia?).   Amy Weiss describes the program she and her sister have arranged to care for their mother complete with a notebook to document all of her important daily information (eating/sleeping/meds, etc..).   In December Amy Weiss had a tremendous set back as her L1 fractured causing her severe pain.  This was managed conservatively and appeared to improve - she had begun to be able to walk again.  Then suddenly the pain was so severe her mother was bed-bound.  After kyphoplasty her pain is improved, but Amy Weiss goals are to completely avoid aggressive medical care.  She wants to be comfortable in her home rather than pursue any further medical intervention.  I talked with Amy Weiss about updating the TSH just to see if a dose adjustment may improve her outlook.  I also suggested bisphosphonates be considered (would defer both issues to Amy Weiss expertise).  I did request a TSH be drawn prior to leaving the hospital so she would not have to go to Amy Weiss office to have that done.  We confirmed DNR.  We confirmed that Amy Weiss is Amy Weiss documented HCPOA.  Hospice  and Palliative Care services outpatient were explained and offered.  At this point [name] will discharge home with [facility].  She is [facility] so she is eligible for the [facility] program.  Questions and concerns were addressed.  The family was encouraged to call with questions or concerns.    Primary Decision Maker:  Amy Weiss    SUMMARY OF RECOMMENDATIONS   Patient being discharged with [facility] to Follow.  Patient does not wish to return to the Hospital.  She does not wish to pursue pace maker placement (pulse rate 30s-40s).  She does not want further aggressive medical interventions.   She is DNR.  Will ask lab to draw a TSH prior to discharge so that her PCP can evaluate her levothyroxine dosage.  Would evaluate for the  appropriateness of bisphosphonate therapy.  Recommend a shift to Hospice Services if/when [facility] feels this is appropriate    Code Status/Advance Care Planning:  DNR.   Symptom Management:   Per primary team.  Additional Recommendations (Limitations, Scope, Preferences):  Avoid Hospitalization, Minimize Medications and No Artificial Feeding  Palliative Prophylaxis:   Frequent Pain Assessment  Psycho-social/Spiritual:   Desire for further Chaplaincy support: welcomed.  Prognosis:   With a trajectory of continued weight loss, severe osteoporosis, worsening short term memory loss and severe bradycardia.  She could continue to decline rapidly and have a prognosis of less than 6 months.  However if she improves after kyphoplasty, begins eating/drinking well and is able to ambulate I am unable to determine her prognosis.  Will defer prognostication to outpatient MD and [facility] to see how she does after kyphoplasty    Discharge Planning: Home with Home Health and [facility]      Primary Diagnoses: Present on Admission: . Lumbar compression fracture (HCC) . Hypothyroidism . Lumbar compression fracture, closed, initial encounter (HCC)   I have reviewed the medical record, interviewed the patient and family, and examined the patient. The following aspects are pertinent.  Past Medical History:  Diagnosis Date  . Diabetes mellitus 4/09   A1C-6  . Familial tremor   . Hyperlipidemia   . Hypothyroidism   . Osteoporosis   . Tachycardia    AV Re-entry, s/p ablation aprox 2005  . TIA (transient ischemic attack) 12/09   Social History   Socioeconomic History  . Marital status: Divorced    Spouse name: Not on file  . Number of children: 4  . Years of education: Not on file  . Highest education level: Not on file  Occupational History  . 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 Smoking status: Former Smoker    Quit  date: 06/20/2012    Years since quitting: 7.5  . Smokeless tobacco: Never Used  Substance and Sexual Activity  . Alcohol use: Yes    Comment: beer rarely  . Drug use: No  . Sexual activity: Never  Other Topics Concern  . Not on file  Social History Narrative   Single, live by herself, lost a daughter in law   [name] lives close by   Ex- husband lives near   Social Determinants of Health   Financial Resource Strain:   . Difficulty of Paying Living Expenses: Not on file  Food Insecurity:   . Worried About Programme researcher, broadcasting/film/video in the Last Year: Not on file  . Ran Out of Food in the Last Year:  Not on file  Transportation Needs:   . Lack of Transportation (Medical): Not on file  . Lack of Transportation (Non-Medical): Not on file  Physical Activity:   . Days of Exercise per Week: Not on file  . Minutes of Exercise per Session: Not on file  Stress:   . Feeling of Stress : Not on file  Social Connections:   . Frequency of Communication with Friends and Family: Not on file  . Frequency of Social Gatherings with Friends and Family: Not on file  . Attends Religious Services: Not on file  . Active Member of Clubs or Organizations: Not on file  . Attends Archivist Meetings: Not on file  . Marital Status: Not on file   Family History  Problem Relation Age of Onset  . Pancreatic cancer Brother   . Pancreatic cancer Father   . Coronary artery disease Mother 62  . Breast cancer Neg Hx   . Colon cancer Neg Hx    Scheduled Meds: . aspirin  162 mg Oral Daily  . levothyroxine  88 mcg Oral QAC breakfast  . lidocaine  1 patch Transdermal Q24H  . polyethylene glycol  17 g Oral BID  . senna  8.6 mg Oral Daily   Continuous Infusions: PRN Meds:.acetaminophen **OR** acetaminophen, cyclobenzaprine, HYDROcodone-acetaminophen, ondansetron **OR** ondansetron (ZOFRAN) IV Allergies  Allergen Reactions  . Sulfonamide Derivatives Other (See Comments)    Unknown     Vital  Signs: BP (!) 129/50 (BP Location: Right Arm)   Pulse 70   Temp 98.3 F (36.8 C) (Oral)   Resp 14   Ht 5\' 7"  (1.702 m)   Wt 62.5 kg   SpO2 97%   BMI 21.57 kg/m  Pain Scale: 0-10   Pain Score: Asleep   SpO2: SpO2: 97 % O2 Device:SpO2: 97 % O2 Flow Rate: .   IO: Intake/output summary:   Intake/Output Summary (Last 24 hours) at 12/31/2019 0926 Last data filed at 12/30/2019 1800 Gross per 24 hour  Intake --  Output 350 ml  Net -350 ml    LBM: Last BM Date: 12/19/19 Baseline Weight: Weight: 62.5 kg Most recent weight: Weight: 62.5 kg     Palliative Assessment/Data: 40%     Time In: 9:30 Time Out: 10:39 Time Total: 69 min. Visit consisted of counseling and education dealing with the complex and emotionally intense issues surrounding the need for palliative care and symptom management in the setting of serious and potentially life-threatening illness. Greater than 50%  of this time was spent counseling and coordinating care related to the above assessment and plan.  Signed by: Florentina Jenny, PA-C Palliative Medicine  Please contact Palliative Medicine Team phone at [phone] for questions and concerns.  For individual provider: See Shea Evans

## 2019-12-31 NOTE — Telephone Encounter (Signed)
Please advise. Attending?

## 2019-12-31 NOTE — Progress Notes (Signed)
Occupational Therapy Treatment Patient Details Name: Amy Weiss MRN: [id number] DOB: October 18, 1935 Today's Date: 12/31/2019    History of present illness Pt is an 85 y/o female admitted secondary to back pain and found to have an L1 compression fx. Neurosurgery was consulted and recommended a TLSO. PMH including but not limited to DM, HTN and TIA., pt underwent kyphoplasty 2/8.   OT comments  Pt progressing towards acute OT goals. Daughter present and involved throughout session. D/c plan remains appropriate.    Follow Up Recommendations  Home health OT;Supervision/Assistance - 24 hour    Equipment Recommendations  3 in 1 bedside commode;Tub/shower bench    Recommendations for Other Services      Precautions / Restrictions Precautions Precautions: Fall;Back Precaution Booklet Issued: Yes (comment) Restrictions Weight Bearing Restrictions: No       Mobility Bed Mobility Overal bed mobility: Needs Assistance Bed Mobility: Rolling;Sidelying to Sit;Sit to Sidelying Rolling: Min guard Sidelying to sit: Min guard     Sit to sidelying: Min guard General bed mobility comments: verbal cues for log rolling pt able to complete without physical assist  Transfers Overall transfer level: Needs assistance Equipment used: Rolling walker (2 wheeled) Transfers: Sit to/from Stand Sit to Stand: Min assist         General transfer comment: minA to power up, verbal cues for hand placement    Balance Overall balance assessment: Needs assistance Sitting-balance support: Feet supported Sitting balance-Leahy Scale: Good     Standing balance support: Bilateral upper extremity supported;Single extremity supported Standing balance-Leahy Scale: Fair Standing balance comment: used RW for safe ambulation and for comfort                           ADL either performed or assessed with clinical judgement   ADL Overall ADL's : Needs assistance/impaired                          Toilet Transfer: Min guard;Regular Toilet;Grab bars;RW Statistician Details (indicate cue type and reason): simulated         Functional mobility during ADLs: Min guard;Rolling walker General ADL Comments: Pt verbalizing desire to walk in the hall. Walked a little more than half unit before pt quickly fatigued/increased pain needing chair ride back to room. Proviided back handout and education on ADLs to caregiver. Daughter present and involved throughout session     Vision       Perception     Praxis      Cognition Arousal/Alertness: Awake/alert Behavior During Therapy: WFL for tasks assessed/performed;Anxious Overall Cognitive Status: History of cognitive impairments - at baseline                                 General Comments: per dtr pt with undiagnosed dementia, pt oriented but without full comprehension of situation. Difficulty self-monitoring activity tolerance and adjusting tasks accordingly, needs cues for this.         Exercises     Shoulder Instructions       General Comments      Pertinent Vitals/ Pain       Pain Assessment: Faces Faces Pain Scale: Hurts whole lot Pain Location: back pain while ambulating in hall, needed chair ride back to room.  Pain Descriptors / Indicators: Grimacing;Guarding Pain Intervention(s): Limited activity within patient's tolerance;Monitored during session;Repositioned  Home Living  Prior Functioning/Environment              Frequency  Min 2X/week        Progress Toward Goals  OT Goals(current goals can now be found in the care plan section)  Progress towards OT goals: Progressing toward goals  Acute Rehab OT Goals Patient Stated Goal: go home OT Goal Formulation: With patient Time For Goal Achievement: 01/08/20 Potential to Achieve Goals: Good ADL Goals Pt Will Perform Upper Body Bathing: with supervision;sitting Pt  Will Perform Lower Body Bathing: with supervision;sit to/from stand;sitting/lateral leans;with adaptive equipment Pt Will Perform Upper Body Dressing: with supervision;sitting Pt Will Perform Lower Body Dressing: with supervision;sitting/lateral leans;sit to/from stand;with adaptive equipment Pt Will Transfer to Toilet: with supervision;stand pivot transfer Pt Will Perform Toileting - Clothing Manipulation and hygiene: with supervision;sitting/lateral leans;sit to/from stand  Plan Discharge plan needs to be updated    Co-evaluation                 AM-PAC OT "6 Clicks" Daily Activity     Outcome Measure   Help from another person eating meals?: None Help from another person taking care of personal grooming?: A Little Help from another person toileting, which includes using toliet, bedpan, or urinal?: A Lot Help from another person bathing (including washing, rinsing, drying)?: A Lot Help from another person to put on and taking off regular upper body clothing?: A Little Help from another person to put on and taking off regular lower body clothing?: A Lot 6 Click Score: 16    End of Session Equipment Utilized During Treatment: Rolling walker  OT Visit Diagnosis: Muscle weakness (generalized) (M62.81);Other symptoms and signs involving cognitive function;Pain   Activity Tolerance Patient limited by fatigue;Patient limited by pain;Patient tolerated treatment well   Patient Left in bed;with call bell/phone within reach;with bed alarm set;with family/visitor present   Nurse Communication          Time: 5621-3086 OT Time Calculation (min): 37 min  Charges: OT General Charges $OT Visit: 1 Visit OT Treatments $Self Care/Home Management : 23-37 mins  Tyrone Schimke, OT Acute Rehabilitation Services Pager: [phone] Office: [phone]    Amy Weiss 12/31/2019, 12:59 PM

## 2019-12-31 NOTE — Discharge Summary (Signed)
Physician Discharge Summary  DACOTA DEVALL XBD:[id number] DOB: [birth year] DOA: 12/23/2019  PCP: [name], MD  Admit date: 12/23/2019 Discharge date: 12/31/2019  Admitted From: Home Disposition: Home  Recommendations for Outpatient Follow-up:  1. Follow up with PCP in 1-2 weeks   Home Health: PT OT Discharge Condition: Stable CODE STATUS: DNR Diet recommendation: Regular diet  Discharge summary: 85 year old female with history of hypothyroidism and memory issues, SVT status post ablation with chronic sinus bradycardia, ongoing back pain issues with multiple interventions for last 1 month presented to ER with worsening pain and unable to ambulate.  Patient has some forgetfulness.  She lives alone.  Last week or so she has been more bedbound due to cold and had worsening pain.  [facility]  Lumbar spine CT scan showed acute L1 compression fracture and age-indeterminate T12 compression fracture.  Neurosurgery was consulted who requested T SLO brace and mobility.  Patient could not walk hence admitted to the hospital.  Patient had ongoing intractable back pain, MRI showed acute fractures of L1 and T12.  Underwent kyphoplasty with interventional radiology with very good results.  She is now ambulating with minimal help.  Pain is fairly controlled with use of Tylenol and occasional use of Vicodin.  Bowel movements are normal.  With good clinical improvement, she is going home with her daughter, will continue PT OT at home.  Also will have palliative medicine follow-up at home.  A short course of Vicodin was prescribed due to significant pain to use as needed.  Urine with coagulase-negative Staphylococcus 40,000 colonies no need to treat.  Discharge Diagnoses:  Principal Problem:   Lumbar compression fracture [facility]) Active Problems:   Hypothyroidism   Bradycardia   Lumbar compression fracture, closed, initial encounter [facility])    Discharge Instructions  Discharge  Instructions    Call MD for:  severe uncontrolled pain   Complete by: As directed    Diet general   Complete by: As directed    Increase activity slowly   Complete by: As directed      Allergies as of 12/31/2019      Reactions   Sulfonamide Derivatives Other (See Comments)   Unknown      Medication List    STOP taking these medications   pravastatin 80 MG tablet Commonly known as: PRAVACHOL     TAKE these medications   aspirin 81 MG tablet Take 81 mg by mouth daily.   CALTRATE 600 PLUS-VIT D PO Take 1 tablet by mouth daily. 1 a day   HYDROcodone-acetaminophen 5-325 MG tablet Commonly known as: NORCO/VICODIN Take 1 tablet by mouth every 6 (six) hours as needed for up to 5 days for moderate pain.   hydrocortisone 2.5 % cream Apply topically 2 (two) times daily.   levothyroxine 88 MCG tablet Commonly known as: SYNTHROID Take 1 tablet (88 mcg total) by mouth daily before breakfast.   lidocaine 5 % Commonly known as: LIDODERM Place 1 patch onto the skin daily for 6 days. Remove & Discard patch within 12 hours or as directed by MD Start taking on: January 01, 2020   multivitamin with minerals Tabs tablet Take 1 tablet by mouth daily.   senna 8.6 MG tablet Commonly known as: SENOKOT Take 1 tablet by mouth daily as needed for constipation.   TURMERIC PO Take 1 tablet by mouth daily.            Durable Medical Equipment  (From admission, onward)  Start     Ordered   12/30/19 1546  For home use only DME 3 n 1  Once     12/30/19 1545         Follow-up Information    Schedule an appointment as soon as possible for a visit  with Jadene Pierini, MD.   Specialty: Neurosurgery Why: to be seen this week Contact information: [address] [place] [address] [phone]          Allergies  Allergen Reactions  . Sulfonamide Derivatives Other (See Comments)    Unknown    Consultations:  Interventional  radiology   Procedures/Studies: CT Lumbar Spine Wo Contrast  Result Date: 12/23/2019 CLINICAL DATA:  Low back pain. EXAM: CT LUMBAR SPINE WITHOUT CONTRAST TECHNIQUE: Multidetector CT imaging of the lumbar spine was performed without intravenous contrast administration. Multiplanar CT image reconstructions were also generated. COMPARISON:  None. FINDINGS: Segmentation: 5 lumbar type vertebrae. Alignment: Normal. Vertebrae: Acute L1 vertebral body compression fracture with approximately 60% height loss and 6 mm of retropulsion of the superior posterior margin. Mild age-indeterminate T12 vertebral body compression fracture. No aggressive osseous lesion. Paraspinal and other soft tissues: No acute paraspinal abnormality. Abdominal aortic atherosclerosis. Mild abdominal aortic ectasia measuring 2.5 cm. Disc levels: Degenerative disc disease with disc height loss at L4-5. severe degenerative disease with disc height loss at L5-S1. Bilateral facet arthropathy at L5-S1. No significant foraminal stenosis. IMPRESSION: 1. Acute L1 vertebral body compression fracture with approximately 60% height loss and 6 mm of retropulsion of the superior posterior margin. 2. Mild age-indeterminate T12 vertebral body compression fracture. 3. 2.5 cm ectatic abdominal aorta, at risk for aneurysm development. Recommend follow-up aortic ultrasound in 5 years. This recommendation follows ACR consensus guidelines: White Paper of the ACR Incidental Findings Committee II on Vascular Findings. J Am Coll Radiol 2013; 24:097-353. 4.  Aortic Atherosclerosis (ICD10-I70.0). Electronically Signed   By: Elige Ko   On: 12/23/2019 14:12   MR LUMBAR SPINE WO CONTRAST  Result Date: 12/25/2019 CLINICAL DATA:  Lumbar compression fracture EXAM: MRI LUMBAR SPINE WITHOUT CONTRAST TECHNIQUE: Multiplanar, multisequence MR imaging of the lumbar spine was performed. No intravenous contrast was administered. COMPARISON:  CT 12/23/2019 FINDINGS: Segmentation:   Normal Alignment:  Normal Vertebrae: Mild superior endplate compression fracture of T12 with bone marrow edema. No change from the prior CT. Moderate compression fracture of L1 with approximately 50% loss of vertebral body height and diffuse bone marrow edema. Slight retropulsion of the superior endplate of L1 into the canal without significant stenosis. No change from recent CT. No other fracture or evidence of metastatic disease in the lumbar spine Conus medullaris and cauda equina: Conus extends to the L1-2 level. Conus and cauda equina appear normal. Paraspinal and other soft tissues: Negative Disc levels: T12-L1: Mild retropulsion of L1 into the spinal canal without significant spinal stenosis. L1-2: Mild disc degeneration without stenosis L2-3: Negative L3-4: Mild disc degeneration. Negative for disc protrusion or stenosis L4-5: Mild to moderate disc degeneration with disc space narrowing. Small central disc protrusion. Negative for stenosis L5-S1: Disc degeneration with disc space narrowing. Mild endplate spurring on the left without significant foraminal stenosis IMPRESSION: Mild compression fracture T12 appears acute Moderate compression fracture L1 appears acute. Mild retropulsion of the superior endplate of L1 into the canal without significant stenosis Mild lumbar degenerative change without spinal stenosis. Electronically Signed   By: Marlan Palau M.D.   On: 12/25/2019 14:21      Subjective: Patient seen  and examined.  Daughter at bedside.  Looking forward to go home.  No other overnight events.  Used 1 Vicodin last 24 hours.   Discharge Exam: Vitals:   12/31/19 0500 12/31/19 0741  BP: 129/66 (!) 129/50  Pulse: 61 70  Resp: 16 14  Temp: 98.2 F (36.8 C) 98.3 F (36.8 C)  SpO2: 98% 97%   Vitals:   12/30/19 1427 12/30/19 1949 12/31/19 0500 12/31/19 0741  BP: (!) 127/51 (!) 149/56 129/66 (!) 129/50  Pulse: (!) 40 (!) 55 61 70  Resp: 17 18 16 14   Temp: 97.9 F (36.6 C) 97.7 F  (36.5 C) 98.2 F (36.8 C) 98.3 F (36.8 C)  TempSrc: Oral Oral Oral Oral  SpO2: 96% 98% 98% 97%  Weight:      Height:        General: Pt is alert, awake, not in acute distress Cardiovascular: RRR, S1/S2 +, no rubs, no gallops Respiratory: CTA bilaterally, no wheezing, no rhonchi Abdominal: Soft, NT, ND, bowel sounds + Extremities: no edema, no cyanosis No neurological deficit. Able to get up and walk with some discomfort on her back. She is alert oriented x3, has short-term memory loss.    The results of significant diagnostics from this hospitalization (including imaging, microbiology, ancillary and laboratory) are listed below for reference.     Microbiology: Recent Results (from the past 240 hour(s))  SARS CORONAVIRUS 2 (TAT 6-24 HRS) Nasopharyngeal Nasopharyngeal Swab     Status: None   Collection Time: 12/23/19  5:44 PM   Specimen: Nasopharyngeal Swab  Result Value Ref Range Status   SARS Coronavirus 2 NEGATIVE NEGATIVE Final    Comment: (NOTE) SARS-CoV-2 target nucleic acids are NOT DETECTED. The SARS-CoV-2 RNA is generally detectable in upper and lower respiratory specimens during the acute phase of infection. Negative results do not preclude SARS-CoV-2 infection, do not rule out co-infections with other pathogens, and should not be used as the sole basis for treatment or other patient management decisions. Negative results must be combined with clinical observations, patient history, and epidemiological information. The expected result is Negative. Fact Sheet for Patients: SugarRoll.be Fact Sheet for Healthcare Providers: https://www.woods-mathews.com/ This test is not yet approved or cleared by the [place] FDA and  has been authorized for detection and/or diagnosis of SARS-CoV-2 by FDA under an Emergency Use Authorization (EUA). This EUA will remain  in effect (meaning this test can be used) for the duration of  the COVID-19 declaration under Section 56 4(b)(1) of the Act, 21 U.S.C. section 360bbb-3(b)(1), unless the authorization is terminated or revoked sooner. Performed at [facility], [place] [address] [place], [place] [address]   Urine Culture     Status: Abnormal   Collection Time: 12/26/19  9:31 AM   Specimen: Urine, Clean Catch  Result Value Ref Range Status   Specimen Description URINE, CLEAN CATCH  Final   Special Requests   Final    NONE Performed at [facility], [place] [address], [place],  [address]    Culture 40,000 COLONIES/mL STAPHYLOCOCCUS LUGDUNENSIS (A)  Final   Report Status 12/28/2019 FINAL  Final   Organism ID, Bacteria STAPHYLOCOCCUS LUGDUNENSIS (A)  Final      Susceptibility   Staphylococcus lugdunensis - MIC*    CIPROFLOXACIN <=0.5 SENSITIVE Sensitive     GENTAMICIN <=0.5 SENSITIVE Sensitive     NITROFURANTOIN <=16 SENSITIVE Sensitive     OXACILLIN 2 SENSITIVE Sensitive     TETRACYCLINE <=1 SENSITIVE Sensitive  VANCOMYCIN <=0.5 SENSITIVE Sensitive     TRIMETH/SULFA <=10 SENSITIVE Sensitive     CLINDAMYCIN <=0.25 SENSITIVE Sensitive     RIFAMPIN <=0.5 SENSITIVE Sensitive     Inducible Clindamycin NEGATIVE Sensitive     * 40,000 COLONIES/mL STAPHYLOCOCCUS LUGDUNENSIS     Labs: BNP (last 3 results) No results for input(s): BNP in the last 8760 hours. Basic Metabolic Panel: No results for input(s): NA, K, CL, CO2, GLUCOSE, BUN, CREATININE, CALCIUM, MG, PHOS in the last 168 hours. Liver Function Tests: No results for input(s): AST, ALT, ALKPHOS, BILITOT, PROT, ALBUMIN in the last 168 hours. No results for input(s): LIPASE, AMYLASE in the last 168 hours. No results for input(s): AMMONIA in the last 168 hours. CBC: Recent Labs  Lab 12/29/19 0941  WBC 6.4  NEUTROABS 4.0  HGB 14.4  HCT 43.5  MCV 91.8  PLT 290   Cardiac Enzymes: No results for input(s): CKTOTAL, CKMB, CKMBINDEX, TROPONINI in the last 168 hours. BNP: Invalid input(s):  POCBNP CBG: No results for input(s): GLUCAP in the last 168 hours. D-Dimer No results for input(s): DDIMER in the last 72 hours. Hgb A1c No results for input(s): HGBA1C in the last 72 hours. Lipid Profile No results for input(s): CHOL, HDL, LDLCALC, TRIG, CHOLHDL, LDLDIRECT in the last 72 hours. Thyroid function studies No results for input(s): TSH, T4TOTAL, T3FREE, THYROIDAB in the last 72 hours.  Invalid input(s): FREET3 Anemia work up No results for input(s): VITAMINB12, FOLATE, FERRITIN, TIBC, IRON, RETICCTPCT in the last 72 hours. Urinalysis    Component Value Date/Time   COLORURINE YELLOW 12/26/2019 0926   APPEARANCEUR CLEAR 12/26/2019 0926   LABSPEC 1.011 12/26/2019 0926   PHURINE 6.0 12/26/2019 0926   GLUCOSEU NEGATIVE 12/26/2019 0926   GLUCOSEU NEGATIVE 10/13/2014 1615   HGBUR NEGATIVE 12/26/2019 0926   BILIRUBINUR NEGATIVE 12/26/2019 0926   KETONESUR NEGATIVE 12/26/2019 0926   PROTEINUR NEGATIVE 12/26/2019 0926   UROBILINOGEN 0.2 10/13/2014 1615   NITRITE NEGATIVE 12/26/2019 0926   LEUKOCYTESUR SMALL (A) 12/26/2019 0926   Sepsis Labs Invalid input(s): PROCALCITONIN,  WBC,  LACTICIDVEN Microbiology Recent Results (from the past 240 hour(s))  SARS CORONAVIRUS 2 (TAT 6-24 HRS) Nasopharyngeal Nasopharyngeal Swab     Status: None   Collection Time: 12/23/19  5:44 PM   Specimen: Nasopharyngeal Swab  Result Value Ref Range Status   SARS Coronavirus 2 NEGATIVE NEGATIVE Final    Comment: (NOTE) SARS-CoV-2 target nucleic acids are NOT DETECTED. The SARS-CoV-2 RNA is generally detectable in upper and lower respiratory specimens during the acute phase of infection. Negative results do not preclude SARS-CoV-2 infection, do not rule out co-infections with other pathogens, and should not be used as the sole basis for treatment or other patient management decisions. Negative results must be combined with clinical observations, patient history, and epidemiological  information. The expected result is Negative. Fact Sheet for Patients: HairSlick.no Fact Sheet for Healthcare Providers: quierodirigir.com This test is not yet approved or cleared by the [place] FDA and  has been authorized for detection and/or diagnosis of SARS-CoV-2 by FDA under an Emergency Use Authorization (EUA). This EUA will remain  in effect (meaning this test can be used) for the duration of the COVID-19 declaration under Section 56 4(b)(1) of the Act, 21 U.S.C. section 360bbb-3(b)(1), unless the authorization is terminated or revoked sooner. Performed at [facility], [address] [place], [place] [address]   Urine Culture     Status: Abnormal   Collection Time: 12/26/19  9:31 AM   Specimen: Urine, Clean Catch  Result Value Ref Range Status   Specimen Description URINE, CLEAN CATCH  Final   Special Requests   Final    NONE Performed at [facility], [address], [place], [place] [address]    Culture 40,000 COLONIES/mL STAPHYLOCOCCUS LUGDUNENSIS (A)  Final   Report Status 12/28/2019 FINAL  Final   Organism ID, Bacteria STAPHYLOCOCCUS LUGDUNENSIS (A)  Final      Susceptibility   Staphylococcus lugdunensis - MIC*    CIPROFLOXACIN <=0.5 SENSITIVE Sensitive     GENTAMICIN <=0.5 SENSITIVE Sensitive     NITROFURANTOIN <=16 SENSITIVE Sensitive     OXACILLIN 2 SENSITIVE Sensitive     TETRACYCLINE <=1 SENSITIVE Sensitive     VANCOMYCIN <=0.5 SENSITIVE Sensitive     TRIMETH/SULFA <=10 SENSITIVE Sensitive     CLINDAMYCIN <=0.25 SENSITIVE Sensitive     RIFAMPIN <=0.5 SENSITIVE Sensitive     Inducible Clindamycin NEGATIVE Sensitive     * 40,000 COLONIES/mL STAPHYLOCOCCUS LUGDUNENSIS     Time coordinating discharge:  40 minutes  SIGNED:   Dorcas Carrow, MD  [facility] 12/31/2019, 9:35 AM

## 2019-12-31 NOTE — Telephone Encounter (Signed)
[facility] is palliative care.

## 2019-12-31 NOTE — Care Management Important Message (Signed)
Important Message  Patient Details  Name: Amy Weiss MRN: [id number] Date of Birth: [birth year]   Medicare Important Message Given:  Yes     Mardene Sayer 12/31/2019, 10:25 AM

## 2019-12-31 NOTE — Telephone Encounter (Signed)
Amy Weiss with [facility] called in to get a verbal order with the care connection program. From Dr. Drue Novel     Please call Amy Weiss at [phone] as soon as possible  Thanks,

## 2019-12-31 NOTE — Progress Notes (Signed)
Discharge instructions addressed; Pt in stable condition; Pt.'s daughter in the room present and will be her ride at discharge.

## 2019-12-31 NOTE — Telephone Encounter (Signed)
I agreed to be the attending

## 2019-12-31 NOTE — Plan of Care (Signed)

## 2019-12-31 NOTE — Consult Note (Signed)
[facility]- [facility] RN has reached out to dtr [name] and pt to review CC services telephonically.  Pt and dtr are agreeable to with moving forward with CC program.  Pt is set up for admission visit in her home on 01/01/20.  Thank you for this referral and the opportunity to serve pt and family.  Julius Bowels, RN

## 2019-12-31 NOTE — Telephone Encounter (Signed)
Please find out what is the [facility],  I am not familiar with them. If this is a home health agency, that provides PT:  I agree

## 2019-12-31 NOTE — Telephone Encounter (Signed)
Spoke w/ Randa Evens- informed PCP will be attending for palliative care.

## 2020-01-01 ENCOUNTER — Telehealth: Payer: Self-pay | Admitting: *Deleted

## 2020-01-01 ENCOUNTER — Telehealth: Payer: Self-pay

## 2020-01-01 DIAGNOSIS — M25551 Pain in right hip: Secondary | ICD-10-CM

## 2020-01-01 DIAGNOSIS — W19XXXA Unspecified fall, initial encounter: Secondary | ICD-10-CM

## 2020-01-01 NOTE — Telephone Encounter (Signed)
Transition Care Management Follow-up Telephone Call   Date discharged? 12/31/19  Spoke w/ daughter Amy Weiss [facility]). Pt was resting. Dtr states they have a lot going on. Reports RN from [facility] is coming out this afternoon. [name] said she will call to schedule PCP follow up after meeting with the RN. Dtr is trying to arrange around the clock care for her mother.

## 2020-01-01 NOTE — Telephone Encounter (Signed)
Spoke w/ Roxanne, RN from [facility]- Pt had fall today- is complaining of R hip pain. Is taking Vicodin q6h prn- and Tylenol. I recommended maybe urgent care for x-ray of hip to make sure Pt does not have fracture- informed PCP out of office until morning. Amy Weiss informed that their palliative doctors can order mobile x-ray and she will have them set up. She will keep [place] updated w/ results.

## 2020-01-01 NOTE — Telephone Encounter (Signed)
Roxanne, RN called back- she spoke w/ Dr. Wandra Feinstein at [facility]- who believed a mobile x-ray wouldn't show enough and wouldn't be able to go out until tomorrow for x-ray anyway- she recommended Pt go to ED for evaluation which Pt absolutely refuses. Dr. Aaron Edelman did increase the Vicodin prescription to q4h prn until PCP returns to office tomorrow morning. Leandro Reasoner can be reached at [phone].

## 2020-01-01 NOTE — Telephone Encounter (Signed)
Recommend to proceed with a mobile x-ray.  At this point is essentially our only alternative

## 2020-01-02 ENCOUNTER — Other Ambulatory Visit: Payer: Self-pay

## 2020-01-02 ENCOUNTER — Encounter: Payer: Self-pay | Admitting: Internal Medicine

## 2020-01-02 ENCOUNTER — Telehealth: Payer: Self-pay

## 2020-01-02 ENCOUNTER — Inpatient Hospital Stay (HOSPITAL_COMMUNITY)
Admission: AD | Admit: 2020-01-02 | Discharge: 2020-01-14 | DRG: 517 | Disposition: A | Discharge status: Skilled Nursing Facility | Payer: Medicare HMO | Attending: Student | Admitting: Student

## 2020-01-02 ENCOUNTER — Ambulatory Visit (INDEPENDENT_AMBULATORY_CARE_PROVIDER_SITE_OTHER): Payer: Medicare HMO | Admitting: Internal Medicine

## 2020-01-02 VITALS — BP 92/60 | HR 42 | Ht 67.0 in | Wt 137.0 lb

## 2020-01-02 DIAGNOSIS — G250 Essential tremor: Secondary | ICD-10-CM | POA: Diagnosis present

## 2020-01-02 DIAGNOSIS — I350 Nonrheumatic aortic (valve) stenosis: Secondary | ICD-10-CM | POA: Diagnosis present

## 2020-01-02 DIAGNOSIS — E785 Hyperlipidemia, unspecified: Secondary | ICD-10-CM | POA: Diagnosis present

## 2020-01-02 DIAGNOSIS — R296 Repeated falls: Secondary | ICD-10-CM | POA: Diagnosis present

## 2020-01-02 DIAGNOSIS — R001 Bradycardia, unspecified: Secondary | ICD-10-CM | POA: Diagnosis present

## 2020-01-02 DIAGNOSIS — R4182 Altered mental status, unspecified: Secondary | ICD-10-CM | POA: Diagnosis not present

## 2020-01-02 DIAGNOSIS — Z8673 Personal history of transient ischemic attack (TIA), and cerebral infarction without residual deficits: Secondary | ICD-10-CM

## 2020-01-02 DIAGNOSIS — E162 Hypoglycemia, unspecified: Secondary | ICD-10-CM | POA: Diagnosis not present

## 2020-01-02 DIAGNOSIS — W1830XA Fall on same level, unspecified, initial encounter: Secondary | ICD-10-CM | POA: Diagnosis present

## 2020-01-02 DIAGNOSIS — S32000D Wedge compression fracture of unspecified lumbar vertebra, subsequent encounter for fracture with routine healing: Secondary | ICD-10-CM | POA: Diagnosis not present

## 2020-01-02 DIAGNOSIS — E039 Hypothyroidism, unspecified: Secondary | ICD-10-CM | POA: Diagnosis present

## 2020-01-02 DIAGNOSIS — R7303 Prediabetes: Secondary | ICD-10-CM | POA: Diagnosis present

## 2020-01-02 DIAGNOSIS — W19XXXA Unspecified fall, initial encounter: Secondary | ICD-10-CM | POA: Diagnosis not present

## 2020-01-02 DIAGNOSIS — M549 Dorsalgia, unspecified: Secondary | ICD-10-CM | POA: Diagnosis not present

## 2020-01-02 DIAGNOSIS — S32020A Wedge compression fracture of second lumbar vertebra, initial encounter for closed fracture: Secondary | ICD-10-CM | POA: Diagnosis not present

## 2020-01-02 DIAGNOSIS — F0390 Unspecified dementia without behavioral disturbance: Secondary | ICD-10-CM | POA: Diagnosis present

## 2020-01-02 DIAGNOSIS — K5903 Drug induced constipation: Secondary | ICD-10-CM | POA: Diagnosis present

## 2020-01-02 DIAGNOSIS — M810 Age-related osteoporosis without current pathological fracture: Secondary | ICD-10-CM | POA: Diagnosis present

## 2020-01-02 DIAGNOSIS — Z20822 Contact with and (suspected) exposure to covid-19: Secondary | ICD-10-CM | POA: Diagnosis present

## 2020-01-02 DIAGNOSIS — Z515 Encounter for palliative care: Secondary | ICD-10-CM | POA: Diagnosis not present

## 2020-01-02 DIAGNOSIS — Z79890 Hormone replacement therapy (postmenopausal): Secondary | ICD-10-CM

## 2020-01-02 DIAGNOSIS — Z90710 Acquired absence of both cervix and uterus: Secondary | ICD-10-CM | POA: Diagnosis not present

## 2020-01-02 DIAGNOSIS — S32020D Wedge compression fracture of second lumbar vertebra, subsequent encounter for fracture with routine healing: Secondary | ICD-10-CM | POA: Diagnosis not present

## 2020-01-02 DIAGNOSIS — Z7982 Long term (current) use of aspirin: Secondary | ICD-10-CM

## 2020-01-02 DIAGNOSIS — M545 Low back pain, unspecified: Secondary | ICD-10-CM

## 2020-01-02 DIAGNOSIS — Z9181 History of falling: Secondary | ICD-10-CM | POA: Diagnosis not present

## 2020-01-02 DIAGNOSIS — T402X5A Adverse effect of other opioids, initial encounter: Secondary | ICD-10-CM | POA: Diagnosis present

## 2020-01-02 DIAGNOSIS — S32000A Wedge compression fracture of unspecified lumbar vertebra, initial encounter for closed fracture: Secondary | ICD-10-CM

## 2020-01-02 DIAGNOSIS — E568 Deficiency of other vitamins: Secondary | ICD-10-CM | POA: Diagnosis not present

## 2020-01-02 DIAGNOSIS — R531 Weakness: Secondary | ICD-10-CM | POA: Diagnosis not present

## 2020-01-02 DIAGNOSIS — S22080D Wedge compression fracture of T11-T12 vertebra, subsequent encounter for fracture with routine healing: Secondary | ICD-10-CM | POA: Diagnosis not present

## 2020-01-02 DIAGNOSIS — S32010D Wedge compression fracture of first lumbar vertebra, subsequent encounter for fracture with routine healing: Secondary | ICD-10-CM

## 2020-01-02 DIAGNOSIS — R4189 Other symptoms and signs involving cognitive functions and awareness: Secondary | ICD-10-CM

## 2020-01-02 DIAGNOSIS — Z800 Family history of malignant neoplasm of digestive organs: Secondary | ICD-10-CM

## 2020-01-02 DIAGNOSIS — M2550 Pain in unspecified joint: Secondary | ICD-10-CM | POA: Diagnosis not present

## 2020-01-02 DIAGNOSIS — I959 Hypotension, unspecified: Secondary | ICD-10-CM | POA: Diagnosis not present

## 2020-01-02 DIAGNOSIS — Z8249 Family history of ischemic heart disease and other diseases of the circulatory system: Secondary | ICD-10-CM

## 2020-01-02 DIAGNOSIS — W1830XD Fall on same level, unspecified, subsequent encounter: Secondary | ICD-10-CM

## 2020-01-02 DIAGNOSIS — S79911A Unspecified injury of right hip, initial encounter: Secondary | ICD-10-CM | POA: Diagnosis not present

## 2020-01-02 DIAGNOSIS — Z87891 Personal history of nicotine dependence: Secondary | ICD-10-CM

## 2020-01-02 DIAGNOSIS — M25551 Pain in right hip: Secondary | ICD-10-CM | POA: Diagnosis not present

## 2020-01-02 DIAGNOSIS — Z7401 Bed confinement status: Secondary | ICD-10-CM | POA: Diagnosis not present

## 2020-01-02 DIAGNOSIS — Z66 Do not resuscitate: Secondary | ICD-10-CM

## 2020-01-02 DIAGNOSIS — R52 Pain, unspecified: Secondary | ICD-10-CM | POA: Diagnosis not present

## 2020-01-02 DIAGNOSIS — E7849 Other hyperlipidemia: Secondary | ICD-10-CM | POA: Diagnosis not present

## 2020-01-02 DIAGNOSIS — I361 Nonrheumatic tricuspid (valve) insufficiency: Secondary | ICD-10-CM | POA: Diagnosis not present

## 2020-01-02 DIAGNOSIS — G8929 Other chronic pain: Secondary | ICD-10-CM | POA: Diagnosis not present

## 2020-01-02 DIAGNOSIS — I499 Cardiac arrhythmia, unspecified: Secondary | ICD-10-CM | POA: Diagnosis not present

## 2020-01-02 DIAGNOSIS — I340 Nonrheumatic mitral (valve) insufficiency: Secondary | ICD-10-CM | POA: Diagnosis not present

## 2020-01-02 DIAGNOSIS — R69 Illness, unspecified: Secondary | ICD-10-CM | POA: Diagnosis not present

## 2020-01-02 DIAGNOSIS — R627 Adult failure to thrive: Secondary | ICD-10-CM | POA: Diagnosis present

## 2020-01-02 DIAGNOSIS — Z79899 Other long term (current) drug therapy: Secondary | ICD-10-CM

## 2020-01-02 DIAGNOSIS — Z882 Allergy status to sulfonamides status: Secondary | ICD-10-CM

## 2020-01-02 DIAGNOSIS — M6281 Muscle weakness (generalized): Secondary | ICD-10-CM | POA: Diagnosis not present

## 2020-01-02 LAB — CBC
HCT: 41.6 % (ref 36.0–46.0)
Hemoglobin: 13.6 g/dL (ref 12.0–15.0)
MCH: 29.9 pg (ref 26.0–34.0)
MCHC: 32.7 g/dL (ref 30.0–36.0)
MCV: 91.4 fL (ref 80.0–100.0)
Platelets: 271 10*3/uL (ref 150–400)
RBC: 4.55 MIL/uL (ref 3.87–5.11)
RDW: 15 % (ref 11.5–15.5)
WBC: 7.1 10*3/uL (ref 4.0–10.5)
nRBC: 0 % (ref 0.0–0.2)

## 2020-01-02 LAB — CREATININE, SERUM
Creatinine, Ser: 0.67 mg/dL (ref 0.44–1.00)
GFR calc Af Amer: >60 mL/min (ref >60)
GFR calc non Af Amer: >60 mL/min (ref >60)

## 2020-01-02 MED ORDER — ASPIRIN EC 81 MG PO TBEC
81.0000 mg | DELAYED_RELEASE_TABLET | Freq: Every day | ORAL | Status: DC
  Administered 2020-01-02 – 2020-01-14 (×12): 81 mg via ORAL
  Filled 2020-01-02 (×12): qty 1

## 2020-01-02 MED ORDER — LEVOTHYROXINE SODIUM 88 MCG PO TABS
88.0000 ug | ORAL_TABLET | Freq: Every day | ORAL | Status: DC
  Administered 2020-01-03 – 2020-01-14 (×12): 88 ug via ORAL
  Filled 2020-01-02 (×12): qty 1

## 2020-01-02 MED ORDER — ADULT MULTIVITAMIN W/MINERALS CH
1.0000 | ORAL_TABLET | Freq: Every day | ORAL | Status: DC
  Administered 2020-01-02 – 2020-01-14 (×12): 1 via ORAL
  Filled 2020-01-02 (×12): qty 1

## 2020-01-02 MED ORDER — LORAZEPAM 2 MG/ML IJ SOLN
1.0000 mg | Freq: Once | INTRAMUSCULAR | Status: AC
  Administered 2020-01-03: 1 mg via INTRAVENOUS
  Filled 2020-01-02: qty 1

## 2020-01-02 MED ORDER — VITAMIN D (ERGOCALCIFEROL) 1.25 MG (50000 UNIT) PO CAPS
50000.0000 [IU] | ORAL_CAPSULE | ORAL | Status: DC
  Administered 2020-01-03 – 2020-01-10 (×2): 50000 [IU] via ORAL
  Filled 2020-01-02 (×3): qty 1

## 2020-01-02 MED ORDER — HYDROCODONE-ACETAMINOPHEN 5-325 MG PO TABS
1.0000 | ORAL_TABLET | Freq: Four times a day (QID) | ORAL | Status: DC | PRN
  Administered 2020-01-03 – 2020-01-07 (×12): 1 via ORAL
  Filled 2020-01-02 (×12): qty 1

## 2020-01-02 MED ORDER — SENNA 8.6 MG PO TABS
1.0000 | ORAL_TABLET | Freq: Every day | ORAL | Status: DC | PRN
  Administered 2020-01-09: 22:00:00 8.6 mg via ORAL
  Filled 2020-01-02: qty 1

## 2020-01-02 MED ORDER — CYCLOBENZAPRINE HCL 10 MG PO TABS
5.0000 mg | ORAL_TABLET | Freq: Two times a day (BID) | ORAL | Status: DC | PRN
  Administered 2020-01-03: 10 mg via ORAL
  Filled 2020-01-02: qty 1

## 2020-01-02 MED ORDER — SENNOSIDES-DOCUSATE SODIUM 8.6-50 MG PO TABS
2.0000 | ORAL_TABLET | Freq: Every day | ORAL | Status: DC
  Administered 2020-01-02 – 2020-01-12 (×10): 2 via ORAL
  Filled 2020-01-02 (×11): qty 2

## 2020-01-02 MED ORDER — POLYETHYLENE GLYCOL 3350 17 G PO PACK
17.0000 g | PACK | Freq: Every day | ORAL | Status: DC | PRN
  Administered 2020-01-04 – 2020-01-05 (×2): 17 g via ORAL
  Filled 2020-01-02 (×2): qty 1

## 2020-01-02 MED ORDER — ENOXAPARIN SODIUM 40 MG/0.4ML ~~LOC~~ SOLN
40.0000 mg | SUBCUTANEOUS | Status: DC
  Administered 2020-01-02 – 2020-01-04 (×3): 40 mg via SUBCUTANEOUS
  Filled 2020-01-02 (×3): qty 0.4

## 2020-01-02 MED ORDER — LIDOCAINE 5 % EX PTCH
1.0000 | MEDICATED_PATCH | CUTANEOUS | Status: DC
  Administered 2020-01-02 – 2020-01-04 (×3): 1 via TRANSDERMAL
  Filled 2020-01-02 (×3): qty 1

## 2020-01-02 NOTE — Telephone Encounter (Signed)
Spoke w/ Amy Weiss this morning- asked if they were able to go today for mobile x-ray. She informed they use [facility]- she could get me information if needed however she spoke w/ Amy Weiss, [name] last night and she seemed ready to take Pt back to the hospital/ED for treatment- she recommended I follow-up w/ Amy Weiss first to see what they wanted to do.   Spoke w/ Amy Weiss- she informed Pt is in pain but was able to rest some w/ the increase in Vicodin to q4h prn. She again has changed her mind and doesn't want to take her mom to the ED as they had to wait 12+ hours for a bed at [facility] last time- they would be willing to bring Pt for an outpatient x-ray if they can get her here. I recommended to call non-urgent EMS let them know Pt has compression fracture and has fallen and needs to be brought for R hip x-ray. I informed Amy Weiss- [name] that depending on results Pt will have to go back to ED if x-ray shows fracture- [name] requests direct admit- informed I can't promise this. She also wants the dx of dementia noted in Pt's chart- I informed I am unable to do this- would need to be discussed w/ PCP at time of hosp f/u- scheduled for Monday (pending what x-ray shows).

## 2020-01-02 NOTE — Plan of Care (Signed)

## 2020-01-02 NOTE — Telephone Encounter (Signed)
Please call the patients daughter back at [phone] to Discuss some additional options for the patient.

## 2020-01-02 NOTE — Progress Notes (Signed)
Pt arrived to the unit. [name] at bedside. Pt not in distress and tolerated well.

## 2020-01-02 NOTE — Progress Notes (Signed)
Pre visit review using our clinic review tool, if applicable. No additional management support is needed unless otherwise documented below in the visit note. 

## 2020-01-02 NOTE — H&P (Signed)
History and Physical  Amy Weiss MNO:[id number] DOB: Apr 16, 1935 DOA: 01/02/2020 1712  Referring physician: Arlana Pouch PCP: [name], MD  Outpatient Specialists: sports medicine [facility] JE), interventional radiology  HISTORY   Chief Complaint: back pain after mechanical fall  HPI: Amy Weiss is a 85 y.o. female with hx of DMT2 (diet controlled), hypothyroidism, forgetfulness (?mild dementia per daughter), TIA who presents with acute lower back pain after a mechanical fall onto her coccyx s/p recent kyphoplasty (on 2/8) at T12 and L1 for compression fractures. Patient is alert and oriented, but states that she does not remember details of the day prior, so daughter (caretaker) provided the history at bedside.   Per daughter, patient had been recovering well since her kyphoplasty last week by IR and was able to ambulate using a walker. Needing only 2-3 tabs of vicodin 5-325mg  per day. Yesterday morning, while ambulating with a walker, patient was navigating one step in the living room and then pulled back (extending her back) because she thought she was falling forward and likely overcorrected -- she fell onto her lower back/coccyx region as witnessed by her daughter, who was actually standing in front of her (ahead of the step). After the fall, patient reported excruciating lower back pain, similar to the pain she had prior to the kyphoplasty and was unable to move without assistance.   Patient contacted her PCP who recommended that patient be evaluated at the ED. Patient and family reportedly refused to come to the ED because of concern for long wait times and thus PCP requested direct admission for further workup.   Patient reports no issues with urinary incontinence or emptying her bladder. She does report constipation x 3-4 days. Takes prn miralax and senna. On aspirin 81mg  for risk reduction; not on anticoagulation.   Otherwise had been in her usual state of health. Daughter  reports that since December in the setting of her back issues, the patient seems to be increasingly forgetful -- concerned about mild dementia.   Review of Systems: +back pain as above - no fevers/chills - no cough - no chest pain, dyspnea on exertion - no edema, PND, orthopnea - no nausea/vomiting; no tarry, melanotic or bloody stools - no dysuria, increased urinary frequency - no weight changes  Rest of systems reviewed are negative, except as per above history.      Past Medical History:  Diagnosis Date  . Diabetes mellitus 4/09   A1C-6  . Familial tremor   . Hyperlipidemia   . Hypothyroidism   . Osteoporosis   . Tachycardia    AV Re-entry, s/p ablation aprox 2005  . TIA (transient ischemic attack) 12/09   Past Surgical History:  Procedure Laterality Date  . ABDOMINAL HYSTERECTOMY     no oophorectomy  . BLADDER SURGERY     x 2 in the 80s  . BREAST BIOPSY     L (-)  . IR KYPHO EA ADDL LEVEL THORACIC OR LUMBAR  12/29/2019  . IR KYPHO THORACIC WITH BONE BIOPSY  12/29/2019    Social History:  reports that she quit smoking about 7 years ago. She has never used smokeless tobacco. She reports current alcohol use. She reports that she does not use drugs.  Allergies  Allergen Reactions  . Sulfonamide Derivatives Other (See Comments)    Unknown    Family History  Problem Relation Age of Onset  . Pancreatic cancer Brother   . Pancreatic cancer Father   . Coronary  artery disease Mother 55  . Breast cancer Neg Hx   . Colon cancer Neg Hx       Prior to Admission medications   Medication Sig Start Date End Date Taking? Authorizing Provider  aspirin 81 MG tablet Take 81 mg by mouth daily.     [provider]  Calcium-Vitamin D (CALTRATE 600 PLUS-VIT D PO) Take 1 tablet by mouth daily. 1 a day     [provider]  cyclobenzaprine (FLEXERIL) 10 MG tablet Take 0.5-1 tablets (5-10 mg total) by mouth 2 (two) times daily as needed for muscle spasms. 01/02/20    [name], MD  HYDROcodone-acetaminophen (NORCO/VICODIN) 5-325 MG tablet Take 1 tablet by mouth every 6 (six) hours as needed for up to 5 days for moderate pain. 12/31/19 01/05/20  Dorcas Carrow, MD  hydrocortisone 2.5 % cream Apply topically 2 (two) times daily. 12/15/19   [name], MD  levothyroxine (SYNTHROID) 88 MCG tablet Take 1 tablet (88 mcg total) by mouth daily before breakfast. 08/11/19   [name], MD  lidocaine (LIDODERM) 5 % Place 1 patch onto the skin daily for 6 days. Remove & Discard patch within 12 hours or as directed by MD 01/01/20 01/07/20  Dorcas Carrow, MD  Multiple Vitamin (MULITIVITAMIN WITH MINERALS) TABS Take 1 tablet by mouth daily.    [provider]  polyethylene glycol (MIRALAX / GLYCOLAX) 17 g packet Take 17 g by mouth daily as needed.    [provider]  senna (SENOKOT) 8.6 MG tablet Take 1 tablet by mouth daily as needed for constipation.     [provider]  TURMERIC PO Take 1 tablet by mouth daily.    [provider]    PHYSICAL EXAM   Temp:  [98.6 F (37 C)] 98.6 F (37 C) (02/12 1739) Pulse Rate:  [38-42] 40 (02/12 1834) Resp:  [15] 15 (02/12 1739) BP: (92-170)/(58-62) 154/58 (02/12 1834) SpO2:  [94 %-96 %] 94 % (02/12 1834) Weight:  [62.1 kg] 62.1 kg (02/12 1057)  BP (!) 154/58 (BP Location: Left Arm)   Pulse (!) 40   Temp 98.6 F (37 C) (Oral)   Resp 15   SpO2 94%    GEN thin elderly caucasian female; resting in bed comfortably  HEENT NCAT EOM intact PERRL; clear oropharynx, no cervical LAD; moist mucus membranes  JVP estimated 5 cm H2O above RA; no HJR ; no carotid bruits b/l ;  CV regular normal rate; normal S1 and S2; no m/r/g or S3/S4; PMI non displaced; no parasternal heave  RESP CTA b/l; breathing unlabored and and symmetric  ABD soft NT ND +normoactive BS  EXT warm throughout b/l; no peripheral edema b/l  PULSES  DP and radials intact b/l  SKIN/MSK no rashes or lesions  NEURO/PSYCH AAOx3; intact  motor 5/5 strength with plantar flexion/extension; knee flexion/extension. Hip flexion limited by pain. R posterior hip tender to moderately palpation. + lower back pain with leg raise R > L leg raise. L1-L2 spine mildly tender to palpation. Coccyx tender to palpation. No upper extremity deficits.   DATA   LABS ON ADMISSION:  Basic Metabolic Panel: No results for input(s): NA, K, CL, CO2, GLUCOSE, BUN, CREATININE, CALCIUM, MG, PHOS in the last 168 hours. CBC: Recent Labs  Lab 12/29/19 0941  WBC 6.4  NEUTROABS 4.0  HGB 14.4  HCT 43.5  MCV 91.8  PLT 290   Liver Function Tests: No results for input(s): AST, ALT, ALKPHOS, BILITOT,  PROT, ALBUMIN in the last 168 hours. No results for input(s): LIPASE, AMYLASE in the last 168 hours. No results for input(s): AMMONIA in the last 168 hours. Coagulation:  Lab Results  Component Value Date   INR 1.0 12/26/2019   INR 0.9 11/18/2008   No results found for: PTT Lactic Acid, Venous:  No results found for: LATICACIDVEN Cardiac Enzymes: No results for input(s): CKTOTAL, CKMB, CKMBINDEX, TROPONINI in the last 168 hours. Urinalysis:    Component Value Date/Time   COLORURINE YELLOW 12/26/2019 0926   APPEARANCEUR CLEAR 12/26/2019 0926   LABSPEC 1.011 12/26/2019 0926   PHURINE 6.0 12/26/2019 0926   GLUCOSEU NEGATIVE 12/26/2019 0926   GLUCOSEU NEGATIVE 10/13/2014 1615   HGBUR NEGATIVE 12/26/2019 0926   BILIRUBINUR NEGATIVE 12/26/2019 0926   KETONESUR NEGATIVE 12/26/2019 0926   PROTEINUR NEGATIVE 12/26/2019 0926   UROBILINOGEN 0.2 10/13/2014 1615   NITRITE NEGATIVE 12/26/2019 0926   LEUKOCYTESUR SMALL (A) 12/26/2019 0926    BNP (last 3 results) No results for input(s): PROBNP in the last 8760 hours. CBG: No results for input(s): GLUCAP in the last 168 hours.  Radiological Exams on Admission: No results found.  EKG: not ordered  I have reviewed the patient's previous electronic chart records, labs, and other data.   ASSESSMENT  AND PLAN   Assessment: Amy Weiss is a 85 y.o. female with hx of DMT2 (diet controlled), hypothyroidism, forgetfulness (?mild dementia per daughter), TIA who presents with acute lower back pain after a mechanical fall onto her coccyx s/p recent kyphoplasty at T12 and L1 for compression fractures. Plan to re-image spine with lumbar MRI non contrast and pending MRI will discuss with neurosurgery.   Active Problems:   Back pain  Plan:   # Acute back pain with hx of compression fractures and recent kyphoplasty T12 and L1 after mechanical fall onto coccyx on 2/11 > concern for new compression fractures due to fall; no neuro deficit on exam 01/02/20 evening - lumbar MRI spine wo contrast ordered - if patient cannot tolerate MRI, will do plain film vs CT instead - pending results will consider consulting neurosurg - pain control with prn vicodin 5-325mg  q6h prn - flexeril 5-10mg  bid prn spasms - hold off on PT until imaging cleared   # Constipation in setting of opioids for back pain - scheduled senokot and prn miralax    # Chronic hypothyroidism - resume home synthroid   # Osteoporosis, suspected given compression fx - starting high dose vitamin D q7 - continue outpatient management of osteoporosis  # Hx of HLD   - continue home aspirin 81mg  daily    DVT Prophylaxis: lovenox Code Status:  DNR Family Communication: communicated with daughter at bedside Disposition Plan: admit as observation; further workup pending back MRI  Patient contact: Extended Emergency Contact Information Primary Emergency Contact: Baker,[name] Address: [place],  [address] [place] Phone: [phone] Mobile Phone: [phone] Relation: Daughter  Time spent: > 35 mins  Colbert Ewing, MD [facility] Pager [phone]  If 7PM-7AM, please contact night-coverage [email or web address] Password [facility] 01/02/2020, 7:06 PM

## 2020-01-02 NOTE — Progress Notes (Signed)
Subjective:    Patient ID: Amy Weiss, female    DOB: [birth year], 85 y.o.   MRN: [id number]  DOS:  01/02/2020 Type of visit - description: Virtual Visit via Telephone  Attempted  to make this a video visit, due to technical difficulties from the patient side it was not possible  thus we proceeded with a Virtual Visit via Telephone    I connected with above mentioned patient  by telephone and verified that I am speaking with the correct person using two identifiers.  THIS ENCOUNTER IS A VIRTUAL VISIT DUE TO COVID-19 - PATIENT WAS NOT SEEN IN THE OFFICE. PATIENT HAS CONSENTED TO VIRTUAL VISIT / TELEMEDICINE VISIT   Location of patient: home  Location of provider: office  I discussed the limitations, risks, security and privacy concerns of performing an evaluation and management service by telephone and the availability of in person appointments. I also discussed with the patient that there may be a patient responsible charge related to this service. The patient expressed understanding and agreed to proceed.  Virtual visit, telephone: Today I communicate with the patient, [name] and another family member named Amy Weiss who is a Advice worker.  She was discharged home yesterday, she was able to walk assisted by a walker. At home she took a shower and subsequently tried to go to the living room, there was a step down and in the process of getting down she fell backwards and landed on her back. Immediately developed a intense right-sided low back pain with no radiation. Denies any head injury, no LOC, no bladder or bowel incontinence, no paresthesias.    Review of Systems No CP SOB, no palpitations   Past Medical History:  Diagnosis Date  . Diabetes mellitus 4/09   A1C-6  . Familial tremor   . Hyperlipidemia   . Hypothyroidism   . Osteoporosis   . Tachycardia    AV Re-entry, s/p ablation aprox 2005  . TIA (transient ischemic attack) 12/09    Past  Surgical History:  Procedure Laterality Date  . ABDOMINAL HYSTERECTOMY     no oophorectomy  . BLADDER SURGERY     x 2 in the 80s  . BREAST BIOPSY     L (-)  . IR KYPHO EA ADDL LEVEL THORACIC OR LUMBAR  12/29/2019  . IR KYPHO THORACIC WITH BONE BIOPSY  12/29/2019    Allergies as of 01/02/2020      Reactions   Sulfonamide Derivatives Other (See Comments)   Skin irritation      Medication List       Accurate as of January 02, 2020  5:12 PM. If you have any questions, ask your nurse or doctor.        aspirin 81 MG tablet Take 81 mg by mouth daily.   CALTRATE 600 PLUS-VIT D PO Take 1 tablet by mouth daily. 1 a day   cyclobenzaprine 10 MG tablet Commonly known as: FLEXERIL Take 5 mg by mouth 2 (two) times daily as needed for muscle spasms.   HYDROcodone-acetaminophen 5-325 MG tablet Commonly known as: NORCO/VICODIN Take 1 tablet by mouth every 6 (six) hours as needed for up to 5 days for moderate pain.   hydrocortisone 2.5 % cream Apply topically 2 (two) times daily.   levothyroxine 88 MCG tablet Commonly known as: SYNTHROID Take 1 tablet (88 mcg total) by mouth daily before breakfast.   lidocaine 5 % Commonly known as: LIDODERM Place 1 patch onto the skin daily for  6 days. Remove & Discard patch within 12 hours or as directed by MD   multivitamin with minerals Tabs tablet Take 1 tablet by mouth daily.   polyethylene glycol 17 g packet Commonly known as: MIRALAX / GLYCOLAX Take 17 g by mouth daily as needed (constipation).   senna 8.6 MG tablet Commonly known as: SENOKOT Take 1 tablet by mouth daily as needed for constipation.   TURMERIC PO Take 1 tablet by mouth daily.             Objective:   Physical Exam BP 92/60   Pulse (!) 42   Ht 5\' 7"  (1.702 m)   Wt 137 lb (62.1 kg)   SpO2 96%   BMI 21.46 kg/m  This is telephone virtual visit, she is alert oriented x2 (not oriented in time)    Assessment      Assessment > Prediabetes   dx 2009 >>>   A1c 6.0 Hypothyroidism TIA 2009 DJD  Osteoporosis per dexa 2008, consistently declined retesting Familial tremor CV: [facility] cardiology [facility] ---AV reentry tachycardia, ablation 2005 ---Bradycardic , Mobitz I , junctional escapes -- saw cards @ [facility], f/u 1 year, pt decided not to go back  PLAN Back pain Since the last office visit, she was admitted to hospital, had two vertebral kyphoplasties. Was released home yesterday, had a fall at home, the pain resurface and is severe. The patient is again essentially bedridden. I had a very long conversation with the patient, her daughter and another family member Amy Weiss who is a Librarian, academic. Multiple options were entertained in the context of the patient being adamant about not going to the ER. Eventually we agreed to try at direct admission noting that other than the severe pain she is not having other sxs such as chest pain, difficulty breathing, palpitations, headache, neck pain. Her vital signs are stable except for the blood pressure which is in the low side however family report that is not an uncommon finding on her.  Mental status is at baseline I contacted Dr. Roosevelt Locks, explained the situation and we agreed on a direct admission. The patient and her family verbalized understanding. They will be contacted directly when a bed is available and they plan to call an ambulance for transportation. MCI: The family reports that when she was in the hospital she was very confused, likely sundowning, now that she is at home she is much improved. Anticipate that can be an issue when she is again in the hospital.  I discussed the assessment and treatment plan with the patient. The patient was provided an opportunity to ask questions and all were answered. The patient agreed with the plan and demonstrated an understanding of the instructions.   The patient was advised to call back or seek an in-person evaluation if the symptoms worsen or  if the condition fails to improve as anticipated.  I provided > 30 minutes of non-face-to-face time during this encounter.  Kathlene November, MD

## 2020-01-02 NOTE — Telephone Encounter (Signed)
Noted, agree with plan.

## 2020-01-02 NOTE — Telephone Encounter (Signed)
Discussed w/ Dr. Drue Novel- informed I've spoken w/ [name] today for at least 15 minutes. Per Dr. Drue Novel he will call during lunch-- add to schedule.

## 2020-01-03 ENCOUNTER — Encounter (HOSPITAL_COMMUNITY): Payer: Self-pay | Admitting: Internal Medicine

## 2020-01-03 ENCOUNTER — Observation Stay (HOSPITAL_COMMUNITY): Payer: Medicare HMO

## 2020-01-03 ENCOUNTER — Inpatient Hospital Stay (HOSPITAL_COMMUNITY): Payer: Medicare HMO

## 2020-01-03 DIAGNOSIS — M4856XA Collapsed vertebra, not elsewhere classified, lumbar region, initial encounter for fracture: Secondary | ICD-10-CM | POA: Diagnosis not present

## 2020-01-03 DIAGNOSIS — E785 Hyperlipidemia, unspecified: Secondary | ICD-10-CM | POA: Diagnosis present

## 2020-01-03 DIAGNOSIS — S32000D Wedge compression fracture of unspecified lumbar vertebra, subsequent encounter for fracture with routine healing: Secondary | ICD-10-CM | POA: Diagnosis not present

## 2020-01-03 DIAGNOSIS — Z79890 Hormone replacement therapy (postmenopausal): Secondary | ICD-10-CM | POA: Diagnosis not present

## 2020-01-03 DIAGNOSIS — M6281 Muscle weakness (generalized): Secondary | ICD-10-CM | POA: Diagnosis not present

## 2020-01-03 DIAGNOSIS — G8929 Other chronic pain: Secondary | ICD-10-CM | POA: Diagnosis not present

## 2020-01-03 DIAGNOSIS — R627 Adult failure to thrive: Secondary | ICD-10-CM | POA: Diagnosis not present

## 2020-01-03 DIAGNOSIS — M2550 Pain in unspecified joint: Secondary | ICD-10-CM | POA: Diagnosis not present

## 2020-01-03 DIAGNOSIS — R52 Pain, unspecified: Secondary | ICD-10-CM | POA: Diagnosis not present

## 2020-01-03 DIAGNOSIS — Z9181 History of falling: Secondary | ICD-10-CM | POA: Diagnosis not present

## 2020-01-03 DIAGNOSIS — E039 Hypothyroidism, unspecified: Secondary | ICD-10-CM | POA: Diagnosis not present

## 2020-01-03 DIAGNOSIS — R4182 Altered mental status, unspecified: Secondary | ICD-10-CM | POA: Diagnosis not present

## 2020-01-03 DIAGNOSIS — I959 Hypotension, unspecified: Secondary | ICD-10-CM | POA: Diagnosis not present

## 2020-01-03 DIAGNOSIS — Z20822 Contact with and (suspected) exposure to covid-19: Secondary | ICD-10-CM | POA: Diagnosis not present

## 2020-01-03 DIAGNOSIS — M549 Dorsalgia, unspecified: Secondary | ICD-10-CM | POA: Diagnosis not present

## 2020-01-03 DIAGNOSIS — Z515 Encounter for palliative care: Secondary | ICD-10-CM | POA: Diagnosis not present

## 2020-01-03 DIAGNOSIS — R69 Illness, unspecified: Secondary | ICD-10-CM | POA: Diagnosis not present

## 2020-01-03 DIAGNOSIS — S32020D Wedge compression fracture of second lumbar vertebra, subsequent encounter for fracture with routine healing: Secondary | ICD-10-CM | POA: Diagnosis not present

## 2020-01-03 DIAGNOSIS — R296 Repeated falls: Secondary | ICD-10-CM | POA: Diagnosis not present

## 2020-01-03 DIAGNOSIS — M25551 Pain in right hip: Secondary | ICD-10-CM | POA: Diagnosis not present

## 2020-01-03 DIAGNOSIS — M810 Age-related osteoporosis without current pathological fracture: Secondary | ICD-10-CM | POA: Diagnosis not present

## 2020-01-03 DIAGNOSIS — Z90710 Acquired absence of both cervix and uterus: Secondary | ICD-10-CM | POA: Diagnosis not present

## 2020-01-03 DIAGNOSIS — I361 Nonrheumatic tricuspid (valve) insufficiency: Secondary | ICD-10-CM | POA: Diagnosis not present

## 2020-01-03 DIAGNOSIS — T402X5A Adverse effect of other opioids, initial encounter: Secondary | ICD-10-CM | POA: Diagnosis present

## 2020-01-03 DIAGNOSIS — E7849 Other hyperlipidemia: Secondary | ICD-10-CM | POA: Diagnosis not present

## 2020-01-03 DIAGNOSIS — R7303 Prediabetes: Secondary | ICD-10-CM | POA: Diagnosis not present

## 2020-01-03 DIAGNOSIS — R531 Weakness: Secondary | ICD-10-CM | POA: Diagnosis not present

## 2020-01-03 DIAGNOSIS — M545 Low back pain: Secondary | ICD-10-CM | POA: Diagnosis not present

## 2020-01-03 DIAGNOSIS — F0390 Unspecified dementia without behavioral disturbance: Secondary | ICD-10-CM | POA: Diagnosis present

## 2020-01-03 DIAGNOSIS — Z8249 Family history of ischemic heart disease and other diseases of the circulatory system: Secondary | ICD-10-CM | POA: Diagnosis not present

## 2020-01-03 DIAGNOSIS — R4189 Other symptoms and signs involving cognitive functions and awareness: Secondary | ICD-10-CM | POA: Diagnosis not present

## 2020-01-03 DIAGNOSIS — G250 Essential tremor: Secondary | ICD-10-CM | POA: Diagnosis present

## 2020-01-03 DIAGNOSIS — I350 Nonrheumatic aortic (valve) stenosis: Secondary | ICD-10-CM | POA: Diagnosis not present

## 2020-01-03 DIAGNOSIS — I340 Nonrheumatic mitral (valve) insufficiency: Secondary | ICD-10-CM | POA: Diagnosis not present

## 2020-01-03 DIAGNOSIS — K5903 Drug induced constipation: Secondary | ICD-10-CM | POA: Diagnosis present

## 2020-01-03 DIAGNOSIS — E568 Deficiency of other vitamins: Secondary | ICD-10-CM | POA: Diagnosis not present

## 2020-01-03 DIAGNOSIS — R001 Bradycardia, unspecified: Secondary | ICD-10-CM | POA: Diagnosis not present

## 2020-01-03 DIAGNOSIS — S32020A Wedge compression fracture of second lumbar vertebra, initial encounter for closed fracture: Secondary | ICD-10-CM | POA: Diagnosis not present

## 2020-01-03 DIAGNOSIS — S22080D Wedge compression fracture of T11-T12 vertebra, subsequent encounter for fracture with routine healing: Secondary | ICD-10-CM | POA: Diagnosis not present

## 2020-01-03 DIAGNOSIS — W1830XA Fall on same level, unspecified, initial encounter: Secondary | ICD-10-CM | POA: Diagnosis not present

## 2020-01-03 DIAGNOSIS — Z7401 Bed confinement status: Secondary | ICD-10-CM | POA: Diagnosis not present

## 2020-01-03 DIAGNOSIS — S79911A Unspecified injury of right hip, initial encounter: Secondary | ICD-10-CM | POA: Diagnosis not present

## 2020-01-03 DIAGNOSIS — Z8673 Personal history of transient ischemic attack (TIA), and cerebral infarction without residual deficits: Secondary | ICD-10-CM | POA: Diagnosis not present

## 2020-01-03 DIAGNOSIS — Z800 Family history of malignant neoplasm of digestive organs: Secondary | ICD-10-CM | POA: Diagnosis not present

## 2020-01-03 DIAGNOSIS — S32000A Wedge compression fracture of unspecified lumbar vertebra, initial encounter for closed fracture: Secondary | ICD-10-CM | POA: Diagnosis not present

## 2020-01-03 DIAGNOSIS — E162 Hypoglycemia, unspecified: Secondary | ICD-10-CM | POA: Diagnosis not present

## 2020-01-03 DIAGNOSIS — Z87891 Personal history of nicotine dependence: Secondary | ICD-10-CM | POA: Diagnosis not present

## 2020-01-03 DIAGNOSIS — S32010D Wedge compression fracture of first lumbar vertebra, subsequent encounter for fracture with routine healing: Secondary | ICD-10-CM | POA: Diagnosis not present

## 2020-01-03 DIAGNOSIS — W1830XD Fall on same level, unspecified, subsequent encounter: Secondary | ICD-10-CM | POA: Diagnosis not present

## 2020-01-03 DIAGNOSIS — Z66 Do not resuscitate: Secondary | ICD-10-CM | POA: Diagnosis not present

## 2020-01-03 LAB — BASIC METABOLIC PANEL
Anion gap: 8 (ref 5–15)
BUN: 11 mg/dL (ref 8–23)
CO2: 23 mmol/L (ref 22–32)
Calcium: 8.9 mg/dL (ref 8.9–10.3)
Chloride: 107 mmol/L (ref 98–111)
Creatinine, Ser: 0.58 mg/dL (ref 0.44–1.00)
GFR calc Af Amer: >60 mL/min (ref >60)
GFR calc non Af Amer: >60 mL/min (ref >60)
Glucose, Bld: 109 mg/dL — ABNORMAL HIGH (ref 70–99)
Potassium: 4.2 mmol/L (ref 3.5–5.1)
Sodium: 138 mmol/L (ref 135–145)

## 2020-01-03 LAB — ECHOCARDIOGRAM COMPLETE
Height: 67 in
Weight: 2192.01 oz

## 2020-01-03 LAB — CBC
HCT: 41.9 % (ref 36.0–46.0)
Hemoglobin: 14 g/dL (ref 12.0–15.0)
MCH: 30.1 pg (ref 26.0–34.0)
MCHC: 33.4 g/dL (ref 30.0–36.0)
MCV: 90.1 fL (ref 80.0–100.0)
Platelets: 257 10*3/uL (ref 150–400)
RBC: 4.65 MIL/uL (ref 3.87–5.11)
RDW: 15 % (ref 11.5–15.5)
WBC: 6.1 10*3/uL (ref 4.0–10.5)
nRBC: 0 % (ref 0.0–0.2)

## 2020-01-03 LAB — SARS CORONAVIRUS 2 (TAT 6-24 HRS): SARS Coronavirus 2: NEGATIVE

## 2020-01-03 LAB — MAGNESIUM: Magnesium: 2 mg/dL (ref 1.7–2.4)

## 2020-01-03 MED ORDER — SODIUM CHLORIDE 0.9 % IV SOLN: INTRAVENOUS | Status: AC

## 2020-01-03 NOTE — Progress Notes (Signed)
Notified Dr Rana Snare that daughter is asking for an xray of the Rt hip due to pain from fall. TSH lab , and IV fluids since pt is not eating or drinking well. RN will continue to monitor the patient's status.

## 2020-01-03 NOTE — Progress Notes (Signed)
  Echocardiogram 2D Echocardiogram has been performed.  Amy Weiss 01/03/2020, 4:28 PM

## 2020-01-03 NOTE — Progress Notes (Signed)
PROGRESS NOTE    Amy Weiss  HER:[id number] DOB: [birth year] DOA: 01/02/2020 PCP: [name], MD  Outpatient Specialists:    Brief Narrative: As per acute care H&P done by Dr. Toniann Fail on admission "Amy Weiss is a 85 y.o. female with hx of DMT2 (diet controlled), hypothyroidism, forgetfulness (?mild dementia per daughter), TIA who presents with acute lower back pain after a mechanical fall onto her coccyx s/p recent kyphoplasty (on 2/8) at T12 and L1 for compression fractures. Patient is alert and oriented, but states that she does not remember details of the day prior, so daughter (caretaker) provided the history at bedside.   Per daughter, patient had been recovering well since her kyphoplasty last week by IR and was able to ambulate using a walker. Needing only 2-3 tabs of vicodin 5-325mg  per day. Yesterday morning, while ambulating with a walker, patient was navigating one step in the living room and then pulled back (extending her back) because she thought she was falling forward and likely overcorrected -- she fell onto her lower back/coccyx region as witnessed by her daughter, who was actually standing in front of her (ahead of the step). After the fall, patient reported excruciating lower back pain, similar to the pain she had prior to the kyphoplasty and was unable to move without assistance.   Patient contacted her PCP who recommended that patient be evaluated at the ED. Patient and family reportedly refused to come to the ED because of concern for long wait times and thus PCP requested direct admission for further workup.   Patient reports no issues with urinary incontinence or emptying her bladder. She does report constipation x 3-4 days. Takes prn miralax and senna. On aspirin 81mg  for risk reduction; not on anticoagulation.   Otherwise had been in her usual state of health. Daughter reports that since December in the setting of her back issues, the patient seems to be  increasingly forgetful -- concerned about mild dementia".   01/03/2020: Patient seen alongside patient's daughter.  MRI of the lumbar spine revealed "Good appearance at the previously augmented levels of T12 and L1. No further collapse or unexpected finding.  Newly seen superior endplate fracture at L2 with loss of height of only about 10%. No retropulsed bone. This level was normal previously".  Patient tells me that the pain has improved significantly.  Patient's daughter suggests that patient history may not be trusted.  Consulted interventional radiology and neurosurgical team.  Discussed with Dr. 01/05/2020, neurosurgery team, and he advised letting interventional radiology team direct the care of the L2 fracture.  Assessment & Plan:   Active Problems:   Back pain  Acute back pain/superior endplate fracture at L2 with 10% loss of height: -History of compression fractures and recent kyphoplasty T12 and L1 after mechanical fall onto coccyx on 2/11 -Lumbar MRI result is as documented above.  -Discussed case with neurosurgery as documented above. -I have consulted interventional radiology. -Adequate pain control. -Further management will depend on hospital course.  Constipation in setting of opioids for back pain: - scheduled senokot and prn miralax   Chronic hypothyroidism: - resumed home synthroid  Osteoporosis, suspected given compression fracture: - Continue high dose vitamin D q7 - continue outpatient management of osteoporosis  History of Hyperlipidemia:  - continue home aspirin 81mg  daily    DVT prophylaxis: Subacute Lovenox Code Status: DNR Family Communication: Daughter was at the bedside Disposition Plan: This will depend on the hospital course.  Interventional radiology team, hopefully, will  see patient in consultation.   Consultants:   Interventional radiology  Neurosurgery (discussed with Dr. Arnoldo Morale).  Please see above.  Procedures:   None  Antimicrobials:     None   Subjective: Back pain is improving according to patient. No fever or chills  Objective: Vitals:   01/02/20 1739 01/02/20 1834 01/02/20 1937 01/03/20 0636  BP: (!) 170/62 (!) 154/58 (!) 154/59 (!) 148/62  Pulse: (!) 38 (!) 40 64 65  Resp: 15  15 16   Temp: 98.6 F (37 C)  98.1 F (36.7 C) 98.9 F (37.2 C)  TempSrc: Oral  Oral Oral  SpO2: 96% 94% 96%     Intake/Output Summary (Last 24 hours) at 01/03/2020 1222 Last data filed at 01/03/2020 0300 Gross per 24 hour  Intake --  Output 400 ml  Net -400 ml   There were no vitals filed for this visit.  Examination:  General exam: Appears calm and comfortable. Respiratory system: Clear to auscultation. Respiratory effort normal. Cardiovascular system: S1 & S2 with ejection systolic murmur. No pedal edema. Gastrointestinal system: Abdomen is nondistended, soft and nontender. No organomegaly or masses felt. Normal bowel sounds heard. Central nervous system: Awake and alert.  Moves all extremities.   Extremities: No leg edema  Data Reviewed: I have personally reviewed following labs and imaging studies  CBC: Recent Labs  Lab 12/29/19 0941 01/02/20 1931 01/03/20 0239  WBC 6.4 7.1 6.1  NEUTROABS 4.0  --   --   HGB 14.4 13.6 14.0  HCT 43.5 41.6 41.9  MCV 91.8 91.4 90.1  PLT 290 271 211   Basic Metabolic Panel: Recent Labs  Lab 01/02/20 1931 01/03/20 0239  NA  --  138  K  --  4.2  CL  --  107  CO2  --  23  GLUCOSE  --  109*  BUN  --  11  CREATININE 0.67 0.58  CALCIUM  --  8.9  MG  --  2.0   GFR: Estimated Creatinine Clearance: 52.7 mL/min (by C-G formula based on SCr of 0.58 mg/dL). Liver Function Tests: No results for input(s): AST, ALT, ALKPHOS, BILITOT, PROT, ALBUMIN in the last 168 hours. No results for input(s): LIPASE, AMYLASE in the last 168 hours. No results for input(s): AMMONIA in the last 168 hours. Coagulation Profile: No results for input(s): INR, PROTIME in the last 168 hours. Cardiac  Enzymes: No results for input(s): CKTOTAL, CKMB, CKMBINDEX, TROPONINI in the last 168 hours. BNP (last 3 results) No results for input(s): PROBNP in the last 8760 hours. HbA1C: No results for input(s): HGBA1C in the last 72 hours. CBG: No results for input(s): GLUCAP in the last 168 hours. Lipid Profile: No results for input(s): CHOL, HDL, LDLCALC, TRIG, CHOLHDL, LDLDIRECT in the last 72 hours. Thyroid Function Tests: No results for input(s): TSH, T4TOTAL, FREET4, T3FREE, THYROIDAB in the last 72 hours. Anemia Panel: No results for input(s): VITAMINB12, FOLATE, FERRITIN, TIBC, IRON, RETICCTPCT in the last 72 hours. Urine analysis:    Component Value Date/Time   COLORURINE YELLOW 12/26/2019 0926   APPEARANCEUR CLEAR 12/26/2019 0926   LABSPEC 1.011 12/26/2019 0926   PHURINE 6.0 12/26/2019 0926   GLUCOSEU NEGATIVE 12/26/2019 0926   GLUCOSEU NEGATIVE 10/13/2014 1615   HGBUR NEGATIVE 12/26/2019 0926   BILIRUBINUR NEGATIVE 12/26/2019 0926   KETONESUR NEGATIVE 12/26/2019 0926   PROTEINUR NEGATIVE 12/26/2019 0926   UROBILINOGEN 0.2 10/13/2014 1615   NITRITE NEGATIVE 12/26/2019 0926   LEUKOCYTESUR SMALL (A) 12/26/2019 0926   Sepsis  Labs: @LABRCNTIP (procalcitonin:4,lacticidven:4)  ) Recent Results (from the past 240 hour(s))  Urine Culture     Status: Abnormal   Collection Time: 12/26/19  9:31 AM   Specimen: Urine, Clean Catch  Result Value Ref Range Status   Specimen Description URINE, CLEAN CATCH  Final   Special Requests   Final    NONE Performed at [facility], [address] [place], [place]    Culture 40,000 COLONIES/mL STAPHYLOCOCCUS LUGDUNENSIS (A)  Final   Report Status 12/28/2019 FINAL  Final   Organism ID, Bacteria STAPHYLOCOCCUS LUGDUNENSIS (A)  Final      Susceptibility   Staphylococcus lugdunensis - MIC*    CIPROFLOXACIN <=0.5 SENSITIVE Sensitive     GENTAMICIN <=0.5 SENSITIVE Sensitive     NITROFURANTOIN <=16 SENSITIVE Sensitive      OXACILLIN 2 SENSITIVE Sensitive     TETRACYCLINE <=1 SENSITIVE Sensitive     VANCOMYCIN <=0.5 SENSITIVE Sensitive     TRIMETH/SULFA <=10 SENSITIVE Sensitive     CLINDAMYCIN <=0.25 SENSITIVE Sensitive     RIFAMPIN <=0.5 SENSITIVE Sensitive     Inducible Clindamycin NEGATIVE Sensitive     * 40,000 COLONIES/mL STAPHYLOCOCCUS LUGDUNENSIS         Radiology Studies: MR LUMBAR SPINE WO CONTRAST  Result Date: 01/03/2020 CLINICAL DATA:  Worsening back pain. Previous T12 and L1 kyphoplasty. Subsequent fall. EXAM: MRI LUMBAR SPINE WITHOUT CONTRAST TECHNIQUE: Multiplanar, multisequence MR imaging of the lumbar spine was performed. No intravenous contrast was administered. COMPARISON:  12/25/2019 FINDINGS: Segmentation:  5 lumbar type vertebral bodies. Alignment:  Mild straightening of the spine. Vertebrae: Previously augmented compression fractures at T12 and L1 do not show any additional loss of height or unexpected finding. There is a newly seen fracture at L2 at the superior endplate with loss of height of 10%. No retropulsed. This level was normal previously. Conus medullaris and cauda equina: Conus extends to the L1 level. Conus and cauda equina appear normal. Paraspinal and other soft tissues: Negative Disc levels: Mild posterior bowing of the posterosuperior margins of T12 and L1 but no compressive stenosis. No disc pathology from L1-2 through L3-4. L4-5 shows chronic disc degeneration with mild bulging of the disc but no compressive stenosis. L5-S1 shows disc degeneration with minimal bulging of the disc but no stenosis. IMPRESSION: Good appearance at the previously augmented levels of T12 and L1. No further collapse or unexpected finding. Newly seen superior endplate fracture at L2 with loss of height of only about 10%. No retropulsed bone. This level was normal previously. Electronically Signed   By: 02/22/2020 M.D.   On: 01/03/2020 02:51        Scheduled Meds: . aspirin EC  81 mg Oral Daily   . enoxaparin (LOVENOX) injection  40 mg Subcutaneous Q24H  . levothyroxine  88 mcg Oral QAC breakfast  . lidocaine  1 patch Transdermal Q24H  . multivitamin with minerals  1 tablet Oral Daily  . senna-docusate  2 tablet Oral QHS  . Vitamin D (Ergocalciferol)  50,000 Units Oral Q7 days   Continuous Infusions:   LOS: 1 day    Time spent: 25 Minutes.01/05/2020, MD  [facility] Pager #: [phone] 7PM-7AM contact night coverage as above

## 2020-01-03 NOTE — Progress Notes (Signed)
IR aware of request for L2 KP/VP -- will tentatively plan on procedure Monday 2/15 pending NIR MD review on Monday morning. I have placed orders for patient to be NPO at midnight on 2/15 as well as AM labs.  NIR will follow up with patient on Monday for consult/consent if plans are to proceed.  Please call IR with any questions or concerns.  Lynnette Caffey, PA-C

## 2020-01-03 NOTE — Assessment & Plan Note (Signed)
Back pain Since the last office visit, she was admitted to hospital, had two vertebral kyphoplasties. Was released home yesterday, had a fall at home, the pain resurface and is severe. The patient is again essentially bedridden. I had a very long conversation with the patient, her daughter and another family member [name] who is a Advice worker. Multiple options were entertained in the context of the patient being adamant about not going to the ER. Eventually we agreed to try at direct admission noting that other than the severe pain she is not having other sxs such as chest pain, difficulty breathing, palpitations, headache, neck pain. Her vital signs are stable except for the blood pressure which is in the low side however family report that is not an uncommon finding on her.  Mental status is at baseline I contacted Dr. Chipper Herb, explained the situation and we agreed on a direct admission. The patient and her family verbalized understanding. They will be contacted directly when a bed is available and they plan to call an ambulance for transportation. MCI: The family reports that when she was in the hospital she was very confused, likely sundowning, now that she is at home she is much improved. Anticipate that can be an issue when she is again in the hospital.  I discussed the assessment and treatment plan with the patient. The patient was provided an opportunity to ask questions and all were answered. The patient agreed with the plan and demonstrated an understanding of the instructions.

## 2020-01-03 NOTE — Plan of Care (Signed)
  Problem: Education: Goal: Ability to verbalize activity precautions or restrictions will improve Outcome: Progressing   Problem: Activity: Goal: Ability to avoid complications of mobility impairment will improve Outcome: Progressing   Problem: Clinical Measurements: Goal: Ability to maintain clinical measurements within normal limits will improve Outcome: Progressing   Problem: Pain Management: Goal: Pain level will decrease Outcome: Progressing

## 2020-01-04 ENCOUNTER — Inpatient Hospital Stay (HOSPITAL_COMMUNITY): Payer: Medicare HMO

## 2020-01-04 LAB — CBC
HCT: 40.6 % (ref 36.0–46.0)
Hemoglobin: 13.4 g/dL (ref 12.0–15.0)
MCH: 30.2 pg (ref 26.0–34.0)
MCHC: 33 g/dL (ref 30.0–36.0)
MCV: 91.6 fL (ref 80.0–100.0)
Platelets: 246 10*3/uL (ref 150–400)
RBC: 4.43 MIL/uL (ref 3.87–5.11)
RDW: 15.1 % (ref 11.5–15.5)
WBC: 6.8 10*3/uL (ref 4.0–10.5)
nRBC: 0 % (ref 0.0–0.2)

## 2020-01-04 LAB — BASIC METABOLIC PANEL
Anion gap: 8 (ref 5–15)
BUN: 11 mg/dL (ref 8–23)
CO2: 23 mmol/L (ref 22–32)
Calcium: 8.6 mg/dL — ABNORMAL LOW (ref 8.9–10.3)
Chloride: 107 mmol/L (ref 98–111)
Creatinine, Ser: 0.74 mg/dL (ref 0.44–1.00)
GFR calc Af Amer: >60 mL/min (ref >60)
GFR calc non Af Amer: >60 mL/min (ref >60)
Glucose, Bld: 101 mg/dL — ABNORMAL HIGH (ref 70–99)
Potassium: 4.4 mmol/L (ref 3.5–5.1)
Sodium: 138 mmol/L (ref 135–145)

## 2020-01-04 LAB — MAGNESIUM: Magnesium: 2 mg/dL (ref 1.7–2.4)

## 2020-01-04 LAB — TSH: TSH: 2.139 u[IU]/mL (ref 0.350–4.500)

## 2020-01-04 NOTE — Plan of Care (Signed)
  Problem: Activity: Goal: Ability to avoid complications of mobility impairment will improve Outcome: Progressing   Problem: Pain Management: Goal: Pain level will decrease Outcome: Progressing   Problem: Skin Integrity: Goal: Will show signs of wound healing Outcome: Progressing   

## 2020-01-04 NOTE — Progress Notes (Signed)
PROGRESS NOTE    Amy Weiss  XQJ:[id number] DOB: 1935-10-18 DOA: 01/02/2020 PCP: [name], MD  Outpatient Specialists:    Brief Narrative: Patient is an 85 year old Caucasian female with past medical history significant for diabetes mellitus type 2-diet controlled, hypothyroidism, forgetfulness (?mild dementia per daughter) and TIA.  Patient is s/p recent kyphoplasty (on 2/8) at T12 and L1 for compression fractures.  Patient presented with acute lower back pain following a mechanical fall.  Patient contacted her PCP who recommended that patient be evaluated at the ED. Patient and family reportedly refused to come to the ED because of concern for long wait times and thus PCP requested direct admission for further workup.  MRI of the lumbar spine revealed "Good appearance at the previously augmented levels of T12 and L1. No further collapse or unexpected finding.  Newly seen superior endplate fracture at L2 with loss of height of only about 10%. No retropulsed bone. This level was normal previously".  X-ray of the hip is nonrevealing.  Neurosurgery and interventional radiology teams have been consulted.  Discussed with the neurosurgical team, Dr. Lovell Sheehan.  Dr. Lovell Sheehan advised letting interventional radiology team direct the care of the L2 fracture.  Hopefully, patient will be seen by the interventional radiology team tomorrow to assess if there is need for kyphoplasty.  Consider PT OT consult after interventional radiology input.  Patient's back pain has improved significantly.     Assessment & Plan:   Active Problems:   Back pain  Acute back pain/superior endplate fracture at L2 with 10% loss of height: -History of compression fractures and recent kyphoplasty T12 and L1 after mechanical fall.  -Patient had another fall on discharge resulting in superior endplate fracture at L2 with 10% loss of height. -Lumbar MRI result is as documented above.  -Discussed case with neurosurgery as  documented above. -I have consulted interventional radiology.  Hopefully, interventional radiology team will see patient tomorrow.  Will assess patient for need for repeat kyphoplasty.  Based on interventional radiology recommendations, consider consulting physical therapy and Occupational Therapy. -Adequate pain control. -Further management will depend on hospital course.  Ejection systolic murmur: Echocardiogram revealed mild aortic stenosis.  Constipation in setting of opioids for back pain: - scheduled senokot and prn miralax   Chronic hypothyroidism: - resumed home synthroid -TSH is within normal limits.  Osteoporosis, suspected given compression fracture: - Continue high dose vitamin D q7 - continue outpatient management of osteoporosis  History of Hyperlipidemia:  - continue home aspirin 81mg  daily    DVT prophylaxis: Subacute Lovenox Code Status: DNR Family Communication: Daughter was at the bedside Disposition Plan: This will depend on the hospital course.  Interventional radiology team, hopefully, will see patient in consultation.   Consultants:   Interventional radiology  Neurosurgery (discussed with Dr. ).  Please see above.  Procedures:   None  Antimicrobials:   None   Subjective: Back pain has improved significantly.  No fever or chills  Objective: Vitals:   01/03/20 1408 01/03/20 1513 01/03/20 1950 01/04/20 0828  BP: (!) 148/62 (!) 147/61 (!) 142/58 118/63  Pulse: 65 (!) 45 (!) 51 96  Resp: 16 17 18 18   Temp: 98.9 F (37.2 C) 98.5 F (36.9 C) 97.7 F (36.5 C) (!) 97.3 F (36.3 C)  TempSrc: Oral Oral Oral Oral  SpO2:  98% 97% (!) 86%  Weight: 62.1 kg     Height: 5\' 7"  (1.702 m)       Intake/Output Summary (Last 24 hours) at  01/04/2020 1014 Last data filed at 01/04/2020 0848 Gross per 24 hour  Intake 480 ml  Output 400 ml  Net 80 ml   Filed Weights   01/03/20 1408  Weight: 62.1 kg    Examination: General exam: Appears  calm and comfortable. Respiratory system: Clear to auscultation. Respiratory effort normal. Cardiovascular system: S1 & S2 with ejection systolic murmur. No pedal edema. Gastrointestinal system: Abdomen is nondistended, soft and nontender. No organomegaly or masses felt. Normal bowel sounds heard. Central nervous system: Awake and alert.  Moves all extremities.   Extremities: No leg edema  Data Reviewed: I have personally reviewed following labs and imaging studies  CBC: Recent Labs  Lab 12/29/19 0941 01/02/20 1931 01/03/20 0239 01/04/20 0519  WBC 6.4 7.1 6.1 6.8  NEUTROABS 4.0  --   --   --   HGB 14.4 13.6 14.0 13.4  HCT 43.5 41.6 41.9 40.6  MCV 91.8 91.4 90.1 91.6  PLT 290 271 257 246   Basic Metabolic Panel: Recent Labs  Lab 01/02/20 1931 01/03/20 0239 01/04/20 0519  NA  --  138 138  K  --  4.2 4.4  CL  --  107 107  CO2  --  23 23  GLUCOSE  --  109* 101*  BUN  --  11 11  CREATININE 0.67 0.58 0.74  CALCIUM  --  8.9 8.6*  MG  --  2.0 2.0   GFR: Estimated Creatinine Clearance: 52.7 mL/min (by C-G formula based on SCr of 0.74 mg/dL). Liver Function Tests: No results for input(s): AST, ALT, ALKPHOS, BILITOT, PROT, ALBUMIN in the last 168 hours. No results for input(s): LIPASE, AMYLASE in the last 168 hours. No results for input(s): AMMONIA in the last 168 hours. Coagulation Profile: No results for input(s): INR, PROTIME in the last 168 hours. Cardiac Enzymes: No results for input(s): CKTOTAL, CKMB, CKMBINDEX, TROPONINI in the last 168 hours. BNP (last 3 results) No results for input(s): PROBNP in the last 8760 hours. HbA1C: No results for input(s): HGBA1C in the last 72 hours. CBG: No results for input(s): GLUCAP in the last 168 hours. Lipid Profile: No results for input(s): CHOL, HDL, LDLCALC, TRIG, CHOLHDL, LDLDIRECT in the last 72 hours. Thyroid Function Tests: Recent Labs    01/04/20 0519  TSH 2.139   Anemia Panel: No results for input(s): VITAMINB12,  FOLATE, FERRITIN, TIBC, IRON, RETICCTPCT in the last 72 hours. Urine analysis:    Component Value Date/Time   COLORURINE YELLOW 12/26/2019 0926   APPEARANCEUR CLEAR 12/26/2019 0926   LABSPEC 1.011 12/26/2019 0926   PHURINE 6.0 12/26/2019 0926   GLUCOSEU NEGATIVE 12/26/2019 0926   GLUCOSEU NEGATIVE 10/13/2014 1615   HGBUR NEGATIVE 12/26/2019 0926   BILIRUBINUR NEGATIVE 12/26/2019 0926   KETONESUR NEGATIVE 12/26/2019 0926   PROTEINUR NEGATIVE 12/26/2019 0926   UROBILINOGEN 0.2 10/13/2014 1615   NITRITE NEGATIVE 12/26/2019 0926   LEUKOCYTESUR SMALL (A) 12/26/2019 0926   Sepsis Labs: @LABRCNTIP (procalcitonin:4,lacticidven:4)  ) Recent Results (from the past 240 hour(s))  Urine Culture     Status: Abnormal   Collection Time: 12/26/19  9:31 AM   Specimen: Urine, Clean Catch  Result Value Ref Range Status   Specimen Description URINE, CLEAN CATCH  Final   Special Requests   Final    NONE Performed at [facility], [address] [place], [place] [address]    Culture 40,000 COLONIES/mL STAPHYLOCOCCUS LUGDUNENSIS (A)  Final   Report Status 12/28/2019 FINAL  Final   Organism ID,  Bacteria STAPHYLOCOCCUS LUGDUNENSIS (A)  Final      Susceptibility   Staphylococcus lugdunensis - MIC*    CIPROFLOXACIN <=0.5 SENSITIVE Sensitive     GENTAMICIN <=0.5 SENSITIVE Sensitive     NITROFURANTOIN <=16 SENSITIVE Sensitive     OXACILLIN 2 SENSITIVE Sensitive     TETRACYCLINE <=1 SENSITIVE Sensitive     VANCOMYCIN <=0.5 SENSITIVE Sensitive     TRIMETH/SULFA <=10 SENSITIVE Sensitive     CLINDAMYCIN <=0.25 SENSITIVE Sensitive     RIFAMPIN <=0.5 SENSITIVE Sensitive     Inducible Clindamycin NEGATIVE Sensitive     * 40,000 COLONIES/mL STAPHYLOCOCCUS LUGDUNENSIS  SARS CORONAVIRUS 2 (TAT 6-24 HRS) Nasopharyngeal Nasopharyngeal Swab     Status: None   Collection Time: 01/03/20  1:25 PM   Specimen: Nasopharyngeal Swab  Result Value Ref Range Status   SARS Coronavirus 2 NEGATIVE NEGATIVE Final      Comment: (NOTE) SARS-CoV-2 target nucleic acids are NOT DETECTED. The SARS-CoV-2 RNA is generally detectable in upper and lower respiratory specimens during the acute phase of infection. Negative results do not preclude SARS-CoV-2 infection, do not rule out co-infections with other pathogens, and should not be used as the sole basis for treatment or other patient management decisions. Negative results must be combined with clinical observations, patient history, and epidemiological information. The expected result is Negative. Fact Sheet for Patients: HairSlick.no Fact Sheet for Healthcare Providers: quierodirigir.com This test is not yet approved or cleared by the [place] FDA and  has been authorized for detection and/or diagnosis of SARS-CoV-2 by FDA under an Emergency Use Authorization (EUA). This EUA will remain  in effect (meaning this test can be used) for the duration of the COVID-19 declaration under Section 56 4(b)(1) of the Act, 21 U.S.C. section 360bbb-3(b)(1), unless the authorization is terminated or revoked sooner. Performed at [facility], [address] [place], [place] [address]          Radiology Studies: MR LUMBAR SPINE WO CONTRAST  Result Date: 01/03/2020 CLINICAL DATA:  Worsening back pain. Previous T12 and L1 kyphoplasty. Subsequent fall. EXAM: MRI LUMBAR SPINE WITHOUT CONTRAST TECHNIQUE: Multiplanar, multisequence MR imaging of the lumbar spine was performed. No intravenous contrast was administered. COMPARISON:  12/25/2019 FINDINGS: Segmentation:  5 lumbar type vertebral bodies. Alignment:  Mild straightening of the spine. Vertebrae: Previously augmented compression fractures at T12 and L1 do not show any additional loss of height or unexpected finding. There is a newly seen fracture at L2 at the superior endplate with loss of height of 10%. No retropulsed. This level was normal previously.  Conus medullaris and cauda equina: Conus extends to the L1 level. Conus and cauda equina appear normal. Paraspinal and other soft tissues: Negative Disc levels: Mild posterior bowing of the posterosuperior margins of T12 and L1 but no compressive stenosis. No disc pathology from L1-2 through L3-4. L4-5 shows chronic disc degeneration with mild bulging of the disc but no compressive stenosis. L5-S1 shows disc degeneration with minimal bulging of the disc but no stenosis. IMPRESSION: Good appearance at the previously augmented levels of T12 and L1. No further collapse or unexpected finding. Newly seen superior endplate fracture at L2 with loss of height of only about 10%. No retropulsed bone. This level was normal previously. Electronically Signed   By: Paulina Fusi M.D.   On: 01/03/2020 02:51   ECHOCARDIOGRAM COMPLETE  Result Date: 01/03/2020    ECHOCARDIOGRAM REPORT   Patient Name:   Amy Weiss Date of Exam: 01/03/2020  Medical Rec #:  [id number]      Height:       67.0 in Accession #:    [id number]     Weight:       137.0 lb Date of Birth:  21-Jan-1935     BSA:          1.72 m Patient Age:    84 years       BP:           147/61 mmHg Patient Gender: F              HR:           45 bpm. Exam Location:  Inpatient Procedure: 2D Echo Indications:    aortic valve disorder 424.1  History:        Patient has prior history of Echocardiogram examinations, most                 recent 01/22/2009.  Sonographer:    Delcie Roch Referring Phys: [id number] Ronnie Doo I Lyndsey Demos IMPRESSIONS  1. Bradycardia noted during study ~40 bpm. Unclear rhythm, could be Wenckebach. EKG correlation recommended.  2. Left ventricular ejection fraction, by estimation, is 55 to 60%. The left ventricle has normal function. The left ventricle has no regional wall motion abnormalities. Left ventricular diastolic parameters are indeterminate.  3. Right ventricular systolic function is normal. The right ventricular size is normal. There is normal  pulmonary artery systolic pressure.  4. The mitral valve is degenerative. Mild mitral valve regurgitation.  5. The aortic valve is tricuspid. Aortic valve regurgitation is not visualized. Mild aortic valve stenosis. Aortic valve area, by VTI measures 1.72 cm. Aortic valve mean gradient measures 11.5 mmHg. Aortic valve Vmax measures 2.60 m/s.  6. There is mild (Grade II) layered plaque involving the aortic root.  7. The inferior vena cava is normal in size with greater than 50% respiratory variability, suggesting right atrial pressure of 3 mmHg. Comparison(s): No prior Echocardiogram. FINDINGS  Left Ventricle: Left ventricular ejection fraction, by estimation, is 55 to 60%. The left ventricle has normal function. The left ventricle has no regional wall motion abnormalities. There is no left ventricular hypertrophy. Left ventricular diastolic parameters are indeterminate. Right Ventricle: The right ventricular size is normal. No increase in right ventricular wall thickness. Right ventricular systolic function is normal. There is normal pulmonary artery systolic pressure. The tricuspid regurgitant velocity is 2.57 m/s, and  with an assumed right atrial pressure of 3 mmHg, the estimated right ventricular systolic pressure is 29.4 mmHg. Left Atrium: Left atrial size was normal in size. Right Atrium: Right atrial size was normal in size. Pericardium: Trivial pericardial effusion is present. Presence of pericardial fat pad. Mitral Valve: The mitral valve is degenerative in appearance. There is moderate thickening of the mitral valve leaflet(s). There is moderate calcification of the mitral valve leaflet(s). Mild mitral valve regurgitation. Tricuspid Valve: The tricuspid valve is grossly normal. Tricuspid valve regurgitation is mild. Aortic Valve: The aortic valve is tricuspid. . There is moderate thickening and moderate calcification of the aortic valve. Aortic valve regurgitation is not visualized. Mild aortic stenosis  is present. There is moderate thickening of the aortic valve. There is moderate calcification of the aortic valve. Aortic valve mean gradient measures 11.5 mmHg. Aortic valve peak gradient measures 27.0 mmHg. Aortic valve area, by VTI measures 1.72 cm. Pulmonic Valve: The pulmonic valve was grossly normal. Pulmonic valve regurgitation is mild. Aorta: The aortic root is normal in size and structure. There  is mild (Grade II) layered plaque involving the aortic root. Venous: The inferior vena cava is normal in size with greater than 50% respiratory variability, suggesting right atrial pressure of 3 mmHg. IAS/Shunts: No atrial level shunt detected by color flow Doppler. Additional Comments: Bradycardia noted during study ~40 bpm. Unclear rhythm, could be Wenckebach. EKG correlation recommended.  LEFT VENTRICLE PLAX 2D LVIDd:         4.70 cm LVIDs:         2.80 cm LV PW:         1.10 cm LV IVS:        0.90 cm LVOT diam:     1.80 cm LV SV:         83.72 ml LV SV Index:   42.50 LVOT Area:     2.54 cm  RIGHT VENTRICLE RV S prime:     12.90 cm/s TAPSE (M-mode): 2.1 cm LEFT ATRIUM             Index       RIGHT ATRIUM           Index LA diam:        3.90 cm 2.26 cm/m  RA Area:     12.90 cm LA Vol (A2C):   49.9 ml 28.98 ml/m RA Volume:   29.20 ml  16.96 ml/m LA Vol (A4C):   48.7 ml 28.28 ml/m LA Biplane Vol: 51.8 ml 30.08 ml/m  AORTIC VALVE AV Area (Vmax):    1.48 cm AV Area (Vmean):   1.65 cm AV Area (VTI):     1.72 cm AV Vmax:           260.00 cm/s AV Vmean:          149.000 cm/s AV VTI:            0.487 m AV Peak Grad:      27.0 mmHg AV Mean Grad:      11.5 mmHg LVOT Vmax:         151.00 cm/s LVOT Vmean:        96.350 cm/s LVOT VTI:          0.329 m LVOT/AV VTI ratio: 0.68  AORTA Ao Root diam: 2.80 cm MITRAL VALVE               TRICUSPID VALVE MV Area (PHT): 2.02 cm    TR Peak grad:   26.4 mmHg MV Decel Time: 375 msec    TR Vmax:        257.00 cm/s MV E velocity: 65.60 cm/s MV A velocity: 87.40 cm/s  SHUNTS MV  E/A ratio:  0.75        Systemic VTI:  0.33 m                            Systemic Diam: 1.80 cm Lennie Odor MD Electronically signed by Lennie Odor MD Signature Date/Time: 01/03/2020/5:43:24 PM    Final    DG HIP UNILAT WITH PELVIS 2-3 VIEWS RIGHT  Result Date: 01/04/2020 CLINICAL DATA:  Fall, chronic right hip pain EXAM: DG HIP (WITH OR WITHOUT PELVIS) 2-3V RIGHT COMPARISON:  None. FINDINGS: No fracture or dislocation is seen. Bilateral hip joint spaces are symmetric. Visualized bony pelvis appears intact. IMPRESSION: Negative. Electronically Signed   By: Charline Bills M.D.   On: 01/04/2020 01:16        Scheduled Meds:  aspirin EC  81 mg Oral Daily  enoxaparin (LOVENOX) injection  40 mg Subcutaneous Q24H   levothyroxine  88 mcg Oral QAC breakfast   lidocaine  1 patch Transdermal Q24H   multivitamin with minerals  1 tablet Oral Daily   senna-docusate  2 tablet Oral QHS   Vitamin D (Ergocalciferol)  50,000 Units Oral Q7 days   Continuous Infusions:   LOS: 2 days    Time spent: 25 Minutes.Dana Allan, MD  [facility] Pager #: [phone] 7PM-7AM contact night coverage as above

## 2020-01-05 ENCOUNTER — Encounter (HOSPITAL_COMMUNITY): Payer: Self-pay | Admitting: Internal Medicine

## 2020-01-05 ENCOUNTER — Ambulatory Visit: Payer: Medicare HMO | Admitting: Internal Medicine

## 2020-01-05 DIAGNOSIS — R7303 Prediabetes: Secondary | ICD-10-CM | POA: Diagnosis present

## 2020-01-05 DIAGNOSIS — S32020A Wedge compression fracture of second lumbar vertebra, initial encounter for closed fracture: Principal | ICD-10-CM

## 2020-01-05 DIAGNOSIS — R001 Bradycardia, unspecified: Secondary | ICD-10-CM

## 2020-01-05 DIAGNOSIS — S32000A Wedge compression fracture of unspecified lumbar vertebra, initial encounter for closed fracture: Secondary | ICD-10-CM

## 2020-01-05 LAB — CBC
HCT: 44.8 % (ref 36.0–46.0)
Hemoglobin: 14.7 g/dL (ref 12.0–15.0)
MCH: 30.2 pg (ref 26.0–34.0)
MCHC: 32.8 g/dL (ref 30.0–36.0)
MCV: 92 fL (ref 80.0–100.0)
Platelets: 246 10*3/uL (ref 150–400)
RBC: 4.87 MIL/uL (ref 3.87–5.11)
RDW: 15 % (ref 11.5–15.5)
WBC: 5.8 10*3/uL (ref 4.0–10.5)
nRBC: 0 % (ref 0.0–0.2)

## 2020-01-05 LAB — PROTIME-INR
INR: 1 (ref 0.8–1.2)
Prothrombin Time: 13.1 seconds (ref 11.4–15.2)

## 2020-01-05 LAB — BASIC METABOLIC PANEL
Anion gap: 10 (ref 5–15)
BUN: 12 mg/dL (ref 8–23)
CO2: 24 mmol/L (ref 22–32)
Calcium: 8.9 mg/dL (ref 8.9–10.3)
Chloride: 104 mmol/L (ref 98–111)
Creatinine, Ser: 0.71 mg/dL (ref 0.44–1.00)
GFR calc Af Amer: >60 mL/min (ref >60)
GFR calc non Af Amer: >60 mL/min (ref >60)
Glucose, Bld: 91 mg/dL (ref 70–99)
Potassium: 4.7 mmol/L (ref 3.5–5.1)
Sodium: 138 mmol/L (ref 135–145)

## 2020-01-05 LAB — HEMOGLOBIN A1C
Hgb A1c MFr Bld: 5.7 % — ABNORMAL HIGH (ref 4.8–5.6)
Mean Plasma Glucose: 116.89 mg/dL

## 2020-01-05 LAB — MAGNESIUM: Magnesium: 2.1 mg/dL (ref 1.7–2.4)

## 2020-01-05 MED ORDER — SODIUM CHLORIDE 0.9 % IV SOLN: INTRAVENOUS | Status: DC

## 2020-01-05 NOTE — Consult Note (Signed)
Chief Complaint: Patient was seen in consultation today for L2 KP at the request of Dr Leonia Corona   Supervising Physician: Julieanne Cotton  Patient Status: [facility] - Out-pt  History of Present Illness: Amy Weiss is a 85 y.o. female   DM- diet controlled Hypothyroid Mild dementia Recent T12/L1 KP 12/29/19  Pt did well and was at home But fell back onto back and hospitalized for new back pain Unable to walk-- or get out of bed secondary pain Hydrocodone relieves pain only at rest If any movement pt has pain at 10/10 of scale  Pain is low back  MRI 2/13: IMPRESSION: Good appearance at the previously augmented levels of T12 and L1. No further collapse or unexpected finding. Newly seen superior endplate fracture at L2 with loss of height of only about 10%. No retropulsed bone. This level was normal Previously.  Asked to evaluate pt for L2 Kyphoplasty  Dr Inez Catalina has reviewed imaging and approves procedure Insurance pre authorization is underway  Pt and Dtr are aware Insurance pre Berkley Harvey can take hrs to days     Past Medical History:  Diagnosis Date  . Diabetes mellitus 4/09   A1C-6  . Familial tremor   . Hyperlipidemia   . Hypothyroidism   . Osteoporosis   . Tachycardia    AV Re-entry, s/p ablation aprox 2005  . TIA (transient ischemic attack) 12/09    Past Surgical History:  Procedure Laterality Date  . ABDOMINAL HYSTERECTOMY     no oophorectomy  . BLADDER SURGERY     x 2 in the 80s  . BREAST BIOPSY     L (-)  . IR KYPHO EA ADDL LEVEL THORACIC OR LUMBAR  12/29/2019  . IR KYPHO THORACIC WITH BONE BIOPSY  12/29/2019    Allergies: Sulfonamide derivatives  Medications: Prior to Admission medications   Medication Sig Start Date End Date Taking? Authorizing Provider  aspirin EC 81 MG tablet Take 81 mg by mouth daily.   Yes [provider]  cyclobenzaprine (FLEXERIL) 10 MG tablet Take 5 mg by mouth 2 (two) times daily as needed for muscle  spasms.  01/02/20  Yes Paz, Nolon Rod, MD  docusate sodium (COLACE) 100 MG capsule Take 100 mg by mouth every 6 (six) hours as needed (with each dose of Vicodin).   Yes [provider]  HYDROcodone-acetaminophen (NORCO/VICODIN) 5-325 MG tablet Take 1 tablet by mouth every 6 (six) hours as needed for up to 5 days for moderate pain. 12/31/19 01/05/20 Yes Ghimire, Lyndel Safe, MD  hydrocortisone 2.5 % cream Apply topically 2 (two) times daily. Patient taking differently: Apply 1 application topically 2 (two) times daily as needed (itching legs).  12/15/19  Yes [name], MD  levothyroxine (SYNTHROID) 88 MCG tablet Take 1 tablet (88 mcg total) by mouth daily before breakfast. 08/11/19  Yes Paz, Elita Quick E, MD  lidocaine (LIDODERM) 5 % Place 1 patch onto the skin daily for 6 days. Remove & Discard patch within 12 hours or as directed by MD Patient taking differently: Place 1 patch onto the skin daily as needed (pain). Remove & Discard patch within 12 hours or as directed by MD 01/01/20 01/07/20 Yes Dorcas Carrow, MD  OVER THE COUNTER MEDICATION Apply 1 application topically 5 (five) times daily as needed (back pain). "Comfrey Salve"   Yes [provider]  polyethylene glycol (MIRALAX / GLYCOLAX) 17 g packet Take 17 g by mouth daily as needed (constipation).    Yes [provider]  senna (SENOKOT) 8.6 MG tablet Take 1 tablet by mouth daily as needed for constipation.    Yes [provider]     Family History  Problem Relation Age of Onset  . Pancreatic cancer Brother   . Pancreatic cancer Father   . Coronary artery disease Mother 18  . Breast cancer Neg Hx   . Colon cancer Neg Hx     Social History   Socioeconomic History  . Marital status: Divorced    Spouse name: Not on file  . Number of children: 4  . Years of education: Not on file  . Highest education level: Not on file  Occupational History  . 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  Smoking status: Former Smoker    Quit date: 06/20/2012    Years since quitting: 7.5  . Smokeless tobacco: Never Used  Substance and Sexual Activity  . Alcohol use: Yes    Comment: beer rarely  . Drug use: No  . Sexual activity: Never  Other Topics Concern  . Not on file  Social History Narrative   Single, live by herself, lost a daughter in law   Daughter Amy Weiss lives close by   Ex- husband lives near   Social Determinants of Health   Financial Resource Strain:   . Difficulty of Paying Living Expenses: Not on file  Food Insecurity:   . Worried About Programme researcher, broadcasting/film/video in the Last Year: Not on file  . Ran Out of Food in the Last Year: Not on file  Transportation Needs:   . Lack of Transportation (Medical): Not on file  . Lack of Transportation (Non-Medical): Not on file  Physical Activity:   . Days of Exercise per Week: Not on file  . Minutes of Exercise per Session: Not on file  Stress:   . Feeling of Stress : Not on file  Social Connections:   . Frequency of Communication with Friends and Family: Not on file  . Frequency of Social Gatherings with Friends and Family: Not on file  . Attends Religious Services: Not on file  . Active Member of Clubs or Organizations: Not on file  . Attends Banker Meetings: Not on file  . Marital Status: Not on file    Review of Systems: A 12 point ROS discussed and pertinent positives are indicated in the HPI above.  All other systems are negative.  Review of Systems  Constitutional: Positive for activity change and fatigue. Negative for fever.  Respiratory: Negative for cough and shortness of breath.   Cardiovascular: Negative for chest pain.  Gastrointestinal: Negative for abdominal pain.  Musculoskeletal: Positive for back pain and gait problem.  Neurological: Positive for weakness.  Psychiatric/Behavioral: Negative for behavioral problems and confusion.    Vital Signs: BP (!) 145/56 (BP Location: Left Arm)   Pulse  (!) 41   Temp 98.5 F (36.9 C) (Oral)   Resp 17   Ht 5\' 7"  (1.702 m)   Wt 137 lb (62.1 kg)   SpO2 96%   BMI 21.46 kg/m   Physical Exam Vitals reviewed.  Cardiovascular:     Rate and Rhythm: Normal rate and regular rhythm.     Heart sounds: Normal heart sounds.  Pulmonary:     Effort: Pulmonary effort is normal.     Breath sounds: Normal breath sounds.  Abdominal:     Palpations: Abdomen is soft.  Musculoskeletal:  General: Normal range of motion.     Comments: Can move all 4s Unable to move out of bed Pain is minimal at rest   Skin:    General: Skin is warm and dry.  Neurological:     Mental Status: She is alert and oriented to person, place, and time.  Psychiatric:        Behavior: Behavior normal.     Imaging: CT Lumbar Spine Wo Contrast  Result Date: 12/23/2019 CLINICAL DATA:  Low back pain. EXAM: CT LUMBAR SPINE WITHOUT CONTRAST TECHNIQUE: Multidetector CT imaging of the lumbar spine was performed without intravenous contrast administration. Multiplanar CT image reconstructions were also generated. COMPARISON:  None. FINDINGS: Segmentation: 5 lumbar type vertebrae. Alignment: Normal. Vertebrae: Acute L1 vertebral body compression fracture with approximately 60% height loss and 6 mm of retropulsion of the superior posterior margin. Mild age-indeterminate T12 vertebral body compression fracture. No aggressive osseous lesion. Paraspinal and other soft tissues: No acute paraspinal abnormality. Abdominal aortic atherosclerosis. Mild abdominal aortic ectasia measuring 2.5 cm. Disc levels: Degenerative disc disease with disc height loss at L4-5. severe degenerative disease with disc height loss at L5-S1. Bilateral facet arthropathy at L5-S1. No significant foraminal stenosis. IMPRESSION: 1. Acute L1 vertebral body compression fracture with approximately 60% height loss and 6 mm of retropulsion of the superior posterior margin. 2. Mild age-indeterminate T12 vertebral body  compression fracture. 3. 2.5 cm ectatic abdominal aorta, at risk for aneurysm development. Recommend follow-up aortic ultrasound in 5 years. This recommendation follows ACR consensus guidelines: White Paper of the ACR Incidental Findings Committee II on Vascular Findings. J Am Coll Radiol 2013; 16:109-604. 4.  Aortic Atherosclerosis (ICD10-I70.0). Electronically Signed   By: Elige Ko   On: 12/23/2019 14:12   MR LUMBAR SPINE WO CONTRAST  Result Date: 01/03/2020 CLINICAL DATA:  Worsening back pain. Previous T12 and L1 kyphoplasty. Subsequent fall. EXAM: MRI LUMBAR SPINE WITHOUT CONTRAST TECHNIQUE: Multiplanar, multisequence MR imaging of the lumbar spine was performed. No intravenous contrast was administered. COMPARISON:  12/25/2019 FINDINGS: Segmentation:  5 lumbar type vertebral bodies. Alignment:  Mild straightening of the spine. Vertebrae: Previously augmented compression fractures at T12 and L1 do not show any additional loss of height or unexpected finding. There is a newly seen fracture at L2 at the superior endplate with loss of height of 10%. No retropulsed. This level was normal previously. Conus medullaris and cauda equina: Conus extends to the L1 level. Conus and cauda equina appear normal. Paraspinal and other soft tissues: Negative Disc levels: Mild posterior bowing of the posterosuperior margins of T12 and L1 but no compressive stenosis. No disc pathology from L1-2 through L3-4. L4-5 shows chronic disc degeneration with mild bulging of the disc but no compressive stenosis. L5-S1 shows disc degeneration with minimal bulging of the disc but no stenosis. IMPRESSION: Good appearance at the previously augmented levels of T12 and L1. No further collapse or unexpected finding. Newly seen superior endplate fracture at L2 with loss of height of only about 10%. No retropulsed bone. This level was normal previously. Electronically Signed   By: Paulina Fusi M.D.   On: 01/03/2020 02:51   MR LUMBAR SPINE  WO CONTRAST  Result Date: 12/25/2019 CLINICAL DATA:  Lumbar compression fracture EXAM: MRI LUMBAR SPINE WITHOUT CONTRAST TECHNIQUE: Multiplanar, multisequence MR imaging of the lumbar spine was performed. No intravenous contrast was administered. COMPARISON:  CT 12/23/2019 FINDINGS: Segmentation:  Normal Alignment:  Normal Vertebrae: Mild superior endplate compression fracture of T12 with bone marrow edema.  No change from the prior CT. Moderate compression fracture of L1 with approximately 50% loss of vertebral body height and diffuse bone marrow edema. Slight retropulsion of the superior endplate of L1 into the canal without significant stenosis. No change from recent CT. No other fracture or evidence of metastatic disease in the lumbar spine Conus medullaris and cauda equina: Conus extends to the L1-2 level. Conus and cauda equina appear normal. Paraspinal and other soft tissues: Negative Disc levels: T12-L1: Mild retropulsion of L1 into the spinal canal without significant spinal stenosis. L1-2: Mild disc degeneration without stenosis L2-3: Negative L3-4: Mild disc degeneration. Negative for disc protrusion or stenosis L4-5: Mild to moderate disc degeneration with disc space narrowing. Small central disc protrusion. Negative for stenosis L5-S1: Disc degeneration with disc space narrowing. Mild endplate spurring on the left without significant foraminal stenosis IMPRESSION: Mild compression fracture T12 appears acute Moderate compression fracture L1 appears acute. Mild retropulsion of the superior endplate of L1 into the canal without significant stenosis Mild lumbar degenerative change without spinal stenosis. Electronically Signed   By: Marlan Palau M.D.   On: 12/25/2019 14:21   IR KYPHO THORACIC WITH BONE BIOPSY  Result Date: 12/29/2019 INDICATION: Severe low back pain secondary to compression fractures at T12 and L1.  EXAM: BALLOON KYPHOPLASTY AT T12 AND L1  COMPARISON:  MRI of the lumbosacral spine of  December 25, 2019.  MEDICATIONS: As antibiotic prophylaxis, Ancef 2 g IV was ordered pre-procedure and administered intravenously within 1 hour of incision.  ANESTHESIA/SEDATION: Moderate (conscious) sedation was employed during this procedure. A total of Versed 2 mg and Fentanyl 75 mcg was administered intravenously.  Moderate Sedation Time: 42 minutes. The patient's level of consciousness and vital signs were monitored continuously by radiology nursing throughout the procedure under my direct supervision.  FLUOROSCOPY TIME:  Fluoroscopy Time: 15 minutes 18 seconds (469 mGy)  COMPLICATIONS: None immediate.  PROCEDURE: Following a full explanation of the procedure along with the potential associated complications, an informed witnessed consent was obtained.  The patient was placed prone on the fluoroscopic table. The skin overlying the thoracolumbar region was then prepped and draped in the usual sterile fashion. The right pedicle at T12, and the left pedicle at L1 were then infiltrated with 0.25% bupivacaine followed by the advancement of an 11-gauge Jamshidi needle through the right pedicle at T12, and the left pedicle at L1 into the posterior one-third at these 2 levels. These were then exchanged for a Kyphon advanced osteo introducer system comprised of a working cannula and a Kyphon osteo drill at the 2 levels.  These combinations were then advanced over a Kyphon osteo bone pin until the tips of the Kyphon osteo drills were in the posterior third at the 2 levels.  At this time, the bone pins were removed. In a medial trajectory, the combination were advanced until the tips of the working cannulae were inside the posterior one-third at T12 and L1.  Through the working cannulae, a Kyphon inflatable bone tamp 20 x 3 was advanced and positioned with the distal marker 5 mm from the anterior aspect of T12 and L1. Crossing of the midline was seen on the AP projection at T12. At this time, the balloons were  expanded using contrast via a Kyphon inflation syringe device via micro tubing.  Inflations were continued until there was apposition with the superior and the inferior endplates.  At this time, methylmethacrylate mixture was reconstituted with Tobramycin in the Kyphon bone mixing device system. This  was then loaded onto the Kyphon bone fillers.  The balloons were deflated and removed followed by the instillation of 3-1/2 bone filler equivalents of methylmethacrylate mixture at T12, and 3 bone filler equivalents of methylmethacrylate mixture at L1 with excellent filling in the AP and lateral projections. T12 crosses the midline where the methylmethacrylate mixture was seen.  No extravasation of the methylmethacrylate mixture was noted in the disk spaces or posteriorly into the spinal canal. No epidural venous contamination was seen.  The working cannulae and the bone fillers were then retrieved and removed. Hemostasis was achieved at the skin entry sites.  IMPRESSION: 1. Status post vertebral body augmentation using balloon kyphoplasty at T12 and L1 as described without event.   Electronically Signed   By: Julieanne CottonSanjeev  Deveshwar M.D.   On: 12/30/2019 09:31   IR KYPHO EA ADDL LEVEL THORACIC OR LUMBAR  Result Date: 12/31/2019 INDICATION: Severe low back pain secondary to compression fractures at T12 and L1. EXAM: BALLOON KYPHOPLASTY AT T12 AND L1 COMPARISON:  MRI of the lumbosacral spine of December 25, 2019. MEDICATIONS: As antibiotic prophylaxis, Ancef 2 g IV was ordered pre-procedure and administered intravenously within 1 hour of incision. ANESTHESIA/SEDATION: Moderate (conscious) sedation was employed during this procedure. A total of Versed 2 mg and Fentanyl 75 mcg was administered intravenously. Moderate Sedation Time: 42 minutes. The patient's level of consciousness and vital signs were monitored continuously by radiology nursing throughout the procedure under my direct supervision. FLUOROSCOPY TIME:   Fluoroscopy Time: 15 minutes 18 seconds (469 mGy) COMPLICATIONS: None immediate. PROCEDURE: Following a full explanation of the procedure along with the potential associated complications, an informed witnessed consent was obtained. The patient was placed prone on the fluoroscopic table. The skin overlying the thoracolumbar region was then prepped and draped in the usual sterile fashion. The right pedicle at T12, and the left pedicle at L1 were then infiltrated with 0.25% bupivacaine followed by the advancement of an 11-gauge Jamshidi needle through the right pedicle at T12, and the left pedicle at L1 into the posterior one-third at these 2 levels. These were then exchanged for a Kyphon advanced osteo introducer system comprised of a working cannula and a Kyphon osteo drill at the 2 levels. These combinations were then advanced over a Kyphon osteo bone pin until the tips of the Kyphon osteo drills were in the posterior third at the 2 levels. At this time, the bone pins were removed. In a medial trajectory, the combination were advanced until the tips of the working cannulae were inside the posterior one-third at T12 and L1. Through the working cannulae, a Kyphon inflatable bone tamp 20 x 3 was advanced and positioned with the distal marker 5 mm from the anterior aspect of T12 and L1. Crossing of the midline was seen on the AP projection at T12. At this time, the balloons were expanded using contrast via a Kyphon inflation syringe device via micro tubing. Inflations were continued until there was apposition with the superior and the inferior endplates. At this time, methylmethacrylate mixture was reconstituted with Tobramycin in the Kyphon bone mixing device system. This was then loaded onto the Kyphon bone fillers. The balloons were deflated and removed followed by the instillation of 3-1/2 bone filler equivalents of methylmethacrylate mixture at T12, and 3 bone filler equivalents of methylmethacrylate mixture at L1  with excellent filling in the AP and lateral projections. T12 crosses the midline where the methylmethacrylate mixture was seen. No extravasation of the methylmethacrylate mixture was noted in  the disk spaces or posteriorly into the spinal canal. No epidural venous contamination was seen. The working cannulae and the bone fillers were then retrieved and removed. Hemostasis was achieved at the skin entry sites. IMPRESSION: 1. Status post vertebral body augmentation using balloon kyphoplasty at T12 and L1 as described without event. Electronically Signed   By: Julieanne CottonSanjeev  Deveshwar M.D.   On: 12/30/2019 09:31   ECHOCARDIOGRAM COMPLETE  Result Date: 01/03/2020    ECHOCARDIOGRAM REPORT   Patient Name:   Abbott PaoLLENOR J Siegert Date of Exam: 01/03/2020 Medical Rec #:  [id number]      Height:       67.0 in Accession #:    [id number]     Weight:       137.0 lb Date of Birth:  February 07, 1935     BSA:          1.72 m Patient Age:    84 years       BP:           147/61 mmHg Patient Gender: F              HR:           45 bpm. Exam Location:  Inpatient Procedure: 2D Echo Indications:    aortic valve disorder 424.1  History:        Patient has prior history of Echocardiogram examinations, most                 recent 01/22/2009.  Sonographer:    Delcie RochLauren Pennington Referring Phys: [id number] SYLVESTER I OGBATA IMPRESSIONS  1. Bradycardia noted during study ~40 bpm. Unclear rhythm, could be Wenckebach. EKG correlation recommended.  2. Left ventricular ejection fraction, by estimation, is 55 to 60%. The left ventricle has normal function. The left ventricle has no regional wall motion abnormalities. Left ventricular diastolic parameters are indeterminate.  3. Right ventricular systolic function is normal. The right ventricular size is normal. There is normal pulmonary artery systolic pressure.  4. The mitral valve is degenerative. Mild mitral valve regurgitation.  5. The aortic valve is tricuspid. Aortic valve regurgitation is not visualized. Mild  aortic valve stenosis. Aortic valve area, by VTI measures 1.72 cm. Aortic valve mean gradient measures 11.5 mmHg. Aortic valve Vmax measures 2.60 m/s.  6. There is mild (Grade II) layered plaque involving the aortic root.  7. The inferior vena cava is normal in size with greater than 50% respiratory variability, suggesting right atrial pressure of 3 mmHg. Comparison(s): No prior Echocardiogram. FINDINGS  Left Ventricle: Left ventricular ejection fraction, by estimation, is 55 to 60%. The left ventricle has normal function. The left ventricle has no regional wall motion abnormalities. There is no left ventricular hypertrophy. Left ventricular diastolic parameters are indeterminate. Right Ventricle: The right ventricular size is normal. No increase in right ventricular wall thickness. Right ventricular systolic function is normal. There is normal pulmonary artery systolic pressure. The tricuspid regurgitant velocity is 2.57 m/s, and  with an assumed right atrial pressure of 3 mmHg, the estimated right ventricular systolic pressure is 29.4 mmHg. Left Atrium: Left atrial size was normal in size. Right Atrium: Right atrial size was normal in size. Pericardium: Trivial pericardial effusion is present. Presence of pericardial fat pad. Mitral Valve: The mitral valve is degenerative in appearance. There is moderate thickening of the mitral valve leaflet(s). There is moderate calcification of the mitral valve leaflet(s). Mild mitral valve regurgitation. Tricuspid Valve: The tricuspid valve is grossly normal. Tricuspid valve regurgitation is mild.  Aortic Valve: The aortic valve is tricuspid. . There is moderate thickening and moderate calcification of the aortic valve. Aortic valve regurgitation is not visualized. Mild aortic stenosis is present. There is moderate thickening of the aortic valve. There is moderate calcification of the aortic valve. Aortic valve mean gradient measures 11.5 mmHg. Aortic valve peak gradient  measures 27.0 mmHg. Aortic valve area, by VTI measures 1.72 cm. Pulmonic Valve: The pulmonic valve was grossly normal. Pulmonic valve regurgitation is mild. Aorta: The aortic root is normal in size and structure. There is mild (Grade II) layered plaque involving the aortic root. Venous: The inferior vena cava is normal in size with greater than 50% respiratory variability, suggesting right atrial pressure of 3 mmHg. IAS/Shunts: No atrial level shunt detected by color flow Doppler. Additional Comments: Bradycardia noted during study ~40 bpm. Unclear rhythm, could be Wenckebach. EKG correlation recommended.  LEFT VENTRICLE PLAX 2D LVIDd:         4.70 cm LVIDs:         2.80 cm LV PW:         1.10 cm LV IVS:        0.90 cm LVOT diam:     1.80 cm LV SV:         83.72 ml LV SV Index:   42.50 LVOT Area:     2.54 cm  RIGHT VENTRICLE RV S prime:     12.90 cm/s TAPSE (M-mode): 2.1 cm LEFT ATRIUM             Index       RIGHT ATRIUM           Index LA diam:        3.90 cm 2.26 cm/m  RA Area:     12.90 cm LA Vol (A2C):   49.9 ml 28.98 ml/m RA Volume:   29.20 ml  16.96 ml/m LA Vol (A4C):   48.7 ml 28.28 ml/m LA Biplane Vol: 51.8 ml 30.08 ml/m  AORTIC VALVE AV Area (Vmax):    1.48 cm AV Area (Vmean):   1.65 cm AV Area (VTI):     1.72 cm AV Vmax:           260.00 cm/s AV Vmean:          149.000 cm/s AV VTI:            0.487 m AV Peak Grad:      27.0 mmHg AV Mean Grad:      11.5 mmHg LVOT Vmax:         151.00 cm/s LVOT Vmean:        96.350 cm/s LVOT VTI:          0.329 m LVOT/AV VTI ratio: 0.68  AORTA Ao Root diam: 2.80 cm MITRAL VALVE               TRICUSPID VALVE MV Area (PHT): 2.02 cm    TR Peak grad:   26.4 mmHg MV Decel Time: 375 msec    TR Vmax:        257.00 cm/s MV E velocity: 65.60 cm/s MV A velocity: 87.40 cm/s  SHUNTS MV E/A ratio:  0.75        Systemic VTI:  0.33 m                            Systemic Diam: 1.80 cm Lennie Odor MD Electronically signed by Lennie Odor MD Signature Date/Time:  01/03/2020/5:43:24 PM    Final    DG HIP UNILAT WITH PELVIS 2-3 VIEWS RIGHT  Result Date: 01/04/2020 CLINICAL DATA:  Fall, chronic right hip pain EXAM: DG HIP (WITH OR WITHOUT PELVIS) 2-3V RIGHT COMPARISON:  None. FINDINGS: No fracture or dislocation is seen. Bilateral hip joint spaces are symmetric. Visualized bony pelvis appears intact. IMPRESSION: Negative. Electronically Signed   By: Charline Bills M.D.   On: 01/04/2020 01:16    Labs:  CBC: Recent Labs    12/29/19 0941 01/02/20 1931 01/03/20 0239 01/04/20 0519  WBC 6.4 7.1 6.1 6.8  HGB 14.4 13.6 14.0 13.4  HCT 43.5 41.6 41.9 40.6  PLT 290 271 257 246    COAGS: Recent Labs    12/26/19 0814  INR 1.0    BMP: Recent Labs    12/23/19 1329 12/23/19 1329 12/24/19 0431 01/02/20 1931 01/03/20 0239 01/04/20 0519  NA 138  --  141  --  138 138  K 4.2  --  4.1  --  4.2 4.4  CL 107  --  110  --  107 107  CO2 24  --  23  --  23 23  GLUCOSE 95  --  109*  --  109* 101*  BUN 23  --  18  --  11 11  CALCIUM 9.3  --  8.8*  --  8.9 8.6*  CREATININE 0.61   < > 0.59 0.67 0.58 0.74  GFRNONAA >60   < > >60 >60 >60 >60  GFRAA >60   < > >60 >60 >60 >60   < > = values in this interval not displayed.    LIVER FUNCTION TESTS: Recent Labs    12/23/19 1329  BILITOT 0.6  AST 22  ALT 11  ALKPHOS 88  PROT 7.4  ALBUMIN 3.6    TUMOR MARKERS: No results for input(s): AFPTM, CEA, CA199, CHROMGRNA in the last 8760 hours.  Assessment and Plan:  T12-L1 KP 2/8  Good pain relief until new fall at home Now new L2 fracture Painful - using Hydrocodone with little relief Scheduled now for L2 KP for today - or likely tomorrow Risks and benefits of Lumbar 2 Kyphoplasty were discussed with the patient including, but not limited to education regarding the natural healing process of compression fractures without intervention, bleeding, infection, cement migration which may cause spinal cord damage, paralysis, pulmonary embolism or even  death.  This interventional procedure involves the use of X-rays and because of the nature of the planned procedure, it is possible that we will have prolonged use of X-ray fluoroscopy.  Potential radiation risks to you include (but are not limited to) the following: - A slightly elevated risk for cancer  several years later in life. This risk is typically less than 0.5% percent. This risk is low in comparison to the normal incidence of human cancer, which is 33% for women and 50% for men according to the [employer]. - Radiation induced injury can include skin redness, resembling a rash, tissue breakdown / ulcers and hair loss (which can be temporary or permanent).   The likelihood of either of these occurring depends on the difficulty of the procedure and whether you are sensitive to radiation due to previous procedures, disease, or genetic conditions.   IF your procedure requires a prolonged use of radiation, you will be notified and given written instructions for further action.  It is your responsibility to monitor the irradiated area for the 2 weeks following the  procedure and to notify your physician if you are concerned that you have suffered a radiation induced injury.    All of the patient's questions were answered, patient is agreeable to proceed.  Consent signed and in chart.  Thank you for this interesting consult.  I greatly enjoyed meeting Amy Weiss and look forward to participating in their care.  A copy of this report was sent to the requesting provider on this date.  Electronically Signed: Lavonia Drafts, PA-C 01/05/2020, 9:47 AM   I spent a total of  30 Minutes   in face to face in clinical consultation, greater than 50% of which was counseling/coordinating care for L2 KP

## 2020-01-05 NOTE — Progress Notes (Signed)
PROGRESS NOTE  Amy Weiss UYQ:[id number] DOB: 1934/12/11 DOA: 01/02/2020 PCP: [name], MD  HPI/Recap of past 24 hours: Patient is an 85 year old female with past medical history of prediabetes, hypothyroidism and recent fall leading to compression fracture of the T12/L1 vertebral area status post kyphoplasty.  Patient was discharged home with home health and soon after discharge, sustained a mechanical fall and had new back pain.  Patient was arranged as a direct admission and lumbar MRI noted no new findings at previous vertebroplasty area, but no seen fracture at L2.  Case was discussed with neurosurgery who recommended patient be evaluated by interventional radiology who had done previous kyphoplasty.  Patient seen by interventional radiology with original plans to take patient on 2/15 pending insurance approval.  By time patient's insurance was approved, no availability for 2/15 and patient will have kyphoplasty done 2/16.  Patient seen this morning.  Doing okay complains of some mild back pain.  Denies any other complaint.  Assessment/Plan: Principal Problem:   Closed compression fracture of L2 lumbar vertebra, initial encounter [facility]): Appreciate interventional radiology.  Plan for kyphoplasty tomorrow.  Patient to be seen by PT and OT after. Active Problems:   Bradycardia: At times, patient's blood pressure diastolic goes slightly low into the 40s and she gets a little bradycardic with a heart rate in the 40s.  Asymptomatic.  Suspect it is from her pain medication.  She is not on any chronotropic agents and her TSH is normal.  She has had this happen before.  We will try some gentle IV fluids and keep her on telemetry.    Pre-diabetes: A1c about a year ago at 6.0.  Will repeat A1c.  Hypothyroidism: Continue Synthroid  Code Status: DNR  Family Communication: Daughter at the bedside  Disposition Plan: Hopefully home with home health after cleared following  vertebroplasty   Consultants:  Discussed with neurosurgery  Interventional radiology  Procedures:  Plan kyphoplasty 2/16  Antimicrobials:  None  DVT prophylaxis: Lovenox stopped before 2/15 dose. Changed to SCDs   Objective: Vitals:   01/05/20 0757 01/05/20 1342  BP: (!) 145/56 (!) 118/43  Pulse: (!) 41 (!) 42  Resp: 17 17  Temp: 98.5 F (36.9 C) 98.4 F (36.9 C)  SpO2: 96% 96%    Intake/Output Summary (Last 24 hours) at 01/05/2020 1410 Last data filed at 01/05/2020 0500 Gross per 24 hour  Intake 240 ml  Output 1350 ml  Net -1110 ml   Filed Weights   01/03/20 1408  Weight: 62.1 kg   Body mass index is 21.46 kg/m.  Exam:   General: Alert and oriented x2, no acute distress  HEENT: Normocephalic and atraumatic, mucous membranes slightly dry  Cardiovascular: Regular rate and rhythm, S1-S2, borderline bradycardic  Respiratory: Clear to auscultation bilaterally  Abdomen: Soft, nontender, nondistended, positive bowel sounds  Musculoskeletal: No clubbing or cyanosis or edema  Skin: No skin breaks, tears or lesions  Psychiatry: Appropriate, no evidence of psychoses   Data Reviewed: CBC: Recent Labs  Lab 01/02/20 1931 01/03/20 0239 01/04/20 0519 01/05/20 0916  WBC 7.1 6.1 6.8 5.8  HGB 13.6 14.0 13.4 14.7  HCT 41.6 41.9 40.6 44.8  MCV 91.4 90.1 91.6 92.0  PLT 271 257 246 246   Basic Metabolic Panel: Recent Labs  Lab 01/02/20 1931 01/03/20 0239 01/04/20 0519 01/05/20 0916  NA  --  138 138 138  K  --  4.2 4.4 4.7  CL  --  107 107 104  CO2  --  23 23 24   GLUCOSE  --  109* 101* 91  BUN  --  11 11 12   CREATININE 0.67 0.58 0.74 0.71  CALCIUM  --  8.9 8.6* 8.9  MG  --  2.0 2.0 2.1   GFR: Estimated Creatinine Clearance: 52.7 mL/min (by C-G formula based on SCr of 0.71 mg/dL). Liver Function Tests: No results for input(s): AST, ALT, ALKPHOS, BILITOT, PROT, ALBUMIN in the last 168 hours. No results for input(s): LIPASE, AMYLASE in the  last 168 hours. No results for input(s): AMMONIA in the last 168 hours. Coagulation Profile: Recent Labs  Lab 01/05/20 0916  INR 1.0   Cardiac Enzymes: No results for input(s): CKTOTAL, CKMB, CKMBINDEX, TROPONINI in the last 168 hours. BNP (last 3 results) No results for input(s): PROBNP in the last 8760 hours. HbA1C: No results for input(s): HGBA1C in the last 72 hours. CBG: No results for input(s): GLUCAP in the last 168 hours. Lipid Profile: No results for input(s): CHOL, HDL, LDLCALC, TRIG, CHOLHDL, LDLDIRECT in the last 72 hours. Thyroid Function Tests: Recent Labs    01/04/20 0519  TSH 2.139   Anemia Panel: No results for input(s): VITAMINB12, FOLATE, FERRITIN, TIBC, IRON, RETICCTPCT in the last 72 hours. Urine analysis:    Component Value Date/Time   COLORURINE YELLOW 12/26/2019 0926   APPEARANCEUR CLEAR 12/26/2019 0926   LABSPEC 1.011 12/26/2019 0926   PHURINE 6.0 12/26/2019 0926   GLUCOSEU NEGATIVE 12/26/2019 0926   GLUCOSEU NEGATIVE 10/13/2014 1615   HGBUR NEGATIVE 12/26/2019 0926   BILIRUBINUR NEGATIVE 12/26/2019 0926   KETONESUR NEGATIVE 12/26/2019 0926   PROTEINUR NEGATIVE 12/26/2019 0926   UROBILINOGEN 0.2 10/13/2014 1615   NITRITE NEGATIVE 12/26/2019 0926   LEUKOCYTESUR SMALL (A) 12/26/2019 0926   Sepsis Labs: @LABRCNTIP (procalcitonin:4,lacticidven:4)  ) Recent Results (from the past 240 hour(s))  SARS CORONAVIRUS 2 (TAT 6-24 HRS) Nasopharyngeal Nasopharyngeal Swab     Status: None   Collection Time: 01/03/20  1:25 PM   Specimen: Nasopharyngeal Swab  Result Value Ref Range Status   SARS Coronavirus 2 NEGATIVE NEGATIVE Final    Comment: (NOTE) SARS-CoV-2 target nucleic acids are NOT DETECTED. The SARS-CoV-2 RNA is generally detectable in upper and lower respiratory specimens during the acute phase of infection. Negative results do not preclude SARS-CoV-2 infection, do not rule out co-infections with other pathogens, and should not be used as  the sole basis for treatment or other patient management decisions. Negative results must be combined with clinical observations, patient history, and epidemiological information. The expected result is Negative. Fact Sheet for Patients: SugarRoll.be Fact Sheet for Healthcare Providers: https://www.woods-mathews.com/ This test is not yet approved or cleared by the [place] FDA and  has been authorized for detection and/or diagnosis of SARS-CoV-2 by FDA under an Emergency Use Authorization (EUA). This EUA will remain  in effect (meaning this test can be used) for the duration of the COVID-19 declaration under Section 56 4(b)(1) of the Act, 21 U.S.C. section 360bbb-3(b)(1), unless the authorization is terminated or revoked sooner. Performed at [facility], [place] [address] [place], [place] [address]       Studies: No results found.  Scheduled Meds: . aspirin EC  81 mg Oral Daily  . enoxaparin (LOVENOX) injection  40 mg Subcutaneous Q24H  . levothyroxine  88 mcg Oral QAC breakfast  . multivitamin with minerals  1 tablet Oral Daily  . senna-docusate  2 tablet Oral QHS  . Vitamin D (Ergocalciferol)  50,000 Units Oral Q7 days  Continuous Infusions: . sodium chloride       LOS: 2 days     Hollice Espy, MD [facility]   01/05/2020, 2:10 PM

## 2020-01-05 NOTE — Progress Notes (Signed)
Pt's MEWS turned "2 yellow" d/t CCMD reporting HR of 37. Pt has hx of bradycardia, with baseline HR between 30-40's. No acute change present. Will continue to monitor.

## 2020-01-05 NOTE — Plan of Care (Addendum)
Pt's HR and diastolic BP running in the 40's, informed Rito Ehrlich, MD. Requested IV fluid orders and inquired about possible ekg/tele orders.   MD ordered IV fluids, stop lidocaine patch, and start tele monitoring.   Problem: Education: Goal: Ability to verbalize activity precautions or restrictions will improve Outcome: Progressing Goal: Knowledge of the prescribed therapeutic regimen will improve Outcome: Progressing   Problem: Activity: Goal: Ability to avoid complications of mobility impairment will improve Outcome: Progressing Goal: Will remain free from falls Outcome: Progressing   Problem: Clinical Measurements: Goal: Postoperative complications will be avoided or minimized Outcome: Progressing   Problem: Pain Management: Goal: Pain level will decrease Outcome: Progressing   Problem: Skin Integrity: Goal: Will show signs of wound healing Outcome: Progressing   Problem: Health Behavior/Discharge Planning: Goal: Identification of resources available to assist in meeting health care needs will improve Outcome: Progressing

## 2020-01-05 NOTE — Progress Notes (Addendum)
Patient ID: Amy Weiss, female   DOB: 1935/08/03, 85 y.o.   MRN: [id number]   Insurance Pre authorization HAS been approved  Will schedule IR procedure for 2/16 am

## 2020-01-06 ENCOUNTER — Inpatient Hospital Stay (HOSPITAL_COMMUNITY): Payer: Medicare HMO

## 2020-01-06 DIAGNOSIS — S32020D Wedge compression fracture of second lumbar vertebra, subsequent encounter for fracture with routine healing: Secondary | ICD-10-CM

## 2020-01-06 DIAGNOSIS — E039 Hypothyroidism, unspecified: Secondary | ICD-10-CM | POA: Diagnosis present

## 2020-01-06 DIAGNOSIS — F0390 Unspecified dementia without behavioral disturbance: Secondary | ICD-10-CM | POA: Diagnosis present

## 2020-01-06 DIAGNOSIS — Z66 Do not resuscitate: Secondary | ICD-10-CM

## 2020-01-06 DIAGNOSIS — Z515 Encounter for palliative care: Secondary | ICD-10-CM

## 2020-01-06 DIAGNOSIS — R4189 Other symptoms and signs involving cognitive functions and awareness: Secondary | ICD-10-CM

## 2020-01-06 HISTORY — PX: IR KYPHO LUMBAR INC FX REDUCE BONE BX UNI/BIL CANNULATION INC/IMAGING: IMG5519

## 2020-01-06 LAB — CBC
HCT: 39.9 % (ref 36.0–46.0)
Hemoglobin: 13.2 g/dL (ref 12.0–15.0)
MCH: 30.1 pg (ref 26.0–34.0)
MCHC: 33.1 g/dL (ref 30.0–36.0)
MCV: 90.9 fL (ref 80.0–100.0)
Platelets: 233 10*3/uL (ref 150–400)
RBC: 4.39 MIL/uL (ref 3.87–5.11)
RDW: 15 % (ref 11.5–15.5)
WBC: 6.1 10*3/uL (ref 4.0–10.5)
nRBC: 0 % (ref 0.0–0.2)

## 2020-01-06 LAB — BASIC METABOLIC PANEL
Anion gap: 7 (ref 5–15)
BUN: 13 mg/dL (ref 8–23)
CO2: 23 mmol/L (ref 22–32)
Calcium: 8.7 mg/dL — ABNORMAL LOW (ref 8.9–10.3)
Chloride: 108 mmol/L (ref 98–111)
Creatinine, Ser: 0.66 mg/dL (ref 0.44–1.00)
GFR calc Af Amer: >60 mL/min (ref >60)
GFR calc non Af Amer: >60 mL/min (ref >60)
Glucose, Bld: 95 mg/dL (ref 70–99)
Potassium: 4.3 mmol/L (ref 3.5–5.1)
Sodium: 138 mmol/L (ref 135–145)

## 2020-01-06 LAB — MAGNESIUM: Magnesium: 1.9 mg/dL (ref 1.7–2.4)

## 2020-01-06 MED ORDER — BUPIVACAINE HCL (PF) 0.5 % IJ SOLN
INTRAMUSCULAR | Status: AC
  Administered 2020-01-06: 13:00:00 30 mL
  Filled 2020-01-06: qty 30

## 2020-01-06 MED ORDER — SODIUM CHLORIDE 0.9 % IV SOLN: INTRAVENOUS | Status: AC

## 2020-01-06 MED ORDER — CEFAZOLIN SODIUM-DEXTROSE 2-4 GM/100ML-% IV SOLN
INTRAVENOUS | Status: AC
  Administered 2020-01-06: 13:00:00 2 g via INTRAVENOUS
  Filled 2020-01-06: qty 100

## 2020-01-06 MED ORDER — HYDROMORPHONE HCL 1 MG/ML IJ SOLN
INTRAMUSCULAR | Status: AC
  Filled 2020-01-06: qty 1

## 2020-01-06 MED ORDER — CEFAZOLIN SODIUM-DEXTROSE 2-4 GM/100ML-% IV SOLN: 2.0000 g | Freq: Once | INTRAVENOUS | Status: AC

## 2020-01-06 MED ORDER — HYDROMORPHONE HCL 1 MG/ML IJ SOLN
INTRAMUSCULAR | Status: AC | PRN
  Administered 2020-01-06: 1 mg via INTRAVENOUS

## 2020-01-06 MED ORDER — MIDAZOLAM HCL 2 MG/2ML IJ SOLN
INTRAMUSCULAR | Status: AC | PRN
  Administered 2020-01-06: 0.5 mg via INTRAVENOUS
  Administered 2020-01-06: 1 mg via INTRAVENOUS

## 2020-01-06 MED ORDER — MIDAZOLAM HCL 2 MG/2ML IJ SOLN
INTRAMUSCULAR | Status: AC
  Filled 2020-01-06: qty 2

## 2020-01-06 MED ORDER — IOHEXOL 300 MG/ML  SOLN
50.0000 mL | Freq: Once | INTRAMUSCULAR | Status: AC | PRN
  Administered 2020-01-06: 20 mL

## 2020-01-06 MED ORDER — FENTANYL CITRATE (PF) 100 MCG/2ML IJ SOLN
INTRAMUSCULAR | Status: AC | PRN
  Administered 2020-01-06 (×2): 25 ug via INTRAVENOUS

## 2020-01-06 MED ORDER — FENTANYL CITRATE (PF) 100 MCG/2ML IJ SOLN
INTRAMUSCULAR | Status: AC
  Filled 2020-01-06: qty 2

## 2020-01-06 MED ORDER — TOBRAMYCIN SULFATE 1.2 G IJ SOLR
INTRAMUSCULAR | Status: AC
  Filled 2020-01-06: qty 1.2

## 2020-01-06 NOTE — Consult Note (Signed)
Consultation Note Date: 01/06/2020   Patient Name: Amy Weiss  DOB: Nov 21, 1934  MRN: [id number]  Age / Sex: 85 y.o., female  PCP: [name], MD Referring Physician: Hollice Espy, MD  Reason for Consultation:  GOCs and emotional support  HPI/Patient Profile: 85 y.o. female  admitted on 01/02/2020 with past medical history of dementia, hypothyroidism and recent fall leading to compression fracture of the T12/L1 vertebral area status post kyphoplasty.  Patient was discharged home with home health and soon after discharge, sustained a mechanical fall and had new back pain.  She was home for less than 24 hours. Patient was arranged as a direct admission and lumbar MRI noted no new findings at previous vertebroplasty area, but no seen fracture at L2.  Case was discussed with neurosurgery who recommended patient be evaluated by interventional radiology who had done previous kyphoplasty.  Patient seen by interventional radiology, anticipates kyphoplasty today.  Daughter is at bedside.  She describes a continued physical, functional and cognitive decline over the last many weeks.  Patient and family face treatment option decisions, advanced directive decisions and anticipatory care needs.  Clinical Assessment and Goals of Care:   This NP [name] reviewed medical records, received report from team, assessed the patient and then meet at the patient's bedside along with her daughter/[name]  to discuss diagnosis, prognosis, GOC, EOL wishes disposition and options.  Concept of Hospice and Palliative Care were discussed  Created space and opportunity for patient and daughter to explore their thoughts and feelings regarding current medical situation.  The patient herself has little to add to the conversation, she allows her daughter to tell her story and answer all questions and expressing concerns.  Family  is finding it more difficult to meet the patient's care needs at home.  All anticipate need for short-term rehab on discharge from the hospital.  Patient agrees but this is certainly not her first choice, she prefer to go directly home.  A discussion was had today regarding advanced directives.  Concepts specific to code status, artifical feeding and hydration, continued IV antibiotics and rehospitalization was had.  The difference between a aggressive medical intervention path  and a palliative comfort care path for this patient at this time was had.  Values and goals of care important to patient and family were attempted to be elicited.  MOST form introduced and a hard choices booklet was left for review.   Questions and concerns addressed.   Family encouraged to call with questions or concerns.    PMT will continue to support holistically.     No documented HPOA or AD   SUMMARY OF RECOMMENDATIONS    Code Status/Advance Care Planning:  DNR/DNI   Palliative Prophylaxis:   Aspiration, Bowel Regimen, Delirium Protocol, Frequent Pain Assessment and Oral Care  Additional Recommendations (Limitations, Scope, Preferences):  Full Scope Treatment   No desire for artificial feeding or hydration now or in the future  Psycho-social/Spiritual:   Desire for further Chaplaincy support:no  Additional Recommendations: Education  on Hospice  Prognosis:   Unable to determine  Discharge Planning: To Be Determined      Primary Diagnoses: Present on Admission: . Closed compression fracture of L2 lumbar vertebra, initial encounter (HCC) . Bradycardia . (Resolved) Pre-diabetes . Senile dementia uncomp, without behavioral disturbance (HCC) . Hypothyroid   I have reviewed the medical record, interviewed the patient and family, and examined the patient. The following aspects are pertinent.  Past Medical History:  Diagnosis Date  . Diabetes mellitus 4/09   A1C-6  . Familial tremor     . Hyperlipidemia   . Hypothyroidism   . Osteoporosis   . Tachycardia    AV Re-entry, s/p ablation aprox 2005  . TIA (transient ischemic attack) 12/09   Social History   Socioeconomic History  . Marital status: Divorced    Spouse name: Not on file  . Number of children: 4  . Years of education: Not on file  . Highest education level: Not on file  Occupational History  . 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 Smoking status: Former Smoker    Quit date: 06/20/2012    Years since quitting: 7.5  . Smokeless tobacco: Never Used  Substance and Sexual Activity  . Alcohol use: Yes    Comment: beer rarely  . Drug use: No  . Sexual activity: Never  Other Topics Concern  . Not on file  Social History Narrative   Single, live by herself, lost a daughter in law   [name] lives close by   Ex- husband lives near   Social Determinants of Health   Financial Resource Strain:   . Difficulty of Paying Living Expenses: Not on file  Food Insecurity:   . Worried About Programme researcher, broadcasting/film/video in the Last Year: Not on file  . Ran Out of Food in the Last Year: Not on file  Transportation Needs:   . Lack of Transportation (Medical): Not on file  . Lack of Transportation (Non-Medical): Not on file  Physical Activity:   . Days of Exercise per Week: Not on file  . Minutes of Exercise per Session: Not on file  Stress:   . Feeling of Stress : Not on file  Social Connections:   . Frequency of Communication with Friends and Family: Not on file  . Frequency of Social Gatherings with Friends and Family: Not on file  . Attends Religious Services: Not on file  . Active Member of Clubs or Organizations: Not on file  . Attends Banker Meetings: Not on file  . Marital Status: Not on file   Family History  Problem Relation Age of Onset  . Pancreatic cancer Brother   . Pancreatic cancer Father   . Coronary artery disease Mother 3  . Breast cancer Neg Hx    . Colon cancer Neg Hx    Scheduled Meds: . aspirin EC  81 mg Oral Daily  . levothyroxine  88 mcg Oral QAC breakfast  . multivitamin with minerals  1 tablet Oral Daily  . senna-docusate  2 tablet Oral QHS  . Vitamin D (Ergocalciferol)  50,000 Units Oral Q7 days   Continuous Infusions: . sodium chloride 50 mL/hr at 01/05/20 1551   PRN Meds:.HYDROcodone-acetaminophen, polyethylene glycol, senna Medications Prior to Admission:  Prior to Admission medications   Medication Sig Start Date End Date Taking? Authorizing Provider  aspirin EC 81 MG tablet Take 81 mg by mouth daily.   Yes [provider]  cyclobenzaprine (FLEXERIL) 10 MG tablet Take 5 mg by mouth 2 (two) times daily as needed for muscle spasms.  01/02/20  Yes Paz, Alda Berthold, MD  docusate sodium (COLACE) 100 MG capsule Take 100 mg by mouth every 6 (six) hours as needed (with each dose of Vicodin).   Yes [provider]  hydrocortisone 2.5 % cream Apply topically 2 (two) times daily. Patient taking differently: Apply 1 application topically 2 (two) times daily as needed (itching legs).  12/15/19  Yes Colon Branch, MD  levothyroxine (SYNTHROID) 88 MCG tablet Take 1 tablet (88 mcg total) by mouth daily before breakfast. 08/11/19  Yes Paz, Jacqulyn Bath E, MD  lidocaine (LIDODERM) 5 % Place 1 patch onto the skin daily for 6 days. Remove & Discard patch within 12 hours or as directed by MD Patient taking differently: Place 1 patch onto the skin daily as needed (pain). Remove & Discard patch within 12 hours or as directed by MD 01/01/20 01/07/20 Yes Barb Merino, MD  OVER THE COUNTER MEDICATION Apply 1 application topically 5 (five) times daily as needed (back pain). "Comfrey Salve"   Yes [provider]  polyethylene glycol (MIRALAX / GLYCOLAX) 17 g packet Take 17 g by mouth daily as needed (constipation).    Yes [provider]  senna (SENOKOT) 8.6 MG tablet Take 1 tablet by mouth daily as needed for constipation.     Yes [provider]   Allergies  Allergen Reactions  . Sulfonamide Derivatives Other (See Comments)    Skin irritation   Review of Systems  Unable to perform ROS: Mental status change    Physical Exam Constitutional:      Appearance: She is normal weight. She is ill-appearing.  Cardiovascular:     Rate and Rhythm: Normal rate.  Pulmonary:     Effort: Pulmonary effort is normal.     Breath sounds: Normal breath sounds.  Skin:    General: Skin is warm and dry.  Neurological:     Mental Status: She is alert.  Psychiatric:        Cognition and Memory: Cognition is impaired.     Vital Signs: BP (!) 165/67 (BP Location: Right Arm)   Pulse 86   Temp 98.2 F (36.8 C) (Oral)   Resp 17   Ht 5\' 7"  (1.702 m)   Wt 62.1 kg   SpO2 100%   BMI 21.46 kg/m  Pain Scale: (Procedural Areas Only) Assessment interferes with procedure   Pain Score: 0-No pain   SpO2: SpO2: 100 % O2 Device:SpO2: 100 % O2 Flow Rate: .O2 Flow Rate (L/min): 2 L/min  IO: Intake/output summary:   Intake/Output Summary (Last 24 hours) at 01/06/2020 1934 Last data filed at 01/06/2020 1700 Gross per 24 hour  Intake 220 ml  Output 700 ml  Net -480 ml    LBM: Last BM Date: 01/04/20 Baseline Weight: Weight: 62.1 kg Most recent weight: Weight: 62.1 kg     Palliative Assessment/Data:  30 %   Discussed with attending  Time In: 0900 Time Out: 1010 Time Total: 70 minutes Greater than 50%  of this time was spent counseling and coordinating care related to the above assessment and plan.  Signed by: Wadie Lessen, NP   Please contact Palliative Medicine Team phone at [phone] for questions and concerns.  For individual provider: See Shea Evans

## 2020-01-06 NOTE — Procedures (Signed)
S/P L2 balloon KP . °S.Maricus Tanzi MD °

## 2020-01-06 NOTE — Discharge Instructions (Signed)
1.No stooping,bending, or lifting more than 10 lbs for 2 weeks. 2.Use walker to ambulate for 2 weeks. 3.No driving for 2 weeks. 4.See PRN 2 weeks.

## 2020-01-06 NOTE — Plan of Care (Signed)
  Problem: Pain Management: Goal: Pain level will decrease Outcome: Progressing   

## 2020-01-06 NOTE — Progress Notes (Signed)
PROGRESS NOTE  JAMIE-LEE GALDAMEZ MWU:[id number] DOB: 1935/07/11 DOA: 01/02/2020 PCP: [name], MD  HPI/Recap of past 24 hours: Patient is an 85 year old female with past medical history of dementia, hypothyroidism and recent fall leading to compression fracture of the T12/L1 vertebral area status post kyphoplasty.  Patient was discharged home with home health and soon after discharge, sustained a mechanical fall and had new back pain.  Patient was arranged as a direct admission and lumbar MRI noted no new findings at previous vertebroplasty area, but no seen fracture at L2.  Case was discussed with neurosurgery who recommended patient be evaluated by interventional radiology who had done previous kyphoplasty.  Patient seen by interventional radiology I & D after insurance approval, patient was taken for kyphoplasty on 2/16.  Patient seen this morning prior to kyphoplasty.  No complaints.  No pain currently.  Slept okay.  She has had during her hospital stay episodes of bradycardia with heart rate down into the high 30s.  This is a chronic issue for this patient and as before, she is asymptomatic from this.  Assessment/Plan: Principal Problem:   Closed compression fracture of L2 lumbar vertebra, initial encounter [facility]): Appreciate interventional radiology.  Plan for kyphoplasty tomorrow.  Patient to be seen by PT and OT after. Active Problems:   Bradycardia: At times, patient's blood pressure diastolic goes slightly low into the 40s and she gets a little bradycardic with a heart rate in the 40s.  Asymptomatic.  She is not on any chronotropic agents and her TSH is normal.  Chronic problem.  Episodes of hypoglycemia.: A1c about a year ago at 6.0.  A1c this hospitalization is 5.7.  Will resolve diagnosis of prediabetes from her problem list  Dementia: Mild, relatively functional.  Patient lives alone.  See below.  Hypothyroidism: Continue Synthroid  Code Status: DNR  Family Communication:  Daughter at the bedside  Disposition Plan: Following vertebroplasty, will have PT and OT see patient.  Patient lives alone and family is having a difficult financial time with resources to have 24/7 nursing care even if patient stays with one of her children.  They are hoping that PT/OT will feel recommend inpatient rehab.  Not sure that patient will recover enough to the point where she will not be a fall risk   Consultants:  Discussed with neurosurgery  Interventional radiology  Procedures:  Planned kyphoplasty 2/16  Antimicrobials:  None  DVT prophylaxis: Lovenox stopped before 2/15 dose. Changed to SCDs   Objective: Vitals:   01/06/20 1315 01/06/20 1320  BP: (!) 166/98 (!) 137/104  Pulse: 60 (!) 52  Resp: 17 16  Temp:    SpO2: 100% 100%    Intake/Output Summary (Last 24 hours) at 01/06/2020 1332 Last data filed at 01/05/2020 1829 Gross per 24 hour  Intake 65.89 ml  Output 350 ml  Net -284.11 ml   Filed Weights   01/03/20 1408  Weight: 62.1 kg   Body mass index is 21.46 kg/m.  Exam:   General: Alert and oriented x2, no acute distress, easily forgetful  HEENT: Normocephalic and atraumatic, mucous membranes slightly dry  Cardiovascular: Regular rhythm, S1-S2, bradycardic  Respiratory: Clear to auscultation bilaterally  Abdomen: Soft, nontender, nondistended, positive bowel sounds  Musculoskeletal: No clubbing or cyanosis or edema  Skin: No skin breaks, tears or lesions  Psychiatry: Appropriate, no evidence of psychoses   Data Reviewed: CBC: Recent Labs  Lab 01/02/20 1931 01/03/20 0239 01/04/20 0519 01/05/20 0916 01/06/20 0242  WBC 7.1  6.1 6.8 5.8 6.1  HGB 13.6 14.0 13.4 14.7 13.2  HCT 41.6 41.9 40.6 44.8 39.9  MCV 91.4 90.1 91.6 92.0 90.9  PLT 271 257 246 246 194   Basic Metabolic Panel: Recent Labs  Lab 01/02/20 1931 01/03/20 0239 01/04/20 0519 01/05/20 0916 01/06/20 0242  NA  --  138 138 138 138  K  --  4.2 4.4 4.7 4.3  CL   --  107 107 104 108  CO2  --  23 23 24 23   GLUCOSE  --  109* 101* 91 95  BUN  --  11 11 12 13   CREATININE 0.67 0.58 0.74 0.71 0.66  CALCIUM  --  8.9 8.6* 8.9 8.7*  MG  --  2.0 2.0 2.1 1.9   GFR: Estimated Creatinine Clearance: 52.7 mL/min (by C-G formula based on SCr of 0.66 mg/dL). Liver Function Tests: No results for input(s): AST, ALT, ALKPHOS, BILITOT, PROT, ALBUMIN in the last 168 hours. No results for input(s): LIPASE, AMYLASE in the last 168 hours. No results for input(s): AMMONIA in the last 168 hours. Coagulation Profile: Recent Labs  Lab 01/05/20 0916  INR 1.0   Cardiac Enzymes: No results for input(s): CKTOTAL, CKMB, CKMBINDEX, TROPONINI in the last 168 hours. BNP (last 3 results) No results for input(s): PROBNP in the last 8760 hours. HbA1C: Recent Labs    01/05/20 1410  HGBA1C 5.7*   CBG: No results for input(s): GLUCAP in the last 168 hours. Lipid Profile: No results for input(s): CHOL, HDL, LDLCALC, TRIG, CHOLHDL, LDLDIRECT in the last 72 hours. Thyroid Function Tests: Recent Labs    01/04/20 0519  TSH 2.139   Anemia Panel: No results for input(s): VITAMINB12, FOLATE, FERRITIN, TIBC, IRON, RETICCTPCT in the last 72 hours. Urine analysis:    Component Value Date/Time   COLORURINE YELLOW 12/26/2019 0926   APPEARANCEUR CLEAR 12/26/2019 0926   LABSPEC 1.011 12/26/2019 0926   PHURINE 6.0 12/26/2019 0926   GLUCOSEU NEGATIVE 12/26/2019 0926   GLUCOSEU NEGATIVE 10/13/2014 1615   HGBUR NEGATIVE 12/26/2019 0926   BILIRUBINUR NEGATIVE 12/26/2019 0926   KETONESUR NEGATIVE 12/26/2019 0926   PROTEINUR NEGATIVE 12/26/2019 0926   UROBILINOGEN 0.2 10/13/2014 1615   NITRITE NEGATIVE 12/26/2019 0926   LEUKOCYTESUR SMALL (A) 12/26/2019 0926   Sepsis Labs: @LABRCNTIP (procalcitonin:4,lacticidven:4)  ) Recent Results (from the past 240 hour(s))  SARS CORONAVIRUS 2 (TAT 6-24 HRS) Nasopharyngeal Nasopharyngeal Swab     Status: None   Collection Time: 01/03/20   1:25 PM   Specimen: Nasopharyngeal Swab  Result Value Ref Range Status   SARS Coronavirus 2 NEGATIVE NEGATIVE Final    Comment: (NOTE) SARS-CoV-2 target nucleic acids are NOT DETECTED. The SARS-CoV-2 RNA is generally detectable in upper and lower respiratory specimens during the acute phase of infection. Negative results do not preclude SARS-CoV-2 infection, do not rule out co-infections with other pathogens, and should not be used as the sole basis for treatment or other patient management decisions. Negative results must be combined with clinical observations, patient history, and epidemiological information. The expected result is Negative. Fact Sheet for Patients: SugarRoll.be Fact Sheet for Healthcare Providers: https://www.woods-mathews.com/ This test is not yet approved or cleared by the [place] FDA and  has been authorized for detection and/or diagnosis of SARS-CoV-2 by FDA under an Emergency Use Authorization (EUA). This EUA will remain  in effect (meaning this test can be used) for the duration of the COVID-19 declaration under Section 56 4(b)(1) of the Act, 21 U.S.C. section 360bbb-3(b)(1),  unless the authorization is terminated or revoked sooner. Performed at [facility], [address], [place], [place] [address]       Studies: No results found.  Scheduled Meds: . aspirin EC  81 mg Oral Daily  . fentaNYL      . HYDROmorphone      . levothyroxine  88 mcg Oral QAC breakfast  . midazolam      . multivitamin with minerals  1 tablet Oral Daily  . senna-docusate  2 tablet Oral QHS  . Vitamin D (Ergocalciferol)  50,000 Units Oral Q7 days    Continuous Infusions: . sodium chloride 50 mL/hr at 01/05/20 1551     LOS: 3 days     Hollice Espy, MD [facility]   01/06/2020, 1:32 PM

## 2020-01-07 DIAGNOSIS — Z66 Do not resuscitate: Secondary | ICD-10-CM

## 2020-01-07 DIAGNOSIS — R4189 Other symptoms and signs involving cognitive functions and awareness: Secondary | ICD-10-CM

## 2020-01-07 DIAGNOSIS — R531 Weakness: Secondary | ICD-10-CM

## 2020-01-07 LAB — CBC
HCT: 40.9 % (ref 36.0–46.0)
Hemoglobin: 13.5 g/dL (ref 12.0–15.0)
MCH: 30.1 pg (ref 26.0–34.0)
MCHC: 33 g/dL (ref 30.0–36.0)
MCV: 91.3 fL (ref 80.0–100.0)
Platelets: 239 10*3/uL (ref 150–400)
RBC: 4.48 MIL/uL (ref 3.87–5.11)
RDW: 15 % (ref 11.5–15.5)
WBC: 6 10*3/uL (ref 4.0–10.5)
nRBC: 0 % (ref 0.0–0.2)

## 2020-01-07 LAB — BASIC METABOLIC PANEL
Anion gap: 10 (ref 5–15)
BUN: 11 mg/dL (ref 8–23)
CO2: 25 mmol/L (ref 22–32)
Calcium: 9 mg/dL (ref 8.9–10.3)
Chloride: 104 mmol/L (ref 98–111)
Creatinine, Ser: 0.71 mg/dL (ref 0.44–1.00)
GFR calc Af Amer: >60 mL/min (ref >60)
GFR calc non Af Amer: >60 mL/min (ref >60)
Glucose, Bld: 89 mg/dL (ref 70–99)
Potassium: 4.5 mmol/L (ref 3.5–5.1)
Sodium: 139 mmol/L (ref 135–145)

## 2020-01-07 LAB — MAGNESIUM: Magnesium: 2 mg/dL (ref 1.7–2.4)

## 2020-01-07 MED ORDER — HYDROCODONE-ACETAMINOPHEN 5-325 MG PO TABS
1.0000 | ORAL_TABLET | ORAL | Status: DC | PRN
  Administered 2020-01-07: 2 via ORAL
  Administered 2020-01-07: 1 via ORAL
  Administered 2020-01-07: 2 via ORAL
  Administered 2020-01-08: 1 via ORAL
  Administered 2020-01-08: 2 via ORAL
  Administered 2020-01-08: 1 via ORAL
  Administered 2020-01-08 – 2020-01-09 (×2): 2 via ORAL
  Administered 2020-01-09: 1 via ORAL
  Filled 2020-01-07: qty 1
  Filled 2020-01-07: qty 2
  Filled 2020-01-07: qty 1
  Filled 2020-01-07 (×2): qty 2
  Filled 2020-01-07: qty 1
  Filled 2020-01-07 (×2): qty 2
  Filled 2020-01-07: qty 1

## 2020-01-07 NOTE — Evaluation (Signed)
Physical Therapy Evaluation Patient Details Name: Amy Weiss MRN: [id number] DOB: June 30, 1935 Today's Date: 01/07/2020   History of Present Illness  Patient is an 85 year old female with past medical history of dementia, hypothyroidism and recent fall leading to compression fracture of the T12/L1 vertebral area status post kyphoplasty.  Patient was discharged home with home health and soon after discharge, sustained a mechanical fall and had new back pain.  Patient was arranged as a direct admission and lumbar MRI noted no new findings at previous vertebroplasty area, but new fracture at L2.  Now s/p kyphoplasty on 2/16  Clinical Impression   Pt admitted with above diagnosis. Prior to initial fall earlier this month, Amy Weiss was independent; Noted difficulty managing at home after last admission, and another fall, resulting in current admission;  Presents to PT with back pain limiting activity tolerance, and considerable functional dependencies as well as history of multiple falls; Pt currently with functional limitations due to the deficits listed below (see PT Problem List). Pt will benefit from skilled PT to increase their independence and safety with mobility to allow discharge to the venue listed below.    Given difficulty with transition home after last admission, I favor a post-acute rehab stay to maximize independence and safety with mobility prior to dc home.      Follow Up Recommendations CIR;Supervision/Assistance - 24 hour    Equipment Recommendations  Rolling walker with 5" wheels;3in1 (PT)(may already hav3e)    Recommendations for Other Services Rehab consult     Precautions / Restrictions Precautions Precautions: Fall;Back Precaution Comments: educated on log rolling Required Braces or Orthoses: (No brace ordered for current admission) Spinal Brace: (No brace ordered for current admission) Restrictions Weight Bearing Restrictions: No      Mobility  Bed  Mobility Overal bed mobility: Needs Assistance Bed Mobility: Rolling Rolling: Mod assist         General bed mobility comments: Verbal and tactile cueing for log roll technique; Light mod assist to assist knees and cues to use rails in Rolling R and L for changing of bed pads; Pt opted to finish lying on her back; Put the bed in semi-chair position  Transfers                    Ambulation/Gait                Stairs            Wheelchair Mobility    Modified Rankin (Stroke Patients Only)       Balance                                             Pertinent Vitals/Pain Pain Assessment: Faces Faces Pain Scale: Hurts even more Pain Location: back pain with bed mobility adn repositioning Pain Descriptors / Indicators: Grimacing;Guarding Pain Intervention(s): Monitored during session;Premedicated before session;Repositioned    Home Living Family/patient expects to be discharged to:: Private residence Living Arrangements: Alone Available Help at Discharge: Family;Friend(s);Available PRN/intermittently(I believe they have at or near 24 hour assist) Type of Home: House Home Access: Stairs to enter Entrance Stairs-Rails: None Entrance Stairs-Number of Steps: 1 Home Layout: One level Home Equipment: Walker - 2 wheels      Prior Function Level of Independence: Independent(prior to initial fall early Feb)         Comments: Driving  Hand Dominance   Dominant Hand: Left    Extremity/Trunk Assessment   Upper Extremity Assessment Upper Extremity Assessment: Defer to OT evaluation    Lower Extremity Assessment Lower Extremity Assessment: Generalized weakness(incr back pain with R and L LE movement in teh bed)    Cervical / Trunk Assessment Cervical / Trunk Assessment: Other exceptions Cervical / Trunk Exceptions: Prev L1 compression fx, new L2 compression fx, kyphoplasties for both  Communication   Communication: No  difficulties  Cognition Arousal/Alertness: Awake/alert Behavior During Therapy: WFL for tasks assessed/performed;Anxious Overall Cognitive Status: History of cognitive impairments - at baseline Area of Impairment: Memory;Safety/judgement;Problem solving                     Memory: Decreased short-term memory;Decreased recall of precautions         General Comments: Described kyphoplasty procedure at her 3 times during 30 minute session; noted history of dementia      General Comments      Exercises     Assessment/Plan    PT Assessment Patient needs continued PT services  PT Problem List Decreased strength;Decreased activity tolerance;Decreased balance;Decreased mobility;Decreased coordination;Decreased knowledge of use of DME;Decreased safety awareness;Decreased knowledge of precautions;Pain;Decreased cognition       PT Treatment Interventions DME instruction;Gait training;Stair training;Therapeutic activities;Functional mobility training;Balance training;Therapeutic exercise;Neuromuscular re-education;Patient/family education    PT Goals (Current goals can be found in the Care Plan section)  Acute Rehab PT Goals Patient Stated Goal: To stay in bed; agreed to repositioning PT Goal Formulation: With patient/family Time For Goal Achievement: 01/21/20 Potential to Achieve Goals: Fair    Frequency Min 3X/week   Barriers to discharge Other (comment) Noted difficulty managing at home since initial recent fall    Co-evaluation               AM-PAC PT "6 Clicks" Mobility  Outcome Measure Help needed turning from your back to your side while in a flat bed without using bedrails?: A Lot Help needed moving from lying on your back to sitting on the side of a flat bed without using bedrails?: A Lot Help needed moving to and from a bed to a chair (including a wheelchair)?: A Lot Help needed standing up from a chair using your arms (e.g., wheelchair or bedside  chair)?: A Lot Help needed to walk in hospital room?: A Lot Help needed climbing 3-5 steps with a railing? : Total 6 Click Score: 11    End of Session   Activity Tolerance: Patient limited by pain Patient left: in bed;with call bell/phone within reach;with bed alarm set Nurse Communication: Mobility status PT Visit Diagnosis: Other abnormalities of gait and mobility (R26.89);Pain Pain - part of body: (back)    Time: 4166-0630 PT Time Calculation (min) (ACUTE ONLY): 26 min   Charges:   PT Evaluation $PT Eval Moderate Complexity: 1 Mod PT Treatments $Therapeutic Activity: 8-22 mins        Van Clines, PT  Acute Rehabilitation Services Pager [phone] Office [phone]   Levi Aland 01/07/2020, 11:57 AM

## 2020-01-07 NOTE — Progress Notes (Signed)
PROGRESS NOTE    Amy Weiss  MEB:[id number] DOB: [birth year] DOA: 01/02/2020 PCP: [name], MD   Brief Narrative:  Patient is an 85 year old female with past medical history of dementia, hypothyroidism and recent fall leading to compression fracture of the T12/L1 vertebral area status post kyphoplasty.  Patient was discharged home with home health and soon after discharge, sustained a mechanical fall and had new back pain.  Patient was arranged as a direct admission and lumbar MRI noted no new findings at previous vertebroplasty area, but no seen fracture at L2.  Case was discussed with neurosurgery who recommended patient be evaluated by interventional radiology who had done previous kyphoplasty.  Patient seen by interventional radiology I & D after insurance approval, patient was taken for kyphoplasty on 2/16.  Subjective: Patient continued to have some pain when seen this morning.  Stating 7 /10, nonradiating.  No focal deficit.  She was accompanied by her daughter in the room who had a lot of questions regarding getting physical therapy and moving forward after procedure.  Assessment & Plan:   Principal Problem:   Closed compression fracture of L2 lumbar vertebra, initial encounter (HCC) Active Problems:   Bradycardia   Senile dementia uncomp, without behavioral disturbance (HCC)   Hypothyroid  Closed compression fracture of L2 lumbar vertebra, initial encounter [facility]): She underwent kyphoplasty with interventional radiology yesterday. Rated the procedure well.  Still having some postsurgical pain. -PT/OT are recommending CIR. -Continue pain management.  Chronic asymptomatic bradycardia.  At times patient heart rate dips in high 30s.  According to daughter her normal heart rate is between 31-37.  Patient remained completely asymptomatic.  She is not on any nodal blocking agents and TSH is normal. -Continue to monitor.  Episodes of hypoglycemia.: A1c about a year ago at 6.0.   A1c this hospitalization is 5.7.  Will resolve diagnosis of prediabetes from her problem list  Dementia: Mild, relatively functional.  Patient lives alone.  Hypothyroidism: Continue Synthroid  Objective: Vitals:   01/06/20 1330 01/06/20 1557 01/06/20 1932 01/07/20 0550  BP: (!) 177/77 (!) 165/67 133/69 (!) 147/52  Pulse: (!) 42 86 (!) 46 (!) 43  Resp: 16 17 16    Temp:  98.2 F (36.8 C) 99.4 F (37.4 C) 98.4 F (36.9 C)  TempSrc:  Oral Oral Oral  SpO2: 100% 100% 97% 99%  Weight:      Height:        Intake/Output Summary (Last 24 hours) at 01/07/2020 0818 Last data filed at 01/06/2020 1700 Gross per 24 hour  Intake 220 ml  Output 700 ml  Net -480 ml   Filed Weights   01/03/20 1408  Weight: 62.1 kg    Examination:  General exam: Appears calm and comfortable  Respiratory system: Clear to auscultation. Respiratory effort normal. Cardiovascular system: S1 & S2 heard, RRR. No JVD, murmurs, rubs, gallops or clicks. Gastrointestinal system: Soft, nontender, nondistended, bowel sounds positive. Central nervous system: Alert and oriented. No focal neurological deficits. Extremities: No edema, no cyanosis, pulses intact and symmetrical. Skin: No rashes, lesions or ulcers Psychiatry: Judgement and insight appear normal. Mood & affect appropriate.    DVT prophylaxis: Lovenox Code Status: DNR Family Communication: Daughter was updated at bedside. Disposition Plan: Pending improvement, most likely will go to CIR before going home if no barriers from insurance.  Consultants:   IR  Discussed with neurosurgery  Procedures:  Kyphoplasty on 01/06/2020  Antimicrobials:   Data Reviewed: I have personally reviewed following labs and  imaging studies  CBC: Recent Labs  Lab 01/03/20 0239 01/04/20 0519 01/05/20 0916 01/06/20 0242 01/07/20 0302  WBC 6.1 6.8 5.8 6.1 6.0  HGB 14.0 13.4 14.7 13.2 13.5  HCT 41.9 40.6 44.8 39.9 40.9  MCV 90.1 91.6 92.0 90.9 91.3  PLT 257 246  246 233 239   Basic Metabolic Panel: Recent Labs  Lab 01/03/20 0239 01/04/20 0519 01/05/20 0916 01/06/20 0242 01/07/20 0302  NA 138 138 138 138 139  K 4.2 4.4 4.7 4.3 4.5  CL 107 107 104 108 104  CO2 23 23 24 23 25   GLUCOSE 109* 101* 91 95 89  BUN 11 11 12 13 11   CREATININE 0.58 0.74 0.71 0.66 0.71  CALCIUM 8.9 8.6* 8.9 8.7* 9.0  MG 2.0 2.0 2.1 1.9 2.0   GFR: Estimated Creatinine Clearance: 52.7 mL/min (by C-G formula based on SCr of 0.71 mg/dL). Liver Function Tests: No results for input(s): AST, ALT, ALKPHOS, BILITOT, PROT, ALBUMIN in the last 168 hours. No results for input(s): LIPASE, AMYLASE in the last 168 hours. No results for input(s): AMMONIA in the last 168 hours. Coagulation Profile: Recent Labs  Lab 01/05/20 0916  INR 1.0   Cardiac Enzymes: No results for input(s): CKTOTAL, CKMB, CKMBINDEX, TROPONINI in the last 168 hours. BNP (last 3 results) No results for input(s): PROBNP in the last 8760 hours. HbA1C: Recent Labs    01/05/20 1410  HGBA1C 5.7*   CBG: No results for input(s): GLUCAP in the last 168 hours. Lipid Profile: No results for input(s): CHOL, HDL, LDLCALC, TRIG, CHOLHDL, LDLDIRECT in the last 72 hours. Thyroid Function Tests: No results for input(s): TSH, T4TOTAL, FREET4, T3FREE, THYROIDAB in the last 72 hours. Anemia Panel: No results for input(s): VITAMINB12, FOLATE, FERRITIN, TIBC, IRON, RETICCTPCT in the last 72 hours. Sepsis Labs: No results for input(s): PROCALCITON, LATICACIDVEN in the last 168 hours.  Recent Results (from the past 240 hour(s))  SARS CORONAVIRUS 2 (TAT 6-24 HRS) Nasopharyngeal Nasopharyngeal Swab     Status: None   Collection Time: 01/03/20  1:25 PM   Specimen: Nasopharyngeal Swab  Result Value Ref Range Status   SARS Coronavirus 2 NEGATIVE NEGATIVE Final    Comment: (NOTE) SARS-CoV-2 target nucleic acids are NOT DETECTED. The SARS-CoV-2 RNA is generally detectable in upper and lower respiratory specimens  during the acute phase of infection. Negative results do not preclude SARS-CoV-2 infection, do not rule out co-infections with other pathogens, and should not be used as the sole basis for treatment or other patient management decisions. Negative results must be combined with clinical observations, patient history, and epidemiological information. The expected result is Negative. Fact Sheet for Patients: 01/07/20 Fact Sheet for Healthcare Providers: 01/05/20 This test is not yet approved or cleared by the [place] FDA and  has been authorized for detection and/or diagnosis of SARS-CoV-2 by FDA under an Emergency Use Authorization (EUA). This EUA will remain  in effect (meaning this test can be used) for the duration of the COVID-19 declaration under Section 56 4(b)(1) of the Act, 21 U.S.C. section 360bbb-3(b)(1), unless the authorization is terminated or revoked sooner. Performed at [facility], [address], [place], [address] [place]      Radiology Studies: No results found.  Scheduled Meds: . aspirin EC  81 mg Oral Daily  . levothyroxine  88 mcg Oral QAC breakfast  . multivitamin with minerals  1 tablet Oral Daily  . senna-docusate  2 tablet Oral QHS  . Vitamin D (  Ergocalciferol)  50,000 Units Oral Q7 days   Continuous Infusions: . sodium chloride 50 mL/hr at 01/05/20 1551     LOS: 4 days   Time spent: 40 minutes.  Lorella Nimrod, MD [facility]  If 7PM-7AM, please contact night-coverage [email or web address]  01/07/2020, 8:18 AM   This record has been created using Systems analyst. Errors have been sought and corrected,but may not always be located. Such creation errors do not reflect on the standard of care.

## 2020-01-07 NOTE — Evaluation (Signed)
Occupational Therapy Evaluation Patient Details Name: Amy Weiss MRN: [id number] DOB: 11-09-1935 Today's Date: 01/07/2020    History of Present Illness Patient is an 85 year old female with past medical history of dementia, hypothyroidism and recent fall leading to compression fracture of the T12/L1 vertebral area status post kyphoplasty.  Patient was discharged home with home health and soon after discharge, sustained a mechanical fall and had new back pain.  Patient was arranged as a direct admission and lumbar MRI noted no new findings at previous vertebroplasty area, but new fracture at L2.  Now s/p kyphoplasty on 2/16   Clinical Impression   Pt with history of multiple back sx. Prior to fall, Pt was independent. Currently with limited evaluation this session per family and patient request. Mod A for rolling at the bed level for pero care. Max A for rear peri care and set up for front peri care. Due to history of falls, and independent PLOF - at this time recommending CIR level rehab post-acute to maximize safety and independence in ADL and functional transfer.     Follow Up Recommendations  CIR;Supervision/Assistance - 24 hour    Equipment Recommendations  None recommended by OT(Pt has appropriate DME)    Recommendations for Other Services       Precautions / Restrictions Precautions Precautions: Fall;Back Precaution Comments: Reviewed verbally Required Braces or Orthoses: (No brace ordered for current admission) Spinal Brace: (No brace ordered for current admission) Restrictions Weight Bearing Restrictions: No      Mobility Bed Mobility Overal bed mobility: Needs Assistance Bed Mobility: Rolling Rolling: Mod assist         General bed mobility comments: Verbal and tactile cueing for log roll technique; Light mod assist to assist knees and cues to use rails in Rolling R and L for changing of bed pads; Pt opted to finish lying on her back; Put the bed in semi-chair  position  Transfers                 General transfer comment: deferred this session    Balance                                           ADL either performed or assessed with clinical judgement   ADL Overall ADL's : Needs assistance/impaired Eating/Feeding: Set up   Grooming: Wash/dry hands;Wash/dry face;Bed level;Set up   Upper Body Bathing: Maximal assistance;Bed level   Lower Body Bathing: Maximal assistance;Bed level   Upper Body Dressing : Moderate assistance;Bed level   Lower Body Dressing: Maximal assistance;Bed level   Toilet Transfer: Min guard(rolling bed level)   Toileting- Clothing Manipulation and Hygiene: Set up;Bed level Toileting - Clothing Manipulation Details (indicate cue type and reason): Pt able to complete own front peri care at bed level     Functional mobility during ADLs: (NT this session) General ADL Comments: Family and Patient declining OOB and requesting bed level evaluation today     Vision Baseline Vision/History: Wears glasses Wears Glasses: Reading only Patient Visual Report: No change from baseline Vision Assessment?: No apparent visual deficits     Perception     Praxis      Pertinent Vitals/Pain Pain Assessment: Faces Faces Pain Scale: Hurts even more Pain Location: back pain with bed mobility adn repositioning Pain Descriptors / Indicators: Grimacing;Guarding Pain Intervention(s): Monitored during session;Repositioned;Limited activity within patient's tolerance  Hand Dominance Left   Extremity/Trunk Assessment Upper Extremity Assessment Upper Extremity Assessment: Generalized weakness   Lower Extremity Assessment Lower Extremity Assessment: Defer to PT evaluation   Cervical / Trunk Assessment Cervical / Trunk Assessment: Other exceptions Cervical / Trunk Exceptions: Prev L1 compression fx, new L2 compression fx, kyphoplasties for both   Communication Communication Communication: No  difficulties   Cognition Arousal/Alertness: Awake/alert Behavior During Therapy: WFL for tasks assessed/performed;Anxious Overall Cognitive Status: History of cognitive impairments - at baseline Area of Impairment: Memory;Safety/judgement;Problem solving                     Memory: Decreased short-term memory;Decreased recall of precautions         General Comments: Conversational and appropriate but STM limited   General Comments       Exercises     Shoulder Instructions      Home Living Family/patient expects to be discharged to:: Private residence Living Arrangements: Alone Available Help at Discharge: Family;Friend(s);Available PRN/intermittently(I believe they have at or near 24 hour assist) Type of Home: House Home Access: Stairs to enter [employer] of Steps: 1 Entrance Stairs-Rails: None Home Layout: One level     Bathroom Shower/Tub: Tub/shower unit         Home Equipment: Walker - 2 wheels          Prior Functioning/Environment Level of Independence: Independent(prior to initial fall early Feb)        Comments: Driving        OT Problem List: Decreased range of motion;Decreased activity tolerance;Impaired balance (sitting and/or standing);Decreased knowledge of precautions;Pain      OT Treatment/Interventions: Self-care/ADL training;DME and/or AE instruction;Therapeutic activities;Patient/family education;Balance training    OT Goals(Current goals can be found in the care plan section) Acute Rehab OT Goals Patient Stated Goal: To stay in bed OT Goal Formulation: With patient/family Time For Goal Achievement: 01/21/20 Potential to Achieve Goals: Good ADL Goals Pt Will Perform Grooming: with set-up;sitting Pt Will Perform Upper Body Dressing: with supervision;sitting Pt Will Perform Lower Body Dressing: with supervision;sitting/lateral leans;with adaptive equipment Pt Will Transfer to Toilet: with supervision;ambulating Pt  Will Perform Toileting - Clothing Manipulation and hygiene: with supervision;sitting/lateral leans;sit to/from stand Additional ADL Goal #1: Pt will perform bed mobility at supervision level while maintaining back precautions prior to engaging in ADL  OT Frequency: Min 2X/week   Barriers to D/C:            Co-evaluation              AM-PAC OT "6 Clicks" Daily Activity     Outcome Measure Help from another person eating meals?: None Help from another person taking care of personal grooming?: A Little Help from another person toileting, which includes using toliet, bedpan, or urinal?: A Lot Help from another person bathing (including washing, rinsing, drying)?: A Lot Help from another person to put on and taking off regular upper body clothing?: A Little Help from another person to put on and taking off regular lower body clothing?: A Lot 6 Click Score: 16   End of Session Nurse Communication: Mobility status;Precautions  Activity Tolerance: Patient limited by pain;Other (comment) Patient left: in bed;with call bell/phone within reach;with bed alarm set;with family/visitor present  OT Visit Diagnosis: Muscle weakness (generalized) (M62.81);Other symptoms and signs involving cognitive function;Pain Pain - part of body: (spine)                Time: [id number] OT Time Calculation (min): 30  min Charges:  OT General Charges $OT Visit: 1 Visit OT Evaluation $OT Eval Moderate Complexity: 1 Mod OT Treatments $Self Care/Home Management : 8-22 mins  Jesse Sans OTR/L Acute Rehabilitation Services Pager: [phone] Office: [place] 01/07/2020, 1:05 PM

## 2020-01-07 NOTE — Progress Notes (Signed)
Patient ID: Amy Weiss, female   DOB: [birth year], 85 y.o.   MRN: [id number]  This NP visited patient at the bedside as a follow up to  yesterday's GOCs meeting and for palliative medicine needs and emotional support.  Patient's daughter is at bedside.  Patient has been doing well post kyphoplasty yesterday.  She remains intermittently confused, and her main focus is "when can I go home"  Continued conversation with daughter/Amy Weiss regarding current medical situation, underlying co-morbidities specific to her continued cognitive decline and overall fraility.    Amy Weiss has lots of questions and concerns regarding transitions of care options.  We discussed in detail hospice benefit versus palliative services both at home and in facility.  At this point in time family is hopeful that the patient may be eligible for rehab within the hospital with CIR.  Will await assessment.    I encouraged the daughter to consider the impact that cognition has on successful rehabilitation.  We also discussed the limited options of in-home care outside of out-of-pocket caregivers.  Patient had been seen by care connections prior to this admission.  Discussed with daughter the importance of continued conversation with all family members and the medical providers regarding overall plan of care and treatment options,  ensuring decisions are within the context of the patients values and GOCs.  Questions and concerns addressed   Discussed with Dr Nelson Chimes via secure chat  Total time spent on the unit was 35 minutes   Palliative medicine will continue to support holistically  Greater than 50% of the time was spent in counseling and coordination of care  [name] NP  Palliative Medicine Team Team Phone # [phone] Pager [phone]

## 2020-01-07 NOTE — Progress Notes (Signed)
Rehab Admissions Coordinator Note:  Per PT/OT recommendation, patient was screened by Stephania Fragmin for appropriateness for an Inpatient Acute Rehab Consult.  At this time, we are recommending Inpatient Rehab consult. Note that insurance authorization would be required for an inpatient rehab admission and this may be difficult with pt's insurance.   Stephania Fragmin 01/07/2020, 2:50 PM  I can be reached at [phone].

## 2020-01-07 NOTE — Plan of Care (Signed)
  Problem: Pain Management: Goal: Pain level will decrease Outcome: Progressing   

## 2020-01-07 NOTE — Progress Notes (Signed)
Dr. Nelson Chimes notified about pt's heart rate in the 30's. Pt asymptomatic, no complaints at this time. Pt and pt's daughter at bedside state 31-37 range is baseline. No new orders at this time.

## 2020-01-08 DIAGNOSIS — R531 Weakness: Secondary | ICD-10-CM

## 2020-01-08 NOTE — Progress Notes (Addendum)
Occupational Therapy Treatment Patient Details Name: Amy Weiss MRN: [id number] DOB: 1935/02/26 Today's Date: 01/08/2020    History of present illness Patient is an 85 year old female with past medical history of dementia, hypothyroidism and recent fall leading to compression fracture of the T12/L1 vertebral area status post kyphoplasty.  Patient was discharged home with home health and soon after discharge, sustained a mechanical fall and had new back pain.  Patient was arranged as a direct admission and lumbar MRI noted no new findings at previous vertebroplasty area, but new fracture at L2.  Now s/p kyphoplasty on 2/16   OT comments  Pain much improved with pt readily willing to get OOB and ambulated with RW and min guard assist in halls. Pt declined standing at sink for grooming, but performed with set up in chair. Pt is able to cross her foot over opposite knee to avoid bending with LB dressing. Cues needed for back precautions for comfort. Updated d/c recommendation to SNF at daughter's request.  Follow Up Recommendations  SNF;Supervision/Assistance - 24 hour    Equipment Recommendations  None recommended by OT    Recommendations for Other Services      Precautions / Restrictions Precautions Precautions: Fall;Back Precaution Comments: cueing needed for log roll technique Restrictions Weight Bearing Restrictions: No       Mobility Bed Mobility Overal bed mobility: Needs Assistance Bed Mobility: Rolling;Sidelying to Sit Rolling: Min guard Sidelying to sit: Min guard       General bed mobility comments: cues for technique, use of rail, no physical assist  Transfers Overall transfer level: Needs assistance Equipment used: Rolling walker (2 wheeled) Transfers: Sit to/from Stand Sit to Stand: Min guard         General transfer comment: increased time, min guard for safety; pt steady overall with transitional movement despite pain    Balance Overall balance  assessment: Needs assistance Sitting-balance support: Feet supported Sitting balance-Leahy Scale: Good     Standing balance support: Bilateral upper extremity supported;Single extremity supported Standing balance-Leahy Scale: Poor                             ADL either performed or assessed with clinical judgement   ADL Overall ADL's : Needs assistance/impaired     Grooming: Oral care;Brushing hair;Sitting;Set up           Upper Body Dressing : Minimal assistance;Sitting Upper Body Dressing Details (indicate cue type and reason): front opening gown   Lower Body Dressing Details (indicate cue type and reason): pt is able to cross her foot over opposite knee Toilet Transfer: Min guard;Ambulation;RW           Functional mobility during ADLs: Min guard;Rolling walker       Vision       Perception     Praxis      Cognition Arousal/Alertness: Awake/alert Behavior During Therapy: WFL for tasks assessed/performed;Restless Overall Cognitive Status: History of cognitive impairments - at baseline Area of Impairment: Memory;Safety/judgement;Problem solving                     Memory: Decreased short-term memory;Decreased recall of precautions   Safety/Judgement: Decreased awareness of deficits;Decreased awareness of safety   Problem Solving: Difficulty sequencing;Requires verbal cues          Exercises     Shoulder Instructions       General Comments      Pertinent Vitals/ Pain  Pain Assessment: Faces Faces Pain Scale: Hurts little more Pain Location: low back and posterior R hip Pain Descriptors / Indicators: Grimacing;Guarding Pain Intervention(s): Monitored during session;Limited activity within patient's tolerance  Home Living                                          Prior Functioning/Environment              Frequency  Min 2X/week        Progress Toward Goals  OT Goals(current goals can now  be found in the care plan section)  Progress towards OT goals: Progressing toward goals  Acute Rehab OT Goals Patient Stated Goal: readily willing to walk OT Goal Formulation: With patient/family Time For Goal Achievement: 01/21/20 Potential to Achieve Goals: Good  Plan Discharge plan needs to be updated    Co-evaluation      Reason for Co-Treatment: For patient/therapist safety;To address functional/ADL transfers;Other (comment)(limited eval previous day) PT goals addressed during session: Balance;Mobility/safety with mobility;Proper use of DME;Strengthening/ROM        AM-PAC OT "6 Clicks" Daily Activity     Outcome Measure   Help from another person eating meals?: None Help from another person taking care of personal grooming?: A Little Help from another person toileting, which includes using toliet, bedpan, or urinal?: A Little Help from another person bathing (including washing, rinsing, drying)?: A Lot Help from another person to put on and taking off regular upper body clothing?: A Little Help from another person to put on and taking off regular lower body clothing?: A Lot 6 Click Score: 17    End of Session Equipment Utilized During Treatment: Gait belt;Rolling walker  OT Visit Diagnosis: Muscle weakness (generalized) (M62.81);Other symptoms and signs involving cognitive function;Pain   Activity Tolerance Patient tolerated treatment well   Patient Left in chair;with call bell/phone within reach;with family/visitor present   Nurse Communication          Time: [phone] OT Time Calculation (min): 30 min  Charges: OT General Charges $OT Visit: 1 Visit OT Treatments $Self Care/Home Management : 8-22 mins  Nestor Lewandowsky, OTR/L Acute Rehabilitation Services Pager: [phone] Office: [phone]   Malka So 01/08/2020, 9:58 AM

## 2020-01-08 NOTE — Progress Notes (Signed)
Patient ID: Amy Weiss, female   DOB: 05-26-1935, 85 y.o.   MRN: [id number]  This NP visited patient at the bedside as a follow up for palliative medicine needs and emotional support.  Patient's daughter is at bedside.  Patient is alert  and her main focus continues to be  "when can I go home"   She is intermittently confused and has little insight into her current medical situation and care needs.  Continued conversation with daughter/Amy Weiss regarding current medical situation, underlying co-morbidities specific to her continued cognitive decline and overall fraility and the impact on likely medical trajectory.   Amy Weiss has lots of questions and concerns regarding transitions of care options.  Patient has been declined by CIR  We discussed in detail hospice benefit versus palliative services both at home and in facility.  I encouraged the daughter to consider the impact that cognition has on successful rehabilitation.  We also discussed the limited options of in-home care outside of out-of-pocket caregivers.  Patient had been seen by [facility] prior to this admission.  After lengthy discussion daughter has come to the realization that the best place for her mother is home with comfort and dignity is the focus of care.  I do believe that the patient is hospice eligible.  She is failing to thrive, poor po intake, continued bradycardia and no desire for pacemaker, multiple fall history and focus of cae is full comfort/MOST form completed  Discussed with daughter the importance of continued conversation with all family members and the medical providers regarding overall plan of care and treatment options,  ensuring decisions are within the context of the patients values and GOCs.  Several hours after plan was initialed, daughter now is requesting  assessment for SNF for short term rehab  Questions and concerns addressed   Discussed with Dr Nelson Chimes, PT/Jenn and [facility] via secure chat and  CMRN  Total time spent on the unit was 90  minutes   Palliative medicine will continue to support holistically  Greater than 50% of the time was spent in counseling and coordination of care  [name] NP  Palliative Medicine Team Team Phone # [phone] Pager [phone]

## 2020-01-08 NOTE — TOC Initial Note (Signed)
Transition of Care [facility]) - Initial/Assessment Note    Patient Details  Name: Amy Weiss MRN: [id number] Date of Birth: 01/16/1935  Transition of Care [facility]) CM/SW Contact:    Bartholomew Crews, RN Phone Number: [phone] 01/08/2020, 4:47 PM  Clinical Narrative:                 Spoke with daughter on the phone - daughter currently at patient bedside and put NCM on speaker phone for patient participation. Daughter advised that hospice deemed that patient was not eligible for services. Daughter and patient interested in [place] before patient returns home. Discussed difference between skilled care in a rehab center and custodial care, and SNF process for choosing a facility. Patient and daughter agreeable to patient information being faxed out. Discussed researching SNFs on medicare.gov as well as calling SNFs since in person tours are not allowed at this time. First choice of SNFs is [facility] (however, this SNF may be out of network with patient insurance). PASRR received and FL2 faxed out. TOC team to f/u with patient's daughter with bed offers.   Expected Discharge Plan: Skilled Nursing Facility Barriers to Discharge: SNF Pending bed offer   Patient Goals and CMS Choice Patient states their goals for this hospitalization and ongoing recovery are:: skilled rehab to get stronger before home CMS Medicare.gov Compare Post Acute Care list provided to:: Patient Choice offered to / list presented to : Adult Children, Patient  Expected Discharge Plan and Services Expected Discharge Plan: [place] In-house Referral: Clinical Social Work Discharge Planning Services: CM Consult Post Acute Care Choice: [place] arrangements for the past 2 months: Single Family Home                 DME Arranged: N/A DME Agency: NA       HH Arranged: NA [place] Agency: NA        Prior Living Arrangements/Services Living arrangements for the past 2 months: [place] with:: Self Patient language and need for interpreter reviewed:: Yes            Current home services: DME Criminal Activity/Legal Involvement Pertinent to Current Situation/Hospitalization: No - Comment as needed  Activities of Daily Living Home Assistive Devices/Equipment: Environmental consultant (specify type) ADL Screening (condition at time of admission) Patient's cognitive ability adequate to safely complete daily activities?: No Is the patient deaf or have difficulty hearing?: No Does the patient have difficulty seeing, even when wearing glasses/contacts?: No Does the patient have difficulty concentrating, remembering, or making decisions?: Yes Patient able to express need for assistance with ADLs?: No Does the patient have difficulty dressing or bathing?: No Independently performs ADLs?: No Communication: Independent Dressing (OT): Independent Grooming: Independent Feeding: Independent Bathing: Needs assistance Is this a change from baseline?: Pre-admission baseline Toileting: Independent In/Out Bed: Independent Walks in Home: Needs assistance(frequent falls) Is this a change from baseline?: Pre-admission baseline Does the patient have difficulty walking or climbing stairs?: Yes Weakness of Legs: None Weakness of Arms/Hands: None  Permission Sought/Granted Permission sought to share information with : Family Supports Permission granted to share information with : Yes, Verbal Permission Granted  Share Information with NAME: [name]           Emotional Assessment Appearance:: Appears stated age Attitude/Demeanor/Rapport: Engaged Affect (typically observed): Accepting Orientation: : Oriented to Self, Oriented to Place Alcohol / Substance Use: Not Applicable Psych Involvement: No (comment)  Admission diagnosis:  Back pain [M54.9] Patient Active Problem List  Diagnosis Date Noted  . Weakness generalized   . DNR (do not resuscitate)   . Cognitive changes   . Senile  dementia uncomp, without behavioral disturbance (HCC) 01/06/2020  . Hypothyroid 01/06/2020  . Back pain 01/02/2020  . Palliative care by specialist   . Encounter for hospice care discussion   . Bradycardia 12/24/2019  . Lumbar compression fracture, closed, initial encounter (HCC) 12/24/2019  . Lumbar compression fracture (HCC) 12/23/2019  . Acute bilateral low back pain without sciatica 11/26/2019  . PCP NOTES >>>> 10/18/2015  . Atrioventricular block, Mobitz type 1, Wenckebach 12/10/2014  . Eczema 12/29/2011  . Annual physical exam 05/09/2011  . SKIN LESION 05/06/2010  . Hyperglycemia 11/19/2008  . TIA 11/19/2008  . Hyperlipidemia 03/04/2008  . FAMILIAL TREMOR 03/04/2008  . Arrhythmia--h/o AV reentry tachycardia 03/04/2008  . ARTHRALGIA 03/04/2008  . OSTEOPOROSIS 03/04/2008  . Closed compression fracture of L2 lumbar vertebra, initial encounter (HCC) 04/05/2007   PCP:  [name], MD Pharmacy:   [facility] [id number] - [place], [place] - [id number] [place] [address] Phone: [phone] Fax: [phone]     Social Determinants of Health (SDOH) Interventions    Readmission Risk Interventions No flowsheet data found.

## 2020-01-08 NOTE — Consult Note (Signed)
[facility]:  [facility]  Pt is active with [facility] which is a home based palliative care program that is provided by [facility].   I received a Telephone call from [facility] NP [name]. She has asked that the pt be evaluated for hospice care at home when discharged.   We spoke to the daughter [name] to introduce the services and confirm interest in Hospice care. We spoke to our medical MD Dr. Gwenyth Ober and she does not feel the pt is meeting the criteria for hospice care at this time.   We have updated the daughter [name] on this and let her know that the pt continues to be appropriate for [facility] upon discharge and can have home health PT with this program as well.   Norm Parcel RN [phone]

## 2020-01-08 NOTE — Plan of Care (Signed)
  Problem: Education: Goal: Knowledge of the prescribed therapeutic regimen will improve Outcome: Progressing Goal: Understanding of discharge needs will improve Outcome: Progressing   Problem: Activity: Goal: Will remain free from falls Outcome: Progressing   Problem: Pain Management: Goal: Pain level will decrease Outcome: Progressing

## 2020-01-08 NOTE — Progress Notes (Signed)
PROGRESS NOTE    Amy Weiss  OJJ:[id number] DOB: [birth year] DOA: 01/02/2020 PCP: Colon Branch, MD   Brief Narrative:  Patient is an 85 year old female with past medical history of dementia, hypothyroidism and recent fall leading to compression fracture of the T12/L1 vertebral area status post kyphoplasty.  Patient was discharged home with home health and soon after discharge, sustained a mechanical fall and had new back pain.  Patient was arranged as a direct admission and lumbar MRI noted no new findings at previous vertebroplasty area, but no seen fracture at L2.  Case was discussed with neurosurgery who recommended patient be evaluated by interventional radiology who had done previous kyphoplasty.  Patient seen by interventional radiology I & D after insurance approval, patient was taken for kyphoplasty on 2/16.  Subjective: Her pain is improving.  4 out of 10 today.  She was able to participate with PT well. She was accompanied by her daughter and palliative care NP in the room.  Daughter has a lot of concerned that she does not qualify for SNF anymore as she is recovering well.  She was concerned that she needs 24/7 care and was worried that if anything happens to her.  We discussed options regarding private hiring versus paying SNF out-of-pocket.  She does not want to pay anything out-of-pocket either.  Had long discussions with patient and her daughter twice.  Assessment & Plan:   Principal Problem:   Closed compression fracture of L2 lumbar vertebra, initial encounter ([place]) Active Problems:   Bradycardia   Palliative care by specialist   Senile dementia uncomp, without behavioral disturbance ([place])   Hypothyroid   DNR (do not resuscitate)   Cognitive changes  Closed compression fracture of L2 lumbar vertebra, initial encounter [facility]): She underwent kyphoplasty with interventional radiology yesterday. Tolerated the procedure well.  Still having some postsurgical pain, seems  improving. -PT/OT are recommending home health.  Patient wants to go home with home health and daughter is very worried that she will not get the help she needs at home.  Palliative care is arranging home hospice to get some extra help. -Continue pain management.  Chronic asymptomatic bradycardia.  At times patient heart rate dips in high 30s.  According to daughter her normal heart rate is between 31-37.  Patient remained completely asymptomatic.  She is not on any nodal blocking agents and TSH is normal. -Continue to monitor.  Episodes of hypoglycemia.: A1c about a year ago at 6.0.  A1c this hospitalization is 5.7.  Will resolve diagnosis of prediabetes from her problem list  Dementia: Mild, relatively functional.  Patient lives alone.  Hypothyroidism: Continue Synthroid  Objective: Vitals:   01/07/20 1954 01/08/20 0238 01/08/20 0900 01/08/20 1300  BP: (!) 123/51 (!) 120/52 (!) 136/56 (!) 154/58  Pulse: (!) 51 (!) 56 (!) 44 (!) 41  Resp: 16 16 15 16   Temp: 98.7 F (37.1 C) 99 F (37.2 C) 97.9 F (36.6 C) 98.3 F (36.8 C)  TempSrc: Oral Oral Oral Oral  SpO2: 96% 97% 96% 96%  Weight:      Height:        Intake/Output Summary (Last 24 hours) at 01/08/2020 1454 Last data filed at 01/08/2020 0900 Gross per 24 hour  Intake 240 ml  Output 1200 ml  Net -960 ml   Filed Weights   01/03/20 1408  Weight: 62.1 kg    Examination:  General exam: Appears calm and comfortable  Respiratory system: Clear to auscultation. Respiratory effort normal. Cardiovascular  system: S1 & S2 heard, RRR. No JVD, murmurs, rubs, gallops or clicks. Gastrointestinal system: Soft, nontender, nondistended, bowel sounds positive. Central nervous system: Alert and oriented. No focal neurological deficits. Extremities: No edema, no cyanosis, pulses intact and symmetrical. Skin: No rashes, lesions or ulcers Psychiatry: Judgement and insight appear normal. Mood & affect appropriate.    DVT prophylaxis:  Lovenox Code Status: DNR Family Communication: Daughter was updated at bedside.  She is very difficult to understand that she does not qualify for SNF and have to go home with home health. Disposition Plan: Medically stable to be discharge.  She will go home tomorrow with home health and hospice care.  Daughter was very reluctant to drive today due to bad weather.  Consultants:   IR  Discussed with neurosurgery  Palliative care  Procedures:  Kyphoplasty on 01/06/2020  Antimicrobials:   Data Reviewed: I have personally reviewed following labs and imaging studies  CBC: Recent Labs  Lab 01/03/20 0239 01/04/20 0519 01/05/20 0916 01/06/20 0242 01/07/20 0302  WBC 6.1 6.8 5.8 6.1 6.0  HGB 14.0 13.4 14.7 13.2 13.5  HCT 41.9 40.6 44.8 39.9 40.9  MCV 90.1 91.6 92.0 90.9 91.3  PLT 257 246 246 233 239   Basic Metabolic Panel: Recent Labs  Lab 01/03/20 0239 01/04/20 0519 01/05/20 0916 01/06/20 0242 01/07/20 0302  NA 138 138 138 138 139  K 4.2 4.4 4.7 4.3 4.5  CL 107 107 104 108 104  CO2 23 23 24 23 25   GLUCOSE 109* 101* 91 95 89  BUN 11 11 12 13 11   CREATININE 0.58 0.74 0.71 0.66 0.71  CALCIUM 8.9 8.6* 8.9 8.7* 9.0  MG 2.0 2.0 2.1 1.9 2.0   GFR: Estimated Creatinine Clearance: 52.7 mL/min (by C-G formula based on SCr of 0.71 mg/dL). Liver Function Tests: No results for input(s): AST, ALT, ALKPHOS, BILITOT, PROT, ALBUMIN in the last 168 hours. No results for input(s): LIPASE, AMYLASE in the last 168 hours. No results for input(s): AMMONIA in the last 168 hours. Coagulation Profile: Recent Labs  Lab 01/05/20 0916  INR 1.0   Cardiac Enzymes: No results for input(s): CKTOTAL, CKMB, CKMBINDEX, TROPONINI in the last 168 hours. BNP (last 3 results) No results for input(s): PROBNP in the last 8760 hours. HbA1C: No results for input(s): HGBA1C in the last 72 hours. CBG: No results for input(s): GLUCAP in the last 168 hours. Lipid Profile: No results for input(s):  CHOL, HDL, LDLCALC, TRIG, CHOLHDL, LDLDIRECT in the last 72 hours. Thyroid Function Tests: No results for input(s): TSH, T4TOTAL, FREET4, T3FREE, THYROIDAB in the last 72 hours. Anemia Panel: No results for input(s): VITAMINB12, FOLATE, FERRITIN, TIBC, IRON, RETICCTPCT in the last 72 hours. Sepsis Labs: No results for input(s): PROCALCITON, LATICACIDVEN in the last 168 hours.  Recent Results (from the past 240 hour(s))  SARS CORONAVIRUS 2 (TAT 6-24 HRS) Nasopharyngeal Nasopharyngeal Swab     Status: None   Collection Time: 01/03/20  1:25 PM   Specimen: Nasopharyngeal Swab  Result Value Ref Range Status   SARS Coronavirus 2 NEGATIVE NEGATIVE Final    Comment: (NOTE) SARS-CoV-2 target nucleic acids are NOT DETECTED. The SARS-CoV-2 RNA is generally detectable in upper and lower respiratory specimens during the acute phase of infection. Negative results do not preclude SARS-CoV-2 infection, do not rule out co-infections with other pathogens, and should not be used as the sole basis for treatment or other patient management decisions. Negative results must be combined with clinical observations, patient history, and  epidemiological information. The expected result is Negative. Fact Sheet for Patients: HairSlick.no Fact Sheet for Healthcare Providers: quierodirigir.com This test is not yet approved or cleared by the [place] FDA and  has been authorized for detection and/or diagnosis of SARS-CoV-2 by FDA under an Emergency Use Authorization (EUA). This EUA will remain  in effect (meaning this test can be used) for the duration of the COVID-19 declaration under Section 56 4(b)(1) of the Act, 21 U.S.C. section 360bbb-3(b)(1), unless the authorization is terminated or revoked sooner. Performed at [facility], [address] [place], [place] [address]      Radiology Studies: No results found.  Scheduled Meds: .  aspirin EC  81 mg Oral Daily  . levothyroxine  88 mcg Oral QAC breakfast  . multivitamin with minerals  1 tablet Oral Daily  . senna-docusate  2 tablet Oral QHS  . Vitamin D (Ergocalciferol)  50,000 Units Oral Q7 days   Continuous Infusions:    LOS: 5 days   Time spent: 50 minutes.  Arnetha Courser, MD [facility]  If 7PM-7AM, please contact night-coverage [email or web address]  01/08/2020, 2:54 PM   This record has been created using Conservation officer, historic buildings. Errors have been sought and corrected,but may not always be located. Such creation errors do not reflect on the standard of care.

## 2020-01-08 NOTE — Progress Notes (Addendum)
Physical Therapy Treatment Patient Details Name: Amy Weiss MRN: [id number] DOB: [birth year] Today's Date: 01/08/2020    History of Present Illness Patient is an 85 year old female with past medical history of dementia, hypothyroidism and recent fall leading to compression fracture of the T12/L1 vertebral area status post kyphoplasty.  Patient was discharged home with home health and soon after discharge, sustained a mechanical fall and had new back pain.  Patient was arranged as a direct admission and lumbar MRI noted no new findings at previous vertebroplasty area, but new fracture at L2.  Now s/p kyphoplasty on 2/16    PT Comments    Pt making excellent progress since previous session. She was able to tolerate transfer training and gait training into hallway with min guard and use of RW. No LOB or instability noted throughout. Pt with c/o low back and posterior R hip pain; however, this did not limit her ability to participate in session. [name] present throughout entire session and very hopeful that pt can go to a rehab facility prior to returning home with family support. Although pt is wanting to return home, family is concerned as she returned home after last admission and fell which lead to this admission.   Pt would continue to benefit from skilled physical therapy services at this time while admitted and after d/c to address the below listed limitations in order to improve overall safety and independence with functional mobility.    Follow Up Recommendations  SNF; Other (family is requesting pt d/c to SNF for short term rehab)     Equipment Recommendations  None recommended by PT    Recommendations for Other Services       Precautions / Restrictions Precautions Precautions: Fall;Back Precaution Comments: cueing needed for log roll technique Restrictions Weight Bearing Restrictions: No    Mobility  Bed Mobility Overal bed mobility: Needs Assistance Bed Mobility:  Rolling;Sidelying to Sit Rolling: Min guard Sidelying to sit: Min guard       General bed mobility comments: verbal and tactile cueing for log roll technique, use of bed rails  Transfers Overall transfer level: Needs assistance Equipment used: Rolling walker (2 wheeled) Transfers: Sit to/from Stand Sit to Stand: Min guard         General transfer comment: increased time, min guard for safety; pt steady overall with transitional movement  Ambulation/Gait Ambulation/Gait assistance: Min guard Gait Distance (Feet): 200 Feet Assistive device: Rolling walker (2 wheeled) Gait Pattern/deviations: Step-through pattern;Decreased stride length Gait velocity: decreased   General Gait Details: pt requiring several brief standing rest breaks secondary to fatigue and pain; no LOB or need for physical assistance, min guard for safety   Stairs             Wheelchair Mobility    Modified Rankin (Stroke Patients Only)       Balance Overall balance assessment: Needs assistance Sitting-balance support: Feet supported Sitting balance-Leahy Scale: Good     Standing balance support: Bilateral upper extremity supported;Single extremity supported Standing balance-Leahy Scale: Poor                              Cognition Arousal/Alertness: Awake/alert Behavior During Therapy: WFL for tasks assessed/performed;Restless Overall Cognitive Status: History of cognitive impairments - at baseline Area of Impairment: Memory;Safety/judgement;Problem solving                     Memory: Decreased short-term memory;Decreased recall of precautions  Safety/Judgement: Decreased awareness of deficits;Decreased awareness of safety   Problem Solving: Difficulty sequencing;Requires verbal cues        Exercises      General Comments        Pertinent Vitals/Pain Pain Assessment: Faces Faces Pain Scale: Hurts little more Pain Location: low back and posterior R  hip Pain Descriptors / Indicators: Grimacing;Guarding Pain Intervention(s): Monitored during session;Repositioned    Home Living                      Prior Function            PT Goals (current goals can now be found in the care plan section) Acute Rehab PT Goals PT Goal Formulation: With patient/family Time For Goal Achievement: 01/21/20 Potential to Achieve Goals: Good Progress towards PT goals: Progressing toward goals    Frequency    Min 3X/week      PT Plan Discharge plan needs to be updated    Co-evaluation PT/OT/SLP Co-Evaluation/Treatment: Yes Reason for Co-Treatment: For patient/therapist safety;To address functional/ADL transfers;Other (comment)(limited eval previous day) PT goals addressed during session: Balance;Mobility/safety with mobility;Proper use of DME;Strengthening/ROM        AM-PAC PT "6 Clicks" Mobility   Outcome Measure  Help needed turning from your back to your side while in a flat bed without using bedrails?: None Help needed moving from lying on your back to sitting on the side of a flat bed without using bedrails?: None Help needed moving to and from a bed to a chair (including a wheelchair)?: A Little Help needed standing up from a chair using your arms (e.g., wheelchair or bedside chair)?: A Little Help needed to walk in hospital room?: A Little Help needed climbing 3-5 steps with a railing? : A Lot 6 Click Score: 19    End of Session Equipment Utilized During Treatment: Gait belt Activity Tolerance: Patient tolerated treatment well Patient left: in chair;with call bell/phone within reach;with family/visitor present Nurse Communication: Mobility status PT Visit Diagnosis: Other abnormalities of gait and mobility (R26.89);Pain Pain - part of body: (back)     Time: 3267-1245 PT Time Calculation (min) (ACUTE ONLY): 28 min  Charges:  $Gait Training: 8-22 mins                     Amy Weiss, DPT  Acute Rehabilitation  Services Pager [phone] Office [phone]     Amy Weiss Amy Weiss 01/08/2020, 9:09 AM

## 2020-01-08 NOTE — NC FL2 (Signed)
[place] LEVEL OF CARE SCREENING TOOL     IDENTIFICATION  Patient Name: Amy Weiss Birthdate: 1934/11/25 Sex: female Admission Date (Current Location): 01/02/2020  [facility] and [place] Number:  Herbalist and Address:  [facility] [place], [place] [address] [place], [place] [address]      Provider Number: [id number]  Attending Physician Name and Address:  Lorella Nimrod, MD  Relative Name and Phone Number:  Rosanne Sack, daughter [phone]    Current Level of Care: Hospital Recommended Level of Care: [place] Prior Approval Number:    Date Approved/Denied: 01/08/20 PASRR Number: [id number]  Discharge Plan: SNF    Current Diagnoses: Patient Active Problem List   Diagnosis Date Noted  . Weakness generalized   . DNR (do not resuscitate)   . Cognitive changes   . Senile dementia uncomp, without behavioral disturbance ([place]) 01/06/2020  . Hypothyroid 01/06/2020  . Back pain 01/02/2020  . Palliative care by specialist   . Encounter for hospice care discussion   . Bradycardia 12/24/2019  . Lumbar compression fracture, closed, initial encounter ([place]) 12/24/2019  . Lumbar compression fracture ([place]) 12/23/2019  . Acute bilateral low back pain without sciatica 11/26/2019  . PCP NOTES >>>> 10/18/2015  . Atrioventricular block, Mobitz type 1, Wenckebach 12/10/2014  . Eczema 12/29/2011  . Annual physical exam 05/09/2011  . SKIN LESION 05/06/2010  . Hyperglycemia 11/19/2008  . TIA 11/19/2008  . Hyperlipidemia 03/04/2008  . FAMILIAL TREMOR 03/04/2008  . Arrhythmia--h/o AV reentry tachycardia 03/04/2008  . ARTHRALGIA 03/04/2008  . OSTEOPOROSIS 03/04/2008  . Closed compression fracture of L2 lumbar vertebra, initial encounter ([place]) 04/05/2007    Orientation RESPIRATION BLADDER Height & Weight     Self, Place  Normal Incontinent Weight: 62.1 kg Height:  5\' 7"  (170.2 cm)  BEHAVIORAL SYMPTOMS/MOOD NEUROLOGICAL  BOWEL NUTRITION STATUS      Continent Diet  AMBULATORY STATUS COMMUNICATION OF NEEDS Skin   Limited Assist Verbally Normal                       Personal Care Assistance Level of Assistance  Bathing, Feeding, Dressing Bathing Assistance: Limited assistance Feeding assistance: Limited assistance Dressing Assistance: Limited assistance     Functional Limitations Info  Sight, Hearing, Speech Sight Info: Adequate Hearing Info: Adequate Speech Info: Adequate    SPECIAL CARE FACTORS FREQUENCY  PT (By licensed PT), OT (By licensed OT)     PT Frequency: PT at SNF to eval and treat a min of 5x/week OT Frequency: OT at SNF to eval and treat a min of 5x/week            Contractures Contractures Info: Not present    Additional Factors Info  Code Status, Allergies Code Status Info: DNR Allergies Info: Sulfa           Current Medications (01/08/2020):  This is the current hospital active medication list Current Facility-Administered Medications  Medication Dose Route Frequency Provider Last Rate Last Admin  . aspirin EC tablet 81 mg  81 mg Oral Daily Colbert Ewing, MD   81 mg at 01/08/20 0949  . HYDROcodone-acetaminophen (NORCO/VICODIN) 5-325 MG per tablet 1-2 tablet  1-2 tablet Oral Q4H PRN Lorella Nimrod, MD   2 tablet at 01/08/20 0949  . levothyroxine (SYNTHROID) tablet 88 mcg  88 mcg Oral QAC breakfast Colbert Ewing, MD   88 mcg at 01/08/20 0550  . multivitamin with minerals tablet 1 tablet  1 tablet Oral Daily Park, Bartholome Bill, MD   1 tablet at 01/08/20 [phone]  . polyethylene glycol (MIRALAX / GLYCOLAX) packet 17 g  17 g Oral Daily PRN Ike Bene, MD   17 g at 01/05/20 1025  . senna (SENOKOT) tablet 8.6 mg  1 tablet Oral Daily PRN Park, Bartholome Bill, MD      . senna-docusate (Senokot-S) tablet 2 tablet  2 tablet Oral QHS Ike Bene, MD   2 tablet at 01/07/20 2204  . Vitamin D (Ergocalciferol) (DRISDOL) capsule 50,000 Units  50,000 Units Oral Q7 days Ike Bene, MD   50,000 Units at 01/03/20 1050     Discharge Medications: Please see discharge summary for a list of discharge medications.  Relevant Imaging Results:  Relevant Lab Results:   Additional Information SSN [id number]  Bess Kinds, RN

## 2020-01-08 NOTE — Progress Notes (Signed)
Inpatient Rehabilitation Admissions Coordinator  Inpatient rehab consult received. Patient much improved with therapy today. HH is recommended. I have notified SW, Erie Noe. No needs for an inpt rehab admit at this time. We will sign off.  Ottie Glazier, RN, MSN Rehab Admissions Coordinator [phone] 01/08/2020 10:14 AM

## 2020-01-09 DIAGNOSIS — R296 Repeated falls: Secondary | ICD-10-CM

## 2020-01-09 MED ORDER — ACETAMINOPHEN 500 MG PO TABS
1000.0000 mg | ORAL_TABLET | Freq: Three times a day (TID) | ORAL | Status: DC
  Administered 2020-01-09 – 2020-01-12 (×9): 1000 mg via ORAL
  Filled 2020-01-09 (×9): qty 2

## 2020-01-09 MED ORDER — OXYCODONE HCL 5 MG PO TABS
5.0000 mg | ORAL_TABLET | Freq: Three times a day (TID) | ORAL | Status: DC | PRN
  Administered 2020-01-09 – 2020-01-12 (×7): 5 mg via ORAL
  Filled 2020-01-09 (×9): qty 1

## 2020-01-09 NOTE — Progress Notes (Signed)
Physical Therapy Treatment Patient Details Name: Amy Weiss MRN: [id number] DOB: 10-11-1935 Today's Date: 01/09/2020    History of Present Illness Patient is an 85 year old female with past medical history of dementia, hypothyroidism and recent fall leading to compression fracture of the T12/L1 vertebral area status post kyphoplasty.  Patient was discharged home with home health and soon after discharge, sustained a mechanical fall and had new back pain.  Patient was arranged as a direct admission and lumbar MRI noted no new findings at previous vertebroplasty area, but new fracture at L2.  Now s/p kyphoplasty on 2/16    PT Comments    Pt more limited this session secondary to pain, but still able to perform bed mobility and transfers with supervision/min guard. No physical assistance needed. Pt refusing ambulation this session but agreeable to get OOB with assist from staff later today. Pt would continue to benefit from skilled physical therapy services at this time while admitted and after d/c to address the below listed limitations in order to improve overall safety and independence with functional mobility.    Follow Up Recommendations  SNF;Other (comment)(family is requesting SNF)     Equipment Recommendations  None recommended by PT    Recommendations for Other Services       Precautions / Restrictions Precautions Precautions: Fall;Back Restrictions Weight Bearing Restrictions: No    Mobility  Bed Mobility Overal bed mobility: Needs Assistance Bed Mobility: Rolling;Sidelying to Sit;Sit to Sidelying Rolling: Supervision Sidelying to sit: Supervision     Sit to sidelying: Supervision General bed mobility comments: supervision for safety, use of bed rails  Transfers Overall transfer level: Needs assistance Equipment used: Rolling walker (2 wheeled) Transfers: Sit to/from [employer] Transfers Sit to Stand: Min guard Stand pivot transfers: Min guard        General transfer comment: good technique utilized, min guard for safety; pt with increased pain during transfers  Ambulation/Gait             General Gait Details: pt refusing ambulation this session   Stairs             Wheelchair Mobility    Modified Rankin (Stroke Patients Only)       Balance Overall balance assessment: Needs assistance Sitting-balance support: Feet supported Sitting balance-Leahy Scale: Good     Standing balance support: Bilateral upper extremity supported;Single extremity supported Standing balance-Leahy Scale: Poor                              Cognition Arousal/Alertness: Awake/alert Behavior During Therapy: WFL for tasks assessed/performed;Restless Overall Cognitive Status: History of cognitive impairments - at baseline Area of Impairment: Memory;Safety/judgement;Problem solving                     Memory: Decreased short-term memory;Decreased recall of precautions   Safety/Judgement: Decreased awareness of deficits;Decreased awareness of safety   Problem Solving: Difficulty sequencing;Requires verbal cues        Exercises      General Comments        Pertinent Vitals/Pain Pain Assessment: Faces Faces Pain Scale: Hurts little more Pain Location: low back and posterior R hip Pain Descriptors / Indicators: Grimacing;Guarding Pain Intervention(s): Monitored during session;Repositioned    Home Living                      Prior Function  PT Goals (current goals can now be found in the care plan section) Acute Rehab PT Goals PT Goal Formulation: With patient/family Time For Goal Achievement: 01/21/20 Potential to Achieve Goals: Good Progress towards PT goals: Progressing toward goals    Frequency    Min 3X/week      PT Plan Current plan remains appropriate    Co-evaluation              AM-PAC PT "6 Clicks" Mobility   Outcome Measure  Help needed turning from  your back to your side while in a flat bed without using bedrails?: None Help needed moving from lying on your back to sitting on the side of a flat bed without using bedrails?: None Help needed moving to and from a bed to a chair (including a wheelchair)?: A Little Help needed standing up from a chair using your arms (e.g., wheelchair or bedside chair)?: A Little Help needed to walk in hospital room?: A Little Help needed climbing 3-5 steps with a railing? : A Little 6 Click Score: 20    End of Session   Activity Tolerance: Patient limited by pain Patient left: in bed;with call bell/phone within reach;with bed alarm set Nurse Communication: Mobility status PT Visit Diagnosis: Other abnormalities of gait and mobility (R26.89);Pain Pain - part of body: (back)     Time: 4854-6270 PT Time Calculation (min) (ACUTE ONLY): 13 min  Charges:  $Therapeutic Activity: 8-22 mins                     Anastasio Champion, DPT  Acute Rehabilitation Services Pager [phone] Office [place] 01/09/2020, 12:37 PM

## 2020-01-09 NOTE — Progress Notes (Signed)
PROGRESS NOTE  Amy Weiss:[id number] DOB: [birth year]   PCP: [name], MD  Patient is from: Home.  Lives alone.  DOA: 01/02/2020 LOS: 6  Brief Narrative / Interim history: 85 year old female with past medical history ofdementia, hypothyroidism and recent fall leading to compression fracture of the T12/L1 vertebral area status post kyphoplasty.  At that time, therapy recommended SNF but patient and family decided to take her home with therapy.  Soon after discharge,patient had another fall and returned to hospital.  MRI lumbar spine revealed new L2 compression fracture with 10% height loss without retropulsion.  Case discussed with neurosurgery who recommended IR evaluation.  Patient had kyphoplasty on 2/16.   Patient's daughter and Avon Gully, [name] now wants patient to be placed at [facility].  Patient refuses SNF placement but has no capacity to make reasonable medical decision.  Subjective: No major events overnight or this morning.  Reports mild back pain.  Denies headache, chest pain, dyspnea, GI or UTI symptoms.  Oriented to self, place and year. She does not want to go to rehab but has not had good recollection of her recurrent fall and spine fractures.   Objective: Vitals:   01/08/20 1300 01/08/20 1938 01/09/20 0612 01/09/20 0804  BP: (!) 154/58 (!) 126/55 140/68 (!) 114/59  Pulse: (!) 41 (!) 42 (!) 42 (!) 44  Resp: 16 17 15 20   Temp: 98.3 F (36.8 C) 98.2 F (36.8 C) 98.4 F (36.9 C) 97.9 F (36.6 C)  TempSrc: Oral Oral Oral Oral  SpO2: 96% 96% 97% 96%  Weight:      Height:        Intake/Output Summary (Last 24 hours) at 01/09/2020 1153 Last data filed at 01/08/2020 1908 Gross per 24 hour  Intake 600 ml  Output --  Net 600 ml   Filed Weights   01/03/20 1408  Weight: 62.1 kg    Examination:  GENERAL: No acute distress.  Appears well.  HEENT: MMM.  Vision and hearing grossly intact.  NECK: Supple.  No apparent JVD.  RESP:  No IWOB. Good air movement  bilaterally. CVS:  RRR. Heart sounds normal.  ABD/GI/GU: Bowel sounds present. Soft. Non tender.  MSK/EXT:  Moves extremities. No apparent deformity. No edema.  SKIN: no apparent skin lesion or wound NEURO: Awake, alert and oriented self, place and year.  No apparent focal neuro deficit. PSYCH: Calm.  Limited insight.  Procedures:  2/16-kyphoplasty of L2 fracture  Assessment & Plan: Recurrent mechanical fall Closed compression fracture of L2 vertebrae, initial encounter Close compression fracture of T12-L1 vertebra, subsequent encounter -Has had kyphoplasty of L2 fracture on 2/16. -Had kyphoplasty of T12-L1 prior admission. -PT/OT-recommended SNF -Scheduled Tylenol and as needed oxycodone for pain control -Continue vitamin D and calcium. -Turned down by hospice-did not qualify.  Chronic junctional bradycardia: Chronic.  Baseline in 30s per family and patient which seems to be right based on a EKG from 2015.  Not on nodal blocking agent or Aricept.  TSH within normal.  Per patient and family, heart rate improves with movement and exertion.  Doubt candidacy for pacemaker -Check ambulatory pulse rate  Hypoglycemic episode: A1c 5.7%.  -Monitor intermittently  Dementia: Oriented to self, place and year.  Poor insight.  Lives alone. -Frequent orientation delirium precautions  Hypothyroidism: TSH within normal -Continue home Synthroid.                DVT prophylaxis: Start subcu Lovenox Code Status: DNR/DNI Family Communication: Updated patient's daughter over the  phone.  Discharge barrier: Safe disposition which is SNF at this point Patient is from: Home Final disposition: SNF when bed is available  Consultants: NS, IR   Microbiology summarized: COVID-19 negative.  Sch Meds:  Scheduled Meds: . aspirin EC  81 mg Oral Daily  . levothyroxine  88 mcg Oral QAC breakfast  . multivitamin with minerals  1 tablet Oral Daily  . senna-docusate  2 tablet Oral QHS  .  Vitamin D (Ergocalciferol)  50,000 Units Oral Q7 days   Continuous Infusions: PRN Meds:.HYDROcodone-acetaminophen, polyethylene glycol, senna  Antimicrobials: Anti-infectives (From admission, onward)   Start     Dose/Rate Route Frequency Ordered Stop   01/06/20 1230  tobramycin (NEBCIN) 1.2 g powder    Note to Pharmacy: Armando Reichert   : cabinet override      01/06/20 1230 01/06/20 1310   01/06/20 1215  ceFAZolin (ANCEF) IVPB 2g/100 mL premix     2 g 200 mL/hr over 30 Minutes Intravenous  Once 01/06/20 1207 01/06/20 1301       I have personally reviewed the following labs and images: CBC: Recent Labs  Lab 01/03/20 0239 01/04/20 0519 01/05/20 0916 01/06/20 0242 01/07/20 0302  WBC 6.1 6.8 5.8 6.1 6.0  HGB 14.0 13.4 14.7 13.2 13.5  HCT 41.9 40.6 44.8 39.9 40.9  MCV 90.1 91.6 92.0 90.9 91.3  PLT 257 246 246 233 239   BMP &GFR Recent Labs  Lab 01/03/20 0239 01/04/20 0519 01/05/20 0916 01/06/20 0242 01/07/20 0302  NA 138 138 138 138 139  K 4.2 4.4 4.7 4.3 4.5  CL 107 107 104 108 104  CO2 23 23 24 23 25   GLUCOSE 109* 101* 91 95 89  BUN 11 11 12 13 11   CREATININE 0.58 0.74 0.71 0.66 0.71  CALCIUM 8.9 8.6* 8.9 8.7* 9.0  MG 2.0 2.0 2.1 1.9 2.0   Estimated Creatinine Clearance: 52.7 mL/min (by C-G formula based on SCr of 0.71 mg/dL). Liver & Pancreas: No results for input(s): AST, ALT, ALKPHOS, BILITOT, PROT, ALBUMIN in the last 168 hours. No results for input(s): LIPASE, AMYLASE in the last 168 hours. No results for input(s): AMMONIA in the last 168 hours. Diabetic: No results for input(s): HGBA1C in the last 72 hours. No results for input(s): GLUCAP in the last 168 hours. Cardiac Enzymes: No results for input(s): CKTOTAL, CKMB, CKMBINDEX, TROPONINI in the last 168 hours. No results for input(s): PROBNP in the last 8760 hours. Coagulation Profile: Recent Labs  Lab 01/05/20 0916  INR 1.0   Thyroid Function Tests: No results for input(s): TSH, T4TOTAL,  FREET4, T3FREE, THYROIDAB in the last 72 hours. Lipid Profile: No results for input(s): CHOL, HDL, LDLCALC, TRIG, CHOLHDL, LDLDIRECT in the last 72 hours. Anemia Panel: No results for input(s): VITAMINB12, FOLATE, FERRITIN, TIBC, IRON, RETICCTPCT in the last 72 hours. Urine analysis:    Component Value Date/Time   COLORURINE YELLOW 12/26/2019 0926   APPEARANCEUR CLEAR 12/26/2019 0926   LABSPEC 1.011 12/26/2019 0926   PHURINE 6.0 12/26/2019 0926   GLUCOSEU NEGATIVE 12/26/2019 0926   GLUCOSEU NEGATIVE 10/13/2014 1615   HGBUR NEGATIVE 12/26/2019 0926   BILIRUBINUR NEGATIVE 12/26/2019 0926   KETONESUR NEGATIVE 12/26/2019 0926   PROTEINUR NEGATIVE 12/26/2019 0926   UROBILINOGEN 0.2 10/13/2014 1615   NITRITE NEGATIVE 12/26/2019 0926   LEUKOCYTESUR SMALL (A) 12/26/2019 0926   Sepsis Labs: Invalid input(s): PROCALCITONIN, [place]  Microbiology: Recent Results (from the past 240 hour(s))  SARS CORONAVIRUS 2 (TAT 6-24 HRS) Nasopharyngeal Nasopharyngeal Swab  Status: None   Collection Time: 01/03/20  1:25 PM   Specimen: Nasopharyngeal Swab  Result Value Ref Range Status   SARS Coronavirus 2 NEGATIVE NEGATIVE Final    Comment: (NOTE) SARS-CoV-2 target nucleic acids are NOT DETECTED. The SARS-CoV-2 RNA is generally detectable in upper and lower respiratory specimens during the acute phase of infection. Negative results do not preclude SARS-CoV-2 infection, do not rule out co-infections with other pathogens, and should not be used as the sole basis for treatment or other patient management decisions. Negative results must be combined with clinical observations, patient history, and epidemiological information. The expected result is Negative. Fact Sheet for Patients: HairSlick.no Fact Sheet for Healthcare Providers: quierodirigir.com This test is not yet approved or cleared by the [place] FDA and  has been authorized  for detection and/or diagnosis of SARS-CoV-2 by FDA under an Emergency Use Authorization (EUA). This EUA will remain  in effect (meaning this test can be used) for the duration of the COVID-19 declaration under Section 56 4(b)(1) of the Act, 21 U.S.C. section 360bbb-3(b)(1), unless the authorization is terminated or revoked sooner. Performed at [facility], [address] [place], [place] [address]     Radiology Studies: No results found.    Annalyse Langlais T. Everlena Mackley [facility]  If 7PM-7AM, please contact night-coverage [email or web address] Password [facility] 01/09/2020, 11:53 AM

## 2020-01-09 NOTE — Progress Notes (Signed)
Dr. Candelaria Stagers asls to ambulate patient and check her HR during ambulation. Patient refusing to ambulate. Did check HR during stand and pivot from the side of the bed to the bedside commode.   Sitting on the side of bed, HR @ 95, standing to pivot HR dropped to 65.

## 2020-01-09 NOTE — Care Management (Addendum)
Spoke to patient's daughter to discuss bed offers. She wanted me to send referral to [facility] and [facility].  I sent out to [facility] on the [place]. [facility] was not in the [place]. I left a VM with Hildred Alamin the admission Coordinator requesting call back. Her number is [phone]. Referral will need faxed.  Discussed with daughter that options are usually pretty limited with [facility] because they do not contract with many SNFs. She understands that options may be limited and at some point we will have to choose from the facilities that are able to offer a bed. She understands that she and her mom will be the decision makers in this and ultimately the decision is theirs but it may be one with few options.   Nyra Capes Daughter [phone]   16:15 Spoke w AD at [facility], they are not in network with [employer]. I updated patient's daughter and encouraged her to look into the two SNFs that have accepted- [facility], and [place], as we may not hear back from other facilities over the weekend, and her mom may reach medical stability and be able to discharge soon.

## 2020-01-09 NOTE — Plan of Care (Signed)
  Problem: Education: Goal: Ability to verbalize activity precautions or restrictions will improve Outcome: Progressing Goal: Knowledge of the prescribed therapeutic regimen will improve Outcome: Progressing Goal: Understanding of discharge needs will improve Outcome: Progressing   Problem: Activity: Goal: Ability to avoid complications of mobility impairment will improve Outcome: Progressing   Problem: Bowel/Gastric: Goal: Gastrointestinal status for postoperative course will improve Outcome: Progressing   Problem: Pain Management: Goal: Pain level will decrease Outcome: Progressing   Problem: Skin Integrity: Goal: Will show signs of wound healing Outcome: Progressing   Problem: Health Behavior/Discharge Planning: Goal: Identification of resources available to assist in meeting health care needs will improve Outcome: Progressing

## 2020-01-10 NOTE — Progress Notes (Signed)
Refused am vital signs.

## 2020-01-10 NOTE — Progress Notes (Signed)
PROGRESS NOTE  Amy Weiss QHU:[id number] DOB: [birth year]   PCP: [name], MD  Patient is from: Home.  Lives alone.  DOA: 01/02/2020 LOS: 7  Brief Narrative / Interim history: 85 year old female with past medical history ofdementia, hypothyroidism and recent fall leading to compression fracture of the T12/L1 vertebral area status post kyphoplasty.  At that time, therapy recommended SNF but patient and family decided to take her home with therapy.  Soon after discharge,patient had another fall and returned to hospital.  MRI lumbar spine revealed new L2 compression fracture with 10% height loss without retropulsion.  Case discussed with neurosurgery who recommended IR evaluation.  Patient had kyphoplasty on 2/16.   Patient's daughter and Avon Gully, [name] now wants patient to be placed at [facility].  Patient refuses SNF placement but has no capacity to make reasonable medical decision.  Subjective: No major events overnight or this morning.  No complaint this morning.  Denies chest pain, dyspnea, GI or UTI symptoms.  Daughter at bedside.  Objective: Vitals:   01/09/20 0804 01/09/20 1300 01/09/20 1950 01/10/20 0752  BP: (!) 114/59 (!) 155/72 140/88 133/63  Pulse: (!) 44 (!) 42 (!) 51 (!) 47  Resp: 20 18 17 17   Temp: 97.9 F (36.6 C) 98.2 F (36.8 C) 98 F (36.7 C) 97.9 F (36.6 C)  TempSrc: Oral Oral Oral Oral  SpO2: 96% 98% 99% 97%  Weight:      Height:        Intake/Output Summary (Last 24 hours) at 01/10/2020 1208 Last data filed at 01/10/2020 0900 Gross per 24 hour  Intake 120 ml  Output --  Net 120 ml   Filed Weights   01/03/20 1408  Weight: 62.1 kg    Examination:  GENERAL: No acute distress.  Appears well.  HEENT: MMM.  Vision and hearing grossly intact.  NECK: Supple.  No apparent JVD.  RESP:  No IWOB. Good air movement bilaterally. CVS:  RRR. Heart sounds normal.  ABD/GI/GU: Bowel sounds present. Soft. Non tender.  MSK/EXT:  Moves extremities. No apparent  deformity. No edema.  SKIN: no apparent skin lesion or wound NEURO: Awake, alert and oriented fairly.  No apparent focal neuro deficit. PSYCH: Calm. Normal affect.  Procedures:  2/16-kyphoplasty of L2 fracture  Assessment & Plan: Recurrent mechanical fall Closed compression fracture of L2 vertebrae, initial encounter Close compression fracture of T12-L1 vertebra, subsequent encounter -Has had kyphoplasty of L2 fracture on 2/16. -Had kyphoplasty of T12-L1 prior admission. -PT/OT-recommended SNF -Scheduled Tylenol and as needed oxycodone for pain control -Continue vitamin D and calcium. -Turned down by hospice-did not qualify.  Chronic junctional bradycardia: Chronic.  B/l HR in 30s per family and patient which seems to be right based on a EKG from 2015.  Improves with exertion.  Not on nodal blocking agent or Aricept.  TSH within normal.  Per patient and family, heart rate improves with movement and exertion.  Per daughter, declined pacemaker 5 years ago.  Hypoglycemic episode: A1c 5.7%.  -Monitor intermittently  Dementia: Oriented to self, place and year.  Poor insight.  Lives alone. -Frequent orientation delirium precautions  Hypothyroidism: TSH within normal -Continue home Synthroid.                DVT prophylaxis: Start subcu Lovenox Code Status: DNR/DNI Family Communication: Updated patient's daughter over the phone.  Discharge barrier: Safe disposition which would be SNF bed at this time. Patient is from: Home Final disposition: SNF when bed is available.  Medically  stable for discharge.  Consultants: NS and IR (off)   Microbiology summarized: COVID-19 negative.  Sch Meds:  Scheduled Meds: . acetaminophen  1,000 mg Oral Q8H  . aspirin EC  81 mg Oral Daily  . levothyroxine  88 mcg Oral QAC breakfast  . multivitamin with minerals  1 tablet Oral Daily  . senna-docusate  2 tablet Oral QHS  . Vitamin D (Ergocalciferol)  50,000 Units Oral Q7 days    Continuous Infusions: PRN Meds:.oxyCODONE, polyethylene glycol, senna  Antimicrobials: Anti-infectives (From admission, onward)   Start     Dose/Rate Route Frequency Ordered Stop   01/06/20 1230  tobramycin (NEBCIN) 1.2 g powder    Note to Pharmacy: Rosanne Ashing   : cabinet override      01/06/20 1230 01/06/20 1310   01/06/20 1215  ceFAZolin (ANCEF) IVPB 2g/100 mL premix     2 g 200 mL/hr over 30 Minutes Intravenous  Once 01/06/20 1207 01/06/20 1301       I have personally reviewed the following labs and images: CBC: Recent Labs  Lab 01/04/20 0519 01/05/20 0916 01/06/20 0242 01/07/20 0302  WBC 6.8 5.8 6.1 6.0  HGB 13.4 14.7 13.2 13.5  HCT 40.6 44.8 39.9 40.9  MCV 91.6 92.0 90.9 91.3  PLT 246 246 233 239   BMP &GFR Recent Labs  Lab 01/04/20 0519 01/05/20 0916 01/06/20 0242 01/07/20 0302  NA 138 138 138 139  K 4.4 4.7 4.3 4.5  CL 107 104 108 104  CO2 23 24 23 25   GLUCOSE 101* 91 95 89  BUN 11 12 13 11   CREATININE 0.74 0.71 0.66 0.71  CALCIUM 8.6* 8.9 8.7* 9.0  MG 2.0 2.1 1.9 2.0   Estimated Creatinine Clearance: 52.7 mL/min (by C-G formula based on SCr of 0.71 mg/dL). Liver & Pancreas: No results for input(s): AST, ALT, ALKPHOS, BILITOT, PROT, ALBUMIN in the last 168 hours. No results for input(s): LIPASE, AMYLASE in the last 168 hours. No results for input(s): AMMONIA in the last 168 hours. Diabetic: No results for input(s): HGBA1C in the last 72 hours. No results for input(s): GLUCAP in the last 168 hours. Cardiac Enzymes: No results for input(s): CKTOTAL, CKMB, CKMBINDEX, TROPONINI in the last 168 hours. No results for input(s): PROBNP in the last 8760 hours. Coagulation Profile: Recent Labs  Lab 01/05/20 0916  INR 1.0   Thyroid Function Tests: No results for input(s): TSH, T4TOTAL, FREET4, T3FREE, THYROIDAB in the last 72 hours. Lipid Profile: No results for input(s): CHOL, HDL, LDLCALC, TRIG, CHOLHDL, LDLDIRECT in the last 72 hours. Anemia  Panel: No results for input(s): VITAMINB12, FOLATE, FERRITIN, TIBC, IRON, RETICCTPCT in the last 72 hours. Urine analysis:    Component Value Date/Time   COLORURINE YELLOW 12/26/2019 0926   APPEARANCEUR CLEAR 12/26/2019 0926   LABSPEC 1.011 12/26/2019 0926   PHURINE 6.0 12/26/2019 0926   GLUCOSEU NEGATIVE 12/26/2019 0926   GLUCOSEU NEGATIVE 10/13/2014 1615   HGBUR NEGATIVE 12/26/2019 0926   BILIRUBINUR NEGATIVE 12/26/2019 0926   KETONESUR NEGATIVE 12/26/2019 0926   PROTEINUR NEGATIVE 12/26/2019 0926   UROBILINOGEN 0.2 10/13/2014 1615   NITRITE NEGATIVE 12/26/2019 0926   LEUKOCYTESUR SMALL (A) 12/26/2019 0926   Sepsis Labs: Invalid input(s): PROCALCITONIN, LACTICIDVEN  Microbiology: Recent Results (from the past 240 hour(s))  SARS CORONAVIRUS 2 (TAT 6-24 HRS) Nasopharyngeal Nasopharyngeal Swab     Status: None   Collection Time: 01/03/20  1:25 PM   Specimen: Nasopharyngeal Swab  Result Value Ref Range Status   SARS  Coronavirus 2 NEGATIVE NEGATIVE Final    Comment: (NOTE) SARS-CoV-2 target nucleic acids are NOT DETECTED. The SARS-CoV-2 RNA is generally detectable in upper and lower respiratory specimens during the acute phase of infection. Negative results do not preclude SARS-CoV-2 infection, do not rule out co-infections with other pathogens, and should not be used as the sole basis for treatment or other patient management decisions. Negative results must be combined with clinical observations, patient history, and epidemiological information. The expected result is Negative. Fact Sheet for Patients: SugarRoll.be Fact Sheet for Healthcare Providers: https://www.woods-mathews.com/ This test is not yet approved or cleared by the [place] FDA and  has been authorized for detection and/or diagnosis of SARS-CoV-2 by FDA under an Emergency Use Authorization (EUA). This EUA will remain  in effect (meaning this test can be used) for  the duration of the COVID-19 declaration under Section 56 4(b)(1) of the Act, 21 U.S.C. section 360bbb-3(b)(1), unless the authorization is terminated or revoked sooner. Performed at [facility], [place] [address], [place], [place] [address]     Radiology Studies: No results found.    Jihaad Bruschi T. [place]  If 7PM-7AM, please contact night-coverage [email or web address] Password [facility] 01/10/2020, 12:08 PM

## 2020-01-10 NOTE — TOC Progression Note (Signed)
Transition of Care [facility]) - Progression Note    Patient Details  Name: Amy Weiss MRN: [id number] Date of Birth: June 04, 1935  Transition of Care [facility]) CM/SW Contact  Almeta Geisel, [place], [place] Phone Number: 01/10/2020, 5:13 PM  Clinical Narrative:    Phone call to patient's daughter to discuss bed offers. Patient's daughter declined current bed offers stating that she was concerned regarding their star rating. Patient's daughter stated that she has been in contact with the insurance company Administrator) and they have provided her with a list of additional facilities to follow up with including: [facility], [facility], [facility], [facility] and [facility].  Patient's bed offers reviewed with daughter. Patient's Fl2 faxed out to [facility] and [facility] through [employer] as requested. However referral's  will need to be faxed to [facility] and [employer] retirement as they are not on the hub Social work to follow up on additional requested facilities.  Johanthan Kneeland, LCSW Clinical Social Work [phone]    Expected Discharge Plan: Skilled Nursing Facility Barriers to Discharge: SNF Pending bed offer  Expected Discharge Plan and Services Expected Discharge Plan: Skilled Nursing Facility In-house Referral: Clinical Social Work Discharge Planning Services: CM Consult Post Acute Care Choice: Skilled Nursing Facility Living arrangements for the past 2 months: Single Family Home                 DME Arranged: N/A DME Agency: NA       HH Arranged: NA HH Agency: NA         Social Determinants of Health (SDOH) Interventions    Readmission Risk Interventions No flowsheet data found.

## 2020-01-11 ENCOUNTER — Inpatient Hospital Stay (HOSPITAL_COMMUNITY): Payer: Medicare HMO

## 2020-01-11 MED ORDER — CALCITONIN (SALMON) 200 UNIT/ACT NA SOLN
1.0000 | Freq: Every day | NASAL | Status: DC
  Administered 2020-01-11 – 2020-01-14 (×4): 1 via NASAL
  Filled 2020-01-11: qty 3.7

## 2020-01-11 MED ORDER — OXYCODONE HCL 5 MG PO TABS
5.0000 mg | ORAL_TABLET | Freq: Once | ORAL | Status: AC
  Administered 2020-01-11: 5 mg via ORAL
  Filled 2020-01-11: qty 1

## 2020-01-11 NOTE — Progress Notes (Signed)
PROGRESS NOTE  Amy Weiss HUD:[id number] DOB: 1935-06-08   PCP: Amy Plump, MD  Patient is from: Home.  Lives alone.  DOA: 01/02/2020 LOS: 8  Brief Narrative / Interim history: 85 year old female with past medical history ofdementia, hypothyroidism and recent fall leading to compression fracture of the T12/L1 vertebral area status post kyphoplasty.  At that time, therapy recommended SNF but patient and family decided to take her home with therapy.  Soon after discharge,patient had another fall and returned to hospital.  MRI lumbar spine revealed new L2 compression fracture with 10% height loss without retropulsion.  Case discussed with neurosurgery who recommended IR evaluation.  Patient had kyphoplasty on 2/16.   Patient's daughter and Amy Weiss, Amy Weiss now wants patient to be placed at [facility].  Patient refuses SNF placement but has no capacity to make reasonable medical decision.  Subjective: Complains about 6/10 pain in her lower back today.  She denies numbness, tingling, bowel or bladder issue.  Per patient's daughter at bedside, patient dropped quickly from standing to sitting while bed was at lowest position.  She thinks this might have aggravated her back pain.  No other complaints. Objective: Vitals:   01/10/20 2026 01/11/20 0442 01/11/20 0734 01/11/20 1052  BP: (!) 146/61 (!) 154/57 (!) 140/51 (!) 159/53  Pulse: (!) 42 (!) 49 (!) 41 (!) 42  Resp: 16 18 16 18   Temp: 97.7 F (36.5 C) 98.7 F (37.1 C) 97.7 F (36.5 C) 98.6 F (37 C)  TempSrc: Oral  Oral Oral  SpO2: 100% 98% 98% 100%  Weight:      Height:        Intake/Output Summary (Last 24 hours) at 01/11/2020 1112 Last data filed at 01/11/2020 0900 Gross per 24 hour  Intake 360 ml  Output --  Net 360 ml   Filed Weights   01/03/20 1408  Weight: 62.1 kg    Examination:  GENERAL: No acute distress.  Appears well.  HEENT: MMM.  Vision and hearing grossly intact.  NECK: Supple.  No apparent JVD.  RESP:  No  IWOB. Good air movement bilaterally. CVS:  RRR. Heart sounds normal.  ABD/GI/GU: Bowel sounds present. Soft. Non tender.  MSK/EXT:  Moves extremities. No apparent deformity. No edema.  SKIN: no apparent skin lesion or wound NEURO: Awake, alert and oriented fairly.  No apparent focal neuro deficit. PSYCH: Calm. Normal affect.  GENERAL: No acute distress.  Appears well.  HEENT: MMM.  Vision and hearing grossly intact.  NECK: Supple.  No apparent JVD.  RESP:  No IWOB. Good air movement bilaterally. CVS: Bradycardic to 40s. Heart sounds normal.  ABD/GI/GU: Bowel sounds present. Soft. Non tender.  MSK/EXT:  Moves extremities. No apparent deformity. No edema.  SKIN: no apparent skin lesion or wound NEURO: Awake, alert and oriented fairly. No apparent focal neuro deficit.  5/5 in BLE.  Light sensation intact. PSYCH: Calm. Normal affect.  Procedures:  2/16-kyphoplasty of L2 fracture  Assessment & Plan: Recurrent mechanical fall Closed compression fracture of L2 vertebrae, initial encounter Close compression fracture of T12-L1 vertebra, subsequent encounter Acute on chronic lower back pain without radiculopathy -Has had kyphoplasty of L2 fracture on 2/16. -Had kyphoplasty of T12-L1 prior admission. -Scheduled Tylenol and as needed oxycodone for pain control -Add calcitonin nasal spray -Continue vitamin D and calcium. -Will obtain lumbar films given her recent semi-fall "dropped quickly from standing to sitting position in her bed on 2/20" -Turned down by hospice-did not qualify. -OOB/PT/OT-recommended SNF  Chronic junctional bradycardia:  Chronic.  B/l HR in 30s per family and patient which seems to be right based on a EKG from 2015.  Improves with exertion.  Not on nodal blocking agent or Aricept.  TSH within normal.  Per patient and family, heart rate improves with movement and exertion.  Per daughter, declined pacemaker 5 years ago.  Hypoglycemic episode: A1c 5.7%.  -Monitor  intermittently  Dementia: Oriented to self, place and year.  Poor insight.  Lives alone. -Frequent orientation delirium precautions  Hypothyroidism: TSH within normal -Continue home Synthroid.                DVT prophylaxis: Start subcu Lovenox Code Status: DNR/DNI Family Communication: Updated patient's daughter at bedside  Discharge barrier: Safe disposition which would be SNF bed at this time. Patient is from: Home Final disposition: SNF when bed is available.  Medically stable for discharge.  Consultants: NS and IR (off)   Microbiology summarized: COVID-19 negative.  Sch Meds:  Scheduled Meds: . acetaminophen  1,000 mg Oral Q8H  . aspirin EC  81 mg Oral Daily  . calcitonin (salmon)  1 spray Alternating Nares Daily  . levothyroxine  88 mcg Oral QAC breakfast  . multivitamin with minerals  1 tablet Oral Daily  . senna-docusate  2 tablet Oral QHS  . Vitamin D (Ergocalciferol)  50,000 Units Oral Q7 days   Continuous Infusions: PRN Meds:.oxyCODONE, polyethylene glycol, senna  Antimicrobials: Anti-infectives (From admission, onward)   Start     Dose/Rate Route Frequency Ordered Stop   01/06/20 1230  tobramycin (NEBCIN) 1.2 g powder    Note to Pharmacy: Rosanne Ashing   : cabinet override      01/06/20 1230 01/06/20 1310   01/06/20 1215  ceFAZolin (ANCEF) IVPB 2g/100 mL premix     2 g 200 mL/hr over 30 Minutes Intravenous  Once 01/06/20 1207 01/06/20 1301       I have personally reviewed the following labs and images: CBC: Recent Labs  Lab 01/05/20 0916 01/06/20 0242 01/07/20 0302  WBC 5.8 6.1 6.0  HGB 14.7 13.2 13.5  HCT 44.8 39.9 40.9  MCV 92.0 90.9 91.3  PLT 246 233 239   BMP &GFR Recent Labs  Lab 01/05/20 0916 01/06/20 0242 01/07/20 0302  NA 138 138 139  K 4.7 4.3 4.5  CL 104 108 104  CO2 24 23 25   GLUCOSE 91 95 89  BUN 12 13 11   CREATININE 0.71 0.66 0.71  CALCIUM 8.9 8.7* 9.0  MG 2.1 1.9 2.0   Estimated Creatinine Clearance: 52.7  mL/min (by C-G formula based on SCr of 0.71 mg/dL). Liver & Pancreas: No results for input(s): AST, ALT, ALKPHOS, BILITOT, PROT, ALBUMIN in the last 168 hours. No results for input(s): LIPASE, AMYLASE in the last 168 hours. No results for input(s): AMMONIA in the last 168 hours. Diabetic: No results for input(s): HGBA1C in the last 72 hours. No results for input(s): GLUCAP in the last 168 hours. Cardiac Enzymes: No results for input(s): CKTOTAL, CKMB, CKMBINDEX, TROPONINI in the last 168 hours. No results for input(s): PROBNP in the last 8760 hours. Coagulation Profile: Recent Labs  Lab 01/05/20 0916  INR 1.0   Thyroid Function Tests: No results for input(s): TSH, T4TOTAL, FREET4, T3FREE, THYROIDAB in the last 72 hours. Lipid Profile: No results for input(s): CHOL, HDL, LDLCALC, TRIG, CHOLHDL, LDLDIRECT in the last 72 hours. Anemia Panel: No results for input(s): VITAMINB12, FOLATE, FERRITIN, TIBC, IRON, RETICCTPCT in the last 72 hours. Urine analysis:  Component Value Date/Time   COLORURINE YELLOW 12/26/2019 0926   APPEARANCEUR CLEAR 12/26/2019 0926   LABSPEC 1.011 12/26/2019 0926   PHURINE 6.0 12/26/2019 0926   GLUCOSEU NEGATIVE 12/26/2019 0926   GLUCOSEU NEGATIVE 10/13/2014 1615   HGBUR NEGATIVE 12/26/2019 0926   BILIRUBINUR NEGATIVE 12/26/2019 0926   KETONESUR NEGATIVE 12/26/2019 0926   PROTEINUR NEGATIVE 12/26/2019 0926   UROBILINOGEN 0.2 10/13/2014 1615   NITRITE NEGATIVE 12/26/2019 0926   LEUKOCYTESUR SMALL (A) 12/26/2019 0926   Sepsis Labs: Invalid input(s): PROCALCITONIN, [place]  Microbiology: Recent Results (from the past 240 hour(s))  SARS CORONAVIRUS 2 (TAT 6-24 HRS) Nasopharyngeal Nasopharyngeal Swab     Status: None   Collection Time: 01/03/20  1:25 PM   Specimen: Nasopharyngeal Swab  Result Value Ref Range Status   SARS Coronavirus 2 NEGATIVE NEGATIVE Final    Comment: (NOTE) SARS-CoV-2 target nucleic acids are NOT DETECTED. The SARS-CoV-2 RNA  is generally detectable in upper and lower respiratory specimens during the acute phase of infection. Negative results do not preclude SARS-CoV-2 infection, do not rule out co-infections with other pathogens, and should not be used as the sole basis for treatment or other patient management decisions. Negative results must be combined with clinical observations, patient history, and epidemiological information. The expected result is Negative. Fact Sheet for Patients: SugarRoll.be Fact Sheet for Healthcare Providers: https://www.woods-mathews.com/ This test is not yet approved or cleared by the [place] FDA and  has been authorized for detection and/or diagnosis of SARS-CoV-2 by FDA under an Emergency Use Authorization (EUA). This EUA will remain  in effect (meaning this test can be used) for the duration of the COVID-19 declaration under Section 56 4(b)(1) of the Act, 21 U.S.C. section 360bbb-3(b)(1), unless the authorization is terminated or revoked sooner. Performed at [facility], [place] [address] [place], [place] [address]     Radiology Studies: No results found.    Marnie Fazzino T. [place]  If 7PM-7AM, please contact night-coverage [email or web address] Password [facility] 01/11/2020, 11:12 AM

## 2020-01-11 NOTE — Progress Notes (Signed)
The early morning of the 20th pt was transfering back to bed from [facility] with the help of her daughter- dropped quickly from standing to sitting position- while bed was at lowest poistion-aggravating back pain-daughter voices concern that her mother's pain remains elevated(>3) since the incident and is requiring pain medications more frequently then prior to

## 2020-01-11 NOTE — TOC Progression Note (Signed)
Transition of Care [facility]) - Progression Note    Patient Details  Name: Amy Weiss MRN: [id number] Date of Birth: [birth year]  Transition of Care [facility]) CM/SW Contact  Baldemar Lenis, [place] Phone Number: 01/11/2020, 10:07 AM  Clinical Narrative:   CSW received call from patient's daughter, Amy Weiss, to discuss SNF bed offers. Amy Weiss had done additional research and wanted to add [facility] and [facility] to the list of SNF preferences. Referral already sent to [facility], which would be [name]'s first choice. [name] listed preferences as [facility], [facility], and [facility], in that order. Referrals are still pending for [facility] and [facility], CSW to reach out when Admissions is back in the office tomorrow to review referrals. Patient will then need insurance authorization started with [facility]. [name] appreciative of information. CSW to follow.    Expected Discharge Plan: Skilled Nursing Facility Barriers to Discharge: SNF Pending bed offer  Expected Discharge Plan and Services Expected Discharge Plan: Skilled Nursing Facility In-house Referral: Clinical Social Work Discharge Planning Services: CM Consult Post Acute Care Choice: Skilled Nursing Facility Living arrangements for the past 2 months: Single Family Home                 DME Arranged: N/A DME Agency: NA       HH Arranged: NA HH Agency: NA         Social Determinants of Health (SDOH) Interventions    Readmission Risk Interventions No flowsheet data found.

## 2020-01-11 NOTE — Progress Notes (Signed)
Instructed pt's daughter on raising and lowering bed to aid in easing in and out bed

## 2020-01-11 NOTE — Plan of Care (Signed)
  Problem: Education: Goal: Ability to verbalize activity precautions or restrictions will improve Outcome: Progressing Goal: Knowledge of the prescribed therapeutic regimen will improve Outcome: Progressing Goal: Understanding of discharge needs will improve Outcome: Progressing   Problem: Bowel/Gastric: Goal: Gastrointestinal status for postoperative course will improve Outcome: Progressing   Problem: Pain Management: Goal: Pain level will decrease Outcome: Progressing   Problem: Skin Integrity: Goal: Will show signs of wound healing Outcome: Progressing   Problem: Health Behavior/Discharge Planning: Goal: Identification of resources available to assist in meeting health care needs will improve Outcome: Progressing   Problem: Bladder/Genitourinary: Goal: Urinary functional status for postoperative course will improve Outcome: Progressing

## 2020-01-12 DIAGNOSIS — M549 Dorsalgia, unspecified: Secondary | ICD-10-CM

## 2020-01-12 DIAGNOSIS — G8929 Other chronic pain: Secondary | ICD-10-CM

## 2020-01-12 LAB — SARS CORONAVIRUS 2 (TAT 6-24 HRS): SARS Coronavirus 2: NEGATIVE

## 2020-01-12 MED ORDER — VITAMIN D (ERGOCALCIFEROL) 1.25 MG (50000 UNIT) PO CAPS: 50000.0000 [IU] | ORAL_CAPSULE | ORAL | Status: DC

## 2020-01-12 MED ORDER — OSCAL 500/200 D-3 500-200 MG-UNIT PO TABS: 1.0000 | ORAL_TABLET | Freq: Two times a day (BID) | ORAL | 3 refills | Status: DC

## 2020-01-12 MED ORDER — HYDROCODONE-ACETAMINOPHEN 5-325 MG PO TABS: 1.0000 | ORAL_TABLET | Freq: Four times a day (QID) | ORAL | 0 refills | Status: AC | PRN

## 2020-01-12 MED ORDER — DICLOFENAC SODIUM 1 % EX GEL
2.0000 g | Freq: Four times a day (QID) | CUTANEOUS | Status: DC
  Administered 2020-01-12 – 2020-01-14 (×7): 2 g via TOPICAL
  Filled 2020-01-12: qty 100

## 2020-01-12 MED ORDER — ACETAMINOPHEN 500 MG PO TABS: 1000.0000 mg | ORAL_TABLET | Freq: Four times a day (QID) | ORAL | 2 refills | Status: DC | PRN

## 2020-01-12 MED ORDER — DICLOFENAC SODIUM 1 % EX GEL: 2.0000 g | Freq: Four times a day (QID) | CUTANEOUS | Status: AC

## 2020-01-12 MED ORDER — ACETAMINOPHEN 325 MG PO TABS
650.0000 mg | ORAL_TABLET | Freq: Three times a day (TID) | ORAL | Status: DC
  Administered 2020-01-12 – 2020-01-14 (×6): 650 mg via ORAL
  Filled 2020-01-12 (×7): qty 2

## 2020-01-12 MED ORDER — HYDROCODONE-ACETAMINOPHEN 5-325 MG PO TABS
1.0000 | ORAL_TABLET | Freq: Four times a day (QID) | ORAL | Status: DC | PRN
  Administered 2020-01-12 – 2020-01-14 (×6): 1 via ORAL
  Filled 2020-01-12 (×7): qty 1

## 2020-01-12 MED ORDER — SENNOSIDES-DOCUSATE SODIUM 8.6-50 MG PO TABS: 2.0000 | ORAL_TABLET | Freq: Two times a day (BID) | ORAL | Status: DC | PRN

## 2020-01-12 MED ORDER — CALCITONIN (SALMON) 200 UNIT/ACT NA SOLN: 1.0000 | Freq: Every day | NASAL | 12 refills | Status: DC

## 2020-01-12 NOTE — Progress Notes (Signed)
PROGRESS NOTE  Amy Weiss:[id number] DOB: 07/19/1935   PCP: [name], MD  Patient is from: Home.  Lives alone.  DOA: 01/02/2020 LOS: 9  Brief Narrative / Interim history: 85 year old female with past medical history ofdementia, hypothyroidism and recent fall leading to compression fracture of the T12/L1 vertebral area status post kyphoplasty.  At that time, therapy recommended SNF but patient and family decided to take her home with therapy.  Soon after discharge,patient had another fall and returned to hospital.  MRI lumbar spine revealed new L2 compression fracture with 10% height loss without retropulsion.  Case discussed with neurosurgery who recommended IR evaluation.  Patient had kyphoplasty on 2/16.   Patient's daughter and [place], [name] now wants patient to be placed at [facility].   Subjective: No major events overnight or this morning.  Continues to endorse low back pain although improved with pain medications.  Currently she rates her pain 4/10.  Denies numbness, tingling, bowel or bladder issues.  Patient daughter at bedside. She would like to have pain medication and switch back to Vicodin.   Objective: Vitals:   01/11/20 1500 01/11/20 2003 01/12/20 0647 01/12/20 0855  BP: (!) 144/56 127/62 (!) 151/71 (!) 142/48  Pulse: (!) 43 (!) 40 (!) 43 (!) 55  Resp: 17 16 16 17   Temp: 98.5 F (36.9 C) 97.6 F (36.4 C) (!) 97.5 F (36.4 C) 97.8 F (36.6 C)  TempSrc: Oral Oral Oral Oral  SpO2: 100% 98% 97% 98%  Weight:      Height:        Intake/Output Summary (Last 24 hours) at 01/12/2020 1355 Last data filed at 01/11/2020 1900 Gross per 24 hour  Intake 120 ml  Output -  Net 120 ml   Filed Weights   01/03/20 1408  Weight: 62.1 kg    Examination: GENERAL: No acute distress.  Appears well.  HEENT: MMM.  Vision and hearing grossly intact.  NECK: Supple.  No apparent JVD.  RESP:  No IWOB. Good air movement bilaterally. CVS: HR in 40's. Heart sounds normal.   ABD/GI/GU: Bowel sounds present. Soft. Non tender.  MSK/EXT:  Moves extremities. No apparent deformity. No edema.  SKIN: no apparent skin lesion or wound NEURO: Awake, alert and oriented fairly.  5/5 strength is seen BLE.  Light sensation intact.  Patellar reflex symmetric PSYCH: Calm. Normal affect.  Procedures:  2/16-kyphoplasty of L2 fracture  Assessment & Plan: Recurrent mechanical fall Closed compression fracture of L2 vertebrae, initial encounter Close compression fracture of T12-L1 vertebra, subsequent encounter Acute on chronic lower back pain without radiculopathy -Has had kyphoplasty of L2 fracture on 2/16. -Had kyphoplasty of T12-L1 prior admission. -Lumbar film on 2/21 with no acute finding-reviewed by me and neuro IR, Dr. 3/21 -Scheduled Tylenol with as needed Vicodin -Calcitonin nasal spray and Voltaren gel -Continue vitamin D and calcium -OOB/PT/OT-recommended SNF-TOC searching  Chronic junctional bradycardia: Chronic.  B/l HR in 30s per family and patient which seems to be right based on a EKG from 2015.  Improves with exertion.  Not on nodal blocking agent or Aricept.  TSH within normal.  Per patient and family, heart rate improves with movement and exertion.  Per daughter, declined pacemaker 5 years ago.  Hypoglycemic episode: A1c 5.7%.  -Monitor intermittently  Dementia: Oriented to self, place and year.  Poor insight.  Lives alone. -Frequent orientation delirium precautions  Hypothyroidism: TSH within normal -Continue home Synthroid.  DVT prophylaxis: Start subcu Lovenox Code Status: DNR/DNI Family Communication: Updated patient's daughter at bedside  Discharge barrier: Safe disposition which would be SNF bed at this time. Patient is from: Home Final disposition: SNF when bed is available.  Medically stable for discharge.  Consultants: NS and IR (off)   Microbiology summarized: COVID-19 negative.  Sch Meds:  Scheduled  Meds: . acetaminophen  650 mg Oral Q8H  . aspirin EC  81 mg Oral Daily  . calcitonin (salmon)  1 spray Alternating Nares Daily  . diclofenac Sodium  2 g Topical QID  . levothyroxine  88 mcg Oral QAC breakfast  . multivitamin with minerals  1 tablet Oral Daily  . senna-docusate  2 tablet Oral QHS  . Vitamin D (Ergocalciferol)  50,000 Units Oral Q7 days   Continuous Infusions: PRN Meds:.HYDROcodone-acetaminophen, polyethylene glycol, senna  Antimicrobials: Anti-infectives (From admission, onward)   Start     Dose/Rate Route Frequency Ordered Stop   01/06/20 1230  tobramycin (NEBCIN) 1.2 g powder    Note to Pharmacy: Rosanne Ashing   : cabinet override      01/06/20 1230 01/06/20 1310   01/06/20 1215  ceFAZolin (ANCEF) IVPB 2g/100 mL premix     2 g 200 mL/hr over 30 Minutes Intravenous  Once 01/06/20 1207 01/06/20 1301       I have personally reviewed the following labs and images: CBC: Recent Labs  Lab 01/06/20 0242 01/07/20 0302  WBC 6.1 6.0  HGB 13.2 13.5  HCT 39.9 40.9  MCV 90.9 91.3  PLT 233 239   BMP &GFR Recent Labs  Lab 01/06/20 0242 01/07/20 0302  NA 138 139  K 4.3 4.5  CL 108 104  CO2 23 25  GLUCOSE 95 89  BUN 13 11  CREATININE 0.66 0.71  CALCIUM 8.7* 9.0  MG 1.9 2.0   Estimated Creatinine Clearance: 52.7 mL/min (by C-G formula based on SCr of 0.71 mg/dL). Liver & Pancreas: No results for input(s): AST, ALT, ALKPHOS, BILITOT, PROT, ALBUMIN in the last 168 hours. No results for input(s): LIPASE, AMYLASE in the last 168 hours. No results for input(s): AMMONIA in the last 168 hours. Diabetic: No results for input(s): HGBA1C in the last 72 hours. No results for input(s): GLUCAP in the last 168 hours. Cardiac Enzymes: No results for input(s): CKTOTAL, CKMB, CKMBINDEX, TROPONINI in the last 168 hours. No results for input(s): PROBNP in the last 8760 hours. Coagulation Profile: No results for input(s): INR, PROTIME in the last 168 hours. Thyroid  Function Tests: No results for input(s): TSH, T4TOTAL, FREET4, T3FREE, THYROIDAB in the last 72 hours. Lipid Profile: No results for input(s): CHOL, HDL, LDLCALC, TRIG, CHOLHDL, LDLDIRECT in the last 72 hours. Anemia Panel: No results for input(s): VITAMINB12, FOLATE, FERRITIN, TIBC, IRON, RETICCTPCT in the last 72 hours. Urine analysis:    Component Value Date/Time   COLORURINE YELLOW 12/26/2019 0926   APPEARANCEUR CLEAR 12/26/2019 0926   LABSPEC 1.011 12/26/2019 0926   PHURINE 6.0 12/26/2019 0926   GLUCOSEU NEGATIVE 12/26/2019 0926   GLUCOSEU NEGATIVE 10/13/2014 1615   HGBUR NEGATIVE 12/26/2019 0926   BILIRUBINUR NEGATIVE 12/26/2019 0926   KETONESUR NEGATIVE 12/26/2019 0926   PROTEINUR NEGATIVE 12/26/2019 0926   UROBILINOGEN 0.2 10/13/2014 1615   NITRITE NEGATIVE 12/26/2019 0926   LEUKOCYTESUR SMALL (A) 12/26/2019 0926   Sepsis Labs: Invalid input(s): PROCALCITONIN, LACTICIDVEN  Microbiology: Recent Results (from the past 240 hour(s))  SARS CORONAVIRUS 2 (TAT 6-24 HRS) Nasopharyngeal Nasopharyngeal Swab  Status: None   Collection Time: 01/03/20  1:25 PM   Specimen: Nasopharyngeal Swab  Result Value Ref Range Status   SARS Coronavirus 2 NEGATIVE NEGATIVE Final    Comment: (NOTE) SARS-CoV-2 target nucleic acids are NOT DETECTED. The SARS-CoV-2 RNA is generally detectable in upper and lower respiratory specimens during the acute phase of infection. Negative results do not preclude SARS-CoV-2 infection, do not rule out co-infections with other pathogens, and should not be used as the sole basis for treatment or other patient management decisions. Negative results must be combined with clinical observations, patient history, and epidemiological information. The expected result is Negative. Fact Sheet for Patients: SugarRoll.be Fact Sheet for Healthcare Providers: https://www.woods-mathews.com/ This test is not yet approved or  cleared by the [place] FDA and  has been authorized for detection and/or diagnosis of SARS-CoV-2 by FDA under an Emergency Use Authorization (EUA). This EUA will remain  in effect (meaning this test can be used) for the duration of the COVID-19 declaration under Section 56 4(b)(1) of the Act, 21 U.S.C. section 360bbb-3(b)(1), unless the authorization is terminated or revoked sooner. Performed at [facility], [place] [address], [place], [place] [address]     Radiology Studies: DG Lumbar Spine 2-3 Views  Result Date: 01/11/2020 CLINICAL DATA:  Fall, L2 compression fracture, prior vertebral augmentation EXAM: LUMBAR SPINE - 2-3 VIEW COMPARISON:  MRI lumbar spine dated 01/03/2020 FINDINGS: Normal lumbar lordosis. Prior vertebral augmentation at T12 and L1. Interval vertebral augmentation at L2. Mild (10-15%) loss of height at T12 and L2. Moderate (40%) loss of height at L1. No retropulsion. Visualized bony pelvis appears intact. IMPRESSION: Vertebral augmentation at T12-L2, with mild to moderate compression fracture deformities, as above. Electronically Signed   By: Julian Hy M.D.   On: 01/11/2020 15:31      Harneet Noblett T. [place]  If 7PM-7AM, please contact night-coverage [email or web address] Password [email or web address] 01/12/2020, 1:55 PM

## 2020-01-12 NOTE — Progress Notes (Signed)
Physical Therapy Treatment Patient Details Name: Amy Weiss MRN: [id number] DOB: 1934-12-04 Today's Date: 01/12/2020    History of Present Illness Patient is an 85 year old female with past medical history of dementia, hypothyroidism and recent fall leading to compression fracture of the T12/L1 vertebral area status post kyphoplasty.  Patient was discharged home with home health and soon after discharge, sustained a mechanical fall and had new back pain.  Patient was arranged as a direct admission and lumbar MRI noted no new findings at previous vertebroplasty area, but new fracture at L2.  Now s/p kyphoplasty on 2/16    PT Comments    Pt received in bed, agreeable to participation in therapy. She required supervision bed mobility, min guard assist transfers and min guard assist ambulation 225' with RW. Daughter present. Pt returned to bed at end of session. Declining to sit in recliner.    Follow Up Recommendations  SNF     Equipment Recommendations  None recommended by PT    Recommendations for Other Services       Precautions / Restrictions Precautions Precautions: Fall;Back Precaution Comments: cueing needed for log roll technique    Mobility  Bed Mobility Overal bed mobility: Needs Assistance Bed Mobility: Rolling;Sidelying to Sit;Sit to Sidelying Rolling: Supervision Sidelying to sit: Supervision     Sit to sidelying: Supervision General bed mobility comments: +rail, supervision for safety  Transfers Overall transfer level: Needs assistance Equipment used: Rolling walker (2 wheeled) Transfers: Sit to/from Stand Sit to Stand: Min guard         General transfer comment: min guard for safety  Ambulation/Gait Ambulation/Gait assistance: Min guard Gait Distance (Feet): 225 Feet Assistive device: Rolling walker (2 wheeled) Gait Pattern/deviations: Step-through pattern;Decreased stride length Gait velocity: decreased Gait velocity interpretation: 1.31 -  2.62 ft/sec, indicative of limited community ambulator General Gait Details: min guard assist for safety   Stairs             Wheelchair Mobility    Modified Rankin (Stroke Patients Only)       Balance Overall balance assessment: Needs assistance Sitting-balance support: Feet supported;No upper extremity supported Sitting balance-Leahy Scale: Good     Standing balance support: Bilateral upper extremity supported;During functional activity Standing balance-Leahy Scale: Poor Standing balance comment: RW for ambulation                            Cognition Arousal/Alertness: Awake/alert Behavior During Therapy: Flat affect Overall Cognitive Status: History of cognitive impairments - at baseline                                 General Comments: Mildly agitated and impulsive. Daughter present and reports cognition at baseline.      Exercises      General Comments        Pertinent Vitals/Pain Pain Assessment: 0-10 Pain Score: 7  Pain Location: back and R hip Pain Descriptors / Indicators: Discomfort;Sore Pain Intervention(s): Monitored during session    Home Living                      Prior Function            PT Goals (current goals can now be found in the care plan section) Acute Rehab PT Goals Patient Stated Goal: home Progress towards PT goals: Progressing toward goals    Frequency  Min 3X/week      PT Plan Current plan remains appropriate    Co-evaluation              AM-PAC PT "6 Clicks" Mobility   Outcome Measure  Help needed turning from your back to your side while in a flat bed without using bedrails?: None Help needed moving from lying on your back to sitting on the side of a flat bed without using bedrails?: None Help needed moving to and from a bed to a chair (including a wheelchair)?: A Little Help needed standing up from a chair using your arms (e.g., wheelchair or bedside chair)?: A  Little Help needed to walk in hospital room?: A Little Help needed climbing 3-5 steps with a railing? : A Little 6 Click Score: 20    End of Session Equipment Utilized During Treatment: Gait belt Activity Tolerance: Patient tolerated treatment well Patient left: in bed;with call bell/phone within reach;with family/visitor present Nurse Communication: Mobility status PT Visit Diagnosis: Other abnormalities of gait and mobility (R26.89);Pain     Time: 4665-9935 PT Time Calculation (min) (ACUTE ONLY): 13 min  Charges:  $Gait Training: 8-22 mins                     Lorrin Goodell, [place]  Office # [phone] Pager [phone]    Lorriane Shire 01/12/2020, 12:36 PM

## 2020-01-12 NOTE — Progress Notes (Signed)
Patient ID: Amy Weiss, female   DOB: [birth year], 85 y.o.   MRN: [id number]  This NP visited patient at the bedside as a follow up for palliative medicine needs and emotional support.  Patient's daughter is at bedside.  Patient is alert  and her main focus continues to be  "when can I go home"   She has limited insight into her current medical situation and care needs.  Continued conversation with daughter/[name] regarding current medical situation, underlying co-morbidities,  specific to her continued cognitive decline and overall fraility and the impact on likely medical trajectory.   Daughter tells me current plan is SNF for rehab, await COVID test.  [facility] is being consisdered  Family is putting together a care plan for when patient is discharged from SNF, keeping her at home is a priority.  Emotional support to daughter at bedside, again creating space and opportunity for family to explore thoughts and feelings regarding the current situation.  MOST form updated to reflect consideration  to trial  IV fluids in event os need. Hard Copy placed in chart and copy given to daughter  Questions and concerns addressed    Total time spent on the unit was 25  minutes   Palliative medicine will continue to support holistically  Greater than 50% of the time was spent in counseling and coordination of care  [name] NP  Palliative Medicine Team Team Phone # [phone] Pager [phone]

## 2020-01-12 NOTE — TOC Progression Note (Addendum)
Transition of Care [facility]) - Progression Note    Patient Details  Name: Amy Weiss MRN: [id number] Date of Birth: December 01, 1934  Transition of Care [facility]) CM/SW Contact  Okey Dupre Lazaro Arms, LCSW Phone Number: 01/12/2020, 3:25 PM  Clinical Narrative:  Talked with daughter, Amy Weiss [phone]) this morning regarding facility selection for patient and initially daughter interested in [facility]. Call made to SNF and message left. CSW received a call from [facility] main office advising CSW that daughter dies not [facility], but is interested in [facility]. Call made to [facility] (3:20 pm) and message left.  Received call (3:32 pm) from admissions director with [facility]. She can take patient and will initiate authorization with [employer]. Call made to daughter, Amy Weiss and update provided. CSW will continue to follow and facilitate discharge to [facility] when auth received.    Expected Discharge Plan: Skilled Nursing Facility Barriers to Discharge: SNF Pending bed offer  Expected Discharge Plan and Services Expected Discharge Plan: Skilled Nursing Facility In-house Referral: Clinical Social Work Discharge Planning Services: CM Consult Post Acute Care Choice: Skilled Nursing Facility Living arrangements for the past 2 months: Single Family Home                 DME Arranged: N/A DME Agency: NA       HH Arranged: NA HH Agency: NA         Social Determinants of Health (SDOH) Interventions  No SDOH interventions   Readmission Risk Interventions No flowsheet data found.

## 2020-01-13 NOTE — Progress Notes (Signed)
PROGRESS NOTE  Amy Weiss JQZ:[id number] DOB: 12/30/1934   PCP: [name], MD  Patient is from: Home.  Lives alone.  DOA: 01/02/2020 LOS: 10  Brief Narrative / Interim history: 85 year old female with past medical history ofdementia, hypothyroidism and recent fall leading to compression fracture of the T12/L1 vertebral area status post kyphoplasty.  At that time, therapy recommended SNF but patient and family decided to take her home with therapy.  Soon after discharge,patient had another fall and returned to hospital.  MRI lumbar spine revealed new L2 compression fracture with 10% height loss without retropulsion.  Case discussed with neurosurgery who recommended IR evaluation.  Patient had kyphoplasty on 2/16.   Patient's daughter and [place], [name] now wants patient to be placed at [facility].   Subjective: No major events overnight or this morning.  Reports lower back pain.  She rates her pain 5/10.  No numbness, tingling or weakness in her legs.  Denies bowel or bladder issues.  Objective: Vitals:   01/12/20 1531 01/12/20 2109 01/13/20 0659 01/13/20 0836  BP: (!) 151/56 (!) 144/61 108/89 (!) 141/66  Pulse: (!) 40 (!) 43 (!) 45 (!) 50  Resp:  16 18 17   Temp: 98.5 F (36.9 C) 98.4 F (36.9 C) 97.8 F (36.6 C) 97.9 F (36.6 C)  TempSrc: Oral Oral Oral Oral  SpO2: 98% 94% 97% 96%  Weight:      Height:        Intake/Output Summary (Last 24 hours) at 01/13/2020 1234 Last data filed at 01/13/2020 0900 Gross per 24 hour  Intake 120 ml  Output 3 ml  Net 117 ml   Filed Weights   01/03/20 1408  Weight: 62.1 kg    Examination:  GENERAL: No acute distress.  Appears well.  HEENT: MMM.  Vision and hearing grossly intact.  NECK: Supple.  No apparent JVD.  RESP:  No IWOB. Good air movement bilaterally. CVS: Bradycardia.01/05/20 Heart sounds normal.  ABD/GI/GU: Bowel sounds present. Soft. Non tender.  MSK/EXT:  Moves extremities. No apparent deformity. No edema.  SKIN: no  apparent skin lesion or wound NEURO: Awake, alert and oriented fairly.  No apparent focal neuro deficit. PSYCH: Calm. Normal affect  Procedures:  2/16-kyphoplasty of L2 fracture  Assessment & Plan: Recurrent mechanical fall Closed compression fracture of L2 vertebrae, initial encounter Close compression fracture of T12-L1 vertebra, subsequent encounter Acute on chronic lower back pain without radiculopathy -Has had kyphoplasty of L2 fracture on 2/16. -Had kyphoplasty of T12-L1 prior admission. -Lumbar film on 2/21 with no acute finding-reviewed by me and neuro IR, Dr. 3/21 -Scheduled Tylenol with as needed Vicodin -Calcitonin nasal spray and Voltaren gel -Continue vitamin D and calcium -OOB/PT/OT-recommended SNF-waiting on insurance authorization  Chronic junctional bradycardia: Chronic.  B/l HR in 30s per family and patient which seems to be right based on a EKG from 2015.  Improves with exertion.  Not on nodal blocking agent or Aricept.  TSH within normal.  Per patient and family, heart rate improves with movement and exertion.  Per daughter, declined pacemaker 5 years ago.  Hypoglycemic episode: A1c 5.7%.  -Monitor intermittently  Dementia: Oriented to self, place and year.  Poor insight.  Lives alone. -Frequent orientation delirium precautions  Hypothyroidism: TSH within normal -Continue home Synthroid.                DVT prophylaxis: Start subcu Lovenox Code Status: DNR/DNI Family Communication: Updated patient's daughter at bedside  Discharge barrier: Insurance authorization for SNF bed.  Patient is from: Home Final disposition: SNF  Consultants: NS and IR (off)   Microbiology summarized: [id number] negative.  Sch Meds:  Scheduled Meds: . acetaminophen  650 mg Oral Q8H  . aspirin EC  81 mg Oral Daily  . calcitonin (salmon)  1 spray Alternating Nares Daily  . diclofenac Sodium  2 g Topical QID  . levothyroxine  88 mcg Oral QAC breakfast  .  multivitamin with minerals  1 tablet Oral Daily  . senna-docusate  2 tablet Oral QHS  . Vitamin D (Ergocalciferol)  50,000 Units Oral Q7 days   Continuous Infusions: PRN Meds:.HYDROcodone-acetaminophen, polyethylene glycol, senna  Antimicrobials: Anti-infectives (From admission, onward)   Start     Dose/Rate Route Frequency Ordered Stop   01/06/20 1230  tobramycin (NEBCIN) 1.2 g powder    Note to Pharmacy: Armando Reichert   : cabinet override      01/06/20 1230 01/06/20 1310   01/06/20 1215  ceFAZolin (ANCEF) IVPB 2g/100 mL premix     2 g 200 mL/hr over 30 Minutes Intravenous  Once 01/06/20 1207 01/06/20 1301       I have personally reviewed the following labs and images: CBC: Recent Labs  Lab 01/07/20 0302  WBC 6.0  HGB 13.5  HCT 40.9  MCV 91.3  PLT 239   BMP &GFR Recent Labs  Lab 01/07/20 0302  NA 139  K 4.5  CL 104  CO2 25  GLUCOSE 89  BUN 11  CREATININE 0.71  CALCIUM 9.0  MG 2.0   Estimated Creatinine Clearance: 52.7 mL/min (by C-G formula based on SCr of 0.71 mg/dL). Liver & Pancreas: No results for input(s): AST, ALT, ALKPHOS, BILITOT, PROT, ALBUMIN in the last 168 hours. No results for input(s): LIPASE, AMYLASE in the last 168 hours. No results for input(s): AMMONIA in the last 168 hours. Diabetic: No results for input(s): HGBA1C in the last 72 hours. No results for input(s): GLUCAP in the last 168 hours. Cardiac Enzymes: No results for input(s): CKTOTAL, CKMB, CKMBINDEX, TROPONINI in the last 168 hours. No results for input(s): PROBNP in the last 8760 hours. Coagulation Profile: No results for input(s): INR, PROTIME in the last 168 hours. Thyroid Function Tests: No results for input(s): TSH, T4TOTAL, FREET4, T3FREE, THYROIDAB in the last 72 hours. Lipid Profile: No results for input(s): CHOL, HDL, LDLCALC, TRIG, CHOLHDL, LDLDIRECT in the last 72 hours. Anemia Panel: No results for input(s): VITAMINB12, FOLATE, FERRITIN, TIBC, IRON, RETICCTPCT in  the last 72 hours. Urine analysis:    Component Value Date/Time   COLORURINE YELLOW 12/26/2019 0926   APPEARANCEUR CLEAR 12/26/2019 0926   LABSPEC 1.011 12/26/2019 0926   PHURINE 6.0 12/26/2019 0926   GLUCOSEU NEGATIVE 12/26/2019 0926   GLUCOSEU NEGATIVE 10/13/2014 1615   HGBUR NEGATIVE 12/26/2019 0926   BILIRUBINUR NEGATIVE 12/26/2019 0926   KETONESUR NEGATIVE 12/26/2019 0926   PROTEINUR NEGATIVE 12/26/2019 0926   UROBILINOGEN 0.2 10/13/2014 1615   NITRITE NEGATIVE 12/26/2019 0926   LEUKOCYTESUR SMALL (A) 12/26/2019 0926   Sepsis Labs: Invalid input(s): PROCALCITONIN, [place]  Microbiology: Recent Results (from the past 240 hour(s))  SARS CORONAVIRUS 2 (TAT 6-24 HRS) Nasopharyngeal Nasopharyngeal Swab     Status: None   Collection Time: 01/03/20  1:25 PM   Specimen: Nasopharyngeal Swab  Result Value Ref Range Status   SARS Coronavirus 2 NEGATIVE NEGATIVE Final    Comment: (NOTE) SARS-CoV-2 target nucleic acids are NOT DETECTED. The SARS-CoV-2 RNA is generally detectable in upper and lower respiratory specimens during the  acute phase of infection. Negative results do not preclude SARS-CoV-2 infection, do not rule out co-infections with other pathogens, and should not be used as the sole basis for treatment or other patient management decisions. Negative results must be combined with clinical observations, patient history, and epidemiological information. The expected result is Negative. Fact Sheet for Patients: HairSlick.no Fact Sheet for Healthcare Providers: quierodirigir.com This test is not yet approved or cleared by the [place] FDA and  has been authorized for detection and/or diagnosis of SARS-CoV-2 by FDA under an Emergency Use Authorization (EUA). This EUA will remain  in effect (meaning this test can be used) for the duration of the COVID-19 declaration under Section 56 4(b)(1) of the Act, 21 U.S.C.  section 360bbb-3(b)(1), unless the authorization is terminated or revoked sooner. Performed at [facility], [address], [place], [place] [address]   SARS CORONAVIRUS 2 (TAT 6-24 HRS) Nasopharyngeal Nasopharyngeal Swab     Status: None   Collection Time: 01/12/20  7:22 AM   Specimen: Nasopharyngeal Swab  Result Value Ref Range Status   SARS Coronavirus 2 NEGATIVE NEGATIVE Final    Comment: (NOTE) SARS-CoV-2 target nucleic acids are NOT DETECTED. The SARS-CoV-2 RNA is generally detectable in upper and lower respiratory specimens during the acute phase of infection. Negative results do not preclude SARS-CoV-2 infection, do not rule out co-infections with other pathogens, and should not be used as the sole basis for treatment or other patient management decisions. Negative results must be combined with clinical observations, patient history, and epidemiological information. The expected result is Negative. Fact Sheet for Patients: HairSlick.no Fact Sheet for Healthcare Providers: quierodirigir.com This test is not yet approved or cleared by the [place] FDA and  has been authorized for detection and/or diagnosis of SARS-CoV-2 by FDA under an Emergency Use Authorization (EUA). This EUA will remain  in effect (meaning this test can be used) for the duration of the COVID-19 declaration under Section 56 4(b)(1) of the Act, 21 U.S.C. section 360bbb-3(b)(1), unless the authorization is terminated or revoked sooner. Performed at [facility], [address] [place], [place] [address]     Radiology Studies: No results found.    Kamran Coker T. Sherlynn Tour[facility]  If 7PM-7AM, please contact night-coverage [email or web address] Password [facility] 01/13/2020, 12:34 PM

## 2020-01-13 NOTE — Plan of Care (Signed)
  Problem: Education: Goal: Ability to verbalize activity precautions or restrictions will improve Outcome: Progressing Goal: Knowledge of the prescribed therapeutic regimen will improve Outcome: Progressing   Problem: Activity: Goal: Ability to avoid complications of mobility impairment will improve Outcome: Progressing Goal: Ability to tolerate increased activity will improve Outcome: Progressing Goal: Will remain free from falls Outcome: Progressing   Problem: Bowel/Gastric: Goal: Gastrointestinal status for postoperative course will improve Outcome: Progressing   Problem: Clinical Measurements: Goal: Ability to maintain clinical measurements within normal limits will improve Outcome: Progressing Goal: Postoperative complications will be avoided or minimized Outcome: Progressing   Problem: Pain Management: Goal: Pain level will decrease Outcome: Progressing   Problem: Skin Integrity: Goal: Will show signs of wound healing Outcome: Progressing   Problem: Education: Goal: Knowledge of General Education information will improve Description: Including pain rating scale, medication(s)/side effects and non-pharmacologic comfort measures Outcome: Progressing   Problem: Clinical Measurements: Goal: Will remain free from infection Outcome: Progressing   Problem: Pain Managment: Goal: General experience of comfort will improve Outcome: Progressing   Problem: Safety: Goal: Ability to remain free from injury will improve Outcome: Progressing   Problem: Skin Integrity: Goal: Risk for impaired skin integrity will decrease Outcome: Progressing

## 2020-01-13 NOTE — Progress Notes (Signed)
While rounding on the unit I saw Mrs. Zemaitis daughter who saw my Chaplain badge and inquired about a notary for a document for her mother.  Inquiring about the document I learned she wanted an automobile title notarized.  I explained in regret Chaplains are not authorized to [employer].  We mostly notarize Advance Directives and I can help with that.  Daughter indicated her mother has one of those.  Daughter was grateful for information and indicated she will pursue notary of auto title elsewhere.   Vernell Morgans Chaplain Resident

## 2020-01-13 NOTE — Plan of Care (Signed)
  Problem: Education: Goal: Ability to verbalize activity precautions or restrictions will improve Outcome: Progressing   Problem: Activity: Goal: Ability to avoid complications of mobility impairment will improve Outcome: Progressing   Problem: Pain Management: Goal: Pain level will decrease Outcome: Progressing   Problem: Skin Integrity: Goal: Will show signs of wound healing Outcome: Progressing   

## 2020-01-13 NOTE — Plan of Care (Signed)
  Problem: Education: Goal: Understanding of discharge needs will improve Outcome: Progressing   Problem: Activity: Goal: Ability to tolerate increased activity will improve Outcome: Progressing Goal: Will remain free from falls Outcome: Progressing   Problem: Pain Management: Goal: Pain level will decrease Outcome: Progressing

## 2020-01-13 NOTE — Progress Notes (Signed)
Occupational Therapy Treatment Patient Details Name: HONESTY MENTA MRN: [id number] DOB: 09-19-1935 Today's Date: 01/13/2020    History of present illness Patient is an 85 year old female with past medical history of dementia, hypothyroidism and recent fall leading to compression fracture of the T12/L1 vertebral area status post kyphoplasty.  Patient was discharged home with home health and soon after discharge, sustained a mechanical fall and had new back pain.  Patient was arranged as a direct admission and lumbar MRI noted no new findings at previous vertebroplasty area, but new fracture at L2.  Now s/p kyphoplasty on 2/16   OT comments  Pt making progress with functional goals  despite requiring encouragement to participate in OOB activity. Session focused on ADLs, grooming standing at sink, toilet transfers, toileting and ADL mobility using RW. Pt refused to sit up in recliner after toileting tasks in bathroom. OT will continue to follow acutely  Follow Up Recommendations  SNF;Supervision/Assistance - 24 hour    Equipment Recommendations  None recommended by OT    Recommendations for Other Services      Precautions / Restrictions Precautions Precautions: Fall;Back Precaution Comments: cueing needed for log roll technique, cues for no bending duirng stand - sit to toilet Spinal Brace: (no brace ordered) Restrictions Weight Bearing Restrictions: No       Mobility Bed Mobility Overal bed mobility: Needs Assistance Bed Mobility: Rolling;Sidelying to Sit;Sit to Sidelying Rolling: Supervision Sidelying to sit: Supervision     Sit to sidelying: Supervision General bed mobility comments: +rail, supervision for safety  Transfers Overall transfer level: Needs assistance Equipment used: Rolling walker (2 wheeled) Transfers: Sit to/from Stand Sit to Stand: Min guard Stand pivot transfers: Min guard       General transfer comment: min guard for safety    Balance Overall  balance assessment: Needs assistance Sitting-balance support: Feet supported;No upper extremity supported Sitting balance-Leahy Scale: Good     Standing balance support: Bilateral upper extremity supported;During functional activity Standing balance-Leahy Scale: Poor                             ADL either performed or assessed with clinical judgement   ADL Overall ADL's : Needs assistance/impaired Eating/Feeding: Independent;Sitting;Bed level   Grooming: Wash/dry hands;Wash/dry face;Min guard;Standing   Upper Body Bathing: Min guard;Sitting Upper Body Bathing Details (indicate cue type and reason): simulated Lower Body Bathing: Moderate assistance;Sitting/lateral leans Lower Body Bathing Details (indicate cue type and reason): simulated Upper Body Dressing : Min guard;Sitting       Toilet Transfer: Min guard;Ambulation;RW;Cueing for safety   Toileting- Clothing Manipulation and Hygiene: Min guard;Sit to/from stand       Functional mobility during ADLs: Min guard;Rolling walker;Cueing for sequencing General ADL Comments: pt refused to sit up in recliner after toileting tasks in bathroom     Vision Baseline Vision/History: Wears glasses Wears Glasses: Reading only Patient Visual Report: No change from baseline     Perception     Praxis      Cognition Arousal/Alertness: Awake/alert Behavior During Therapy: WFL for tasks assessed/performed Overall Cognitive Status: History of cognitive impairments - at baseline Area of Impairment: Memory;Safety/judgement;Problem solving                     Memory: Decreased short-term memory;Decreased recall of precautions   Safety/Judgement: Decreased awareness of deficits;Decreased awareness of safety     General Comments: Mildly agitated and impulsive---baseline  Exercises     Shoulder Instructions       General Comments      Pertinent Vitals/ Pain       Pain Assessment: Faces Faces Pain  Scale: Hurts little more Pain Location: back Pain Descriptors / Indicators: Discomfort;Sore;Grimacing Pain Intervention(s): Monitored during session;Premedicated before session;Repositioned  Home Living                                          Prior Functioning/Environment              Frequency  Min 2X/week        Progress Toward Goals  OT Goals(current goals can now be found in the care plan section)  Progress towards OT goals: Progressing toward goals     Plan Discharge plan remains appropriate    Co-evaluation                 AM-PAC OT "6 Clicks" Daily Activity     Outcome Measure   Help from another person eating meals?: None Help from another person taking care of personal grooming?: None Help from another person toileting, which includes using toliet, bedpan, or urinal?: A Little Help from another person bathing (including washing, rinsing, drying)?: A Lot Help from another person to put on and taking off regular upper body clothing?: A Little Help from another person to put on and taking off regular lower body clothing?: A Lot 6 Click Score: 18    End of Session Equipment Utilized During Treatment: Gait belt;Rolling walker  OT Visit Diagnosis: Muscle weakness (generalized) (M62.81);Other symptoms and signs involving cognitive function;Pain Pain - part of body: (back)   Activity Tolerance Patient limited by pain   Patient Left with call bell/phone within reach;in bed   Nurse Communication          Time: 1047-1110 OT Time Calculation (min): 23 min  Charges: OT General Charges $OT Visit: 1 Visit OT Treatments $Self Care/Home Management : 8-22 mins $Therapeutic Activity: 8-22 mins      Galen Manila 01/13/2020, 11:28 AM

## 2020-01-14 DIAGNOSIS — Z8679 Personal history of other diseases of the circulatory system: Secondary | ICD-10-CM | POA: Diagnosis not present

## 2020-01-14 DIAGNOSIS — Z79891 Long term (current) use of opiate analgesic: Secondary | ICD-10-CM | POA: Diagnosis not present

## 2020-01-14 DIAGNOSIS — R001 Bradycardia, unspecified: Secondary | ICD-10-CM | POA: Diagnosis not present

## 2020-01-14 DIAGNOSIS — M4856XA Collapsed vertebra, not elsewhere classified, lumbar region, initial encounter for fracture: Secondary | ICD-10-CM | POA: Diagnosis not present

## 2020-01-14 DIAGNOSIS — I959 Hypotension, unspecified: Secondary | ICD-10-CM | POA: Diagnosis not present

## 2020-01-14 DIAGNOSIS — K5900 Constipation, unspecified: Secondary | ICD-10-CM | POA: Diagnosis not present

## 2020-01-14 DIAGNOSIS — E039 Hypothyroidism, unspecified: Secondary | ICD-10-CM | POA: Diagnosis not present

## 2020-01-14 DIAGNOSIS — M6281 Muscle weakness (generalized): Secondary | ICD-10-CM | POA: Diagnosis not present

## 2020-01-14 DIAGNOSIS — Z515 Encounter for palliative care: Secondary | ICD-10-CM | POA: Diagnosis not present

## 2020-01-14 DIAGNOSIS — S32020D Wedge compression fracture of second lumbar vertebra, subsequent encounter for fracture with routine healing: Secondary | ICD-10-CM | POA: Diagnosis not present

## 2020-01-14 DIAGNOSIS — D649 Anemia, unspecified: Secondary | ICD-10-CM | POA: Diagnosis not present

## 2020-01-14 DIAGNOSIS — R319 Hematuria, unspecified: Secondary | ICD-10-CM | POA: Diagnosis not present

## 2020-01-14 DIAGNOSIS — R6889 Other general symptoms and signs: Secondary | ICD-10-CM | POA: Diagnosis not present

## 2020-01-14 DIAGNOSIS — R52 Pain, unspecified: Secondary | ICD-10-CM | POA: Diagnosis not present

## 2020-01-14 DIAGNOSIS — G8929 Other chronic pain: Secondary | ICD-10-CM | POA: Diagnosis not present

## 2020-01-14 DIAGNOSIS — M545 Low back pain: Secondary | ICD-10-CM | POA: Diagnosis not present

## 2020-01-14 DIAGNOSIS — Z7401 Bed confinement status: Secondary | ICD-10-CM | POA: Diagnosis not present

## 2020-01-14 DIAGNOSIS — S32020A Wedge compression fracture of second lumbar vertebra, initial encounter for closed fracture: Secondary | ICD-10-CM | POA: Diagnosis not present

## 2020-01-14 DIAGNOSIS — M810 Age-related osteoporosis without current pathological fracture: Secondary | ICD-10-CM | POA: Diagnosis not present

## 2020-01-14 DIAGNOSIS — N390 Urinary tract infection, site not specified: Secondary | ICD-10-CM | POA: Diagnosis not present

## 2020-01-14 DIAGNOSIS — M2550 Pain in unspecified joint: Secondary | ICD-10-CM | POA: Diagnosis not present

## 2020-01-14 DIAGNOSIS — S32000D Wedge compression fracture of unspecified lumbar vertebra, subsequent encounter for fracture with routine healing: Secondary | ICD-10-CM | POA: Diagnosis not present

## 2020-01-14 DIAGNOSIS — M47816 Spondylosis without myelopathy or radiculopathy, lumbar region: Secondary | ICD-10-CM | POA: Diagnosis not present

## 2020-01-14 DIAGNOSIS — M4854XA Collapsed vertebra, not elsewhere classified, thoracic region, initial encounter for fracture: Secondary | ICD-10-CM | POA: Diagnosis not present

## 2020-01-14 DIAGNOSIS — M549 Dorsalgia, unspecified: Secondary | ICD-10-CM | POA: Diagnosis not present

## 2020-01-14 DIAGNOSIS — X58XXXA Exposure to other specified factors, initial encounter: Secondary | ICD-10-CM | POA: Diagnosis not present

## 2020-01-14 DIAGNOSIS — Z66 Do not resuscitate: Secondary | ICD-10-CM | POA: Diagnosis not present

## 2020-01-14 DIAGNOSIS — W19XXXD Unspecified fall, subsequent encounter: Secondary | ICD-10-CM | POA: Diagnosis not present

## 2020-01-14 DIAGNOSIS — E7849 Other hyperlipidemia: Secondary | ICD-10-CM | POA: Diagnosis not present

## 2020-01-14 DIAGNOSIS — E568 Deficiency of other vitamins: Secondary | ICD-10-CM | POA: Diagnosis not present

## 2020-01-14 DIAGNOSIS — R69 Illness, unspecified: Secondary | ICD-10-CM | POA: Diagnosis not present

## 2020-01-14 DIAGNOSIS — G8911 Acute pain due to trauma: Secondary | ICD-10-CM | POA: Diagnosis not present

## 2020-01-14 DIAGNOSIS — R4182 Altered mental status, unspecified: Secondary | ICD-10-CM | POA: Diagnosis not present

## 2020-01-14 DIAGNOSIS — R7303 Prediabetes: Secondary | ICD-10-CM | POA: Diagnosis not present

## 2020-01-14 DIAGNOSIS — Z9181 History of falling: Secondary | ICD-10-CM | POA: Diagnosis not present

## 2020-01-14 DIAGNOSIS — R296 Repeated falls: Secondary | ICD-10-CM | POA: Diagnosis not present

## 2020-01-14 DIAGNOSIS — R4189 Other symptoms and signs involving cognitive functions and awareness: Secondary | ICD-10-CM | POA: Diagnosis not present

## 2020-01-14 DIAGNOSIS — M5489 Other dorsalgia: Secondary | ICD-10-CM | POA: Diagnosis not present

## 2020-01-14 DIAGNOSIS — R531 Weakness: Secondary | ICD-10-CM | POA: Diagnosis not present

## 2020-01-14 DIAGNOSIS — S32059A Unspecified fracture of fifth lumbar vertebra, initial encounter for closed fracture: Secondary | ICD-10-CM | POA: Diagnosis not present

## 2020-01-14 MED ORDER — LORAZEPAM 0.5 MG PO TABS
0.5000 mg | ORAL_TABLET | ORAL | Status: DC | PRN
  Administered 2020-01-14: 0.5 mg via ORAL
  Filled 2020-01-14: qty 1

## 2020-01-14 MED ORDER — ADULT MULTIVITAMIN W/MINERALS CH: 1.0000 | ORAL_TABLET | Freq: Every day | ORAL | Status: DC

## 2020-01-14 NOTE — TOC Transition Note (Signed)
Transition of Care [facility]) - CM/SW Discharge Note   Patient Details  Name: Amy Weiss MRN: [id number] Date of Birth: [birth year]  [facility] CM/SW Contact:  Truddie Hidden, LCSW Phone Number: 01/14/2020, 12:42 PM   Clinical Narrative:    Discharged to SNF. Referral coordinated with Inetta Fermo. Patient's daughter [name] aware and agreeable to this plan. Number to call report [phone] room [phone] ask for 600 hall nurse given to unit RN Sophia. DC summary sent via Epic to inbasket. No other needs at this time. Case closed to this CSW.    Final next level of care: Skilled Nursing Facility Barriers to Discharge: Barriers Resolved   Patient Goals and CMS Choice Patient states their goals for this hospitalization and ongoing recovery are:: skilled rehab to get stronger before home CMS Medicare.gov Compare Post Acute Care list provided to:: Patient Choice offered to / list presented to : Adult Children, Patient  Discharge Placement   Existing PASRR number confirmed : 01/14/20          Patient chooses bed at: [facility] [place] Patient to be transferred to facility by: [facility] Name of family member notified: [name] t Patient and family notified of of transfer: 01/14/20  Discharge Plan and Services In-house Referral: Clinical Social Work Discharge Planning Services: [employer] [facility] Post Acute Care Choice: Skilled Nursing Facility          DME Arranged: N/A DME Agency: NA       HH Arranged: NA HH Agency: NA        Social Determinants of Health (SDOH) Interventions     Readmission Risk Interventions No flowsheet data found.

## 2020-01-14 NOTE — Plan of Care (Signed)
  Problem: Education: Goal: Ability to verbalize activity precautions or restrictions will improve Outcome: Progressing   Problem: Activity: Goal: Ability to avoid complications of mobility impairment will improve Outcome: Progressing Goal: Will remain free from falls Outcome: Progressing   Problem: Clinical Measurements: Goal: Postoperative complications will be avoided or minimized Outcome: Progressing   Problem: Pain Management: Goal: Pain level will decrease Outcome: Progressing   Problem: Skin Integrity: Goal: Will show signs of wound healing Outcome: Progressing   Problem: Health Behavior/Discharge Planning: Goal: Identification of resources available to assist in meeting health care needs will improve Outcome: Progressing   Problem: Education: Goal: Knowledge of General Education information will improve Description: Including pain rating scale, medication(s)/side effects and non-pharmacologic comfort measures Outcome: Progressing   Problem: Clinical Measurements: Goal: Will remain free from infection Outcome: Progressing   Problem: Pain Managment: Goal: General experience of comfort will improve Outcome: Progressing   Problem: Safety: Goal: Ability to remain free from injury will improve Outcome: Progressing   Problem: Skin Integrity: Goal: Risk for impaired skin integrity will decrease Outcome: Progressing

## 2020-01-14 NOTE — Plan of Care (Addendum)
Pt discharging to SNF, called report to receiving facility. Discharge instructions in drawer for [facility] to deliver to facility. Instructions explained to pt and family who verbalized understanding. Packed all personal belongings. No further questions or concerns voiced. Awaiting PTAR for transportation.   Problem: Education: Goal: Ability to verbalize activity precautions or restrictions will improve 01/14/2020 1234 by Stevan Born, RN Outcome: Completed/Met 01/14/2020 1114 by Stevan Born, RN Outcome: Progressing Goal: Knowledge of the prescribed therapeutic regimen will improve Outcome: Completed/Met Goal: Understanding of discharge needs will improve Outcome: Completed/Met   Problem: Activity: Goal: Ability to avoid complications of mobility impairment will improve 01/14/2020 1234 by Stevan Born, RN Outcome: Completed/Met 01/14/2020 1114 by Stevan Born, RN Outcome: Progressing Goal: Ability to tolerate increased activity will improve Outcome: Completed/Met Goal: Will remain free from falls 01/14/2020 1234 by Stevan Born, RN Outcome: Completed/Met 01/14/2020 1114 by Stevan Born, RN Outcome: Progressing   Problem: Bowel/Gastric: Goal: Gastrointestinal status for postoperative course will improve Outcome: Completed/Met   Problem: Clinical Measurements: Goal: Ability to maintain clinical measurements within normal limits will improve Outcome: Completed/Met Goal: Postoperative complications will be avoided or minimized 01/14/2020 1234 by Stevan Born, RN Outcome: Completed/Met 01/14/2020 1114 by Stevan Born, RN Outcome: Progressing Goal: Diagnostic test results will improve Outcome: Completed/Met   Problem: Pain Management: Goal: Pain level will decrease 01/14/2020 1234 by Stevan Born, RN Outcome: Completed/Met 01/14/2020 1114 by Stevan Born, RN Outcome: Progressing   Problem: Skin Integrity: Goal: Will show signs of wound  healing 01/14/2020 1234 by Stevan Born, RN Outcome: Completed/Met 01/14/2020 1114 by Stevan Born, RN Outcome: Progressing   Problem: Health Behavior/Discharge Planning: Goal: Identification of resources available to assist in meeting health care needs will improve 01/14/2020 1234 by Deaver, Curley Spice, RN Outcome: Completed/Met 01/14/2020 1114 by Stevan Born, RN Outcome: Progressing   Problem: Bladder/Genitourinary: Goal: Urinary functional status for postoperative course will improve Outcome: Completed/Met   Problem: Education: Goal: Knowledge of General Education information will improve Description: Including pain rating scale, medication(s)/side effects and non-pharmacologic comfort measures 01/14/2020 1234 by Stevan Born, RN Outcome: Completed/Met 01/14/2020 1114 by Stevan Born, RN Outcome: Progressing   Problem: Health Behavior/Discharge Planning: Goal: Ability to manage health-related needs will improve Outcome: Completed/Met   Problem: Clinical Measurements: Goal: Ability to maintain clinical measurements within normal limits will improve Outcome: Completed/Met Goal: Will remain free from infection 01/14/2020 1234 by Stevan Born, RN Outcome: Completed/Met 01/14/2020 1114 by Stevan Born, RN Outcome: Progressing Goal: Diagnostic test results will improve Outcome: Completed/Met Goal: Respiratory complications will improve Outcome: Completed/Met Goal: Cardiovascular complication will be avoided Outcome: Completed/Met   Problem: Activity: Goal: Risk for activity intolerance will decrease Outcome: Completed/Met   Problem: Nutrition: Goal: Adequate nutrition will be maintained Outcome: Completed/Met   Problem: Coping: Goal: Level of anxiety will decrease Outcome: Completed/Met   Problem: Elimination: Goal: Will not experience complications related to bowel motility Outcome: Completed/Met Goal: Will not experience complications  related to urinary retention Outcome: Completed/Met   Problem: Pain Managment: Goal: General experience of comfort will improve 01/14/2020 1234 by Stevan Born, RN Outcome: Completed/Met 01/14/2020 1114 by Stevan Born, RN Outcome: Progressing   Problem: Safety: Goal: Ability to remain free from injury will improve 01/14/2020 1234 by Stevan Born, RN Outcome: Completed/Met 01/14/2020 1114 by Stevan Born, RN Outcome: Progressing   Problem: Skin Integrity: Goal: Risk for impaired skin integrity will decrease 01/14/2020 1234  by Stevan Born, RN Outcome: Completed/Met 01/14/2020 1114 by Stevan Born, RN Outcome: Progressing

## 2020-01-14 NOTE — Progress Notes (Signed)
Patient ID: Abbott Pao, female   DOB: 1934-12-31, 85 y.o.   MRN: [id number]  This NP visited patient at the bedside as a follow up for palliative medicine needs and emotional support.  Patient's daughter is at bedside.  Patient is alert  and her main focus continues to be  "when can I go home"   Patient has limited insight into her current medical situation and care needs.  Continued conversation with daughter/[name] regarding current medical situation, underlying co-morbidities,  specific to her continued cognitive decline and overall fraility and the impact on likely medical trajectory.   Daughter tells me current plan is SNF for rehab, likely discharge today.  Recommend outpatient community-based palliative service to follow at facility.  Family continues to put together a care plan for when patient is discharged from SNF, keeping her at home is a priority.  Emotional support to daughter at bedside, again creating space and opportunity for family to explore thoughts and feelings regarding the current situation.  Discussed with patient and her daughter the importance of continued conversation with family and the medical providers regarding overall plan of care and treatment options,  ensuring decisions are within the context of the patients values and GOCs.  Questions and concerns addressed   Discussed with Dr Alanda Slim  Total time spent on the unit was 25 minutes  Greater than 50% of the time was spent in counseling and coordination of care  [name] NP  Palliative Medicine Team Team Phone # [phone] Pager [phone]    Questions and concerns addressed    Total time spent on the unit was 20  minutes   Palliative medicine will continue to support holistically  Greater than 50% of the time was spent in counseling and coordination of care  [name] NP  Palliative Medicine Team Team Phone # [phone] Pager [phone]

## 2020-01-14 NOTE — Discharge Summary (Signed)
Physician Discharge Summary  [name]:[id number] DOB: 31-Mar-1935 DOA: 01/02/2020  PCP: Amy Plump, MD  Admit date: 01/02/2020 Discharge date: 01/14/2020  Admitted From: Home Disposition: SNF  Recommendations for Outpatient Follow-up:  1. Follow ups as below. 2. Please obtain CBC/BMP/Mag at follow up 3. Please follow up on the following pending results: None   Discharge Condition: Stable CODE STATUS: DNR/DNI   Hospital Course: 85 year old female with past medical history ofdementia, hypothyroidism and recent fall leading to compression fracture of the T12/L1 vertebral area status post kyphoplasty.  At that time, therapy recommended SNF but patient and family decided to take her home with therapy.  Soon after discharge,patient had another fall and returned to hospital.  MRI lumbar spine revealed new L2 compression fracture with 10% height loss without retropulsion.  Case discussed with neurosurgery who recommended IR evaluation. She had kyphoplasty to L2 fracture on 01/06/2020.  Pain fairly controlled with Tylenol and as needed Norco.  Started on vitamin D/calcium/calcitonin as well.  See individual problem list below for more on hospital course.  Discharge Diagnoses:  Recurrent mechanical fall Closed compression fracture of L2 vertebrae, initial encounter Close compression fracture of T12-L1 vertebra, subsequent encounter Acute on chronic lower back pain without radiculopathy -Has had kyphoplasty of L2 fracture on 2/16. -Had kyphoplasty of T12-L1 prior admission. -Lumbar film on 2/21 with no acute finding-reviewed by me and neuro IR, Dr. Quay Burow -Scheduled Tylenol with as needed Vicodin -Calcitonin nasal spray  -Continue vitamin D and calcium -Daily PT/OT at SNF.  Chronic junctional bradycardia: Chronic.  B/l HR in 30s per family and patient which seems to be right based on a EKG from 2015.  Improves with exertion.  Not on nodal blocking agent or Aricept.  TSH  within normal. Per patient and family, heart rate improves with movement and exertion.  Per daughter, patiet declined pacemaker 5 years ago.  Hypoglycemic episode: A1c 5.7%.   Dementia: Oriented to self, place and year.  Poor insight.  Lives alone. -Frequent orientation delirium precautions.  Minimize sedating medications.  Hypothyroidism: TSH within normal -Continue home Synthroid.  Discharge Instructions  Discharge Instructions    Diet general   Complete by: As directed      Allergies as of 01/14/2020      Reactions   Sulfonamide Derivatives Other (See Comments)   Skin irritation      Medication List    STOP taking these medications   cyclobenzaprine 10 MG tablet Commonly known as: FLEXERIL   docusate sodium 100 MG capsule Commonly known as: COLACE   lidocaine 5 % Commonly known as: LIDODERM   senna 8.6 MG tablet Commonly known as: SENOKOT     TAKE these medications   acetaminophen 500 MG tablet Commonly known as: TYLENOL Take 2 tablets (1,000 mg total) by mouth every 6 (six) hours as needed.   aspirin EC 81 MG tablet Take 81 mg by mouth daily.   calcitonin (salmon) 200 UNIT/ACT nasal spray Commonly known as: MIACALCIN/FORTICAL Place 1 spray into alternate nostrils daily.   diclofenac Sodium 1 % Gel Commonly known as: VOLTAREN Apply 2 g topically 4 (four) times daily.   HYDROcodone-acetaminophen 5-325 MG tablet Commonly known as: NORCO/VICODIN Take 1 tablet by mouth every 6 (six) hours as needed for up to 5 days for moderate pain or severe pain. What changed: reasons to take this   hydrocortisone 2.5 % cream Apply topically 2 (two) times daily. What changed:   how much to take  when  to take this  reasons to take this   levothyroxine 88 MCG tablet Commonly known as: SYNTHROID Take 1 tablet (88 mcg total) by mouth daily before breakfast.   multivitamin with minerals Tabs tablet Take 1 tablet by mouth daily.   Oscal 500/200 D-3 500-200  MG-UNIT tablet Generic drug: calcium-vitamin D Take 1 tablet by mouth 2 (two) times daily.   OVER THE COUNTER MEDICATION Apply 1 application topically 5 (five) times daily as needed (back pain). "Comfrey Salve"   polyethylene glycol 17 g packet Commonly known as: MIRALAX / GLYCOLAX Take 17 g by mouth daily as needed (constipation).   senna-docusate 8.6-50 MG tablet Commonly known as: Senokot-S Take 2 tablets by mouth 2 (two) times daily as needed for mild constipation or moderate constipation.   Vitamin D (Ergocalciferol) 1.25 MG (50000 UNIT) Caps capsule Commonly known as: DRISDOL Take 1 capsule (50,000 Units total) by mouth every 7 (seven) days. Start taking on: January 17, 2020       Consultations:  Neuro interventional radiology  Procedures/Studies:  2/16-kyphoplasty to L2   DG Lumbar Spine 2-3 Views  Result Date: 01/11/2020 CLINICAL DATA:  Fall, L2 compression fracture, prior vertebral augmentation EXAM: LUMBAR SPINE - 2-3 VIEW COMPARISON:  MRI lumbar spine dated 01/03/2020 FINDINGS: Normal lumbar lordosis. Prior vertebral augmentation at T12 and L1. Interval vertebral augmentation at L2. Mild (10-15%) loss of height at T12 and L2. Moderate (40%) loss of height at L1. No retropulsion. Visualized bony pelvis appears intact. IMPRESSION: Vertebral augmentation at T12-L2, with mild to moderate compression fracture deformities, as above. Electronically Signed   By: Charline BillsSriyesh  Krishnan M.D.   On: 01/11/2020 15:31   CT Lumbar Spine Wo Contrast  Result Date: 12/23/2019 CLINICAL DATA:  Low back pain. EXAM: CT LUMBAR SPINE WITHOUT CONTRAST TECHNIQUE: Multidetector CT imaging of the lumbar spine was performed without intravenous contrast administration. Multiplanar CT image reconstructions were also generated. COMPARISON:  None. FINDINGS: Segmentation: 5 lumbar type vertebrae. Alignment: Normal. Vertebrae: Acute L1 vertebral body compression fracture with approximately 60% height loss  and 6 mm of retropulsion of the superior posterior margin. Mild age-indeterminate T12 vertebral body compression fracture. No aggressive osseous lesion. Paraspinal and other soft tissues: No acute paraspinal abnormality. Abdominal aortic atherosclerosis. Mild abdominal aortic ectasia measuring 2.5 cm. Disc levels: Degenerative disc disease with disc height loss at L4-5. severe degenerative disease with disc height loss at L5-S1. Bilateral facet arthropathy at L5-S1. No significant foraminal stenosis. IMPRESSION: 1. Acute L1 vertebral body compression fracture with approximately 60% height loss and 6 mm of retropulsion of the superior posterior margin. 2. Mild age-indeterminate T12 vertebral body compression fracture. 3. 2.5 cm ectatic abdominal aorta, at risk for aneurysm development. Recommend follow-up aortic ultrasound in 5 years. This recommendation follows ACR consensus guidelines: White Paper of the ACR Incidental Findings Committee II on Vascular Findings. J Am Coll Radiol 2013; 16:109-604; 10:789-794. 4.  Aortic Atherosclerosis (ICD10-I70.0). Electronically Signed   By: Elige KoHetal  Patel   On: 12/23/2019 14:12   MR LUMBAR SPINE WO CONTRAST  Result Date: 01/03/2020 CLINICAL DATA:  Worsening back pain. Previous T12 and L1 kyphoplasty. Subsequent fall. EXAM: MRI LUMBAR SPINE WITHOUT CONTRAST TECHNIQUE: Multiplanar, multisequence MR imaging of the lumbar spine was performed. No intravenous contrast was administered. COMPARISON:  12/25/2019 FINDINGS: Segmentation:  5 lumbar type vertebral bodies. Alignment:  Mild straightening of the spine. Vertebrae: Previously augmented compression fractures at T12 and L1 do not show any additional loss of height or unexpected finding. There is a newly  seen fracture at L2 at the superior endplate with loss of height of 10%. No retropulsed. This level was normal previously. Conus medullaris and cauda equina: Conus extends to the L1 level. Conus and cauda equina appear normal. Paraspinal and  other soft tissues: Negative Disc levels: Mild posterior bowing of the posterosuperior margins of T12 and L1 but no compressive stenosis. No disc pathology from L1-2 through L3-4. L4-5 shows chronic disc degeneration with mild bulging of the disc but no compressive stenosis. L5-S1 shows disc degeneration with minimal bulging of the disc but no stenosis. IMPRESSION: Good appearance at the previously augmented levels of T12 and L1. No further collapse or unexpected finding. Newly seen superior endplate fracture at L2 with loss of height of only about 10%. No retropulsed bone. This level was normal previously. Electronically Signed   By: Paulina Fusi M.D.   On: 01/03/2020 02:51   MR LUMBAR SPINE WO CONTRAST  Result Date: 12/25/2019 CLINICAL DATA:  Lumbar compression fracture EXAM: MRI LUMBAR SPINE WITHOUT CONTRAST TECHNIQUE: Multiplanar, multisequence MR imaging of the lumbar spine was performed. No intravenous contrast was administered. COMPARISON:  CT 12/23/2019 FINDINGS: Segmentation:  Normal Alignment:  Normal Vertebrae: Mild superior endplate compression fracture of T12 with bone marrow edema. No change from the prior CT. Moderate compression fracture of L1 with approximately 50% loss of vertebral body height and diffuse bone marrow edema. Slight retropulsion of the superior endplate of L1 into the canal without significant stenosis. No change from recent CT. No other fracture or evidence of metastatic disease in the lumbar spine Conus medullaris and cauda equina: Conus extends to the L1-2 level. Conus and cauda equina appear normal. Paraspinal and other soft tissues: Negative Disc levels: T12-L1: Mild retropulsion of L1 into the spinal canal without significant spinal stenosis. L1-2: Mild disc degeneration without stenosis L2-3: Negative L3-4: Mild disc degeneration. Negative for disc protrusion or stenosis L4-5: Mild to moderate disc degeneration with disc space narrowing. Small central disc protrusion.  Negative for stenosis L5-S1: Disc degeneration with disc space narrowing. Mild endplate spurring on the left without significant foraminal stenosis IMPRESSION: Mild compression fracture T12 appears acute Moderate compression fracture L1 appears acute. Mild retropulsion of the superior endplate of L1 into the canal without significant stenosis Mild lumbar degenerative change without spinal stenosis. Electronically Signed   By: Marlan Palau M.D.   On: 12/25/2019 14:21   IR KYPHO THORACIC WITH BONE BIOPSY  Result Date: 12/29/2019 INDICATION: Severe low back pain secondary to compression fractures at T12 and L1.  EXAM: BALLOON KYPHOPLASTY AT T12 AND L1  COMPARISON:  MRI of the lumbosacral spine of December 25, 2019.  MEDICATIONS: As antibiotic prophylaxis, Ancef 2 g IV was ordered pre-procedure and administered intravenously within 1 hour of incision.  ANESTHESIA/SEDATION: Moderate (conscious) sedation was employed during this procedure. A total of Versed 2 mg and Fentanyl 75 mcg was administered intravenously.  Moderate Sedation Time: 42 minutes. The patient's level of consciousness and vital signs were monitored continuously by radiology nursing throughout the procedure under my direct supervision.  FLUOROSCOPY TIME:  Fluoroscopy Time: 15 minutes 18 seconds (469 mGy)  COMPLICATIONS: None immediate.  PROCEDURE: Following a full explanation of the procedure along with the potential associated complications, an informed witnessed consent was obtained.  The patient was placed prone on the fluoroscopic table. The skin overlying the thoracolumbar region was then prepped and draped in the usual sterile fashion. The right pedicle at T12, and the left pedicle at L1 were then infiltrated  with 0.25% bupivacaine followed by the advancement of an 11-gauge Jamshidi needle through the right pedicle at T12, and the left pedicle at L1 into the posterior one-third at these 2 levels. These were then exchanged for a Kyphon  advanced osteo introducer system comprised of a working cannula and a Kyphon osteo drill at the 2 levels.  These combinations were then advanced over a Kyphon osteo bone pin until the tips of the Kyphon osteo drills were in the posterior third at the 2 levels.  At this time, the bone pins were removed. In a medial trajectory, the combination were advanced until the tips of the working cannulae were inside the posterior one-third at T12 and L1.  Through the working cannulae, a Kyphon inflatable bone tamp 20 x 3 was advanced and positioned with the distal marker 5 mm from the anterior aspect of T12 and L1. Crossing of the midline was seen on the AP projection at T12. At this time, the balloons were expanded using contrast via a Kyphon inflation syringe device via micro tubing.  Inflations were continued until there was apposition with the superior and the inferior endplates.  At this time, methylmethacrylate mixture was reconstituted with Tobramycin in the Kyphon bone mixing device system. This was then loaded onto the Kyphon bone fillers.  The balloons were deflated and removed followed by the instillation of 3-1/2 bone filler equivalents of methylmethacrylate mixture at T12, and 3 bone filler equivalents of methylmethacrylate mixture at L1 with excellent filling in the AP and lateral projections. T12 crosses the midline where the methylmethacrylate mixture was seen.  No extravasation of the methylmethacrylate mixture was noted in the disk spaces or posteriorly into the spinal canal. No epidural venous contamination was seen.  The working cannulae and the bone fillers were then retrieved and removed. Hemostasis was achieved at the skin entry sites.  IMPRESSION: 1. Status post vertebral body augmentation using balloon kyphoplasty at T12 and L1 as described without event.   Electronically Signed   By: Julieanne CottonSanjeev  Deveshwar M.D.   On: 12/30/2019 09:31   IR KYPHO LUMBAR INC FX REDUCE BONE BX UNI/BIL  CANNULATION INC/IMAGING  Result Date: 01/07/2020 INDICATION: Severe low back pain secondary to compression fracture at L2. EXAM: BALLOON KYPHOPLASTY AT L2 COMPARISON:  MRI of the lumbosacral spine of January 03, 2020. MEDICATIONS: As antibiotic prophylaxis, Ancef 2 g IV was ordered pre-procedure and administered intravenously within 1 hour of incision. ANESTHESIA/SEDATION: Moderate (conscious) sedation was employed during this procedure. A total of Versed 1.5 mg and Fentanyl 50 mcg, and Dilaudid 1 mg IV were administered intravenously. Moderate Sedation Time: 35 minutes. The patient's level of consciousness and vital signs were monitored continuously by radiology nursing throughout the procedure under my direct supervision. FLUOROSCOPY TIME:  Fluoroscopy Time: 13 minutes 24 seconds (1079 mGy) COMPLICATIONS: None immediate. PROCEDURE: Following a full explanation of the procedure along with the potential associated complications, an informed witnessed consent was obtained. The patient was placed prone on the fluoroscopic table. The skin overlying the lumbar region was then prepped and draped in the usual sterile fashion. The both pedicles at L2 were then infiltrated with 0.25% bupivacaine, followed by the advancement of a 11 gauge Jamshidi into the posterior 1/3 at L2. These were then exchanged for a Kyphon advanced osteo introducer system comprised of a working cannula and a Kyphon osteo drill. Combinations were then advanced over a Kyphon osteo bone pin until the tip of the Kyphon osteo drill was in the posterior third at  L2. At this time, the bone pins were removed. In a medial trajectory, the combination were advanced until the tips of the working cannulae was inside the posterior one-third at L2. The osteo drills were removed. Through the working cannulae, a Kyphon inflatable bone tamp 20 x 3 was advanced and positioned with the distal marker 5 mm from the anterior aspect of L2. Crossing of the midline was  seen on the AP projection. At this time, the balloon was expanded using contrast via a Kyphon inflation syringe device via microtubing. Inflations were continued until there was apposition with the superior endplate. At this time, methylmethacrylate mixture was reconstituted with Tobramycin in the Kyphon bone mixing device system. This was then loaded onto the Kyphon bone fillers. The balloons were deflated and removed followed by the instillation of 5 bone filler equivalents of methylmethacrylate mixture with excellent filling in the AP and lateral projections. No extravasation was noted in the disk spaces or posteriorly into the spinal canal. No epidural venous contamination was seen. The working cannula and the bone filler were then retrieved and removed. Hemostasis was achieved at the skin entry sites. IMPRESSION: 1. Status post vertebral body augmentation using balloon kyphoplasty at L2 as described without event. Electronically Signed   By: Julieanne Cotton M.D.   On: 01/06/2020 14:57   IR KYPHO EA ADDL LEVEL THORACIC OR LUMBAR  Result Date: 12/31/2019 INDICATION: Severe low back pain secondary to compression fractures at T12 and L1. EXAM: BALLOON KYPHOPLASTY AT T12 AND L1 COMPARISON:  MRI of the lumbosacral spine of December 25, 2019. MEDICATIONS: As antibiotic prophylaxis, Ancef 2 g IV was ordered pre-procedure and administered intravenously within 1 hour of incision. ANESTHESIA/SEDATION: Moderate (conscious) sedation was employed during this procedure. A total of Versed 2 mg and Fentanyl 75 mcg was administered intravenously. Moderate Sedation Time: 42 minutes. The patient's level of consciousness and vital signs were monitored continuously by radiology nursing throughout the procedure under my direct supervision. FLUOROSCOPY TIME:  Fluoroscopy Time: 15 minutes 18 seconds (469 mGy) COMPLICATIONS: None immediate. PROCEDURE: Following a full explanation of the procedure along with the potential  associated complications, an informed witnessed consent was obtained. The patient was placed prone on the fluoroscopic table. The skin overlying the thoracolumbar region was then prepped and draped in the usual sterile fashion. The right pedicle at T12, and the left pedicle at L1 were then infiltrated with 0.25% bupivacaine followed by the advancement of an 11-gauge Jamshidi needle through the right pedicle at T12, and the left pedicle at L1 into the posterior one-third at these 2 levels. These were then exchanged for a Kyphon advanced osteo introducer system comprised of a working cannula and a Kyphon osteo drill at the 2 levels. These combinations were then advanced over a Kyphon osteo bone pin until the tips of the Kyphon osteo drills were in the posterior third at the 2 levels. At this time, the bone pins were removed. In a medial trajectory, the combination were advanced until the tips of the working cannulae were inside the posterior one-third at T12 and L1. Through the working cannulae, a Kyphon inflatable bone tamp 20 x 3 was advanced and positioned with the distal marker 5 mm from the anterior aspect of T12 and L1. Crossing of the midline was seen on the AP projection at T12. At this time, the balloons were expanded using contrast via a Kyphon inflation syringe device via micro tubing. Inflations were continued until there was apposition with the superior and the  inferior endplates. At this time, methylmethacrylate mixture was reconstituted with Tobramycin in the Kyphon bone mixing device system. This was then loaded onto the Kyphon bone fillers. The balloons were deflated and removed followed by the instillation of 3-1/2 bone filler equivalents of methylmethacrylate mixture at T12, and 3 bone filler equivalents of methylmethacrylate mixture at L1 with excellent filling in the AP and lateral projections. T12 crosses the midline where the methylmethacrylate mixture was seen. No extravasation of the  methylmethacrylate mixture was noted in the disk spaces or posteriorly into the spinal canal. No epidural venous contamination was seen. The working cannulae and the bone fillers were then retrieved and removed. Hemostasis was achieved at the skin entry sites. IMPRESSION: 1. Status post vertebral body augmentation using balloon kyphoplasty at T12 and L1 as described without event. Electronically Signed   By: Julieanne Cotton M.D.   On: 12/30/2019 09:31   ECHOCARDIOGRAM COMPLETE  Result Date: 01/03/2020    ECHOCARDIOGRAM REPORT   Patient Name:   MOLINA HOLLENBACK Date of Exam: 01/03/2020 Medical Rec #:  [id number]      Height:       67.0 in Accession #:    [id number]     Weight:       137.0 lb Date of Birth:  [birth year]     BSA:          1.72 m Patient Age:    84 years       BP:           147/61 mmHg Patient Gender: F              HR:           45 bpm. Exam Location:  Inpatient Procedure: 2D Echo Indications:    aortic valve disorder 424.1  History:        Patient has prior history of Echocardiogram examinations, most                 recent 01/22/2009.  Sonographer:    Delcie Roch Referring Phys: [id number] SYLVESTER I OGBATA IMPRESSIONS  1. Bradycardia noted during study ~40 bpm. Unclear rhythm, could be Wenckebach. EKG correlation recommended.  2. Left ventricular ejection fraction, by estimation, is 55 to 60%. The left ventricle has normal function. The left ventricle has no regional wall motion abnormalities. Left ventricular diastolic parameters are indeterminate.  3. Right ventricular systolic function is normal. The right ventricular size is normal. There is normal pulmonary artery systolic pressure.  4. The mitral valve is degenerative. Mild mitral valve regurgitation.  5. The aortic valve is tricuspid. Aortic valve regurgitation is not visualized. Mild aortic valve stenosis. Aortic valve area, by VTI measures 1.72 cm. Aortic valve mean gradient measures 11.5 mmHg. Aortic valve Vmax measures 2.60 m/s.  6.  There is mild (Grade II) layered plaque involving the aortic root.  7. The inferior vena cava is normal in size with greater than 50% respiratory variability, suggesting right atrial pressure of 3 mmHg. Comparison(s): No prior Echocardiogram. FINDINGS  Left Ventricle: Left ventricular ejection fraction, by estimation, is 55 to 60%. The left ventricle has normal function. The left ventricle has no regional wall motion abnormalities. There is no left ventricular hypertrophy. Left ventricular diastolic parameters are indeterminate. Right Ventricle: The right ventricular size is normal. No increase in right ventricular wall thickness. Right ventricular systolic function is normal. There is normal pulmonary artery systolic pressure. The tricuspid regurgitant velocity is 2.57 m/s, and  with an assumed right atrial  pressure of 3 mmHg, the estimated right ventricular systolic pressure is 29.4 mmHg. Left Atrium: Left atrial size was normal in size. Right Atrium: Right atrial size was normal in size. Pericardium: Trivial pericardial effusion is present. Presence of pericardial fat pad. Mitral Valve: The mitral valve is degenerative in appearance. There is moderate thickening of the mitral valve leaflet(s). There is moderate calcification of the mitral valve leaflet(s). Mild mitral valve regurgitation. Tricuspid Valve: The tricuspid valve is grossly normal. Tricuspid valve regurgitation is mild. Aortic Valve: The aortic valve is tricuspid. . There is moderate thickening and moderate calcification of the aortic valve. Aortic valve regurgitation is not visualized. Mild aortic stenosis is present. There is moderate thickening of the aortic valve. There is moderate calcification of the aortic valve. Aortic valve mean gradient measures 11.5 mmHg. Aortic valve peak gradient measures 27.0 mmHg. Aortic valve area, by VTI measures 1.72 cm. Pulmonic Valve: The pulmonic valve was grossly normal. Pulmonic valve regurgitation is mild.  Aorta: The aortic root is normal in size and structure. There is mild (Grade II) layered plaque involving the aortic root. Venous: The inferior vena cava is normal in size with greater than 50% respiratory variability, suggesting right atrial pressure of 3 mmHg. IAS/Shunts: No atrial level shunt detected by color flow Doppler. Additional Comments: Bradycardia noted during study ~40 bpm. Unclear rhythm, could be Wenckebach. EKG correlation recommended.  LEFT VENTRICLE PLAX 2D LVIDd:         4.70 cm LVIDs:         2.80 cm LV PW:         1.10 cm LV IVS:        0.90 cm LVOT diam:     1.80 cm LV SV:         83.72 ml LV SV Index:   42.50 LVOT Area:     2.54 cm  RIGHT VENTRICLE RV S prime:     12.90 cm/s TAPSE (M-mode): 2.1 cm LEFT ATRIUM             Index       RIGHT ATRIUM           Index LA diam:        3.90 cm 2.26 cm/m  RA Area:     12.90 cm LA Vol (A2C):   49.9 ml 28.98 ml/m RA Volume:   29.20 ml  16.96 ml/m LA Vol (A4C):   48.7 ml 28.28 ml/m LA Biplane Vol: 51.8 ml 30.08 ml/m  AORTIC VALVE AV Area (Vmax):    1.48 cm AV Area (Vmean):   1.65 cm AV Area (VTI):     1.72 cm AV Vmax:           260.00 cm/s AV Vmean:          149.000 cm/s AV VTI:            0.487 m AV Peak Grad:      27.0 mmHg AV Mean Grad:      11.5 mmHg LVOT Vmax:         151.00 cm/s LVOT Vmean:        96.350 cm/s LVOT VTI:          0.329 m LVOT/AV VTI ratio: 0.68  AORTA Ao Root diam: 2.80 cm MITRAL VALVE               TRICUSPID VALVE MV Area (PHT): 2.02 cm    TR Peak grad:   26.4 mmHg MV Decel Time: 375  msec    TR Vmax:        257.00 cm/s MV E velocity: 65.60 cm/s MV A velocity: 87.40 cm/s  SHUNTS MV E/A ratio:  0.75        Systemic VTI:  0.33 m                            Systemic Diam: 1.80 cm Eleonore Chiquito MD Electronically signed by Eleonore Chiquito MD Signature Date/Time: 01/03/2020/5:43:24 PM    Final    DG HIP UNILAT WITH PELVIS 2-3 VIEWS RIGHT  Result Date: 01/04/2020 CLINICAL DATA:  Fall, chronic right hip pain EXAM: DG HIP (WITH OR  WITHOUT PELVIS) 2-3V RIGHT COMPARISON:  None. FINDINGS: No fracture or dislocation is seen. Bilateral hip joint spaces are symmetric. Visualized bony pelvis appears intact. IMPRESSION: Negative. Electronically Signed   By: Julian Hy M.D.   On: 01/04/2020 01:16      Discharge Exam: Vitals:   01/14/20 0640 01/14/20 0822  BP: (!) 140/56 (!) 149/65  Pulse: (!) 40 (!) 41  Resp: 18 17  Temp: 98 F (36.7 C) 98.1 F (36.7 C)  SpO2: 96% 98%    GENERAL: No acute distress.  Appears well.  HEENT: MMM.  Vision and hearing grossly intact.  NECK: Supple.  No apparent JVD.  RESP:  No IWOB. Good air movement bilaterally. CVS: Bradycardic to 46. Heart sounds normal.  ABD/GI/GU: Bowel sounds present. Soft. Non tender.  MSK/EXT:  Moves extremities. No apparent deformity or edema.  SKIN: no apparent skin lesion or wound NEURO: Awake, alert and oriented fairly.  No apparent focal neuro deficit. PSYCH: Calm. Normal affect.   The results of significant diagnostics from this hospitalization (including imaging, microbiology, ancillary and laboratory) are listed below for reference.     Microbiology: Recent Results (from the past 240 hour(s))  SARS CORONAVIRUS 2 (TAT 6-24 HRS) Nasopharyngeal Nasopharyngeal Swab     Status: None   Collection Time: 01/12/20  7:22 AM   Specimen: Nasopharyngeal Swab  Result Value Ref Range Status   SARS Coronavirus 2 NEGATIVE NEGATIVE Final    Comment: (NOTE) SARS-CoV-2 target nucleic acids are NOT DETECTED. The SARS-CoV-2 RNA is generally detectable in upper and lower respiratory specimens during the acute phase of infection. Negative results do not preclude SARS-CoV-2 infection, do not rule out co-infections with other pathogens, and should not be used as the sole basis for treatment or other patient management decisions. Negative results must be combined with clinical observations, patient history, and epidemiological information. The expected result is  Negative. Fact Sheet for Patients: SugarRoll.be Fact Sheet for Healthcare Providers: https://www.woods-mathews.com/ This test is not yet approved or cleared by the [place] FDA and  has been authorized for detection and/or diagnosis of SARS-CoV-2 by FDA under an Emergency Use Authorization (EUA). This EUA will remain  in effect (meaning this test can be used) for the duration of the COVID-19 declaration under Section 56 4(b)(1) of the Act, 21 U.S.C. section 360bbb-3(b)(1), unless the authorization is terminated or revoked sooner. Performed at [facility], [place] [address], [place], [place] [address]      Labs: BNP (last 3 results) No results for input(s): BNP in the last 8760 hours. Basic Metabolic Panel: No results for input(s): NA, K, CL, CO2, GLUCOSE, BUN, CREATININE, CALCIUM, MG, PHOS in the last 168 hours. Liver Function Tests: No results for input(s): AST, ALT, ALKPHOS, BILITOT, PROT, ALBUMIN in the last 168  hours. No results for input(s): LIPASE, AMYLASE in the last 168 hours. No results for input(s): AMMONIA in the last 168 hours. CBC: No results for input(s): WBC, NEUTROABS, HGB, HCT, MCV, PLT in the last 168 hours. Cardiac Enzymes: No results for input(s): CKTOTAL, CKMB, CKMBINDEX, TROPONINI in the last 168 hours. BNP: Invalid input(s): POCBNP CBG: No results for input(s): GLUCAP in the last 168 hours. D-Dimer No results for input(s): DDIMER in the last 72 hours. Hgb A1c No results for input(s): HGBA1C in the last 72 hours. Lipid Profile No results for input(s): CHOL, HDL, LDLCALC, TRIG, CHOLHDL, LDLDIRECT in the last 72 hours. Thyroid function studies No results for input(s): TSH, T4TOTAL, T3FREE, THYROIDAB in the last 72 hours.  Invalid input(s): FREET3 Anemia work up No results for input(s): VITAMINB12, FOLATE, FERRITIN, TIBC, IRON, RETICCTPCT in the last 72 hours. Urinalysis    Component Value Date/Time    COLORURINE YELLOW 12/26/2019 0926   APPEARANCEUR CLEAR 12/26/2019 0926   LABSPEC 1.011 12/26/2019 0926   PHURINE 6.0 12/26/2019 0926   GLUCOSEU NEGATIVE 12/26/2019 0926   GLUCOSEU NEGATIVE 10/13/2014 1615   HGBUR NEGATIVE 12/26/2019 0926   BILIRUBINUR NEGATIVE 12/26/2019 0926   KETONESUR NEGATIVE 12/26/2019 0926   PROTEINUR NEGATIVE 12/26/2019 0926   UROBILINOGEN 0.2 10/13/2014 1615   NITRITE NEGATIVE 12/26/2019 0926   LEUKOCYTESUR SMALL (A) 12/26/2019 0926   Sepsis Labs Invalid input(s): PROCALCITONIN,  WBC,  LACTICIDVEN  Time coordinating discharge: 25 minutes  SIGNED:  Almon Hercules, MD  [facility] 01/14/2020, 11:52 AM  If 7PM-7AM, please contact night-coverage [email or web address] Password [email or web address]

## 2020-01-14 NOTE — Progress Notes (Signed)
Physical Therapy Treatment Patient Details Name: Amy Weiss MRN: [id number] DOB: 1935-08-25 Today's Date: 01/14/2020    History of Present Illness Patient is an 85 year old female with past medical history of dementia, hypothyroidism and recent fall leading to compression fracture of the T12/L1 vertebral area status post kyphoplasty.  Patient was discharged home with home health and soon after discharge, sustained a mechanical fall and had new back pain.  Patient was arranged as a direct admission and lumbar MRI noted no new findings at previous vertebroplasty area, but new fracture at L2.  Now s/p kyphoplasty on 2/16    PT Comments    Patient continues to demonstrate impulsivity with mobilizing but is able to follow cues for safety. Pt agreeable to work with PT this session but is upset about going to a SNF for rehab. She required cues for safe technique with log roll for bed mobility and was able to perform it at EOS when returning to supine. Pt maintained safe proximity to RW during transfers and gait with min guard for safety. No overt LOB noted during gait. Pt will continue to benefit from skilled PT interventions to address impairments and progress mobility.   Follow Up Recommendations  SNF     Equipment Recommendations  None recommended by PT    Recommendations for Other Services       Precautions / Restrictions Precautions Precautions: Fall;Back Precaution Booklet Issued: Yes (comment) Precaution Comments: cueing needed for log roll technique, cues for no bending duirng stand - sit to toilet Restrictions Weight Bearing Restrictions: No    Mobility  Bed Mobility Overal bed mobility: Needs Assistance Bed Mobility: Rolling;Sidelying to Sit;Sit to Sidelying Rolling: Supervision Sidelying to sit: Supervision     Sit to sidelying: Supervision General bed mobility comments: pt impulsive at start of session and sat up via long sit to pivot to EOB. verbal cues required for  pt to maintain back precautions and for sequencing log roll technique at EOS to return to supoine, no physical assist required.  Transfers Overall transfer level: Needs assistance Equipment used: Rolling walker (2 wheeled) Transfers: Sit to/from Stand Sit to Stand: Min guard         General transfer comment: min guard for safety, pt required cue sfor safe hand placement with RW for power up  Ambulation/Gait Ambulation/Gait assistance: Min guard Gait Distance (Feet): 225 Feet Assistive device: Rolling walker (2 wheeled) Gait Pattern/deviations: Step-through pattern;Decreased stride length Gait velocity: fair Gait velocity interpretation: 1.31 - 2.62 ft/sec, indicative of limited community ambulator General Gait Details: pt maintained safe hand placement during gait and safe proximity to RW, no overt LOB noted. pt required cues for safety at EOS to keep walker with her while turning to sit on EOB.   Stairs             Wheelchair Mobility    Modified Rankin (Stroke Patients Only)       Balance Overall balance assessment: Needs assistance Sitting-balance support: Feet supported;No upper extremity supported Sitting balance-Leahy Scale: Good     Standing balance support: Bilateral upper extremity supported;During functional activity Standing balance-Leahy Scale: Poor Standing balance comment: RW for ambulation           Cognition Arousal/Alertness: Awake/alert Behavior During Therapy: WFL for tasks assessed/performed Overall Cognitive Status: History of cognitive impairments - at baseline Area of Impairment: Memory;Safety/judgement      Memory: Decreased short-term memory;Decreased recall of precautions   Safety/Judgement: Decreased awareness of deficits;Decreased awareness of safety  General Comments: Mildly agitated---baseline; pt is upset about going to SNF for rehab and is angry with her daughters.      Exercises      General Comments         Pertinent Vitals/Pain Pain Assessment: Faces Faces Pain Scale: Hurts little more Pain Location: back Pain Descriptors / Indicators: Discomfort;Sore;Grimacing Pain Intervention(s): Limited activity within patient's tolerance;Monitored during session;Repositioned           PT Goals (current goals can now be found in the care plan section) Acute Rehab PT Goals Patient Stated Goal: home PT Goal Formulation: With patient/family Time For Goal Achievement: 01/21/20 Potential to Achieve Goals: Good Progress towards PT goals: Progressing toward goals    Frequency    Min 3X/week      PT Plan Current plan remains appropriate       AM-PAC PT "6 Clicks" Mobility   Outcome Measure  Help needed turning from your back to your side while in a flat bed without using bedrails?: None Help needed moving from lying on your back to sitting on the side of a flat bed without using bedrails?: None Help needed moving to and from a bed to a chair (including a wheelchair)?: A Little Help needed standing up from a chair using your arms (e.g., wheelchair or bedside chair)?: A Little Help needed to walk in hospital room?: A Little Help needed climbing 3-5 steps with a railing? : A Little 6 Click Score: 20    End of Session Equipment Utilized During Treatment: Gait belt Activity Tolerance: Patient tolerated treatment well Patient left: in bed;with call bell/phone within reach;with family/visitor present Nurse Communication: Mobility status PT Visit Diagnosis: Other abnormalities of gait and mobility (R26.89);Pain Pain - part of body: (back)     Time: 3729-0211 PT Time Calculation (min) (ACUTE ONLY): 17 min  Charges:  $Gait Training: 8-22 mins                     Wynn Maudlin, DPT Physical Therapist with [facility] [phone]  01/14/2020 1:40 PM

## 2020-01-17 DIAGNOSIS — E039 Hypothyroidism, unspecified: Secondary | ICD-10-CM | POA: Diagnosis not present

## 2020-01-17 DIAGNOSIS — S32020D Wedge compression fracture of second lumbar vertebra, subsequent encounter for fracture with routine healing: Secondary | ICD-10-CM | POA: Diagnosis not present

## 2020-01-17 DIAGNOSIS — W19XXXD Unspecified fall, subsequent encounter: Secondary | ICD-10-CM | POA: Diagnosis not present

## 2020-01-17 DIAGNOSIS — R69 Illness, unspecified: Secondary | ICD-10-CM | POA: Diagnosis not present

## 2020-01-17 DIAGNOSIS — K5900 Constipation, unspecified: Secondary | ICD-10-CM | POA: Diagnosis not present

## 2020-01-17 DIAGNOSIS — Z8679 Personal history of other diseases of the circulatory system: Secondary | ICD-10-CM | POA: Diagnosis not present

## 2020-01-23 DIAGNOSIS — R319 Hematuria, unspecified: Secondary | ICD-10-CM | POA: Diagnosis not present

## 2020-01-23 DIAGNOSIS — N390 Urinary tract infection, site not specified: Secondary | ICD-10-CM | POA: Diagnosis not present

## 2020-01-26 ENCOUNTER — Telehealth: Payer: Self-pay | Admitting: *Deleted

## 2020-01-26 DIAGNOSIS — K5900 Constipation, unspecified: Secondary | ICD-10-CM | POA: Diagnosis not present

## 2020-01-26 DIAGNOSIS — E039 Hypothyroidism, unspecified: Secondary | ICD-10-CM | POA: Diagnosis not present

## 2020-01-26 DIAGNOSIS — Z8679 Personal history of other diseases of the circulatory system: Secondary | ICD-10-CM | POA: Diagnosis not present

## 2020-01-26 DIAGNOSIS — S32020D Wedge compression fracture of second lumbar vertebra, subsequent encounter for fracture with routine healing: Secondary | ICD-10-CM | POA: Diagnosis not present

## 2020-01-26 DIAGNOSIS — R69 Illness, unspecified: Secondary | ICD-10-CM | POA: Diagnosis not present

## 2020-01-26 NOTE — Telephone Encounter (Signed)
Patient Name Amy Weiss Patient DOB November 09, 1935 Call Type Message Only Information Provided Reason for Call Request to Speak to a Physician Initial Comment Caller states she is in the hospital and needs an order to get out. She states she is admitted at [facility] [address] She is being released on Tuesday as long as the order is sent in. She states she is going home Tuesday and regardless Additional Comment Provided office hours and advised to call back. Caller was very upset and said she was leaving Tuesday regardless

## 2020-01-26 NOTE — Telephone Encounter (Signed)
She is under the care of hospital team.

## 2020-01-27 ENCOUNTER — Emergency Department (HOSPITAL_COMMUNITY)
Admission: EM | Admit: 2020-01-27 | Discharge: 2020-01-27 | Disposition: A | Discharge status: Home / Self Care | Payer: Medicare HMO | Source: Home / Self Care

## 2020-01-27 ENCOUNTER — Telehealth: Payer: Self-pay | Admitting: Internal Medicine

## 2020-01-27 ENCOUNTER — Encounter (HOSPITAL_COMMUNITY): Payer: Self-pay | Admitting: Emergency Medicine

## 2020-01-27 ENCOUNTER — Other Ambulatory Visit: Payer: Self-pay

## 2020-01-27 DIAGNOSIS — M4854XA Collapsed vertebra, not elsewhere classified, thoracic region, initial encounter for fracture: Secondary | ICD-10-CM | POA: Diagnosis not present

## 2020-01-27 DIAGNOSIS — M549 Dorsalgia, unspecified: Secondary | ICD-10-CM | POA: Diagnosis not present

## 2020-01-27 DIAGNOSIS — S32059A Unspecified fracture of fifth lumbar vertebra, initial encounter for closed fracture: Secondary | ICD-10-CM | POA: Diagnosis not present

## 2020-01-27 DIAGNOSIS — R319 Hematuria, unspecified: Secondary | ICD-10-CM | POA: Diagnosis not present

## 2020-01-27 DIAGNOSIS — X58XXXA Exposure to other specified factors, initial encounter: Secondary | ICD-10-CM | POA: Diagnosis not present

## 2020-01-27 DIAGNOSIS — G8929 Other chronic pain: Secondary | ICD-10-CM | POA: Diagnosis not present

## 2020-01-27 DIAGNOSIS — Z9181 History of falling: Secondary | ICD-10-CM | POA: Diagnosis not present

## 2020-01-27 DIAGNOSIS — M47816 Spondylosis without myelopathy or radiculopathy, lumbar region: Secondary | ICD-10-CM | POA: Diagnosis not present

## 2020-01-27 DIAGNOSIS — N390 Urinary tract infection, site not specified: Secondary | ICD-10-CM | POA: Diagnosis not present

## 2020-01-27 DIAGNOSIS — Z79891 Long term (current) use of opiate analgesic: Secondary | ICD-10-CM | POA: Diagnosis not present

## 2020-01-27 DIAGNOSIS — M4856XA Collapsed vertebra, not elsewhere classified, lumbar region, initial encounter for fracture: Secondary | ICD-10-CM | POA: Diagnosis not present

## 2020-01-27 DIAGNOSIS — G8911 Acute pain due to trauma: Secondary | ICD-10-CM | POA: Diagnosis not present

## 2020-01-27 DIAGNOSIS — R6889 Other general symptoms and signs: Secondary | ICD-10-CM | POA: Diagnosis not present

## 2020-01-27 LAB — CBC
HCT: 42.2 % (ref 36.0–46.0)
Hemoglobin: 13.8 g/dL (ref 12.0–15.0)
MCH: 30.1 pg (ref 26.0–34.0)
MCHC: 32.7 g/dL (ref 30.0–36.0)
MCV: 92.1 fL (ref 80.0–100.0)
Platelets: 284 10*3/uL (ref 150–400)
RBC: 4.58 MIL/uL (ref 3.87–5.11)
RDW: 15.6 % — ABNORMAL HIGH (ref 11.5–15.5)
WBC: 8.9 10*3/uL (ref 4.0–10.5)
nRBC: 0 % (ref 0.0–0.2)

## 2020-01-27 LAB — BASIC METABOLIC PANEL
Anion gap: 11 (ref 5–15)
BUN: 18 mg/dL (ref 8–23)
CO2: 23 mmol/L (ref 22–32)
Calcium: 9.1 mg/dL (ref 8.9–10.3)
Chloride: 109 mmol/L (ref 98–111)
Creatinine, Ser: 0.67 mg/dL (ref 0.44–1.00)
GFR calc Af Amer: >60 mL/min (ref >60)
GFR calc non Af Amer: >60 mL/min (ref >60)
Glucose, Bld: 106 mg/dL — ABNORMAL HIGH (ref 70–99)
Potassium: 3.6 mmol/L (ref 3.5–5.1)
Sodium: 143 mmol/L (ref 135–145)

## 2020-01-27 NOTE — Telephone Encounter (Signed)
[name] has called three times today requesting call back -informed that message is w/ PCP whom is in clinic- will receive call back/advice once PCP has answered.

## 2020-01-27 NOTE — ED Triage Notes (Signed)
Pt arrives via EMS from [facility] with reports of back pain. Hx of kyphoplasty but has dementia and forgets her back restrictions.

## 2020-01-27 NOTE — Telephone Encounter (Signed)
Patient daughter called back to states that it was urgent that she speaks with Dr Drue Novel in regards to her mother Amy Weiss. [name] is currently in a Rehab and has injury her back until to move per daughter  [name] and they are d/c her today. [name] is requesting that Dr Drue Novel, [phone].

## 2020-01-27 NOTE — Telephone Encounter (Signed)
[name] is calling stated her mother is in a rehab facility. She states her mother was suppose to be discharged today. But she called her daughter and stated she has hurt her back and cant get out of bed. [name] is requesting for her mother to have an xray and possible to direct admit her so she can have proper treatment. Patient is requesting for Amy Weiss to cal her back to advise [phone]
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
## 2020-01-28 ENCOUNTER — Telehealth: Payer: Self-pay | Admitting: Internal Medicine

## 2020-01-28 ENCOUNTER — Inpatient Hospital Stay (HOSPITAL_COMMUNITY)
Admission: EM | Admit: 2020-01-28 | Discharge: 2020-02-03 | DRG: 516 | Disposition: A | Discharge status: Home Health | Payer: Medicare HMO | Attending: Family Medicine | Admitting: Family Medicine

## 2020-01-28 ENCOUNTER — Emergency Department (HOSPITAL_COMMUNITY): Payer: Medicare HMO

## 2020-01-28 ENCOUNTER — Other Ambulatory Visit: Payer: Self-pay

## 2020-01-28 DIAGNOSIS — E039 Hypothyroidism, unspecified: Secondary | ICD-10-CM | POA: Diagnosis present

## 2020-01-28 DIAGNOSIS — S22080D Wedge compression fracture of T11-T12 vertebra, subsequent encounter for fracture with routine healing: Secondary | ICD-10-CM | POA: Diagnosis not present

## 2020-01-28 DIAGNOSIS — Z6821 Body mass index (BMI) 21.0-21.9, adult: Secondary | ICD-10-CM | POA: Diagnosis not present

## 2020-01-28 DIAGNOSIS — I4821 Permanent atrial fibrillation: Secondary | ICD-10-CM | POA: Diagnosis present

## 2020-01-28 DIAGNOSIS — K828 Other specified diseases of gallbladder: Secondary | ICD-10-CM | POA: Diagnosis not present

## 2020-01-28 DIAGNOSIS — M4856XA Collapsed vertebra, not elsewhere classified, lumbar region, initial encounter for fracture: Secondary | ICD-10-CM | POA: Diagnosis not present

## 2020-01-28 DIAGNOSIS — E440 Moderate protein-calorie malnutrition: Secondary | ICD-10-CM | POA: Diagnosis not present

## 2020-01-28 DIAGNOSIS — E785 Hyperlipidemia, unspecified: Secondary | ICD-10-CM | POA: Diagnosis not present

## 2020-01-28 DIAGNOSIS — R7989 Other specified abnormal findings of blood chemistry: Secondary | ICD-10-CM | POA: Diagnosis present

## 2020-01-28 DIAGNOSIS — S22000A Wedge compression fracture of unspecified thoracic vertebra, initial encounter for closed fracture: Secondary | ICD-10-CM

## 2020-01-28 DIAGNOSIS — S32059A Unspecified fracture of fifth lumbar vertebra, initial encounter for closed fracture: Secondary | ICD-10-CM | POA: Diagnosis not present

## 2020-01-28 DIAGNOSIS — R7401 Elevation of levels of liver transaminase levels: Secondary | ICD-10-CM

## 2020-01-28 DIAGNOSIS — I10 Essential (primary) hypertension: Secondary | ICD-10-CM | POA: Diagnosis present

## 2020-01-28 DIAGNOSIS — R627 Adult failure to thrive: Secondary | ICD-10-CM | POA: Diagnosis present

## 2020-01-28 DIAGNOSIS — D649 Anemia, unspecified: Secondary | ICD-10-CM | POA: Diagnosis not present

## 2020-01-28 DIAGNOSIS — S32020D Wedge compression fracture of second lumbar vertebra, subsequent encounter for fracture with routine healing: Secondary | ICD-10-CM | POA: Diagnosis not present

## 2020-01-28 DIAGNOSIS — G250 Essential tremor: Secondary | ICD-10-CM | POA: Diagnosis present

## 2020-01-28 DIAGNOSIS — Z8673 Personal history of transient ischemic attack (TIA), and cerebral infarction without residual deficits: Secondary | ICD-10-CM | POA: Diagnosis not present

## 2020-01-28 DIAGNOSIS — S32020A Wedge compression fracture of second lumbar vertebra, initial encounter for closed fracture: Secondary | ICD-10-CM | POA: Diagnosis not present

## 2020-01-28 DIAGNOSIS — I441 Atrioventricular block, second degree: Secondary | ICD-10-CM | POA: Diagnosis not present

## 2020-01-28 DIAGNOSIS — Z800 Family history of malignant neoplasm of digestive organs: Secondary | ICD-10-CM | POA: Diagnosis not present

## 2020-01-28 DIAGNOSIS — Z87891 Personal history of nicotine dependence: Secondary | ICD-10-CM | POA: Diagnosis not present

## 2020-01-28 DIAGNOSIS — M4854XA Collapsed vertebra, not elsewhere classified, thoracic region, initial encounter for fracture: Secondary | ICD-10-CM | POA: Diagnosis not present

## 2020-01-28 DIAGNOSIS — E119 Type 2 diabetes mellitus without complications: Secondary | ICD-10-CM | POA: Diagnosis present

## 2020-01-28 DIAGNOSIS — Z20822 Contact with and (suspected) exposure to covid-19: Secondary | ICD-10-CM | POA: Diagnosis present

## 2020-01-28 DIAGNOSIS — Z8249 Family history of ischemic heart disease and other diseases of the circulatory system: Secondary | ICD-10-CM

## 2020-01-28 DIAGNOSIS — R945 Abnormal results of liver function studies: Secondary | ICD-10-CM | POA: Diagnosis not present

## 2020-01-28 DIAGNOSIS — S22070A Wedge compression fracture of T9-T10 vertebra, initial encounter for closed fracture: Secondary | ICD-10-CM | POA: Diagnosis not present

## 2020-01-28 DIAGNOSIS — R634 Abnormal weight loss: Secondary | ICD-10-CM | POA: Diagnosis present

## 2020-01-28 DIAGNOSIS — R001 Bradycardia, unspecified: Secondary | ICD-10-CM | POA: Diagnosis present

## 2020-01-28 DIAGNOSIS — M8008XA Age-related osteoporosis with current pathological fracture, vertebra(e), initial encounter for fracture: Secondary | ICD-10-CM | POA: Diagnosis not present

## 2020-01-28 DIAGNOSIS — M47816 Spondylosis without myelopathy or radiculopathy, lumbar region: Secondary | ICD-10-CM | POA: Diagnosis not present

## 2020-01-28 DIAGNOSIS — Z66 Do not resuscitate: Secondary | ICD-10-CM | POA: Diagnosis not present

## 2020-01-28 DIAGNOSIS — S22070D Wedge compression fracture of T9-T10 vertebra, subsequent encounter for fracture with routine healing: Secondary | ICD-10-CM | POA: Diagnosis not present

## 2020-01-28 DIAGNOSIS — S22070G Wedge compression fracture of T9-T10 vertebra, subsequent encounter for fracture with delayed healing: Secondary | ICD-10-CM | POA: Diagnosis not present

## 2020-01-28 DIAGNOSIS — S22080A Wedge compression fracture of T11-T12 vertebra, initial encounter for closed fracture: Secondary | ICD-10-CM | POA: Diagnosis not present

## 2020-01-28 DIAGNOSIS — K5730 Diverticulosis of large intestine without perforation or abscess without bleeding: Secondary | ICD-10-CM | POA: Diagnosis not present

## 2020-01-28 DIAGNOSIS — F0390 Unspecified dementia without behavioral disturbance: Secondary | ICD-10-CM | POA: Diagnosis present

## 2020-01-28 DIAGNOSIS — G8911 Acute pain due to trauma: Secondary | ICD-10-CM | POA: Diagnosis not present

## 2020-01-28 LAB — CBC WITH DIFFERENTIAL/PLATELET
Abs Immature Granulocytes: 0.06 10*3/uL (ref 0.00–0.07)
Basophils Absolute: 0.1 10*3/uL (ref 0.0–0.1)
Basophils Relative: 1 %
Eosinophils Absolute: 0.3 10*3/uL (ref 0.0–0.5)
Eosinophils Relative: 4 %
HCT: 42.4 % (ref 36.0–46.0)
Hemoglobin: 13.5 g/dL (ref 12.0–15.0)
Immature Granulocytes: 1 %
Lymphocytes Relative: 13 %
Lymphs Abs: 1.2 10*3/uL (ref 0.7–4.0)
MCH: 30.3 pg (ref 26.0–34.0)
MCHC: 31.8 g/dL (ref 30.0–36.0)
MCV: 95.3 fL (ref 80.0–100.0)
Monocytes Absolute: 1.1 10*3/uL — ABNORMAL HIGH (ref 0.1–1.0)
Monocytes Relative: 12 %
Neutro Abs: 6.4 10*3/uL (ref 1.7–7.7)
Neutrophils Relative %: 69 %
Platelets: 220 10*3/uL (ref 150–400)
RBC: 4.45 MIL/uL (ref 3.87–5.11)
RDW: 15.9 % — ABNORMAL HIGH (ref 11.5–15.5)
WBC: 9.1 10*3/uL (ref 4.0–10.5)
nRBC: 0 % (ref 0.0–0.2)

## 2020-01-28 LAB — SARS CORONAVIRUS 2 (TAT 6-24 HRS): SARS Coronavirus 2: NEGATIVE

## 2020-01-28 LAB — URINALYSIS, ROUTINE W REFLEX MICROSCOPIC
Bacteria, UA: NONE SEEN
Bilirubin Urine: NEGATIVE
Glucose, UA: NEGATIVE mg/dL
Hgb urine dipstick: NEGATIVE
Ketones, ur: 20 mg/dL — AB
Nitrite: NEGATIVE
Protein, ur: 30 mg/dL — AB
Specific Gravity, Urine: 1.021 (ref 1.005–1.030)
pH: 5 (ref 5.0–8.0)

## 2020-01-28 LAB — COMPREHENSIVE METABOLIC PANEL
ALT: 151 U/L — ABNORMAL HIGH (ref 0–44)
AST: 181 U/L — ABNORMAL HIGH (ref 15–41)
Albumin: 2.9 g/dL — ABNORMAL LOW (ref 3.5–5.0)
Alkaline Phosphatase: 374 U/L — ABNORMAL HIGH (ref 38–126)
Anion gap: 8 (ref 5–15)
BUN: 18 mg/dL (ref 8–23)
CO2: 23 mmol/L (ref 22–32)
Calcium: 8.6 mg/dL — ABNORMAL LOW (ref 8.9–10.3)
Chloride: 108 mmol/L (ref 98–111)
Creatinine, Ser: 0.57 mg/dL (ref 0.44–1.00)
GFR calc Af Amer: >60 mL/min (ref >60)
GFR calc non Af Amer: >60 mL/min (ref >60)
Glucose, Bld: 99 mg/dL (ref 70–99)
Potassium: 3.3 mmol/L — ABNORMAL LOW (ref 3.5–5.1)
Sodium: 139 mmol/L (ref 135–145)
Total Bilirubin: 2.9 mg/dL — ABNORMAL HIGH (ref 0.3–1.2)
Total Protein: 5.9 g/dL — ABNORMAL LOW (ref 6.5–8.1)

## 2020-01-28 LAB — HEPATITIS PANEL, ACUTE
HCV Ab: NONREACTIVE
Hep A IgM: NONREACTIVE
Hep B C IgM: NONREACTIVE
Hepatitis B Surface Ag: NONREACTIVE

## 2020-01-28 LAB — LIPASE, BLOOD: Lipase: 14 U/L (ref 11–51)

## 2020-01-28 LAB — ACETAMINOPHEN LEVEL: Acetaminophen (Tylenol), Serum: <10 ug/mL — ABNORMAL LOW (ref 10–30)

## 2020-01-28 MED ORDER — IOHEXOL 300 MG/ML  SOLN
75.0000 mL | Freq: Once | INTRAMUSCULAR | Status: AC | PRN
  Administered 2020-01-28: 75 mL via INTRAVENOUS

## 2020-01-28 MED ORDER — DICLOFENAC SODIUM 1 % EX GEL
2.0000 g | Freq: Four times a day (QID) | CUTANEOUS | Status: DC
  Administered 2020-01-28 – 2020-02-03 (×20): 2 g via TOPICAL
  Filled 2020-01-28 (×2): qty 100

## 2020-01-28 MED ORDER — SODIUM CHLORIDE 0.9 % IV BOLUS (SEPSIS)
1000.0000 mL | Freq: Once | INTRAVENOUS | Status: AC
  Administered 2020-01-28: 1000 mL via INTRAVENOUS

## 2020-01-28 MED ORDER — LORAZEPAM 2 MG/ML IJ SOLN
0.5000 mg | Freq: Once | INTRAMUSCULAR | Status: AC
  Administered 2020-01-28: 0.5 mg via INTRAVENOUS
  Filled 2020-01-28: qty 1

## 2020-01-28 MED ORDER — POLYETHYLENE GLYCOL 3350 17 G PO PACK: 17.0000 g | PACK | Freq: Every day | ORAL | Status: DC | PRN

## 2020-01-28 MED ORDER — SODIUM CHLORIDE 0.9% FLUSH
3.0000 mL | Freq: Two times a day (BID) | INTRAVENOUS | Status: DC
  Administered 2020-01-28 – 2020-02-03 (×11): 3 mL via INTRAVENOUS

## 2020-01-28 MED ORDER — LEVOTHYROXINE SODIUM 88 MCG PO TABS
88.0000 ug | ORAL_TABLET | Freq: Every day | ORAL | Status: DC
  Administered 2020-01-30 – 2020-02-03 (×5): 88 ug via ORAL
  Filled 2020-01-28 (×5): qty 1

## 2020-01-28 MED ORDER — HALOPERIDOL LACTATE 5 MG/ML IJ SOLN: 2.0000 mg | Freq: Four times a day (QID) | INTRAMUSCULAR | Status: DC | PRN

## 2020-01-28 MED ORDER — ACETAMINOPHEN 500 MG PO TABS
1000.00 | ORAL_TABLET | ORAL | Status: DC
Start: ? — End: 2020-01-28

## 2020-01-28 MED ORDER — KETOROLAC TROMETHAMINE 15 MG/ML IJ SOLN
15.0000 mg | Freq: Four times a day (QID) | INTRAMUSCULAR | Status: AC | PRN
  Administered 2020-01-28 – 2020-02-01 (×3): 15 mg via INTRAVENOUS
  Filled 2020-01-28 (×3): qty 1

## 2020-01-28 MED ORDER — LEVOTHYROXINE SODIUM 88 MCG PO TABS
88.00 | ORAL_TABLET | ORAL | Status: DC
Start: ? — End: 2020-01-28

## 2020-01-28 MED ORDER — SODIUM CHLORIDE 0.9% FLUSH: 3.0000 mL | INTRAVENOUS | Status: DC | PRN

## 2020-01-28 MED ORDER — SENNOSIDES-DOCUSATE SODIUM 8.6-50 MG PO TABS
2.0000 | ORAL_TABLET | Freq: Two times a day (BID) | ORAL | Status: DC | PRN
  Administered 2020-01-31: 2 via ORAL
  Filled 2020-01-28: qty 2

## 2020-01-28 MED ORDER — VITAMIN D (ERGOCALCIFEROL) 1.25 MG (50000 UNIT) PO CAPS
50000.0000 [IU] | ORAL_CAPSULE | ORAL | Status: DC
  Administered 2020-01-31: 50000 [IU] via ORAL
  Filled 2020-01-28: qty 1

## 2020-01-28 MED ORDER — CALCITONIN (SALMON) 200 UNIT/ACT NA SOLN
1.0000 | Freq: Every day | NASAL | Status: DC
  Administered 2020-01-29 – 2020-02-03 (×6): 1 via NASAL
  Filled 2020-01-28: qty 3.7

## 2020-01-28 MED ORDER — ACETAMINOPHEN 500 MG PO TABS
1000.0000 mg | ORAL_TABLET | Freq: Three times a day (TID) | ORAL | Status: DC | PRN
  Administered 2020-01-31: 1000 mg via ORAL
  Filled 2020-01-28: qty 2

## 2020-01-28 MED ORDER — HYDROCODONE-ACETAMINOPHEN 5-325 MG PO TABS
1.00 | ORAL_TABLET | ORAL | Status: DC
Start: ? — End: 2020-01-28

## 2020-01-28 MED ORDER — ENOXAPARIN SODIUM 40 MG/0.4ML ~~LOC~~ SOLN
40.00 | SUBCUTANEOUS | Status: DC
Start: ? — End: 2020-01-28

## 2020-01-28 MED ORDER — AMLODIPINE BESYLATE 5 MG PO TABS: 2.5000 mg | ORAL_TABLET | Freq: Every day | ORAL | Status: DC

## 2020-01-28 MED ORDER — CALCIUM CARBONATE-VITAMIN D 500-200 MG-UNIT PO TABS
1.0000 | ORAL_TABLET | Freq: Two times a day (BID) | ORAL | Status: DC
  Administered 2020-01-28 – 2020-02-03 (×13): 1 via ORAL
  Filled 2020-01-28 (×14): qty 1

## 2020-01-28 MED ORDER — SODIUM CHLORIDE 0.9 % IV SOLN
250.0000 mL | INTRAVENOUS | Status: DC | PRN
  Administered 2020-01-30: 250 mL via INTRAVENOUS

## 2020-01-28 MED ORDER — HYDROMORPHONE HCL 1 MG/ML IJ SOLN
0.50 | INTRAMUSCULAR | Status: DC
Start: ? — End: 2020-01-28

## 2020-01-28 MED ORDER — LORAZEPAM 2 MG/ML IJ SOLN
0.5000 mg | INTRAMUSCULAR | Status: DC | PRN
  Administered 2020-01-30 – 2020-02-02 (×3): 0.5 mg via INTRAVENOUS
  Filled 2020-01-28 (×3): qty 1

## 2020-01-28 MED ORDER — CYCLOBENZAPRINE HCL 10 MG PO TABS
10.00 | ORAL_TABLET | ORAL | Status: DC
Start: ? — End: 2020-01-28

## 2020-01-28 MED ORDER — HEPARIN SODIUM (PORCINE) 5000 UNIT/ML IJ SOLN
5000.0000 [IU] | Freq: Three times a day (TID) | INTRAMUSCULAR | Status: AC
  Administered 2020-01-28 (×2): 5000 [IU] via SUBCUTANEOUS
  Filled 2020-01-28 (×2): qty 1

## 2020-01-28 MED ORDER — POLYETHYLENE GLYCOL 3350 17 GM/SCOOP PO POWD
17.00 | ORAL | Status: DC
Start: ? — End: 2020-01-28

## 2020-01-28 MED ORDER — HYDROMORPHONE HCL 1 MG/ML IJ SOLN
0.5000 mg | Freq: Once | INTRAMUSCULAR | Status: AC
  Administered 2020-01-28: 0.5 mg via INTRAVENOUS
  Filled 2020-01-28: qty 1

## 2020-01-28 NOTE — ED Notes (Signed)
Report attempted 

## 2020-01-28 NOTE — Progress Notes (Addendum)
9am: CSW received return call from [place] at [facility] who states this patient was discharged on the last day of her insurance authorization, which was yesterday. Inetta Fermo reports new authorization would need to be obtained if the patient needs additional time at SNF.  8am: CSW received consult for patient for SNF placement. Patient was recently [facility] in [place]. CSW attempted to reach admissions at [facility] to confirm she can return if needed, no answer so a voicemail was left requesting a return call.  Edwin Dada, MSW, LCSW-A Transitions of Care  Clinical Social Worker  [facility] Emergency Departments  Medical ICU [phone]

## 2020-01-28 NOTE — H&P (Signed)
History and Physical    Amy Weiss:[id number] DOB: Dec 19, 1934 DOA: 01/28/2020  PCP: Colon Branch, MD (Confirm with patient/family/NH records and if not entered, this has to be entered at [facility] point of entry) Patient coming from: Skilled rehab  I have personally briefly reviewed patient's old medical records in [place]  Chief Complaint: back pain  HPI: Amy Weiss is a 85 y.o. female with medical history significant of cognitive impairment, bradycardia who has had multiple compression fractures over the past two months: T12/L1 with kyphoplasty 01/06/20, L2 with kyphoplasty 01/11/20. She has had adult FTT and was seen by the palliative care service 01/08/20 and 01/12/20 and has  DNR and MOST in place. Patient had recurrent severe back pain and was seen at [facility] 01/27/20. By plain xray was dx'd with L5 compression fracture but left with family AMA. She presents today to [facility] for continued pain.   (For level 3, the HPI must include 4+ descriptors: Location, Quality, Severity, Duration, Timing, Context, modifying factors, associated signs/symptoms and/or status of 3+ chronic problems.)  (Please avoid self-populating past medical history here) (The initial 2-3 lines should be focused and good to copy and paste in the HPI section of the daily progress note).  ED Course: In ED she is hemodynamically stable. Eval reveals T10 compression fracture confirmed by MRI spine. She was referred to [facility] for admission for pain management and will consult with Dr. Estanislado Pandy for kyphoplasty.  Review of Systems: As per HPI otherwise 10 point review of systems negative.  Unacceptable ROS statements: "10 systems reviewed," "Extensive" (without elaboration).  Acceptable ROS statements: "All others negative," "All others reviewed and are negative," and "All others unremarkable," with at [facility] documented Can't double dip - if using for HPI can't use for ROS  Past Medical History:   Diagnosis Date  . Diabetes mellitus 4/09   A1C-6  . Familial tremor   . Hyperlipidemia   . Hypothyroidism   . Osteoporosis   . Tachycardia    AV Re-entry, s/p ablation aprox 2005  . TIA (transient ischemic attack) 12/09    Past Surgical History:  Procedure Laterality Date  . ABDOMINAL HYSTERECTOMY     no oophorectomy  . BLADDER SURGERY     x 2 in the 80s  . BREAST BIOPSY     L (-)  . IR KYPHO EA ADDL LEVEL THORACIC OR LUMBAR  12/29/2019  . IR KYPHO LUMBAR INC FX REDUCE BONE BX UNI/BIL CANNULATION INC/IMAGING  01/06/2020  . IR KYPHO THORACIC WITH BONE BIOPSY  12/29/2019   Soc Hx - was married and had two daughters, (other children?) She worked as a Marine scientist in long-term care. She per her daughter was a free spirit and adventuresome woman who took life on her own terms. Her Significant other died in the last year. She has not wanted heroic interventions, I.e.pacemaker or other complex care. Family cannot care for her at home.   reports that she quit smoking about 7 years ago. She has never used smokeless tobacco. She reports current alcohol use. She reports that she does not use drugs.  Allergies  Allergen Reactions  . Sulfonamide Derivatives Other (See Comments)    Skin irritation    Family History  Problem Relation Age of Onset  . Pancreatic cancer Brother   . Pancreatic cancer Father   . Coronary artery disease Mother 73  . Breast cancer Neg Hx   . Colon cancer Neg Hx  Unacceptable: Noncontributory, unremarkable, or negative. Acceptable: Family history reviewed and not pertinent (If you reviewed it)  Prior to Admission medications   Medication Sig Start Date End Date Taking? Authorizing Provider  acetaminophen (TYLENOL) 500 MG tablet Take 2 tablets (1,000 mg total) by mouth every 6 (six) hours as needed. 01/12/20 01/11/21 Yes Mercy Riding, MD  amLODipine (NORVASC) 2.5 MG tablet Take 2.5 mg by mouth daily.   Yes [provider]  aspirin EC 81 MG tablet Take 81 mg  by mouth daily.   Yes [provider]  calcitonin, salmon, (MIACALCIN/FORTICAL) 200 UNIT/ACT nasal spray Place 1 spray into alternate nostrils daily. 01/13/20  Yes Mercy Riding, MD  calcium-vitamin D (OSCAL 500/200 D-3) 500-200 MG-UNIT tablet Take 1 tablet by mouth 2 (two) times daily. 01/12/20 01/11/21 Yes Mercy Riding, MD  diclofenac Sodium (VOLTAREN) 1 % GEL Apply 2 g topically 4 (four) times daily. 01/12/20  Yes Mercy Riding, MD  HYDROcodone-acetaminophen (NORCO/VICODIN) 5-325 MG tablet Take 1 tablet by mouth every 4 (four) hours as needed for moderate pain.   Yes [provider]  hydrocortisone 2.5 % cream Apply topically 2 (two) times daily. Patient taking differently: Apply 1 application topically 2 (two) times daily as needed (itching legs).  12/15/19  Yes Colon Branch, MD  levothyroxine (SYNTHROID) 88 MCG tablet Take 1 tablet (88 mcg total) by mouth daily before breakfast. 08/11/19  Yes Paz, Alda Berthold, MD  Multiple Vitamin (MULTIVITAMIN WITH MINERALS) TABS tablet Take 1 tablet by mouth daily. 01/14/20  Yes Mercy Riding, MD  OVER THE COUNTER MEDICATION Apply 1 application topically 5 (five) times daily as needed (back pain). "Comfrey Salve"   Yes [provider]  polyethylene glycol (MIRALAX / GLYCOLAX) 17 g packet Take 17 g by mouth daily as needed (constipation).    Yes [provider]  senna-docusate (SENOKOT-S) 8.6-50 MG tablet Take 2 tablets by mouth 2 (two) times daily as needed for mild constipation or moderate constipation. 01/12/20  Yes Mercy Riding, MD  traMADol (ULTRAM) 50 MG tablet Take 50 mg by mouth once.   Yes [provider]  Vitamin D, Ergocalciferol, (DRISDOL) 1.25 MG (50000 UNIT) CAPS capsule Take 1 capsule (50,000 Units total) by mouth every 7 (seven) days. 01/17/20  Yes Mercy Riding, MD    Physical Exam: Vitals:   01/28/20 0900 01/28/20 0915 01/28/20 1015 01/28/20 1245  BP: (!) 167/48 (!) 152/54 (!) 182/54 (!) 154/54  Pulse: (!)  34 (!) 38 (!) 40 (!) 38  Resp: 18 (!) _0 Temp:      TempSrc:      SpO2: 99% 98% 100% 96%    Constitutional: NAD, calm, comfortable Vitals:   01/28/20 0900 01/28/20 0915 01/28/20 1015 01/28/20 1245  BP: (!) 167/48 (!) 152/54 (!) 182/54 (!) 154/54  Pulse: (!) 34 (!) 38 (!) 40 (!) 38  Resp: 18 (!) _1 Temp:      TempSrc:      SpO2: 99% 98% 100% 96%   Exam limited by poor patient cooperation. General: WNWD woman in no distress. Eyes: PERRL, lids and conjunctivae normal ENMT: Mucous membranes are moist. Neck: normal, supple, no masses, no thyromegaly Respiratory:  no wheezing, no crackles. Normal respiratory effort. No accessory muscle use.  Cardiovascular: Regular bradycardia, no murmurs / rubs / gallops. No extremity edema. 2+ pedal pulses. No carotid bruits.  Abdomen: no tenderness, no masses palpated. No hepatosplenomegaly. Bowel sounds positive.  Musculoskeletal: no clubbing / cyanosis. No joint deformity upper  Extremities. 5th toe deformity left foot.  Good ROM, no contractures. Normal muscle tone.  Skin: no rashes, lesions, ulcers. No induration Neurologic: CN 2-12 grossly intact. Sensation intact, hyperaesthesia noted. Good muscle strength Psychiatric: Not oriented to place or context. Mildly agitated. (Anything < 9 systems with 2 bullets each down codes to level 1) (If patient refuses exam can't bill higher level) (Make sure to document decubitus ulcers present on admission -- if possible -- and whether patient has chronic indwelling catheter at time of admission)  Labs on Admission: I have personally reviewed following labs and imaging studies  CBC: Recent Labs  Lab 01/27/20 1903 01/28/20 0854  WBC 8.9 9.1  NEUTROABS  --  6.4  HGB 13.8 13.5  HCT 42.2 42.4  MCV 92.1 95.3  PLT 284 010   Basic Metabolic Panel: Recent Labs  Lab 01/27/20 1903 01/28/20 0854  NA 143 139  K 3.6 3.3*  CL 109 108  CO2 23 23  GLUCOSE 106* 99  BUN 18 18  CREATININE  0.67 0.57  CALCIUM 9.1 8.6*   GFR: Estimated Creatinine Clearance: 52.7 mL/min (by C-G formula based on SCr of 0.57 mg/dL). Liver Function Tests: Recent Labs  Lab 01/28/20 0854  AST 181*  ALT 151*  ALKPHOS 374*  BILITOT 2.9*  PROT 5.9*  ALBUMIN 2.9*   Recent Labs  Lab 01/28/20 1301  LIPASE 14   No results for input(s): AMMONIA in the last 168 hours. Coagulation Profile: No results for input(s): INR, PROTIME in the last 168 hours. Cardiac Enzymes: No results for input(s): CKTOTAL, CKMB, CKMBINDEX, TROPONINI in the last 168 hours. BNP (last 3 results) No results for input(s): PROBNP in the last 8760 hours. HbA1C: No results for input(s): HGBA1C in the last 72 hours. CBG: No results for input(s): GLUCAP in the last 168 hours. Lipid Profile: No results for input(s): CHOL, HDL, LDLCALC, TRIG, CHOLHDL, LDLDIRECT in the last 72 hours. Thyroid Function Tests: No results for input(s): TSH, T4TOTAL, FREET4, T3FREE, THYROIDAB in the last 72 hours. Anemia Panel: No results for input(s): VITAMINB12, FOLATE, FERRITIN, TIBC, IRON, RETICCTPCT in the last 72 hours. Urine analysis:    Component Value Date/Time   COLORURINE AMBER (A) 01/28/2020 0759   APPEARANCEUR CLEAR 01/28/2020 0759   LABSPEC 1.021 01/28/2020 0759   PHURINE 5.0 01/28/2020 0759   GLUCOSEU NEGATIVE 01/28/2020 0759   GLUCOSEU NEGATIVE 10/13/2014 1615   HGBUR NEGATIVE 01/28/2020 0759   BILIRUBINUR NEGATIVE 01/28/2020 0759   KETONESUR 20 (A) 01/28/2020 0759   PROTEINUR 30 (A) 01/28/2020 0759   UROBILINOGEN 0.2 10/13/2014 1615   NITRITE NEGATIVE 01/28/2020 0759   LEUKOCYTESUR TRACE (A) 01/28/2020 0759    Radiological Exams on Admission: MR THORACIC SPINE WO CONTRAST  Result Date: 01/28/2020 CLINICAL DATA:  Recurrent back pain. History of compression fracture status post augmentation. EXAM: MRI THORACIC AND LUMBAR SPINE WITHOUT CONTRAST TECHNIQUE: Multiplanar and multiecho pulse sequences of the thoracic and  lumbar spine were obtained without intravenous contrast. COMPARISON:  Lumbar spine MRI 01/03/2020 FINDINGS: MRI THORACIC SPINE FINDINGS Alignment:  Normal. Vertebrae: T12 compression fracture status post augmentation with unchanged mild vertebral body height loss. T10 superior endplate compression fracture with 15% vertebral body height loss and mild marrow edema along a visible fracture line without retropulsion. No suspicious marrow lesion. Small hemangiomas in the T2 and T9 vertebral bodies. Cord:  Normal signal and morphology. Paraspinal and other soft tissues: 1 cm T2 hyperintense focus in  the upper pole of the left kidney, likely a cyst. Disc levels: Minimal disc bulging at T11-12 with slight posterior bowing of the T12 superior endplate. Scattered mild thoracic facet arthrosis. No spinal or neural foraminal stenosis. MRI LUMBAR SPINE FINDINGS Segmentation:  Standard. Alignment: Mild straightening of the normal lumbar lordosis with slight left convex curvature of the lumbar spine. No significant listhesis. Vertebrae: Previously augmented L1 compression fracture with unchanged vertebral body height loss. Interval augmentation of an L2 compression fracture with progressive height loss now measuring 40% without retropulsion. Persistent marrow edema throughout much of the L2 vertebral body with less pronounced edema at L1. Mild chronic posterior L5 vertebral body height loss. No new lumbar spine fracture. No suspicious osseous lesion. Conus medullaris and cauda equina: Conus extends to the upper L1 level. Conus and cauda equina appear normal. Paraspinal and other soft tissues: Small left upper pole renal cyst as noted above. Disc levels: L1-2: Minimal leftward disc bulging without stenosis, unchanged. L2-3: Minimal disc bulging eccentric to the left without stenosis, unchanged. L3-4: Minimal disc bulging and facet arthrosis without stenosis, unchanged. L4-5: Moderate disc space narrowing. Mild disc bulging and  facet arthrosis without stenosis, unchanged. L5-S1: Mild disc space narrowing. Minimal disc bulging and spurring without stenosis, unchanged. IMPRESSION: 1. Acute T10 compression fracture with mild height loss. 2. T12-L2 compressions fracture status post augmentation. 3. No thoracic or lumbar spinal canal or neural foraminal stenosis. Electronically Signed   By: Logan Bores M.D.   On: 01/28/2020 12:39   MR LUMBAR SPINE WO CONTRAST  Result Date: 01/28/2020 CLINICAL DATA:  Recurrent back pain. History of compression fracture status post augmentation. EXAM: MRI THORACIC AND LUMBAR SPINE WITHOUT CONTRAST TECHNIQUE: Multiplanar and multiecho pulse sequences of the thoracic and lumbar spine were obtained without intravenous contrast. COMPARISON:  Lumbar spine MRI 01/03/2020 FINDINGS: MRI THORACIC SPINE FINDINGS Alignment:  Normal. Vertebrae: T12 compression fracture status post augmentation with unchanged mild vertebral body height loss. T10 superior endplate compression fracture with 15% vertebral body height loss and mild marrow edema along a visible fracture line without retropulsion. No suspicious marrow lesion. Small hemangiomas in the T2 and T9 vertebral bodies. Cord:  Normal signal and morphology. Paraspinal and other soft tissues: 1 cm T2 hyperintense focus in the upper pole of the left kidney, likely a cyst. Disc levels: Minimal disc bulging at T11-12 with slight posterior bowing of the T12 superior endplate. Scattered mild thoracic facet arthrosis. No spinal or neural foraminal stenosis. MRI LUMBAR SPINE FINDINGS Segmentation:  Standard. Alignment: Mild straightening of the normal lumbar lordosis with slight left convex curvature of the lumbar spine. No significant listhesis. Vertebrae: Previously augmented L1 compression fracture with unchanged vertebral body height loss. Interval augmentation of an L2 compression fracture with progressive height loss now measuring 40% without retropulsion. Persistent  marrow edema throughout much of the L2 vertebral body with less pronounced edema at L1. Mild chronic posterior L5 vertebral body height loss. No new lumbar spine fracture. No suspicious osseous lesion. Conus medullaris and cauda equina: Conus extends to the upper L1 level. Conus and cauda equina appear normal. Paraspinal and other soft tissues: Small left upper pole renal cyst as noted above. Disc levels: L1-2: Minimal leftward disc bulging without stenosis, unchanged. L2-3: Minimal disc bulging eccentric to the left without stenosis, unchanged. L3-4: Minimal disc bulging and facet arthrosis without stenosis, unchanged. L4-5: Moderate disc space narrowing. Mild disc bulging and facet arthrosis without stenosis, unchanged. L5-S1: Mild disc space narrowing. Minimal disc bulging and spurring  without stenosis, unchanged. IMPRESSION: 1. Acute T10 compression fracture with mild height loss. 2. T12-L2 compressions fracture status post augmentation. 3. No thoracic or lumbar spinal canal or neural foraminal stenosis. Electronically Signed   By: Logan Bores M.D.   On: 01/28/2020 12:39   CT ABDOMEN PELVIS W CONTRAST  Result Date: 01/28/2020 CLINICAL DATA:  Abdominal distension EXAM: CT ABDOMEN AND PELVIS WITH CONTRAST TECHNIQUE: Multidetector CT imaging of the abdomen and pelvis was performed using the standard protocol following bolus administration of intravenous contrast. CONTRAST:  80m OMNIPAQUE IOHEXOL 300 MG/ML  SOLN COMPARISON:  None. FINDINGS: Lower chest: Bibasilar scarring.  Mild cardiomegaly. Hepatobiliary: No focal liver lesion. Mildly distended gallbladder. No wall thickening or calculi. Mild central intrahepatic duct dilatation. The common bile duct tapers without evidence stone or mass. Pancreas: Atrophy.  Otherwise unremarkable. Spleen: Unremarkable. Adrenals/Urinary Tract: Adrenals are unremarkable. Too small to characterize hypoattenuating lesion of the upper pole of the left kidney. No hydronephrosis.  Bladder is unremarkable. Stomach/Bowel: The stomach is within normal limits. Bowel is normal in caliber. Extensive distal colonic diverticulosis. Moderate stool burden. Appendix is not visualized. Vascular/Lymphatic: Aortic atherosclerosis. No enlarged abdominal or pelvic lymph nodes. Reproductive: Status post hysterectomy. No adnexal masses. Other: No ascites.  No free air. Musculoskeletal: Decreased osseous mineralization. Compression deformity of T12-L2 with segment augmentation. Multilevel degenerative changes. IMPRESSION: Mildly distended gallbladder without additional evidence of acute cholecystitis. Mild central intrahepatic duct dilatation. Common bile duct tapers without stone or mass. Extensive colonic diverticulosis without evidence of diverticulitis. Aortic Atherosclerosis (ICD10-I70.0). Electronically Signed   By: PMacy MisM.D.   On: 01/28/2020 11:17    EKG: Independently reviewed. No EKG at todays eval  Assessment/Plan Active Problems:   Closed wedge compression fracture of T10 vertebra (HCC)   Weight loss   Abnormal LFTs   Atrioventricular block, Mobitz type 1, Wenckebach   Bradycardia  (please populate well all problems here in Problem List. (For example, if patient is on BP meds at home and you resume or decide to hold them, it is a problem that needs to be her. Same for CAD, COPD, HLD and so on)   1. Compression fracture T10 - patient with multiple compression fractures over past two months now with recurrence. She has osteoorosis. She has been started on miacalcin nasal spray and high dose D3 in the past several weeks. Plan Med/surg admit ` IR consultation Dr. DEstanislado Pandyfor possible kyphoplasty  Ionized serum calcium and PTH levels  PT/OT eval after kyphoplasty  2. Pain control - Plan APAP 1g TID on schedule  Ketorolac 15 mg q 6prn  Avoid narcotics 2/2 dementia and confustion  3. Cardio - patient with h/o bradycardia. Had EKG 12/23/19 read as A.Fib with bradycardia.  2D eco 01/03/20 EF 55-60%, mild MoV regurg, possible Wenkebach type 1. Patient, and duaghter, do not want to consider pacemaker or other interventional cardiology testing/treatment Plan Med-surg admit  Avoid meds that may contribute to bradycardia  Family aware of risks of complete heart block.  4. GI - patient with weight loss, poor appetite. Mild elevation in LFT's and uncexplained tapering of CBD. Discussed possible causes with daughter and diagnostic options including MRCP or ERCP. Given that no treatment for malignancy would be acceptable and given only minimal elevation of LFTs risk out weighs benefit of further evaluation.  5. GOC - patient and family have met with palliative care service 2/18 and 01/12/20: patient is DNR, no tube feed, no ICU care. Discussed artificial hydration in the face  of poor po intake. At this time renal function normal. Will hold off on IVF for now.  6. TOC - family cannot care for her at home.she has been found to be ineligible for hospice care on 3 previous evaluations. Need to explore re-eval by hospice vs SNF for rehab vs long-term care.  DVT prophylaxis: heparin sq pending procedure (Lovenox/Heparin/SCD's/anticoagulated/None (if comfort care) Code Status: DNR (Full/Partial (specify details) Family Communication: Discussed with daughter (Specify name, relationship. Do not write "discussed with patient". Specify tel # if discussed over the phone) Disposition Plan: TBD (specify when and where you expect patient to be discharged) Consults called: Dr. Estanislado Pandy for IR (with names) Admission status: Inpatient (inpatient / obs / tele / medical floor / SDU)   Adella Hare MD [facility] Pager [phone]  If 7PM-7AM, please contact night-coverage [email or web address] Password [facility]  01/28/2020, 3:03 PM

## 2020-01-28 NOTE — Telephone Encounter (Addendum)
Treatment will be per the ER doctor.

## 2020-01-28 NOTE — ED Provider Notes (Signed)
[facility] EMERGENCY DEPARTMENT Provider Note   CSN: [id number] Arrival date & time: 01/28/20  3016     History Chief Complaint  Patient presents with  . Back Pain    Amy Weiss is a 85 y.o. female.  Patient is an 85 year old dementia patient with severe osteoporosis resulting in multiple compression fractures and kyphoplasty procedures presenting to the emergency department for back pain.  The patient's daughter is at bedside who provides most of the history as the patient is a level 5 caveat due to her dementia.  Patient's daughter states that the patient was discharged from the hospital after kyphoplasty procedure and sent to rehab.  Reports that insurance ran out and she was discharged yesterday from the rehab.  Patient's daughter states that as soon as she was discharged from the rehab she had acute onset of severe back pain.  Reports that she was doing very well at the rehab until up at this point.  Reports that there was no fall or injury and that the patient forgets her "back precautions" and can bend and twist the wrong way resulting in a compression fracture.  She came to the [facility], ED but there was a 10-hour wait so she took her to [place] instead.  [facility] performed x-rays which per patient, showed a new L5 compression fracture.  Patient's daughter states that they did not have IR or neurosurgery at the hospital for any type of kyphoplasty and that physical therapy would only be performed.  The daughter then decided to take her back to [facility] this morning.  The daughter is requesting that we get an MRI and consult IR for another kyphoplasty.        Past Medical History:  Diagnosis Date  . Diabetes mellitus 4/09   A1C-6  . Familial tremor   . Hyperlipidemia   . Hypothyroidism   . Osteoporosis   . Tachycardia    AV Re-entry, s/p ablation aprox 2005  . TIA (transient ischemic attack) 12/09    Patient Active Problem List   Diagnosis Date Noted  .  Closed wedge compression fracture of T10 vertebra (HCC) 01/28/2020  . Weight loss 01/28/2020  . Abnormal LFTs 01/28/2020  . Weakness generalized   . DNR (do not resuscitate)   . Cognitive changes   . Senile dementia uncomp, without behavioral disturbance (HCC) 01/06/2020  . Hypothyroid 01/06/2020  . Back pain 01/02/2020  . Palliative care by specialist   . Encounter for hospice care discussion   . Bradycardia 12/24/2019  . Lumbar compression fracture, closed, initial encounter (HCC) 12/24/2019  . Lumbar compression fracture (HCC) 12/23/2019  . Acute bilateral low back pain without sciatica 11/26/2019  . PCP NOTES >>>> 10/18/2015  . Atrioventricular block, Mobitz type 1, Wenckebach 12/10/2014  . Eczema 12/29/2011  . Annual physical exam 05/09/2011  . SKIN LESION 05/06/2010  . Hyperglycemia 11/19/2008  . TIA 11/19/2008  . Hyperlipidemia 03/04/2008  . FAMILIAL TREMOR 03/04/2008  . Arrhythmia--h/o AV reentry tachycardia 03/04/2008  . ARTHRALGIA 03/04/2008  . OSTEOPOROSIS 03/04/2008  . Closed compression fracture of L2 lumbar vertebra, initial encounter (HCC) 04/05/2007    Past Surgical History:  Procedure Laterality Date  . ABDOMINAL HYSTERECTOMY     no oophorectomy  . BLADDER SURGERY     x 2 in the 80s  . BREAST BIOPSY     L (-)  . IR KYPHO EA ADDL LEVEL THORACIC OR LUMBAR  12/29/2019  . IR KYPHO LUMBAR INC FX REDUCE BONE  BX UNI/BIL CANNULATION INC/IMAGING  01/06/2020  . IR KYPHO THORACIC WITH BONE BIOPSY  12/29/2019     OB History   No obstetric history on file.     Family History  Problem Relation Age of Onset  . Pancreatic cancer Brother   . Pancreatic cancer Father   . Coronary artery disease Mother 15  . Breast cancer Neg Hx   . Colon cancer Neg Hx     Social History   Tobacco Use  . Smoking status: Former Smoker    Quit date: 06/20/2012    Years since quitting: 7.6  . Smokeless tobacco: Never Used  Substance Use Topics  . Alcohol use: Yes    Comment: beer  rarely  . Drug use: No    Home Medications Prior to Admission medications   Medication Sig Start Date End Date Taking? Authorizing Provider  acetaminophen (TYLENOL) 500 MG tablet Take 2 tablets (1,000 mg total) by mouth every 6 (six) hours as needed. 01/12/20 01/11/21 Yes Almon Hercules, MD  amLODipine (NORVASC) 2.5 MG tablet Take 2.5 mg by mouth daily.   Yes [provider]  aspirin EC 81 MG tablet Take 81 mg by mouth daily.   Yes [provider]  calcitonin, salmon, (MIACALCIN/FORTICAL) 200 UNIT/ACT nasal spray Place 1 spray into alternate nostrils daily. 01/13/20  Yes Almon Hercules, MD  calcium-vitamin D (OSCAL 500/200 D-3) 500-200 MG-UNIT tablet Take 1 tablet by mouth 2 (two) times daily. 01/12/20 01/11/21 Yes Almon Hercules, MD  diclofenac Sodium (VOLTAREN) 1 % GEL Apply 2 g topically 4 (four) times daily. 01/12/20  Yes Almon Hercules, MD  HYDROcodone-acetaminophen (NORCO/VICODIN) 5-325 MG tablet Take 1 tablet by mouth every 4 (four) hours as needed for moderate pain.   Yes [provider]  hydrocortisone 2.5 % cream Apply topically 2 (two) times daily. Patient taking differently: Apply 1 application topically 2 (two) times daily as needed (itching legs).  12/15/19  Yes [name], MD  levothyroxine (SYNTHROID) 88 MCG tablet Take 1 tablet (88 mcg total) by mouth daily before breakfast. 08/11/19  Yes Paz, Nolon Rod, MD  Multiple Vitamin (MULTIVITAMIN WITH MINERALS) TABS tablet Take 1 tablet by mouth daily. 01/14/20  Yes Almon Hercules, MD  OVER THE COUNTER MEDICATION Apply 1 application topically 5 (five) times daily as needed (back pain). "Comfrey Salve"   Yes [provider]  polyethylene glycol (MIRALAX / GLYCOLAX) 17 g packet Take 17 g by mouth daily as needed (constipation).    Yes [provider]  senna-docusate (SENOKOT-S) 8.6-50 MG tablet Take 2 tablets by mouth 2 (two) times daily as needed for mild constipation or moderate constipation. 01/12/20  Yes  Almon Hercules, MD  traMADol (ULTRAM) 50 MG tablet Take 50 mg by mouth once.   Yes [provider]  Vitamin D, Ergocalciferol, (DRISDOL) 1.25 MG (50000 UNIT) CAPS capsule Take 1 capsule (50,000 Units total) by mouth every 7 (seven) days. 01/17/20  Yes Almon Hercules, MD    Allergies    Sulfonamide derivatives  Review of Systems   Review of Systems  Unable to perform ROS: Dementia  Constitutional: Negative for chills and fever.  Musculoskeletal: Positive for back pain.    Physical Exam Updated Vital Signs BP (!) 154/54   Pulse (!) 38   Temp 97.7 F (36.5 C) (Oral)   Resp 18   SpO2 96%   Physical Exam Vitals and nursing note reviewed.  Constitutional:  General: She is not in acute distress.    Appearance: Normal appearance. She is not ill-appearing, toxic-appearing or diaphoretic.  HENT:     Head: Normocephalic.  Eyes:     Conjunctiva/sclera: Conjunctivae normal.  Pulmonary:     Effort: Pulmonary effort is normal.  Musculoskeletal:     Comments: Patient reporting severe back pain and will not cooperate with me to perform proper examination of her lumbar spine.  She was able to ambulate into the hospital.  Skin:    General: Skin is dry.  Neurological:     Mental Status: She is alert.     Sensory: No sensory deficit.     Deep Tendon Reflexes: Reflexes normal.     Comments: Able to ambulate but with significant pain.  Psychiatric:        Mood and Affect: Mood normal.     ED Results / Procedures / Treatments   Labs (all labs ordered are listed, but only abnormal results are displayed) Labs Reviewed  URINALYSIS, ROUTINE W REFLEX MICROSCOPIC - Abnormal; Notable for the following components:      Result Value   Color, Urine AMBER (*)    Ketones, ur 20 (*)    Protein, ur 30 (*)    Leukocytes,Ua TRACE (*)    All other components within normal limits  COMPREHENSIVE METABOLIC PANEL - Abnormal; Notable for the following components:   Potassium 3.3 (*)     Calcium 8.6 (*)    Total Protein 5.9 (*)    Albumin 2.9 (*)    AST 181 (*)    ALT 151 (*)    Alkaline Phosphatase 374 (*)    Total Bilirubin 2.9 (*)    All other components within normal limits  CBC WITH DIFFERENTIAL/PLATELET - Abnormal; Notable for the following components:   RDW 15.9 (*)    Monocytes Absolute 1.1 (*)    All other components within normal limits  ACETAMINOPHEN LEVEL - Abnormal; Notable for the following components:   Acetaminophen (Tylenol), Serum <10 (*)    All other components within normal limits  SARS CORONAVIRUS 2 (TAT 6-24 HRS)  LIPASE, BLOOD  HEPATITIS PANEL, ACUTE    EKG None  Radiology MR THORACIC SPINE WO CONTRAST  Result Date: 01/28/2020 CLINICAL DATA:  Recurrent back pain. History of compression fracture status post augmentation. EXAM: MRI THORACIC AND LUMBAR SPINE WITHOUT CONTRAST TECHNIQUE: Multiplanar and multiecho pulse sequences of the thoracic and lumbar spine were obtained without intravenous contrast. COMPARISON:  Lumbar spine MRI 01/03/2020 FINDINGS: MRI THORACIC SPINE FINDINGS Alignment:  Normal. Vertebrae: T12 compression fracture status post augmentation with unchanged mild vertebral body height loss. T10 superior endplate compression fracture with 15% vertebral body height loss and mild marrow edema along a visible fracture line without retropulsion. No suspicious marrow lesion. Small hemangiomas in the T2 and T9 vertebral bodies. Cord:  Normal signal and morphology. Paraspinal and other soft tissues: 1 cm T2 hyperintense focus in the upper pole of the left kidney, likely a cyst. Disc levels: Minimal disc bulging at T11-12 with slight posterior bowing of the T12 superior endplate. Scattered mild thoracic facet arthrosis. No spinal or neural foraminal stenosis. MRI LUMBAR SPINE FINDINGS Segmentation:  Standard. Alignment: Mild straightening of the normal lumbar lordosis with slight left convex curvature of the lumbar spine. No significant listhesis.  Vertebrae: Previously augmented L1 compression fracture with unchanged vertebral body height loss. Interval augmentation of an L2 compression fracture with progressive height loss now measuring 40% without retropulsion. Persistent marrow edema  throughout much of the L2 vertebral body with less pronounced edema at L1. Mild chronic posterior L5 vertebral body height loss. No new lumbar spine fracture. No suspicious osseous lesion. Conus medullaris and cauda equina: Conus extends to the upper L1 level. Conus and cauda equina appear normal. Paraspinal and other soft tissues: Small left upper pole renal cyst as noted above. Disc levels: L1-2: Minimal leftward disc bulging without stenosis, unchanged. L2-3: Minimal disc bulging eccentric to the left without stenosis, unchanged. L3-4: Minimal disc bulging and facet arthrosis without stenosis, unchanged. L4-5: Moderate disc space narrowing. Mild disc bulging and facet arthrosis without stenosis, unchanged. L5-S1: Mild disc space narrowing. Minimal disc bulging and spurring without stenosis, unchanged. IMPRESSION: 1. Acute T10 compression fracture with mild height loss. 2. T12-L2 compressions fracture status post augmentation. 3. No thoracic or lumbar spinal canal or neural foraminal stenosis. Electronically Signed   By: Sebastian AcheAllen  Grady M.D.   On: 01/28/2020 12:39   MR LUMBAR SPINE WO CONTRAST  Result Date: 01/28/2020 CLINICAL DATA:  Recurrent back pain. History of compression fracture status post augmentation. EXAM: MRI THORACIC AND LUMBAR SPINE WITHOUT CONTRAST TECHNIQUE: Multiplanar and multiecho pulse sequences of the thoracic and lumbar spine were obtained without intravenous contrast. COMPARISON:  Lumbar spine MRI 01/03/2020 FINDINGS: MRI THORACIC SPINE FINDINGS Alignment:  Normal. Vertebrae: T12 compression fracture status post augmentation with unchanged mild vertebral body height loss. T10 superior endplate compression fracture with 15% vertebral body height loss  and mild marrow edema along a visible fracture line without retropulsion. No suspicious marrow lesion. Small hemangiomas in the T2 and T9 vertebral bodies. Cord:  Normal signal and morphology. Paraspinal and other soft tissues: 1 cm T2 hyperintense focus in the upper pole of the left kidney, likely a cyst. Disc levels: Minimal disc bulging at T11-12 with slight posterior bowing of the T12 superior endplate. Scattered mild thoracic facet arthrosis. No spinal or neural foraminal stenosis. MRI LUMBAR SPINE FINDINGS Segmentation:  Standard. Alignment: Mild straightening of the normal lumbar lordosis with slight left convex curvature of the lumbar spine. No significant listhesis. Vertebrae: Previously augmented L1 compression fracture with unchanged vertebral body height loss. Interval augmentation of an L2 compression fracture with progressive height loss now measuring 40% without retropulsion. Persistent marrow edema throughout much of the L2 vertebral body with less pronounced edema at L1. Mild chronic posterior L5 vertebral body height loss. No new lumbar spine fracture. No suspicious osseous lesion. Conus medullaris and cauda equina: Conus extends to the upper L1 level. Conus and cauda equina appear normal. Paraspinal and other soft tissues: Small left upper pole renal cyst as noted above. Disc levels: L1-2: Minimal leftward disc bulging without stenosis, unchanged. L2-3: Minimal disc bulging eccentric to the left without stenosis, unchanged. L3-4: Minimal disc bulging and facet arthrosis without stenosis, unchanged. L4-5: Moderate disc space narrowing. Mild disc bulging and facet arthrosis without stenosis, unchanged. L5-S1: Mild disc space narrowing. Minimal disc bulging and spurring without stenosis, unchanged. IMPRESSION: 1. Acute T10 compression fracture with mild height loss. 2. T12-L2 compressions fracture status post augmentation. 3. No thoracic or lumbar spinal canal or neural foraminal stenosis.  Electronically Signed   By: Sebastian AcheAllen  Grady M.D.   On: 01/28/2020 12:39   CT ABDOMEN PELVIS W CONTRAST  Result Date: 01/28/2020 CLINICAL DATA:  Abdominal distension EXAM: CT ABDOMEN AND PELVIS WITH CONTRAST TECHNIQUE: Multidetector CT imaging of the abdomen and pelvis was performed using the standard protocol following bolus administration of intravenous contrast. CONTRAST:  75mL OMNIPAQUE IOHEXOL 300  MG/ML  SOLN COMPARISON:  None. FINDINGS: Lower chest: Bibasilar scarring.  Mild cardiomegaly. Hepatobiliary: No focal liver lesion. Mildly distended gallbladder. No wall thickening or calculi. Mild central intrahepatic duct dilatation. The common bile duct tapers without evidence stone or mass. Pancreas: Atrophy.  Otherwise unremarkable. Spleen: Unremarkable. Adrenals/Urinary Tract: Adrenals are unremarkable. Too small to characterize hypoattenuating lesion of the upper pole of the left kidney. No hydronephrosis. Bladder is unremarkable. Stomach/Bowel: The stomach is within normal limits. Bowel is normal in caliber. Extensive distal colonic diverticulosis. Moderate stool burden. Appendix is not visualized. Vascular/Lymphatic: Aortic atherosclerosis. No enlarged abdominal or pelvic lymph nodes. Reproductive: Status post hysterectomy. No adnexal masses. Other: No ascites.  No free air. Musculoskeletal: Decreased osseous mineralization. Compression deformity of T12-L2 with segment augmentation. Multilevel degenerative changes. IMPRESSION: Mildly distended gallbladder without additional evidence of acute cholecystitis. Mild central intrahepatic duct dilatation. Common bile duct tapers without stone or mass. Extensive colonic diverticulosis without evidence of diverticulitis. Aortic Atherosclerosis (ICD10-I70.0). Electronically Signed   By: Guadlupe Spanish M.D.   On: 01/28/2020 11:17    Procedures Procedures (including critical care time)  Medications Ordered in ED Medications  acetaminophen (TYLENOL) tablet 1,000  mg (has no administration in time range)  calcitonin (salmon) (MIACALCIN/FORTICAL) nasal spray 1 spray (has no administration in time range)  levothyroxine (SYNTHROID) tablet 88 mcg (has no administration in time range)  polyethylene glycol (MIRALAX / GLYCOLAX) packet 17 g (has no administration in time range)  senna-docusate (Senokot-S) tablet 2 tablet (has no administration in time range)  calcium-vitamin D (OSCAL WITH D) 500-200 MG-UNIT per tablet 1 tablet (has no administration in time range)  Vitamin D (Ergocalciferol) (DRISDOL) capsule 50,000 Units (has no administration in time range)  diclofenac Sodium (VOLTAREN) 1 % topical gel 2 g (has no administration in time range)  heparin injection 5,000 Units (has no administration in time range)  sodium chloride flush (NS) 0.9 % injection 3 mL (has no administration in time range)  sodium chloride flush (NS) 0.9 % injection 3 mL (has no administration in time range)  0.9 %  sodium chloride infusion (has no administration in time range)  ketorolac (TORADOL) 15 MG/ML injection 15 mg (has no administration in time range)  HYDROmorphone (DILAUDID) injection 0.5 mg (0.5 mg Intravenous Given 01/28/20 1026)  sodium chloride 0.9 % bolus 1,000 mL (0 mLs Intravenous Stopped 01/28/20 1000)  LORazepam (ATIVAN) injection 0.5 mg (0.5 mg Intravenous Given 01/28/20 1026)  iohexol (OMNIPAQUE) 300 MG/ML solution 75 mL (75 mLs Intravenous Contrast Given 01/28/20 1056)    ED Course  I have reviewed the triage vital signs and the nursing notes.  Pertinent labs & imaging results that were available during my care of the patient were reviewed by me and considered in my medical decision making (see chart for details).  Clinical Course as of Jan 27 1414  Wed Jan 28, 2020  0718 Dementia patient with osteoporosis and multiple compression fractures and chronic back pain brought in by daughter for acute on chronic back pain after being discharged from rehab facility  yesterday.  Currently, daughter is refusing x-rays here as she had already brought the patient to [facility] yesterday and had x-rays reportedly showing a new compression fracture in L5 yesterday.  Discussed with the secretary who has started process to obtain films from [facility]. CM/SW consult placed as patient daughter unable to provide adequate care at all times which it appears what the patient needs. Patient may need to be admitted for pain control. Will likely  consult IR as well. Previous kyphoplasty performed by Luanne Bras M.D.   [KM]  [phone] Patient's daughters now expressing concern that patient does not drink enough water and may be dehydrated with UTI. Will obtain labs and urinalysis. Still waiting on xrays from [facility]   [KM]  0901 I spoke with Markus Daft MD from IR. Will order MRI w/o contrast to assess acuity of fractures. Dr. Anselm Pancoast will make Dr. Estanislado Pandy aware of patient here in ED.   [KM]  1023 Patient found to have a new transaminitis on her lab work.  Discussed with patient's daughter.  Will order CT scan.  She has been taking Tylenol but no more than 2000 mg daily for her pain.   [KM]  1303 New T10 compression fx found on MRI. No other acute abnormality. Will consult for admission for multiple issues including acute T10 fracture/pain control, new transaminitis- will likely need GI inpatient consult, and social barriers to include needing placement/longterm care   [KM]  1415 Spoke with hospitalist for admission. Also spoke with Phill Myron PA from IR and I placed order for kyphoplasty procedure.    [KM]    Clinical Course User Index [KM] Kristine Royal   MDM Rules/Calculators/A&P                      The patient appears reasonably stabilized for admission considering the current resources, flow, and capabilities available in the ED at this time, and I doubt any other [facility] requiring further screening and/or treatment in the ED prior to admission.  Final Clinical  Impression(s) / ED Diagnoses Final diagnoses:  Compression fracture of body of thoracic vertebra (HCC)  Transaminitis  Compression fracture of T10 vertebra [facility]    Rx / DC Orders ED Discharge Orders    None       Kristine Royal 01/28/20 1415    Dorie Rank, MD 01/29/20 1512

## 2020-01-28 NOTE — ED Triage Notes (Signed)
Pt was here earlier tonight but left w/o being seen.  She continues to experience back pain.  Does have hx of dementia.

## 2020-01-28 NOTE — ED Notes (Signed)
Pt to CT and then to MRI.   Restful without noted pain or anxiety.   Daughter remains in room and options for long term being discussed.  Pt adamant about not being admitted to the hospital and/or SNF.

## 2020-01-28 NOTE — Telephone Encounter (Signed)
Amy Weiss stated she wanted to inform Dr. Drue Novel that she did go to ER. Ms. Granberry had and Xray lastnight and it was discovered she has a new Fracture in her "L  Side Vertebrae. [name] want to to discuss cycle of osteoporosis and confusion/surgery with Provider. Please advise

## 2020-01-28 NOTE — Discharge Planning (Signed)
RNCM spoke with pt daughter, [name] regarding disposition planning.  [name] states patient is active with [facility] for PT services (RNCM confirmed with Kizzie Furnish, RN [facility]); and was discharged from [facility] as she has used all her skilled days. RNCM and daughter spoke extensively regarding pt being unsafe to return home alone and that long term care is the best option for her mom at this time.  RNCM asked [name] to contact insurance regarding Long Term Care options and we can proceed from there.  EDP and EDSW updated.

## 2020-01-28 NOTE — Consult Note (Signed)
Chief Complaint: Patient was seen in consultation today for T10 compression fracture/vertebral augmentation.  Referring Physician(s): Arlyn Dunning  Supervising Physician: Julieanne Cotton  Patient Status: [facility] - ED  History of Present Illness: Amy Weiss is a 85 y.o. female with a past medical history of hyperlipidemia, TIA 2009, dementia, diabetes mellitus, hypothyroidism, osteoporosis, and familial tremor. She is known to IR- she has undergone multiple vertebral augmentation procedures with [place] (T12 and L1 KP 12/29/2019 by Dr. Corliss Skains; L2 KP 01/06/2020 by Dr. Corliss Skains). She presented to [facility] ED this AM with complaint of back pain. In ED, MR thoracic and lumbar spine was ordered which revealed an acute T10 compression fracture. She was admitted for pain management.  MR thoracic/lumbar spine today: 1. Acute T10 compression fracture with mild height loss. 2. T12-L2 compressions fracture status post augmentation. 3. No thoracic or lumbar spinal canal or neural foraminal stenosis.  IR requested by Ronnie Doss, PA-C for possible image-guided T10 KP/VP. Patient laying in bed resting comfortably. She opens eyes to voice and is slightly agitated today- daughter states that she didn't sleep last night and is tired. Complaining of back pain. History difficult to obtain due to agitation.   Past Medical History:  Diagnosis Date  . Diabetes mellitus 4/09   A1C-6  . Familial tremor   . Hyperlipidemia   . Hypothyroidism   . Osteoporosis   . Tachycardia    AV Re-entry, s/p ablation aprox 2005  . TIA (transient ischemic attack) 12/09    Past Surgical History:  Procedure Laterality Date  . ABDOMINAL HYSTERECTOMY     no oophorectomy  . BLADDER SURGERY     x 2 in the 80s  . BREAST BIOPSY     L (-)  . IR KYPHO EA ADDL LEVEL THORACIC OR LUMBAR  12/29/2019  . IR KYPHO LUMBAR INC FX REDUCE BONE BX UNI/BIL CANNULATION INC/IMAGING  01/06/2020  . IR KYPHO THORACIC WITH BONE BIOPSY   12/29/2019    Allergies: Sulfonamide derivatives  Medications: Prior to Admission medications   Medication Sig Start Date End Date Taking? Authorizing Provider  acetaminophen (TYLENOL) 500 MG tablet Take 2 tablets (1,000 mg total) by mouth every 6 (six) hours as needed. 01/12/20 01/11/21 Yes Almon Hercules, MD  amLODipine (NORVASC) 2.5 MG tablet Take 2.5 mg by mouth daily.   Yes [provider]  aspirin EC 81 MG tablet Take 81 mg by mouth daily.   Yes [provider]  calcitonin, salmon, (MIACALCIN/FORTICAL) 200 UNIT/ACT nasal spray Place 1 spray into alternate nostrils daily. 01/13/20  Yes Almon Hercules, MD  calcium-vitamin D (OSCAL 500/200 D-3) 500-200 MG-UNIT tablet Take 1 tablet by mouth 2 (two) times daily. 01/12/20 01/11/21 Yes Almon Hercules, MD  diclofenac Sodium (VOLTAREN) 1 % GEL Apply 2 g topically 4 (four) times daily. 01/12/20  Yes Almon Hercules, MD  HYDROcodone-acetaminophen (NORCO/VICODIN) 5-325 MG tablet Take 1 tablet by mouth every 4 (four) hours as needed for moderate pain.   Yes [provider]  hydrocortisone 2.5 % cream Apply topically 2 (two) times daily. Patient taking differently: Apply 1 application topically 2 (two) times daily as needed (itching legs).  12/15/19  Yes [name], MD  levothyroxine (SYNTHROID) 88 MCG tablet Take 1 tablet (88 mcg total) by mouth daily before breakfast. 08/11/19  Yes Paz, Nolon Rod, MD  Multiple Vitamin (MULTIVITAMIN WITH MINERALS) TABS tablet Take 1 tablet by mouth daily. 01/14/20  Yes Almon Hercules, MD  OVER  THE COUNTER MEDICATION Apply 1 application topically 5 (five) times daily as needed (back pain). "Comfrey Salve"   Yes [provider]  polyethylene glycol (MIRALAX / GLYCOLAX) 17 g packet Take 17 g by mouth daily as needed (constipation).    Yes [provider]  senna-docusate (SENOKOT-S) 8.6-50 MG tablet Take 2 tablets by mouth 2 (two) times daily as needed for mild constipation or moderate  constipation. 01/12/20  Yes Mercy Riding, MD  traMADol (ULTRAM) 50 MG tablet Take 50 mg by mouth once.   Yes [provider]  Vitamin D, Ergocalciferol, (DRISDOL) 1.25 MG (50000 UNIT) CAPS capsule Take 1 capsule (50,000 Units total) by mouth every 7 (seven) days. 01/17/20  Yes Mercy Riding, MD     Family History  Problem Relation Age of Onset  . Pancreatic cancer Brother   . Pancreatic cancer Father   . Coronary artery disease Mother 8  . Breast cancer Neg Hx   . Colon cancer Neg Hx     Social History   Socioeconomic History  . Marital status: Divorced    Spouse name: Not on file  . Number of children: 4  . Years of education: Not on file  . Highest education level: Not on file  Occupational History  . 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 Smoking status: Former Smoker    Quit date: 06/20/2012    Years since quitting: 7.6  . Smokeless tobacco: Never Used  Substance and Sexual Activity  . Alcohol use: Yes    Comment: beer rarely  . Drug use: No  . Sexual activity: Never  Other Topics Concern  . Not on file  Social History Narrative   Single, live by herself, lost a daughter in law   Daughter Apolonio Schneiders lives close by   Ex- husband lives near   Social Determinants of Health   Financial Resource Strain:   . Difficulty of Paying Living Expenses: Not on file  Food Insecurity:   . Worried About Charity fundraiser in the Last Year: Not on file  . Ran Out of Food in the Last Year: Not on file  Transportation Needs:   . Lack of Transportation (Medical): Not on file  . Lack of Transportation (Non-Medical): Not on file  Physical Activity:   . Days of Exercise per Week: Not on file  . Minutes of Exercise per Session: Not on file  Stress:   . Feeling of Stress : Not on file  Social Connections:   . Frequency of Communication with Friends and Family: Not on file  . Frequency of Social Gatherings with Friends and Family: Not on file  .  Attends Religious Services: Not on file  . Active Member of Clubs or Organizations: Not on file  . Attends Archivist Meetings: Not on file  . Marital Status: Not on file     Review of Systems: A 12 point ROS discussed and pertinent positives are indicated in the HPI above.  All other systems are negative.  Review of Systems  Unable to perform ROS: Other (Agitation)  Musculoskeletal: Positive for back pain.    Vital Signs: BP (!) 154/54   Pulse (!) 38   Temp 97.7 F (36.5 C) (Oral)   Resp 18   SpO2 96%   Physical Exam Vitals reviewed.  Constitutional:      General: She is not in acute distress.    Appearance: Normal appearance.  Cardiovascular:  Rate and Rhythm: Normal rate and regular rhythm.     Heart sounds: Normal heart sounds. No murmur.  Pulmonary:     Effort: Pulmonary effort is normal. No respiratory distress.     Breath sounds: Normal breath sounds. No wheezing.  Musculoskeletal:     Comments: Moderate-severe midline mid back pain.  Skin:    General: Skin is warm and dry.  Neurological:     Mental Status: She is alert.      MD Evaluation Airway: WNL Heart: WNL Abdomen: WNL Chest/ Lungs: WNL ASA  Classification: 3 Mallampati/Airway Score: One   Imaging: DG Lumbar Spine 2-3 Views  Result Date: 01/11/2020 CLINICAL DATA:  Fall, L2 compression fracture, prior vertebral augmentation EXAM: LUMBAR SPINE - 2-3 VIEW COMPARISON:  MRI lumbar spine dated 01/03/2020 FINDINGS: Normal lumbar lordosis. Prior vertebral augmentation at T12 and L1. Interval vertebral augmentation at L2. Mild (10-15%) loss of height at T12 and L2. Moderate (40%) loss of height at L1. No retropulsion. Visualized bony pelvis appears intact. IMPRESSION: Vertebral augmentation at T12-L2, with mild to moderate compression fracture deformities, as above. Electronically Signed   By: Charline Bills M.D.   On: 01/11/2020 15:31   MR THORACIC SPINE WO CONTRAST  Result Date:  01/28/2020 CLINICAL DATA:  Recurrent back pain. History of compression fracture status post augmentation. EXAM: MRI THORACIC AND LUMBAR SPINE WITHOUT CONTRAST TECHNIQUE: Multiplanar and multiecho pulse sequences of the thoracic and lumbar spine were obtained without intravenous contrast. COMPARISON:  Lumbar spine MRI 01/03/2020 FINDINGS: MRI THORACIC SPINE FINDINGS Alignment:  Normal. Vertebrae: T12 compression fracture status post augmentation with unchanged mild vertebral body height loss. T10 superior endplate compression fracture with 15% vertebral body height loss and mild marrow edema along a visible fracture line without retropulsion. No suspicious marrow lesion. Small hemangiomas in the T2 and T9 vertebral bodies. Cord:  Normal signal and morphology. Paraspinal and other soft tissues: 1 cm T2 hyperintense focus in the upper pole of the left kidney, likely a cyst. Disc levels: Minimal disc bulging at T11-12 with slight posterior bowing of the T12 superior endplate. Scattered mild thoracic facet arthrosis. No spinal or neural foraminal stenosis. MRI LUMBAR SPINE FINDINGS Segmentation:  Standard. Alignment: Mild straightening of the normal lumbar lordosis with slight left convex curvature of the lumbar spine. No significant listhesis. Vertebrae: Previously augmented L1 compression fracture with unchanged vertebral body height loss. Interval augmentation of an L2 compression fracture with progressive height loss now measuring 40% without retropulsion. Persistent marrow edema throughout much of the L2 vertebral body with less pronounced edema at L1. Mild chronic posterior L5 vertebral body height loss. No new lumbar spine fracture. No suspicious osseous lesion. Conus medullaris and cauda equina: Conus extends to the upper L1 level. Conus and cauda equina appear normal. Paraspinal and other soft tissues: Small left upper pole renal cyst as noted above. Disc levels: L1-2: Minimal leftward disc bulging without  stenosis, unchanged. L2-3: Minimal disc bulging eccentric to the left without stenosis, unchanged. L3-4: Minimal disc bulging and facet arthrosis without stenosis, unchanged. L4-5: Moderate disc space narrowing. Mild disc bulging and facet arthrosis without stenosis, unchanged. L5-S1: Mild disc space narrowing. Minimal disc bulging and spurring without stenosis, unchanged. IMPRESSION: 1. Acute T10 compression fracture with mild height loss. 2. T12-L2 compressions fracture status post augmentation. 3. No thoracic or lumbar spinal canal or neural foraminal stenosis. Electronically Signed   By: Sebastian Ache M.D.   On: 01/28/2020 12:39   MR LUMBAR SPINE WO CONTRAST  Result  Date: 01/28/2020 CLINICAL DATA:  Recurrent back pain. History of compression fracture status post augmentation. EXAM: MRI THORACIC AND LUMBAR SPINE WITHOUT CONTRAST TECHNIQUE: Multiplanar and multiecho pulse sequences of the thoracic and lumbar spine were obtained without intravenous contrast. COMPARISON:  Lumbar spine MRI 01/03/2020 FINDINGS: MRI THORACIC SPINE FINDINGS Alignment:  Normal. Vertebrae: T12 compression fracture status post augmentation with unchanged mild vertebral body height loss. T10 superior endplate compression fracture with 15% vertebral body height loss and mild marrow edema along a visible fracture line without retropulsion. No suspicious marrow lesion. Small hemangiomas in the T2 and T9 vertebral bodies. Cord:  Normal signal and morphology. Paraspinal and other soft tissues: 1 cm T2 hyperintense focus in the upper pole of the left kidney, likely a cyst. Disc levels: Minimal disc bulging at T11-12 with slight posterior bowing of the T12 superior endplate. Scattered mild thoracic facet arthrosis. No spinal or neural foraminal stenosis. MRI LUMBAR SPINE FINDINGS Segmentation:  Standard. Alignment: Mild straightening of the normal lumbar lordosis with slight left convex curvature of the lumbar spine. No significant listhesis.  Vertebrae: Previously augmented L1 compression fracture with unchanged vertebral body height loss. Interval augmentation of an L2 compression fracture with progressive height loss now measuring 40% without retropulsion. Persistent marrow edema throughout much of the L2 vertebral body with less pronounced edema at L1. Mild chronic posterior L5 vertebral body height loss. No new lumbar spine fracture. No suspicious osseous lesion. Conus medullaris and cauda equina: Conus extends to the upper L1 level. Conus and cauda equina appear normal. Paraspinal and other soft tissues: Small left upper pole renal cyst as noted above. Disc levels: L1-2: Minimal leftward disc bulging without stenosis, unchanged. L2-3: Minimal disc bulging eccentric to the left without stenosis, unchanged. L3-4: Minimal disc bulging and facet arthrosis without stenosis, unchanged. L4-5: Moderate disc space narrowing. Mild disc bulging and facet arthrosis without stenosis, unchanged. L5-S1: Mild disc space narrowing. Minimal disc bulging and spurring without stenosis, unchanged. IMPRESSION: 1. Acute T10 compression fracture with mild height loss. 2. T12-L2 compressions fracture status post augmentation. 3. No thoracic or lumbar spinal canal or neural foraminal stenosis. Electronically Signed   By: Sebastian Ache M.D.   On: 01/28/2020 12:39   MR LUMBAR SPINE WO CONTRAST  Result Date: 01/03/2020 CLINICAL DATA:  Worsening back pain. Previous T12 and L1 kyphoplasty. Subsequent fall. EXAM: MRI LUMBAR SPINE WITHOUT CONTRAST TECHNIQUE: Multiplanar, multisequence MR imaging of the lumbar spine was performed. No intravenous contrast was administered. COMPARISON:  12/25/2019 FINDINGS: Segmentation:  5 lumbar type vertebral bodies. Alignment:  Mild straightening of the spine. Vertebrae: Previously augmented compression fractures at T12 and L1 do not show any additional loss of height or unexpected finding. There is a newly seen fracture at L2 at the superior  endplate with loss of height of 10%. No retropulsed. This level was normal previously. Conus medullaris and cauda equina: Conus extends to the L1 level. Conus and cauda equina appear normal. Paraspinal and other soft tissues: Negative Disc levels: Mild posterior bowing of the posterosuperior margins of T12 and L1 but no compressive stenosis. No disc pathology from L1-2 through L3-4. L4-5 shows chronic disc degeneration with mild bulging of the disc but no compressive stenosis. L5-S1 shows disc degeneration with minimal bulging of the disc but no stenosis. IMPRESSION: Good appearance at the previously augmented levels of T12 and L1. No further collapse or unexpected finding. Newly seen superior endplate fracture at L2 with loss of height of only about 10%. No retropulsed bone. This  level was normal previously. Electronically Signed   By: Paulina Fusi M.D.   On: 01/03/2020 02:51   CT ABDOMEN PELVIS W CONTRAST  Result Date: 01/28/2020 CLINICAL DATA:  Abdominal distension EXAM: CT ABDOMEN AND PELVIS WITH CONTRAST TECHNIQUE: Multidetector CT imaging of the abdomen and pelvis was performed using the standard protocol following bolus administration of intravenous contrast. CONTRAST:  86mL OMNIPAQUE IOHEXOL 300 MG/ML  SOLN COMPARISON:  None. FINDINGS: Lower chest: Bibasilar scarring.  Mild cardiomegaly. Hepatobiliary: No focal liver lesion. Mildly distended gallbladder. No wall thickening or calculi. Mild central intrahepatic duct dilatation. The common bile duct tapers without evidence stone or mass. Pancreas: Atrophy.  Otherwise unremarkable. Spleen: Unremarkable. Adrenals/Urinary Tract: Adrenals are unremarkable. Too small to characterize hypoattenuating lesion of the upper pole of the left kidney. No hydronephrosis. Bladder is unremarkable. Stomach/Bowel: The stomach is within normal limits. Bowel is normal in caliber. Extensive distal colonic diverticulosis. Moderate stool burden. Appendix is not visualized.  Vascular/Lymphatic: Aortic atherosclerosis. No enlarged abdominal or pelvic lymph nodes. Reproductive: Status post hysterectomy. No adnexal masses. Other: No ascites.  No free air. Musculoskeletal: Decreased osseous mineralization. Compression deformity of T12-L2 with segment augmentation. Multilevel degenerative changes. IMPRESSION: Mildly distended gallbladder without additional evidence of acute cholecystitis. Mild central intrahepatic duct dilatation. Common bile duct tapers without stone or mass. Extensive colonic diverticulosis without evidence of diverticulitis. Aortic Atherosclerosis (ICD10-I70.0). Electronically Signed   By: Guadlupe Spanish M.D.   On: 01/28/2020 11:17   IR KYPHO LUMBAR INC FX REDUCE BONE BX UNI/BIL CANNULATION INC/IMAGING  Result Date: 01/07/2020 INDICATION: Severe low back pain secondary to compression fracture at L2. EXAM: BALLOON KYPHOPLASTY AT L2 COMPARISON:  MRI of the lumbosacral spine of January 03, 2020. MEDICATIONS: As antibiotic prophylaxis, Ancef 2 g IV was ordered pre-procedure and administered intravenously within 1 hour of incision. ANESTHESIA/SEDATION: Moderate (conscious) sedation was employed during this procedure. A total of Versed 1.5 mg and Fentanyl 50 mcg, and Dilaudid 1 mg IV were administered intravenously. Moderate Sedation Time: 35 minutes. The patient's level of consciousness and vital signs were monitored continuously by radiology nursing throughout the procedure under my direct supervision. FLUOROSCOPY TIME:  Fluoroscopy Time: 13 minutes 24 seconds (1079 mGy) COMPLICATIONS: None immediate. PROCEDURE: Following a full explanation of the procedure along with the potential associated complications, an informed witnessed consent was obtained. The patient was placed prone on the fluoroscopic table. The skin overlying the lumbar region was then prepped and draped in the usual sterile fashion. The both pedicles at L2 were then infiltrated with 0.25% bupivacaine,  followed by the advancement of a 11 gauge Jamshidi into the posterior 1/3 at L2. These were then exchanged for a Kyphon advanced osteo introducer system comprised of a working cannula and a Kyphon osteo drill. Combinations were then advanced over a Kyphon osteo bone pin until the tip of the Kyphon osteo drill was in the posterior third at L2. At this time, the bone pins were removed. In a medial trajectory, the combination were advanced until the tips of the working cannulae was inside the posterior one-third at L2. The osteo drills were removed. Through the working cannulae, a Kyphon inflatable bone tamp 20 x 3 was advanced and positioned with the distal marker 5 mm from the anterior aspect of L2. Crossing of the midline was seen on the AP projection. At this time, the balloon was expanded using contrast via a Kyphon inflation syringe device via microtubing. Inflations were continued until there was apposition with the superior endplate. At  this time, methylmethacrylate mixture was reconstituted with Tobramycin in the Kyphon bone mixing device system. This was then loaded onto the Kyphon bone fillers. The balloons were deflated and removed followed by the instillation of 5 bone filler equivalents of methylmethacrylate mixture with excellent filling in the AP and lateral projections. No extravasation was noted in the disk spaces or posteriorly into the spinal canal. No epidural venous contamination was seen. The working cannula and the bone filler were then retrieved and removed. Hemostasis was achieved at the skin entry sites. IMPRESSION: 1. Status post vertebral body augmentation using balloon kyphoplasty at L2 as described without event. Electronically Signed   By: Julieanne CottonSanjeev  Deveshwar M.D.   On: 01/06/2020 14:57   ECHOCARDIOGRAM COMPLETE  Result Date: 01/03/2020    ECHOCARDIOGRAM REPORT   Patient Name:   Amy Weiss Date of Exam: 01/03/2020 Medical Rec #:  [id number]      Height:       67.0 in Accession #:     [id number]     Weight:       137.0 lb Date of Birth:  1935-06-22     BSA:          1.72 m Patient Age:    84 years       BP:           147/61 mmHg Patient Gender: F              HR:           45 bpm. Exam Location:  Inpatient Procedure: 2D Echo Indications:    aortic valve disorder 424.1  History:        Patient has prior history of Echocardiogram examinations, most                 recent 01/22/2009.  Sonographer:    Delcie RochLauren Pennington Referring Phys: [id number] SYLVESTER I OGBATA IMPRESSIONS  1. Bradycardia noted during study ~40 bpm. Unclear rhythm, could be Wenckebach. EKG correlation recommended.  2. Left ventricular ejection fraction, by estimation, is 55 to 60%. The left ventricle has normal function. The left ventricle has no regional wall motion abnormalities. Left ventricular diastolic parameters are indeterminate.  3. Right ventricular systolic function is normal. The right ventricular size is normal. There is normal pulmonary artery systolic pressure.  4. The mitral valve is degenerative. Mild mitral valve regurgitation.  5. The aortic valve is tricuspid. Aortic valve regurgitation is not visualized. Mild aortic valve stenosis. Aortic valve area, by VTI measures 1.72 cm. Aortic valve mean gradient measures 11.5 mmHg. Aortic valve Vmax measures 2.60 m/s.  6. There is mild (Grade II) layered plaque involving the aortic root.  7. The inferior vena cava is normal in size with greater than 50% respiratory variability, suggesting right atrial pressure of 3 mmHg. Comparison(s): No prior Echocardiogram. FINDINGS  Left Ventricle: Left ventricular ejection fraction, by estimation, is 55 to 60%. The left ventricle has normal function. The left ventricle has no regional wall motion abnormalities. There is no left ventricular hypertrophy. Left ventricular diastolic parameters are indeterminate. Right Ventricle: The right ventricular size is normal. No increase in right ventricular wall thickness. Right ventricular systolic  function is normal. There is normal pulmonary artery systolic pressure. The tricuspid regurgitant velocity is 2.57 m/s, and  with an assumed right atrial pressure of 3 mmHg, the estimated right ventricular systolic pressure is 29.4 mmHg. Left Atrium: Left atrial size was normal in size. Right Atrium: Right atrial size was normal in size.  Pericardium: Trivial pericardial effusion is present. Presence of pericardial fat pad. Mitral Valve: The mitral valve is degenerative in appearance. There is moderate thickening of the mitral valve leaflet(s). There is moderate calcification of the mitral valve leaflet(s). Mild mitral valve regurgitation. Tricuspid Valve: The tricuspid valve is grossly normal. Tricuspid valve regurgitation is mild. Aortic Valve: The aortic valve is tricuspid. . There is moderate thickening and moderate calcification of the aortic valve. Aortic valve regurgitation is not visualized. Mild aortic stenosis is present. There is moderate thickening of the aortic valve. There is moderate calcification of the aortic valve. Aortic valve mean gradient measures 11.5 mmHg. Aortic valve peak gradient measures 27.0 mmHg. Aortic valve area, by VTI measures 1.72 cm. Pulmonic Valve: The pulmonic valve was grossly normal. Pulmonic valve regurgitation is mild. Aorta: The aortic root is normal in size and structure. There is mild (Grade II) layered plaque involving the aortic root. Venous: The inferior vena cava is normal in size with greater than 50% respiratory variability, suggesting right atrial pressure of 3 mmHg. IAS/Shunts: No atrial level shunt detected by color flow Doppler. Additional Comments: Bradycardia noted during study ~40 bpm. Unclear rhythm, could be Wenckebach. EKG correlation recommended.  LEFT VENTRICLE PLAX 2D LVIDd:         4.70 cm LVIDs:         2.80 cm LV PW:         1.10 cm LV IVS:        0.90 cm LVOT diam:     1.80 cm LV SV:         83.72 ml LV SV Index:   42.50 LVOT Area:     2.54 cm   RIGHT VENTRICLE RV S prime:     12.90 cm/s TAPSE (M-mode): 2.1 cm LEFT ATRIUM             Index       RIGHT ATRIUM           Index LA diam:        3.90 cm 2.26 cm/m  RA Area:     12.90 cm LA Vol (A2C):   49.9 ml 28.98 ml/m RA Volume:   29.20 ml  16.96 ml/m LA Vol (A4C):   48.7 ml 28.28 ml/m LA Biplane Vol: 51.8 ml 30.08 ml/m  AORTIC VALVE AV Area (Vmax):    1.48 cm AV Area (Vmean):   1.65 cm AV Area (VTI):     1.72 cm AV Vmax:           260.00 cm/s AV Vmean:          149.000 cm/s AV VTI:            0.487 m AV Peak Grad:      27.0 mmHg AV Mean Grad:      11.5 mmHg LVOT Vmax:         151.00 cm/s LVOT Vmean:        96.350 cm/s LVOT VTI:          0.329 m LVOT/AV VTI ratio: 0.68  AORTA Ao Root diam: 2.80 cm MITRAL VALVE               TRICUSPID VALVE MV Area (PHT): 2.02 cm    TR Peak grad:   26.4 mmHg MV Decel Time: 375 msec    TR Vmax:        257.00 cm/s MV E velocity: 65.60 cm/s MV A velocity: 87.40 cm/s  SHUNTS MV E/A ratio:  0.75        Systemic VTI:  0.33 m                            Systemic Diam: 1.80 cm Lennie Odor MD Electronically signed by Lennie Odor MD Signature Date/Time: 01/03/2020/5:43:24 PM    Final    DG HIP UNILAT WITH PELVIS 2-3 VIEWS RIGHT  Result Date: 01/04/2020 CLINICAL DATA:  Fall, chronic right hip pain EXAM: DG HIP (WITH OR WITHOUT PELVIS) 2-3V RIGHT COMPARISON:  None. FINDINGS: No fracture or dislocation is seen. Bilateral hip joint spaces are symmetric. Visualized bony pelvis appears intact. IMPRESSION: Negative. Electronically Signed   By: Charline Bills M.D.   On: 01/04/2020 01:16    Labs:  CBC: Recent Labs    01/06/20 0242 01/07/20 0302 01/27/20 1903 01/28/20 0854  WBC 6.1 6.0 8.9 9.1  HGB 13.2 13.5 13.8 13.5  HCT 39.9 40.9 42.2 42.4  PLT 233 239 284 220    COAGS: Recent Labs    12/26/19 0814 01/05/20 0916  INR 1.0 1.0    BMP: Recent Labs    01/06/20 0242 01/07/20 0302 01/27/20 1903 01/28/20 0854  NA 138 139 143 139  K 4.3 4.5 3.6 3.3*    CL 108 104 109 108  CO2 GLUCOSE 95 89 106* 99  BUN CALCIUM 8.7* 9.0 9.1 8.6*  CREATININE 0.66 0.71 0.67 0.57  GFRNONAA >60 >60 >60 >60  GFRAA >60 >60 >60 >60    LIVER FUNCTION TESTS: Recent Labs    12/23/19 1329 01/28/20 0854  BILITOT 0.6 2.9*  AST 22 181*  ALT 11 151*  ALKPHOS 88 374*  PROT 7.4 5.9*  ALBUMIN 3.6 2.9*     Assessment and Plan:  T10 compression fracture. Plan for image-guided T10 KP/VP tentatively for tomorrow in IR with Dr. Corliss Skains pending scheduling. Patient will be NPO at midnight. Afebrile and WBCs WNL. Will hold Heparin per IR protocol. INR pending. UA reviewed, ok to proceed per Dr. Corliss Skains.  Risks and benefits of T10 kyphoplasty/vertebroplasty were discussed with the patient including, but not limited to education regarding the natural healing process of compression fractures without intervention, bleeding, infection, cement migration which may cause spinal cord damage, paralysis, pulmonary embolism or even death. This interventional procedure involves the use of X-rays and because of the nature of the planned procedure, it is possible that we will have prolonged use of X-ray fluoroscopy. Potential radiation risks to you include (but are not limited to) the following: - A slightly elevated risk for cancer  several years later in life. This risk is typically less than 0.5% percent. This risk is low in comparison to the normal incidence of human cancer, which is 33% for women and 50% for men according to the [employer]. - Radiation induced injury can include skin redness, resembling a rash, tissue breakdown / ulcers and hair loss (which can be temporary or permanent).  The likelihood of either of these occurring depends on the difficulty of the procedure and whether you are sensitive to radiation due to previous procedures, disease, or genetic conditions.  IF your procedure requires a prolonged use of radiation,  you will be notified and given written instructions for further action.  It is your responsibility to monitor the irradiated area for the 2 weeks following the procedure and to notify your physician if you are concerned that you have  suffered a radiation induced injury.   All of the patient's daughter's questions were answered, she is agreeable to proceed. Consent obtained by patient's daughter, Amy Weiss- signed and in chart.   Thank you for this interesting consult.  I greatly enjoyed meeting Amy Weiss and look forward to participating in their care.  A copy of this report was sent to the requesting provider on this date.  Electronically Signed: Elwin Mocha, PA-C 01/28/2020, 3:44 PM   I spent a total of 40 Minutes in face to face in clinical consultation, greater than 50% of which was counseling/coordinating care for T10 compression fracture/vertebral augmentation.

## 2020-01-28 NOTE — Telephone Encounter (Signed)
I noted that the patient is at the emergency room

## 2020-01-28 NOTE — Discharge Planning (Signed)
Daughter called back stating mom was NOT discharged from [facility]. RNCM and EDSW will research new findings.

## 2020-01-28 NOTE — ED Notes (Signed)
Pt uncooperative and slightly combative with blood draw and VS.  Daughter remains with pt.  Difficult to redirect.

## 2020-01-29 DIAGNOSIS — R945 Abnormal results of liver function studies: Secondary | ICD-10-CM

## 2020-01-29 DIAGNOSIS — R001 Bradycardia, unspecified: Secondary | ICD-10-CM

## 2020-01-29 DIAGNOSIS — I441 Atrioventricular block, second degree: Secondary | ICD-10-CM

## 2020-01-29 LAB — COMPREHENSIVE METABOLIC PANEL
ALT: 110 U/L — ABNORMAL HIGH (ref 0–44)
AST: 127 U/L — ABNORMAL HIGH (ref 15–41)
Albumin: 2.4 g/dL — ABNORMAL LOW (ref 3.5–5.0)
Alkaline Phosphatase: 383 U/L — ABNORMAL HIGH (ref 38–126)
Anion gap: 11 (ref 5–15)
BUN: 16 mg/dL (ref 8–23)
CO2: 22 mmol/L (ref 22–32)
Calcium: 8.7 mg/dL — ABNORMAL LOW (ref 8.9–10.3)
Chloride: 109 mmol/L (ref 98–111)
Creatinine, Ser: 0.6 mg/dL (ref 0.44–1.00)
GFR calc Af Amer: >60 mL/min (ref >60)
GFR calc non Af Amer: >60 mL/min (ref >60)
Glucose, Bld: 71 mg/dL (ref 70–99)
Potassium: 3.3 mmol/L — ABNORMAL LOW (ref 3.5–5.1)
Sodium: 142 mmol/L (ref 135–145)
Total Bilirubin: 2.6 mg/dL — ABNORMAL HIGH (ref 0.3–1.2)
Total Protein: 5.4 g/dL — ABNORMAL LOW (ref 6.5–8.1)

## 2020-01-29 LAB — PROTIME-INR
INR: 1 (ref 0.8–1.2)
Prothrombin Time: 13.2 seconds (ref 11.4–15.2)

## 2020-01-29 LAB — VITAMIN D 25 HYDROXY (VIT D DEFICIENCY, FRACTURES): Vit D, 25-Hydroxy: 47.8 ng/mL (ref 30–100)

## 2020-01-29 MED ORDER — IPRATROPIUM-ALBUTEROL 0.5-2.5 (3) MG/3ML IN SOLN: 3.0000 mL | RESPIRATORY_TRACT | Status: DC | PRN

## 2020-01-29 MED ORDER — POTASSIUM CHLORIDE CRYS ER 20 MEQ PO TBCR
20.0000 meq | EXTENDED_RELEASE_TABLET | Freq: Once | ORAL | Status: AC
  Administered 2020-01-29: 20 meq via ORAL
  Filled 2020-01-29: qty 1

## 2020-01-29 MED ORDER — POTASSIUM CHLORIDE 10 MEQ/100ML IV SOLN
10.0000 meq | INTRAVENOUS | Status: AC
  Administered 2020-01-29 (×2): 10 meq via INTRAVENOUS
  Filled 2020-01-29 (×2): qty 100

## 2020-01-29 MED ORDER — HYDROCODONE-ACETAMINOPHEN 5-325 MG PO TABS
1.0000 | ORAL_TABLET | Freq: Once | ORAL | Status: AC
  Administered 2020-01-29: 1 via ORAL
  Filled 2020-01-29: qty 1

## 2020-01-29 MED ORDER — SODIUM CHLORIDE 0.9 % IV SOLN: INTRAVENOUS | Status: AC

## 2020-01-29 NOTE — Progress Notes (Signed)
MD sent page and secure chat about patient requesting stronger pain medication. Awaiting response from MD

## 2020-01-29 NOTE — Progress Notes (Signed)
Due to emergent procedure, patient's case is rescheduled for tomorrow. Patient may eat today.  RN aware. Daughter updated.   NPO p MN tonight.  Loyce Dys, MS RD PA-C

## 2020-01-29 NOTE — Progress Notes (Signed)
PROGRESS NOTE    Amy Weiss  IFO:[id number] DOB: [birth year] DOA: 01/28/2020 PCP: [name], MD   Brief Narrative:  85 year old with history of cognitive impairment, bradycardia, multiple vertebral fractures status post kyphoplasty has been having recurrent back pain.  Recently was at [facility].  Left AMA now presenting back with more pain.  Found to have T10 compression fracture confirmed on MRI   Assessment & Plan:   Active Problems:   Atrioventricular block, Mobitz type 1, Wenckebach   Bradycardia   Closed wedge compression fracture of T10 vertebra (HCC)   Weight loss   Abnormal LFTs  Mid back pain secondary to acute T10 compression fracture -Plans for kyphoplasty tomorrow -Continue vitamin D and calcium -Pain control, bowel regimen -Check vitamin D levels  History of bradycardia with atrial fibrillation -Echocardiogram 2/21 showed EF 55 to 60%.  Supportive care.  Failure to thrive, adult Moderate to severe protein calorie malnutrition -Encourage oral intake.  Transaminitis, elevated total bilirubin -Tylenol levels negative, acute hepatitis panel-negative.  We will continue to trend levels -Lipase-14 -CT abdomen-mildly distended gallbladder but no evidence of cholecystitis or stones. -Spoke extensively with the daughter, does not want any further NSAID mastication at this time including MRI  Diabetes mellitus type 2 -Hemoglobin A1c 5.7, not on any home medication  Hypothyroidism -Continue Synthroid  Essential hypertension -Norvasc on hold  History of dementia -Supportive care.  Delirium protocol   DVT prophylaxis: (On Hold  Code Status: DNR/DNI Family Communication: Spoke with the patient's daughter over the phone Disposition Plan:   Patient From= home  Patient Anticipated D/C place= to be determined  Barriers= maintain hospital stay until kyphoplasty.  Unable to discharge her given the extent of her pain    Subjective: Patient reports  of back pain as expected and also generalized abdominal mild discomfort especially right upper quadrant.  Review of Systems Otherwise negative except as per HPI, including: General: Denies fever, chills, night sweats or unintended weight loss. Resp: Denies cough, wheezing, shortness of breath. Cardiac: Denies chest pain, palpitations, orthopnea, paroxysmal nocturnal dyspnea. GI: Denies  nausea, vomiting, diarrhea or constipation GU: Denies dysuria, frequency, hesitancy or incontinence MS: Back pain Neuro: Denies headache, neurologic deficits (focal weakness, numbness, tingling), abnormal gait Psych: Denies anxiety, depression, SI/HI/AVH Skin: Denies new rashes or lesions ID: Denies sick contacts, exotic exposures, travel  Examination:  General exam: Appears calm and comfortable, elderly frail Respiratory system: Clear to auscultation. Respiratory effort normal. Cardiovascular system: S1 & S2 heard, RRR. No JVD, murmurs, rubs, gallops or clicks. No pedal edema. Gastrointestinal system: Abdomen is nondistended, soft and nontender. No organomegaly or masses felt. Normal bowel sounds heard. Central nervous system: Alert and oriented. No focal neurological deficits. Extremities: Symmetric 5 x 5 power. Skin: No rashes, lesions or ulcers Psychiatry: Judgement and insight appear normal. Mood & affect appropriate.     Objective: Vitals:   01/28/20 1900 01/28/20 2124 01/28/20 2307 01/29/20 0420  BP: (!) 129/51 (!) 137/53 (!) 140/51 (!) 153/53  Pulse: (!) 50 (!) 45 (!) 40 (!) 34  Resp: 16 16 16 16   Temp: 98.1 F (36.7 C) 98.6 F (37 C) 98.7 F (37.1 C) 98.7 F (37.1 C)  TempSrc: Oral Oral Oral Oral  SpO2: 97% 100% 97% 97%    Intake/Output Summary (Last 24 hours) at 01/29/2020 03/30/2020 Last data filed at 01/28/2020 2200 Gross per 24 hour  Intake 1050 ml  Output --  Net 1050 ml   There were no vitals filed for this  visit.   Data Reviewed:   CBC: Recent Labs  Lab 01/27/20 1903  01/28/20 0854  WBC 8.9 9.1  NEUTROABS  --  6.4  HGB 13.8 13.5  HCT 42.2 42.4  MCV 92.1 95.3  PLT 284 220   Basic Metabolic Panel: Recent Labs  Lab 01/27/20 1903 01/28/20 0854 01/29/20 0447  NA 143 139 142  K 3.6 3.3* 3.3*  CL 109 108 109  CO2 23 23 22   GLUCOSE 106* 99 71  BUN 18 18 16   CREATININE 0.67 0.57 0.60  CALCIUM 9.1 8.6* 8.7*   GFR: Estimated Creatinine Clearance: 52.7 mL/min (by C-G formula based on SCr of 0.6 mg/dL). Liver Function Tests: Recent Labs  Lab 01/28/20 0854 01/29/20 0447  AST 181* 127*  ALT 151* 110*  ALKPHOS 374* 383*  BILITOT 2.9* 2.6*  PROT 5.9* 5.4*  ALBUMIN 2.9* 2.4*   Recent Labs  Lab 01/28/20 1301  LIPASE 14   No results for input(s): AMMONIA in the last 168 hours. Coagulation Profile: Recent Labs  Lab 01/29/20 0447  INR 1.0   Cardiac Enzymes: No results for input(s): CKTOTAL, CKMB, CKMBINDEX, TROPONINI in the last 168 hours. BNP (last 3 results) No results for input(s): PROBNP in the last 8760 hours. HbA1C: No results for input(s): HGBA1C in the last 72 hours. CBG: No results for input(s): GLUCAP in the last 168 hours. Lipid Profile: No results for input(s): CHOL, HDL, LDLCALC, TRIG, CHOLHDL, LDLDIRECT in the last 72 hours. Thyroid Function Tests: No results for input(s): TSH, T4TOTAL, FREET4, T3FREE, THYROIDAB in the last 72 hours. Anemia Panel: No results for input(s): VITAMINB12, FOLATE, FERRITIN, TIBC, IRON, RETICCTPCT in the last 72 hours. Sepsis Labs: No results for input(s): PROCALCITON, LATICACIDVEN in the last 168 hours.  Recent Results (from the past 240 hour(s))  SARS CORONAVIRUS 2 (TAT 6-24 HRS) Nasopharyngeal Nasopharyngeal Swab     Status: None   Collection Time: 01/28/20  1:28 PM   Specimen: Nasopharyngeal Swab  Result Value Ref Range Status   SARS Coronavirus 2 NEGATIVE NEGATIVE Final    Comment: (NOTE) SARS-CoV-2 target nucleic acids are NOT DETECTED. The SARS-CoV-2 RNA is generally detectable in  upper and lower respiratory specimens during the acute phase of infection. Negative results do not preclude SARS-CoV-2 infection, do not rule out co-infections with other pathogens, and should not be used as the sole basis for treatment or other patient management decisions. Negative results must be combined with clinical observations, patient history, and epidemiological information. The expected result is Negative. Fact Sheet for Patients: 03/30/20 Fact Sheet for Healthcare Providers: 03/29/20 This test is not yet approved or cleared by the [employer] FDA and  has been authorized for detection and/or diagnosis of SARS-CoV-2 by FDA under an Emergency Use Authorization (EUA). This EUA will remain  in effect (meaning this test can be used) for the duration of the COVID-19 declaration under Section 56 4(b)(1) of the Act, 21 U.S.C. section 360bbb-3(b)(1), unless the authorization is terminated or revoked sooner. Performed at [facility], [address], [place], [address] [place]          Radiology Studies: MR THORACIC SPINE WO CONTRAST  Result Date: 01/28/2020 CLINICAL DATA:  Recurrent back pain. History of compression fracture status post augmentation. EXAM: MRI THORACIC AND LUMBAR SPINE WITHOUT CONTRAST TECHNIQUE: Multiplanar and multiecho pulse sequences of the thoracic and lumbar spine were obtained without intravenous contrast. COMPARISON:  Lumbar spine MRI 01/03/2020 FINDINGS: MRI THORACIC SPINE FINDINGS Alignment:  Normal. Vertebrae:  T12 compression fracture status post augmentation with unchanged mild vertebral body height loss. T10 superior endplate compression fracture with 15% vertebral body height loss and mild marrow edema along a visible fracture line without retropulsion. No suspicious marrow lesion. Small hemangiomas in the T2 and T9 vertebral bodies. Cord:  Normal signal and morphology.  Paraspinal and other soft tissues: 1 cm T2 hyperintense focus in the upper pole of the left kidney, likely a cyst. Disc levels: Minimal disc bulging at T11-12 with slight posterior bowing of the T12 superior endplate. Scattered mild thoracic facet arthrosis. No spinal or neural foraminal stenosis. MRI LUMBAR SPINE FINDINGS Segmentation:  Standard. Alignment: Mild straightening of the normal lumbar lordosis with slight left convex curvature of the lumbar spine. No significant listhesis. Vertebrae: Previously augmented L1 compression fracture with unchanged vertebral body height loss. Interval augmentation of an L2 compression fracture with progressive height loss now measuring 40% without retropulsion. Persistent marrow edema throughout much of the L2 vertebral body with less pronounced edema at L1. Mild chronic posterior L5 vertebral body height loss. No new lumbar spine fracture. No suspicious osseous lesion. Conus medullaris and cauda equina: Conus extends to the upper L1 level. Conus and cauda equina appear normal. Paraspinal and other soft tissues: Small left upper pole renal cyst as noted above. Disc levels: L1-2: Minimal leftward disc bulging without stenosis, unchanged. L2-3: Minimal disc bulging eccentric to the left without stenosis, unchanged. L3-4: Minimal disc bulging and facet arthrosis without stenosis, unchanged. L4-5: Moderate disc space narrowing. Mild disc bulging and facet arthrosis without stenosis, unchanged. L5-S1: Mild disc space narrowing. Minimal disc bulging and spurring without stenosis, unchanged. IMPRESSION: 1. Acute T10 compression fracture with mild height loss. 2. T12-L2 compressions fracture status post augmentation. 3. No thoracic or lumbar spinal canal or neural foraminal stenosis. Electronically Signed   By: Logan Bores M.D.   On: 01/28/2020 12:39   MR LUMBAR SPINE WO CONTRAST  Result Date: 01/28/2020 CLINICAL DATA:  Recurrent back pain. History of compression fracture status  post augmentation. EXAM: MRI THORACIC AND LUMBAR SPINE WITHOUT CONTRAST TECHNIQUE: Multiplanar and multiecho pulse sequences of the thoracic and lumbar spine were obtained without intravenous contrast. COMPARISON:  Lumbar spine MRI 01/03/2020 FINDINGS: MRI THORACIC SPINE FINDINGS Alignment:  Normal. Vertebrae: T12 compression fracture status post augmentation with unchanged mild vertebral body height loss. T10 superior endplate compression fracture with 15% vertebral body height loss and mild marrow edema along a visible fracture line without retropulsion. No suspicious marrow lesion. Small hemangiomas in the T2 and T9 vertebral bodies. Cord:  Normal signal and morphology. Paraspinal and other soft tissues: 1 cm T2 hyperintense focus in the upper pole of the left kidney, likely a cyst. Disc levels: Minimal disc bulging at T11-12 with slight posterior bowing of the T12 superior endplate. Scattered mild thoracic facet arthrosis. No spinal or neural foraminal stenosis. MRI LUMBAR SPINE FINDINGS Segmentation:  Standard. Alignment: Mild straightening of the normal lumbar lordosis with slight left convex curvature of the lumbar spine. No significant listhesis. Vertebrae: Previously augmented L1 compression fracture with unchanged vertebral body height loss. Interval augmentation of an L2 compression fracture with progressive height loss now measuring 40% without retropulsion. Persistent marrow edema throughout much of the L2 vertebral body with less pronounced edema at L1. Mild chronic posterior L5 vertebral body height loss. No new lumbar spine fracture. No suspicious osseous lesion. Conus medullaris and cauda equina: Conus extends to the upper L1 level. Conus and cauda equina appear normal. Paraspinal and other soft  tissues: Small left upper pole renal cyst as noted above. Disc levels: L1-2: Minimal leftward disc bulging without stenosis, unchanged. L2-3: Minimal disc bulging eccentric to the left without stenosis,  unchanged. L3-4: Minimal disc bulging and facet arthrosis without stenosis, unchanged. L4-5: Moderate disc space narrowing. Mild disc bulging and facet arthrosis without stenosis, unchanged. L5-S1: Mild disc space narrowing. Minimal disc bulging and spurring without stenosis, unchanged. IMPRESSION: 1. Acute T10 compression fracture with mild height loss. 2. T12-L2 compressions fracture status post augmentation. 3. No thoracic or lumbar spinal canal or neural foraminal stenosis. Electronically Signed   By: Sebastian Ache M.D.   On: 01/28/2020 12:39   CT ABDOMEN PELVIS W CONTRAST  Result Date: 01/28/2020 CLINICAL DATA:  Abdominal distension EXAM: CT ABDOMEN AND PELVIS WITH CONTRAST TECHNIQUE: Multidetector CT imaging of the abdomen and pelvis was performed using the standard protocol following bolus administration of intravenous contrast. CONTRAST:  19mL OMNIPAQUE IOHEXOL 300 MG/ML  SOLN COMPARISON:  None. FINDINGS: Lower chest: Bibasilar scarring.  Mild cardiomegaly. Hepatobiliary: No focal liver lesion. Mildly distended gallbladder. No wall thickening or calculi. Mild central intrahepatic duct dilatation. The common bile duct tapers without evidence stone or mass. Pancreas: Atrophy.  Otherwise unremarkable. Spleen: Unremarkable. Adrenals/Urinary Tract: Adrenals are unremarkable. Too small to characterize hypoattenuating lesion of the upper pole of the left kidney. No hydronephrosis. Bladder is unremarkable. Stomach/Bowel: The stomach is within normal limits. Bowel is normal in caliber. Extensive distal colonic diverticulosis. Moderate stool burden. Appendix is not visualized. Vascular/Lymphatic: Aortic atherosclerosis. No enlarged abdominal or pelvic lymph nodes. Reproductive: Status post hysterectomy. No adnexal masses. Other: No ascites.  No free air. Musculoskeletal: Decreased osseous mineralization. Compression deformity of T12-L2 with segment augmentation. Multilevel degenerative changes. IMPRESSION: Mildly  distended gallbladder without additional evidence of acute cholecystitis. Mild central intrahepatic duct dilatation. Common bile duct tapers without stone or mass. Extensive colonic diverticulosis without evidence of diverticulitis. Aortic Atherosclerosis (ICD10-I70.0). Electronically Signed   By: Guadlupe Spanish M.D.   On: 01/28/2020 11:17        Scheduled Meds: . calcitonin (salmon)  1 spray Alternating Nares Daily  . calcium-vitamin D  1 tablet Oral BID  . diclofenac Sodium  2 g Topical QID  . levothyroxine  88 mcg Oral Q0600  . sodium chloride flush  3 mL Intravenous Q12H  . [START ON 01/31/2020] Vitamin D (Ergocalciferol)  50,000 Units Oral Q7 days   Continuous Infusions: . sodium chloride       LOS: 1 day   Time spent= 35 mins    Meggen Spaziani Joline Maxcy, MD [facility]  If 7PM-7AM, please contact night-coverage  01/29/2020, 8:28 AM

## 2020-01-29 NOTE — Progress Notes (Signed)
Patient c/o of potassium burning with IV infusion. Dr. Nelson Chimes notified, advised to administer PO potassium 20 mEq.

## 2020-01-29 NOTE — Progress Notes (Signed)
[facility]  Pt was suppose to be enrolled into the [facility] program on 01/28/20 at 1000 am. This is a home based palliative care program provided by [facility].  However this was cancelled due to her arriving at hospital. She would still be eligible upon discharge if she goes home. We do not provide Palliative Care services under [facility] at SNF.   Norm Parcel RN [phone]

## 2020-01-29 NOTE — Plan of Care (Signed)

## 2020-01-29 NOTE — Progress Notes (Signed)
Patient arrived in the unit at 2055 pm from [facility] in a stretcher, alert and confused, denied of pain, resting in a bed at this moment , and will continue to monitor closely.

## 2020-01-30 DIAGNOSIS — R634 Abnormal weight loss: Secondary | ICD-10-CM

## 2020-01-30 LAB — CBC
HCT: 35.5 % — ABNORMAL LOW (ref 36.0–46.0)
Hemoglobin: 11.6 g/dL — ABNORMAL LOW (ref 12.0–15.0)
MCH: 29.7 pg (ref 26.0–34.0)
MCHC: 32.7 g/dL (ref 30.0–36.0)
MCV: 91 fL (ref 80.0–100.0)
Platelets: 209 10*3/uL (ref 150–400)
RBC: 3.9 MIL/uL (ref 3.87–5.11)
RDW: 15.9 % — ABNORMAL HIGH (ref 11.5–15.5)
WBC: 5.5 10*3/uL (ref 4.0–10.5)
nRBC: 0 % (ref 0.0–0.2)

## 2020-01-30 LAB — PTH, INTACT AND CALCIUM
Calcium, Total (PTH): 8.7 mg/dL (ref 8.7–10.3)
PTH: UNDETERMINED pg/mL

## 2020-01-30 LAB — COMPREHENSIVE METABOLIC PANEL
ALT: 132 U/L — ABNORMAL HIGH (ref 0–44)
AST: 195 U/L — ABNORMAL HIGH (ref 15–41)
Albumin: 2.3 g/dL — ABNORMAL LOW (ref 3.5–5.0)
Alkaline Phosphatase: 463 U/L — ABNORMAL HIGH (ref 38–126)
Anion gap: 9 (ref 5–15)
BUN: 17 mg/dL (ref 8–23)
CO2: 22 mmol/L (ref 22–32)
Calcium: 8.8 mg/dL — ABNORMAL LOW (ref 8.9–10.3)
Chloride: 112 mmol/L — ABNORMAL HIGH (ref 98–111)
Creatinine, Ser: 0.57 mg/dL (ref 0.44–1.00)
GFR calc Af Amer: >60 mL/min (ref >60)
GFR calc non Af Amer: >60 mL/min (ref >60)
Glucose, Bld: 109 mg/dL — ABNORMAL HIGH (ref 70–99)
Potassium: 3.8 mmol/L (ref 3.5–5.1)
Sodium: 143 mmol/L (ref 135–145)
Total Bilirubin: 2.1 mg/dL — ABNORMAL HIGH (ref 0.3–1.2)
Total Protein: 5 g/dL — ABNORMAL LOW (ref 6.5–8.1)

## 2020-01-30 LAB — VITAMIN D 25 HYDROXY (VIT D DEFICIENCY, FRACTURES): Vit D, 25-Hydroxy: 49.84 ng/mL (ref 30–100)

## 2020-01-30 LAB — MAGNESIUM: Magnesium: 1.7 mg/dL (ref 1.7–2.4)

## 2020-01-30 MED ORDER — HYDROCODONE-ACETAMINOPHEN 5-325 MG PO TABS
1.0000 | ORAL_TABLET | Freq: Four times a day (QID) | ORAL | Status: DC | PRN
  Administered 2020-01-30 (×2): 2 via ORAL
  Administered 2020-01-31 – 2020-02-02 (×4): 1 via ORAL
  Administered 2020-02-02 – 2020-02-03 (×2): 2 via ORAL
  Filled 2020-01-30 (×4): qty 1
  Filled 2020-01-30 (×4): qty 2

## 2020-01-30 MED ORDER — ENSURE ENLIVE PO LIQD: 237.0000 mL | Freq: Three times a day (TID) | ORAL | Status: DC

## 2020-01-30 MED ORDER — ENSURE ENLIVE PO LIQD
237.0000 mL | Freq: Three times a day (TID) | ORAL | Status: DC
  Administered 2020-01-30 – 2020-02-03 (×7): 237 mL via ORAL
  Filled 2020-01-30 (×2): qty 237

## 2020-01-30 NOTE — Progress Notes (Signed)
Spoke with pt's daughter on a phone, she wants attending provider should prescribe stronger pain medication so that pt can have pain medication routinely after procedure. Will report to next shift nurse so that he/she can inform attending provider in a morning round.

## 2020-01-30 NOTE — Progress Notes (Signed)
NIR requested by Ronnie Doss, PA-C for possible image-guided T10 KP/VP.  Case has been reviewed and approved by Dr. Corliss Skains, was scheduled tentatively for today in IR. Unfortunately, there is an emergent code stroke procedure requiring Dr. Fatima Sanger immediate attention at this time- therefore, patient's case is rescheduled to Monday 02/02/2020 (this procedure does not occur on the weekends in IR department). Patient's diet restarted, patient will be NPO 02/02/2020 at 0001. Informed patient, RN, and daughter of above. All questions answered and concerns addressed.  Please call NIR with questions/concerns.   Waylan Boga Stryker Veasey, PA-C 01/30/2020, 12:21 PM

## 2020-01-30 NOTE — Progress Notes (Signed)
Initial Nutrition Assessment  DOCUMENTATION CODES:   Non-severe (moderate) malnutrition in context of acute illness/injury  INTERVENTION:   Recommend liberalizing diet to regular  Ensure Enlive po TID, each supplement provides 350 kcal and 20 grams of protein   NUTRITION DIAGNOSIS:   Moderate Malnutrition related to acute illness(T10 compression fracture) as evidenced by energy intake < 75% for > 7 days, mild muscle depletion, mild fat depletion.   GOAL:   Patient will meet greater than or equal to 90% of their needs   MONITOR:   PO intake, Supplement acceptance, I & O's, Weight trends, Labs  REASON FOR ASSESSMENT:   Malnutrition Screening Tool    ASSESSMENT:   Pt with a PMH signficiant for HLD, TIA, DM, hypothyroidism, cognitive impairment, multiple vertebral fractures s/p kyphoplasty has been having recurrent back pain.  Recently was at [facility]; pt left AMA and is now presenting back with more pain. Pt found to have T10 compression fracture.  Discussed pt with RN.   [name] on phone at time of visit. Pt and daughter report pt has had decreased appetite over the last 2 months due to pain. Her typical diet consists of fried eggs, toast with jelly, and bacon for breakfast. For dinner, she typically has either salmon, sweet potatoes, and broccoli, or a cheeseburger and a chocolate milkshake from [employer]. [name] states that the pt typically eats breakfast late which is why she doesn't have lunch.   Discussed importance of adequate protein intake with pt and informed pt that Ensure is often used to help patients meet their calorie and protein needs, especially when appetite is poor. Pt is agreeable to Ensure at this time. RD sent secure chat to MD requesting diet liberalization to regular to encourage adequate PO/calorie intake.   No PO intake documented.   Per pt's daughter, the pt typically weighs 137 lbs, but has lost 17 lbs in the past 2 months.  Weight readings in chart reflect only a 3 lb weight loss in 2 months.   Medications reviewed and include: Fortical, Oscal wit D, Drisdol Labs reviewed.   NUTRITION - FOCUSED PHYSICAL EXAM: Of note, nutrition-focused physical exam was limited due to pt complaining of pain.     Most Recent Value  Orbital Region  No depletion  Upper Arm Region  Mild depletion  Thoracic and Lumbar Region  Mild depletion  Buccal Region  No depletion  Temple Region  No depletion  Clavicle Bone Region  Mild depletion  Clavicle and Acromion Bone Region  Mild depletion  Scapular Bone Region  Mild depletion  Dorsal Hand  No depletion  Patellar Region  No depletion  Anterior Thigh Region  Mild depletion  Posterior Calf Region  Mild depletion  Edema (RD Assessment)  None  Hair  Reviewed  Eyes  Reviewed  Mouth  Reviewed  Skin  Reviewed  Nails  Reviewed       Diet Order:   Diet Order            Diet NPO time specified Except for: Sips with Meds  Diet effective midnight        Diet 2 gram sodium Room service appropriate? Yes; Fluid consistency: Thin  Diet effective now              EDUCATION NEEDS:   Not appropriate for education at this time  Skin:  Skin Assessment: Skin Integrity Issues: Skin Integrity Issues:: Incisions Incisions: back  Last BM:  PTA  Height:   Ht  Readings from Last 1 Encounters:  01/30/20 5\' 7"  (1.702 m)    Weight:   Wt Readings from Last 7 Encounters:  01/30/20 62 kg  01/27/20 62 kg  01/03/20 62.1 kg  01/02/20 62.1 kg  12/29/19 62.5 kg  11/25/19 63.5 kg  04/09/18 66.3 kg     BMI:  Body mass index is 21.41 kg/m.  Estimated Nutritional Needs:   Kcal:  1450-1650  Protein:  75-90 grams  Fluid:  >/= 1.45L    Larkin Ina, MS, RD, LDN RD pager number and weekend/on-call pager number located in [place].

## 2020-01-30 NOTE — Progress Notes (Signed)
PROGRESS NOTE    Amy Weiss  MGQ:[id number] DOB: 1934-12-15 DOA: 01/28/2020 PCP: [name], MD   Brief Narrative:  85 year old with history of cognitive impairment, bradycardia, multiple vertebral fractures status post kyphoplasty has been having recurrent back pain.  Recently was at [facility].  Left AMA now presenting back with more pain.  Found to have T10 compression fracture confirmed on MRI.  Awaiting kyphoplasty   Assessment & Plan:   Active Problems:   Atrioventricular block, Mobitz type 1, Wenckebach   Bradycardia   Closed wedge compression fracture of T10 vertebra (HCC)   Weight loss   Abnormal LFTs  Mid back pain secondary to acute T10 compression fracture -Plans of kyphoplasty per IR team.  Apparently they are delayed again today due to another case emergency -Continue vitamin D and calcium -Pain control, bowel regimen.  Vicodin added -Vitamin D levels within normal limits  History of bradycardia with atrial fibrillation -Echocardiogram 2/21 showed EF 55 to 60%.  Supportive care.  Failure to thrive, adult Moderate to severe protein calorie malnutrition -Encourage oral intake.  Transaminitis, elevated total bilirubin -Tylenol levels negative, acute hepatitis panel-negative.  We will continue to trend levels -Lipase-14 -CT abdomen-mildly distended gallbladder but no evidence of cholecystitis or stones. -Spoke extensively with the daughter, does not want any further NSAID mastication at this time including MRI  Diabetes mellitus type 2 -Hemoglobin A1c 5.7, not on any home medication  Hypothyroidism -Continue Synthroid  Essential hypertension -Norvasc on hold  History of dementia -Supportive care.  Delirium protocol   DVT prophylaxis: (On Hold  Code Status: DNR/DNI Family Communication: Updated about postponing her kyphoplasty by IR team Disposition Plan:   Patient From= home  Patient Anticipated D/C place= to be determined  Barriers=  maintain hospital stay until kyphoplasty.  Unable to discharge her given the extent of her pain    Subjective: Patient was n.p.o. overnight awaiting her kyphoplasty today but postponed due to another emergency.  She is pleasantly confused as she is repeatedly asking the same questions again and again.  Review of Systems Otherwise negative except as per HPI, including: General = no fevers, chills, dizziness,  fatigue HEENT/EYES = negative for loss of vision, double vision, blurred vision,  sore throa Cardiovascular= negative for chest pain, palpitation Respiratory/lungs= negative for shortness of breath, cough, wheezing; hemoptysis,  Gastrointestinal= negative for nausea, vomiting, abdominal pain Genitourinary= negative for Dysuria MSK = Negative for arthralgia, myalgias Neurology= Negative for headache, numbness, tingling  Psychiatry= Negative for suicidal and homocidal ideation Skin= Negative for Rash  Examination:  Constitutional: Not in acute distress, elderly frail Respiratory: Clear to auscultation bilaterally Cardiovascular: Normal sinus rhythm, no rubs Abdomen: Nontender nondistended good bowel sounds Musculoskeletal: No edema noted Skin: No rashes seen Neurologic: CN 2-12 grossly intact.  And nonfocal Psychiatric: Alert to name and place but poor judgment and insight.    Objective: Vitals:   01/30/20 0215 01/30/20 0359 01/30/20 0837 01/30/20 1225  BP:  135/67 (!) 166/53 139/89  Pulse:  (!) 42 (!) 41 (!) 41  Resp:  19 17 19   Temp:  97.8 F (36.6 C) 98.3 F (36.8 C) 98.4 F (36.9 C)  TempSrc:  Oral Oral Oral  SpO2:  97% 98% 98%  Weight: 62 kg     Height: 5\' 7"  (1.702 m)       Intake/Output Summary (Last 24 hours) at 01/30/2020 1231 Last data filed at 01/30/2020 0600 Gross per 24 hour  Intake 70 ml  Output 1 ml  Net 69 ml   Filed Weights   01/30/20 0215  Weight: 62 kg     Data Reviewed:   CBC: Recent Labs  Lab 01/27/20 1903 01/28/20 0854  01/30/20 0434  WBC 8.9 9.1 5.5  NEUTROABS  --  6.4  --   HGB 13.8 13.5 11.6*  HCT 42.2 42.4 35.5*  MCV 92.1 95.3 91.0  PLT 284 220 209   Basic Metabolic Panel: Recent Labs  Lab 01/27/20 1903 01/28/20 0854 01/29/20 0447 01/30/20 0434  NA 143 139 142 143  K 3.6 3.3* 3.3* 3.8  CL 109 108 109 112*  CO2 23 23 22 22   GLUCOSE 106* 99 71 109*  BUN 18 18 16 17   CREATININE 0.67 0.57 0.60 0.57  CALCIUM 9.1 8.6* 8.7* 8.8*  MG  --   --   --  1.7   GFR: Estimated Creatinine Clearance: 52.7 mL/min (by C-G formula based on SCr of 0.57 mg/dL). Liver Function Tests: Recent Labs  Lab 01/28/20 0854 01/29/20 0447 01/30/20 0434  AST 181* 127* 195*  ALT 151* 110* 132*  ALKPHOS 374* 383* 463*  BILITOT 2.9* 2.6* 2.1*  PROT 5.9* 5.4* 5.0*  ALBUMIN 2.9* 2.4* 2.3*   Recent Labs  Lab 01/28/20 1301  LIPASE 14   No results for input(s): AMMONIA in the last 168 hours. Coagulation Profile: Recent Labs  Lab 01/29/20 0447  INR 1.0   Cardiac Enzymes: No results for input(s): CKTOTAL, CKMB, CKMBINDEX, TROPONINI in the last 168 hours. BNP (last 3 results) No results for input(s): PROBNP in the last 8760 hours. HbA1C: No results for input(s): HGBA1C in the last 72 hours. CBG: No results for input(s): GLUCAP in the last 168 hours. Lipid Profile: No results for input(s): CHOL, HDL, LDLCALC, TRIG, CHOLHDL, LDLDIRECT in the last 72 hours. Thyroid Function Tests: No results for input(s): TSH, T4TOTAL, FREET4, T3FREE, THYROIDAB in the last 72 hours. Anemia Panel: No results for input(s): VITAMINB12, FOLATE, FERRITIN, TIBC, IRON, RETICCTPCT in the last 72 hours. Sepsis Labs: No results for input(s): PROCALCITON, LATICACIDVEN in the last 168 hours.  Recent Results (from the past 240 hour(s))  SARS CORONAVIRUS 2 (TAT 6-24 HRS) Nasopharyngeal Nasopharyngeal Swab     Status: None   Collection Time: 01/28/20  1:28 PM   Specimen: Nasopharyngeal Swab  Result Value Ref Range Status   SARS  Coronavirus 2 NEGATIVE NEGATIVE Final    Comment: (NOTE) SARS-CoV-2 target nucleic acids are NOT DETECTED. The SARS-CoV-2 RNA is generally detectable in upper and lower respiratory specimens during the acute phase of infection. Negative results do not preclude SARS-CoV-2 infection, do not rule out co-infections with other pathogens, and should not be used as the sole basis for treatment or other patient management decisions. Negative results must be combined with clinical observations, patient history, and epidemiological information. The expected result is Negative. Fact Sheet for Patients: 03/30/20 Fact Sheet for Healthcare Providers: 03/29/20 This test is not yet approved or cleared by the [place] FDA and  has been authorized for detection and/or diagnosis of SARS-CoV-2 by FDA under an Emergency Use Authorization (EUA). This EUA will remain  in effect (meaning this test can be used) for the duration of the COVID-19 declaration under Section 56 4(b)(1) of the Act, 21 U.S.C. section 360bbb-3(b)(1), unless the authorization is terminated or revoked sooner. Performed at [facility], [address], [place], [address] [place]          Radiology Studies: No results found.  Scheduled Meds: . calcitonin (salmon)  1 spray Alternating Nares Daily  . calcium-vitamin D  1 tablet Oral BID  . diclofenac Sodium  2 g Topical QID  . levothyroxine  88 mcg Oral Q0600  . sodium chloride flush  3 mL Intravenous Q12H  . [START ON 01/31/2020] Vitamin D (Ergocalciferol)  50,000 Units Oral Q7 days   Continuous Infusions: . sodium chloride       LOS: 2 days   Time spent= 35 mins    Deondray Ospina Arsenio Loader, MD [facility]  If 7PM-7AM, please contact night-coverage  01/30/2020, 12:31 PM

## 2020-01-31 DIAGNOSIS — E440 Moderate protein-calorie malnutrition: Secondary | ICD-10-CM | POA: Insufficient documentation

## 2020-01-31 DIAGNOSIS — S22070G Wedge compression fracture of T9-T10 vertebra, subsequent encounter for fracture with delayed healing: Secondary | ICD-10-CM

## 2020-01-31 LAB — COMPREHENSIVE METABOLIC PANEL
ALT: 104 U/L — ABNORMAL HIGH (ref 0–44)
AST: 121 U/L — ABNORMAL HIGH (ref 15–41)
Albumin: 2.1 g/dL — ABNORMAL LOW (ref 3.5–5.0)
Alkaline Phosphatase: 397 U/L — ABNORMAL HIGH (ref 38–126)
Anion gap: 6 (ref 5–15)
BUN: 14 mg/dL (ref 8–23)
CO2: 22 mmol/L (ref 22–32)
Calcium: 8.4 mg/dL — ABNORMAL LOW (ref 8.9–10.3)
Chloride: 116 mmol/L — ABNORMAL HIGH (ref 98–111)
Creatinine, Ser: 0.49 mg/dL (ref 0.44–1.00)
GFR calc Af Amer: >60 mL/min (ref >60)
GFR calc non Af Amer: >60 mL/min (ref >60)
Glucose, Bld: 96 mg/dL (ref 70–99)
Potassium: 3.7 mmol/L (ref 3.5–5.1)
Sodium: 144 mmol/L (ref 135–145)
Total Bilirubin: 1.4 mg/dL — ABNORMAL HIGH (ref 0.3–1.2)
Total Protein: 4.9 g/dL — ABNORMAL LOW (ref 6.5–8.1)

## 2020-01-31 LAB — CBC
HCT: 34.1 % — ABNORMAL LOW (ref 36.0–46.0)
Hemoglobin: 11.3 g/dL — ABNORMAL LOW (ref 12.0–15.0)
MCH: 31.2 pg (ref 26.0–34.0)
MCHC: 33.1 g/dL (ref 30.0–36.0)
MCV: 94.2 fL (ref 80.0–100.0)
Platelets: 198 10*3/uL (ref 150–400)
RBC: 3.62 MIL/uL — ABNORMAL LOW (ref 3.87–5.11)
RDW: 16.1 % — ABNORMAL HIGH (ref 11.5–15.5)
WBC: 5.6 10*3/uL (ref 4.0–10.5)
nRBC: 0 % (ref 0.0–0.2)

## 2020-01-31 LAB — MAGNESIUM: Magnesium: 1.6 mg/dL — ABNORMAL LOW (ref 1.7–2.4)

## 2020-01-31 MED ORDER — ENOXAPARIN SODIUM 40 MG/0.4ML ~~LOC~~ SOLN
40.0000 mg | SUBCUTANEOUS | Status: DC
  Administered 2020-01-31: 40 mg via SUBCUTANEOUS
  Filled 2020-01-31: qty 0.4

## 2020-01-31 MED ORDER — ALENDRONATE SODIUM 10 MG PO TABS: 70.0000 mg | ORAL_TABLET | ORAL | Status: DC

## 2020-01-31 MED ORDER — MAGNESIUM OXIDE 400 (241.3 MG) MG PO TABS
400.0000 mg | ORAL_TABLET | Freq: Two times a day (BID) | ORAL | Status: AC
  Administered 2020-01-31 – 2020-02-01 (×3): 400 mg via ORAL
  Filled 2020-01-31 (×3): qty 1

## 2020-01-31 NOTE — Progress Notes (Signed)
PROGRESS NOTE    Amy Weiss  JKK:[id number] DOB: Jun 03, 1935 DOA: 01/28/2020 PCP: Colon Branch, MD      Brief Narrative:  Amy Weiss is a 85 y.o. F with dementia, home dwelling, HTN, hypothyroidism, osteoporosis, recurrent vertebral fractures status post kyphoplasty who presents with back pain.  Patient first developed back pain beginning in February 2021, found to have new L1 compression fracture.  Neurosurgery directed TLSO brace and mobility, patient failed this and was admitted for kyphoplasty.  Discharged home, but readmitted 2 weeks later and L2 compression fracture.  Patient underwent kyphoplasty at L2 on 2/16.  Patient discharged to SNF, where she was for 3 weeks, and on the day of discharge, bent over and developed again back pain.  MRI spine confirmed T10 compression fracture, so the patient was admitted for kyphoplasty.       Assessment & Plan:  Vertebral compression fracture T10, acute -Continue calcitonin, vitamin D, calcium -Consult to IR for kyphoplasty Monday   Hypothyroidism -Continue levothyroxine  Dementia  Normocytic anemia Mild, no clinical bleeding  Hypomagnesemia -Supplement magnesium  Hypertension Blood pressure elevated -Resume amlodipine        Disposition: The patient was admitted with back pain.,  She is normally able to live at home, take care of all ADLs independently, but she is currently unable to move without severe excruciating pain. I will discharge when kyphoplasty is completed on Monday.        MDM: The below labs and imaging reports were reviewed and summarized above.  Medication management as above.   DVT prophylaxis: Lovenox Code Status: DNR Family Communication: Daughter Amy Weiss by phone    Consultants:   IR  Procedures:   3/10 CT abdomen and pelvis --no acute disease  3/10 MRI lumbar and thoracic spine --acute T10 compression fracture with mild height loss, no new T12-L2 compression fracture status  post augmentation  Kyphoplasty pending  Antimicrobials:      Culture data:              Subjective: Patient has had no change in her back pain.  She has had no new neurologic symptoms, no change in bowel or bladder function.  Objective: Vitals:   01/31/20 0428 01/31/20 0623 01/31/20 0722 01/31/20 1254  BP: (!) 154/60 (!) 184/74 (!) 157/72 (!) 154/93  Pulse: (!) 37 (!) 48 88 64  Resp: 20 18 18 18   Temp: 97.8 F (36.6 C) 98.5 F (36.9 C) 98 F (36.7 C) 98.2 F (36.8 C)  TempSrc: Oral Oral Oral Oral  SpO2: 97% 98% 97% 98%  Weight:      Height:        Intake/Output Summary (Last 24 hours) at 01/31/2020 1449 Last data filed at 01/31/2020 0538 Gross per 24 hour  Intake 108.33 ml  Output 450 ml  Net -341.67 ml   Filed Weights   01/30/20 0215  Weight: 62 kg    Examination: General appearance: Elderly thin adult female, alert and in no acute distress.  Lying in bed, talking on phone family. HEENT: Anicteric, conjunctiva pink, lids and lashes normal. No nasal deformity, discharge, epistaxis.  Lips moist.  Hearing normal, oropharynx moist without lesions. Skin: Warm and dry.  No jaundice.  No suspicious rashes or lesions. Cardiac: Normal rate, bigeminy noted, nl Z1-I9, soft systolic murmur.   Capillary refill is brisk JVP normal.  No LE edema.  Radial pulses 2+ and symmetric. Respiratory: Normal respiratory rate and rhythm.  CTAB without rales or wheezes. Abdomen: Abdomen  soft.  No TTP or guarding. No ascites, distension, hepatosplenomegaly.   MSK: No deformities or effusions.  Diffuse loss of subcutaneous muscle mass and fat. Neuro: Awake and alert.  EOMI, moves all extremities, pain with sitting up. Speech fluent.    Psych: Sensorium intact and responding to questions, attention normal. Affect pleasant.  Judgment and insight appear moderately impaired at baseline.    Data Reviewed: I have personally reviewed following labs and imaging studies:  CBC: Recent  Labs  Lab 02-26-2020 1903 01/28/20 0854 01/30/20 0434 01/31/20 0415  WBC 8.9 9.1 5.5 5.6  NEUTROABS  --  6.4  --   --   HGB 13.8 13.5 11.6* 11.3*  HCT 42.2 42.4 35.5* 34.1*  MCV 92.1 95.3 91.0 94.2  PLT 284 220 209 198   Basic Metabolic Panel: Recent Labs  Lab 2020-02-26 1903 26-Feb-2020 1903 01/28/20 0854 01/28/20 2207 01/29/20 0447 01/30/20 0434 01/31/20 0415  NA 143  --  139  --  142 143 144  K 3.6  --  3.3*  --  3.3* 3.8 3.7  CL 109  --  108  --  109 112* 116*  CO2 23  --  23  --  22 22 22   GLUCOSE 106*  --  99  --  71 109* 96  BUN 18  --  18  --  16 17 14   CREATININE 0.67  --  0.57  --  0.60 0.57 0.49  CALCIUM 9.1   < > 8.6* 8.7 8.7* 8.8* 8.4*  MG  --   --   --   --   --  1.7 1.6*   < > = values in this interval not displayed.   GFR: Estimated Creatinine Clearance: 52.7 mL/min (by C-G formula based on SCr of 0.49 mg/dL). Liver Function Tests: Recent Labs  Lab 01/28/20 0854 01/29/20 0447 01/30/20 0434 01/31/20 0415  AST 181* 127* 195* 121*  ALT 151* 110* 132* 104*  ALKPHOS 374* 383* 463* 397*  BILITOT 2.9* 2.6* 2.1* 1.4*  PROT 5.9* 5.4* 5.0* 4.9*  ALBUMIN 2.9* 2.4* 2.3* 2.1*   Recent Labs  Lab 01/28/20 1301  LIPASE 14   No results for input(s): AMMONIA in the last 168 hours. Coagulation Profile: Recent Labs  Lab 01/29/20 0447  INR 1.0   Cardiac Enzymes: No results for input(s): CKTOTAL, CKMB, CKMBINDEX, TROPONINI in the last 168 hours. BNP (last 3 results) No results for input(s): PROBNP in the last 8760 hours. HbA1C: No results for input(s): HGBA1C in the last 72 hours. CBG: No results for input(s): GLUCAP in the last 168 hours. Lipid Profile: No results for input(s): CHOL, HDL, LDLCALC, TRIG, CHOLHDL, LDLDIRECT in the last 72 hours. Thyroid Function Tests: No results for input(s): TSH, T4TOTAL, FREET4, T3FREE, THYROIDAB in the last 72 hours. Anemia Panel: No results for input(s): VITAMINB12, FOLATE, FERRITIN, TIBC, IRON, RETICCTPCT in the last  72 hours. Urine analysis:    Component Value Date/Time   COLORURINE AMBER (A) 01/28/2020 0759   APPEARANCEUR CLEAR 01/28/2020 0759   LABSPEC 1.021 01/28/2020 0759   PHURINE 5.0 01/28/2020 0759   GLUCOSEU NEGATIVE 01/28/2020 0759   GLUCOSEU NEGATIVE 10/13/2014 1615   HGBUR NEGATIVE 01/28/2020 0759   BILIRUBINUR NEGATIVE 01/28/2020 0759   KETONESUR 20 (A) 01/28/2020 0759   PROTEINUR 30 (A) 01/28/2020 0759   UROBILINOGEN 0.2 10/13/2014 1615   NITRITE NEGATIVE 01/28/2020 0759   LEUKOCYTESUR TRACE (A) 01/28/2020 0759   Sepsis Labs: @LABRCNTIP (procalcitonin:4,lacticacidven:4)  ) Recent Results (from the past  240 hour(s))  SARS CORONAVIRUS 2 (TAT 6-24 HRS) Nasopharyngeal Nasopharyngeal Swab     Status: None   Collection Time: 01/28/20  1:28 PM   Specimen: Nasopharyngeal Swab  Result Value Ref Range Status   SARS Coronavirus 2 NEGATIVE NEGATIVE Final    Comment: (NOTE) SARS-CoV-2 target nucleic acids are NOT DETECTED. The SARS-CoV-2 RNA is generally detectable in upper and lower respiratory specimens during the acute phase of infection. Negative results do not preclude SARS-CoV-2 infection, do not rule out co-infections with other pathogens, and should not be used as the sole basis for treatment or other patient management decisions. Negative results must be combined with clinical observations, patient history, and epidemiological information. The expected result is Negative. Fact Sheet for Patients: [employer] Fact Sheet for Healthcare Providers: quierodirigir.com This test is not yet approved or cleared by the [place] FDA and  has been authorized for detection and/or diagnosis of SARS-CoV-2 by FDA under an Emergency Use Authorization (EUA). This EUA will remain  in effect (meaning this test can be used) for the duration of the COVID-19 declaration under Section 56 4(b)(1) of the Act, 21 U.S.C. section  360bbb-3(b)(1), unless the authorization is terminated or revoked sooner. Performed at [facility], [address], [place], [place] [address]          Radiology Studies: No results found.      Scheduled Meds: . calcitonin (salmon)  1 spray Alternating Nares Daily  . calcium-vitamin D  1 tablet Oral BID  . diclofenac Sodium  2 g Topical QID  . feeding supplement (ENSURE ENLIVE)  237 mL Oral TID BM  . levothyroxine  88 mcg Oral Q0600  . sodium chloride flush  3 mL Intravenous Q12H  . Vitamin D (Ergocalciferol)  50,000 Units Oral Q7 days   Continuous Infusions: . sodium chloride 250 mL (01/30/20 1848)     LOS: 3 days    Time spent: 25 minutes    Alberteen Sam, MD [facility] 01/31/2020, 2:49 PM     Please page though AMION or Epic secure chat:  For [employer], Higher education careers adviser

## 2020-01-31 NOTE — Progress Notes (Signed)
PT Cancellation Note  Patient Details Name: Amy Weiss MRN: [id number] DOB: Jul 28, 1935   Cancelled Treatment:    Reason Eval/Treat Not Completed: Medical issues which prohibited therapy.  Pt noted to have pulse of 42bpm.  Also [name] wants pt to have TLSO prior to OOB.  Will wait on PT eval for TLSO to be delivered   Judson Roch 01/31/2020, 3:01 PM

## 2020-02-01 DIAGNOSIS — S22070A Wedge compression fracture of T9-T10 vertebra, initial encounter for closed fracture: Secondary | ICD-10-CM

## 2020-02-01 LAB — COMPREHENSIVE METABOLIC PANEL
ALT: 90 U/L — ABNORMAL HIGH (ref 0–44)
AST: 91 U/L — ABNORMAL HIGH (ref 15–41)
Albumin: 2.2 g/dL — ABNORMAL LOW (ref 3.5–5.0)
Alkaline Phosphatase: 409 U/L — ABNORMAL HIGH (ref 38–126)
Anion gap: 9 (ref 5–15)
BUN: 15 mg/dL (ref 8–23)
CO2: 23 mmol/L (ref 22–32)
Calcium: 8.5 mg/dL — ABNORMAL LOW (ref 8.9–10.3)
Chloride: 110 mmol/L (ref 98–111)
Creatinine, Ser: 0.52 mg/dL (ref 0.44–1.00)
GFR calc Af Amer: >60 mL/min (ref >60)
GFR calc non Af Amer: >60 mL/min (ref >60)
Glucose, Bld: 93 mg/dL (ref 70–99)
Potassium: 3.8 mmol/L (ref 3.5–5.1)
Sodium: 142 mmol/L (ref 135–145)
Total Bilirubin: 1.4 mg/dL — ABNORMAL HIGH (ref 0.3–1.2)
Total Protein: 5.1 g/dL — ABNORMAL LOW (ref 6.5–8.1)

## 2020-02-01 LAB — CBC
HCT: 34.6 % — ABNORMAL LOW (ref 36.0–46.0)
Hemoglobin: 11.6 g/dL — ABNORMAL LOW (ref 12.0–15.0)
MCH: 30.3 pg (ref 26.0–34.0)
MCHC: 33.5 g/dL (ref 30.0–36.0)
MCV: 90.3 fL (ref 80.0–100.0)
Platelets: 220 10*3/uL (ref 150–400)
RBC: 3.83 MIL/uL — ABNORMAL LOW (ref 3.87–5.11)
RDW: 16 % — ABNORMAL HIGH (ref 11.5–15.5)
WBC: 5.9 10*3/uL (ref 4.0–10.5)
nRBC: 0 % (ref 0.0–0.2)

## 2020-02-01 LAB — SURGICAL PCR SCREEN
MRSA, PCR: NEGATIVE
Staphylococcus aureus: NEGATIVE

## 2020-02-01 MED ORDER — ENOXAPARIN SODIUM 40 MG/0.4ML ~~LOC~~ SOLN
40.0000 mg | SUBCUTANEOUS | Status: DC
  Administered 2020-02-02: 40 mg via SUBCUTANEOUS
  Filled 2020-02-01: qty 0.4

## 2020-02-01 MED ORDER — CHLORHEXIDINE GLUCONATE CLOTH 2 % EX PADS
6.0000 | MEDICATED_PAD | Freq: Every day | CUTANEOUS | Status: DC
  Administered 2020-02-01 – 2020-02-03 (×2): 6 via TOPICAL

## 2020-02-01 NOTE — TOC Initial Note (Signed)
Transition of Care [facility]) - Initial/Assessment Note    Patient Details  Name: Amy Weiss MRN: [id number] Date of Birth: August 24, 1935  Transition of Care [facility]) CM/SW Contact:    Nonda Lou, LCSW Phone Number: 02/01/2020, 10:15 AM  Clinical Narrative:                 CSW received consult from MD for possible SNF placement at time of discharge. Patient is admitted from [facility] [place]. CSW contacted patient's daughter Amy Weiss and discussed the discharge plan of patient possibly returning to [facility]. CSW expressed there is a possibility patient may discharge tomorrow upon medical clearance. Patient's daughter expressed she would like patient to discharge to the facility upon discharge, but she does have concerns with the nutrition of patient while she is at the facility. CSW agreed to follow-up with facility to inquire on patient returning, as well as to inform facility of concerns from patient's daughter. Patient's daughter expressed she does not know how comfortable she would be with patient discharging following the procedure that will take place. CSW agreed to express concerns with doctor. No further questions expressed at this time.  CSW reached out to [facility] [phone] and spoke with a floor nurse. CSW informed admissions team is not available during the weekend and CSW will need to follow-up on Monday to discuss patient returning to facility. TOC team will continue to follow and assist with discharge planning needs.    Barriers to Discharge: Continued Medical Work up, ED Unsafe disposition   Patient Goals and CMS Choice Patient states their goals for this hospitalization and ongoing recovery are:: Get my back fixed again      Expected Discharge Plan and Services                                                Prior Living Arrangements/Services                       Activities of Daily Living Home Assistive Devices/Equipment: Dan Humphreys  (specify type) ADL Screening (condition at time of admission) Patient's cognitive ability adequate to safely complete daily activities?: No Is the patient deaf or have difficulty hearing?: No Does the patient have difficulty seeing, even when wearing glasses/contacts?: No Does the patient have difficulty concentrating, remembering, or making decisions?: Yes Patient able to express need for assistance with ADLs?: Yes Does the patient have difficulty dressing or bathing?: No Independently performs ADLs?: No Communication: Independent Dressing (OT): Independent Grooming: Independent Feeding: Independent Bathing: Needs assistance Is this a change from baseline?: Pre-admission baseline Toileting: Independent In/Out Bed: Independent Walks in Home: Needs assistance Is this a change from baseline?: Pre-admission baseline Does the patient have difficulty walking or climbing stairs?: Yes Weakness of Legs: None Weakness of Arms/Hands: None  Permission Sought/Granted                  Emotional Assessment              Admission diagnosis:  Transaminitis [R74.01] Closed wedge compression fracture of T10 vertebra (HCC) [S22.070A] Compression fracture of body of thoracic vertebra (HCC) [S22.000A] Compression fracture of T10 vertebra (HCC) [S22.070A] Patient Active Problem List   Diagnosis Date Noted  . Malnutrition of moderate degree 01/31/2020  . Closed wedge compression fracture of T10 vertebra (HCC) 01/28/2020  . Weight  loss 01/28/2020  . Abnormal LFTs 01/28/2020  . Weakness generalized   . DNR (do not resuscitate)   . Cognitive changes   . Senile dementia uncomp, without behavioral disturbance ([place]) 01/06/2020  . Hypothyroid 01/06/2020  . Back pain 01/02/2020  . Palliative care by specialist   . Encounter for hospice care discussion   . Bradycardia 12/24/2019  . Lumbar compression fracture, closed, initial encounter ([place]) 12/24/2019  . Lumbar compression fracture ([place])  12/23/2019  . Acute bilateral low back pain without sciatica 11/26/2019  . PCP NOTES >>>> 10/18/2015  . Atrioventricular block, Mobitz type 1, Wenckebach 12/10/2014  . Eczema 12/29/2011  . Annual physical exam 05/09/2011  . SKIN LESION 05/06/2010  . Hyperglycemia 11/19/2008  . TIA 11/19/2008  . Hyperlipidemia 03/04/2008  . FAMILIAL TREMOR 03/04/2008  . Arrhythmia--h/o AV reentry tachycardia 03/04/2008  . ARTHRALGIA 03/04/2008  . OSTEOPOROSIS 03/04/2008  . Closed compression fracture of L2 lumbar vertebra, initial encounter ([place]) 04/05/2007   PCP:  Colon Branch, MD Pharmacy:   [id number] - [place], [place] - [address] AT [address] [place] [address] Phone: [phone] Fax: [phone]     Social Determinants of Health (SDOH) Interventions    Readmission Risk Interventions No flowsheet data found.

## 2020-02-01 NOTE — Progress Notes (Signed)
PROGRESS NOTE    Amy Weiss  YQM:[id number] DOB: 1935/01/26 DOA: 01/28/2020 PCP: Amy Plump, MD      Brief Narrative:  Amy Weiss is a 85 y.o. F with dementia, home dwelling, HTN, hypothyroidism, osteoporosis, recurrent vertebral fractures status post kyphoplasty who presents with back pain.  Patient first developed back pain beginning in February 2021, found to have new L1 compression fracture.  Neurosurgery directed TLSO brace and mobility, patient failed this and was admitted for kyphoplasty.  Discharged home, but readmitted 2 weeks later and L2 compression fracture.  Patient underwent kyphoplasty at L2 on 2/16.  Patient discharged to SNF, where she was for 3 weeks, and on the day of discharge, bent over and developed again back pain.  MRI spine confirmed T10 compression fracture, so the patient was admitted for kyphoplasty.       Assessment & Plan:  Vertebral compression fracture T10, acute -Continue calcitonin, vitamin D, calcium -Start Alendronate -Consult to IR for kyphoplasty Monday   Hypothyroidism -Continue levothyroxine  Dementia  Normocytic anemia Mild, stable, no clinical bleeding  Hypomagnesemia -Continue PO magnesium  Hypertension Blood pressure mildly elevated -Continue amlodipine        Disposition: Patient admitted a third time for back pain, found to have AGAIN a new acute thoracic compression fracture.    Her pain is not well controlled with oral agents, and although she is normally able to live at home, take care of all ADLs independently, she is currently unable to move without severe excruciating pain.    Place TLSO brace, I will discharge when kyphoplasty is completed on Monday.        MDM: The below labs and imaging reports reviewed and summarized above.  Medication management as above.    DVT prophylaxis: Lovenox Code Status: DNR Family Communication: will call daughter Amy Weiss by phone    Consultants:    IR  Procedures:   3/10 CT abdomen and pelvis --no acute disease  3/10 MRI lumbar and thoracic spine --acute T10 compression fracture with mild height loss, no new T12-L2 compression fracture status post augmentation  Kyphoplasty pending  Antimicrobials:      Culture data:              Subjective: She has vague malaise today.  Not nausea or aches or fever, but malaise.  No change in back pain.  No new neuro symptoms, no change in bowel or bladder function.     Objective: Vitals:   01/31/20 1931 02/01/20 0005 02/01/20 0429 02/01/20 0803  BP: (!) 153/66 (!) 144/56 (!) 156/60 (!) 159/71  Pulse: 81 (!) 40 (!) 52 (!) 50  Resp: 18 18 18 18   Temp: 98 F (36.7 C) 97.7 F (36.5 C) 98.1 F (36.7 C) 98.1 F (36.7 C)  TempSrc: Oral Oral Oral Oral  SpO2: 98% 97% 96% 96%  Weight:      Height:        Intake/Output Summary (Last 24 hours) at 02/01/2020 0951 Last data filed at 02/01/2020 0442 Gross per 24 hour  Intake 103.67 ml  Output 500 ml  Net -396.33 ml   Filed Weights   01/30/20 0215  Weight: 62 kg    Examination: General appearance: elderly adult female, alert and in severe distress, but appears uncomfortable, debilitated.   HEENT: Anicteric, conjunctiva pink, lids and lashes normal. No nasal deformity, discharge, epistaxis.  Lips moist, OP tacky dry, no oral lesions.  Hearing normal. Skin: Warm and dry.  No suspicious rashes or  lesions. Cardiac: RRR, no murmurs appreciated.  No LE edema.    Respiratory: Normal respiratory rate and rhythm.  CTAB without rales or wheezes. Abdomen: Abdomen soft.  No TTP or guarding. No ascites, distension, hepatosplenomegaly.   MSK: No deformities or effusions of the large joints of the upper or lower extremities bilaterally. Neuro: Awake and alert. Memory impairment is evident.  Oriented to self, hospital.  Muscle tone normal, without fasciculations.  Moves upper extremities equally and with normal coordination.  Speech  fluent.    Psych: Sensorium intact and responding to questions, attention normal. Affect anxious.  Judgment and insight appear impaired.     Data Reviewed: I have personally reviewed following labs and imaging studies:  CBC: Recent Labs  Lab 01/27/20 1903 01/28/20 0854 01/30/20 0434 01/31/20 0415 02/01/20 0429  WBC 8.9 9.1 5.5 5.6 5.9  NEUTROABS  --  6.4  --   --   --   HGB 13.8 13.5 11.6* 11.3* 11.6*  HCT 42.2 42.4 35.5* 34.1* 34.6*  MCV 92.1 95.3 91.0 94.2 90.3  PLT 284 220 209 198 220   Basic Metabolic Panel: Recent Labs  Lab 01/28/20 0854 01/28/20 2207 01/29/20 0447 01/30/20 0434 01/31/20 0415 02/01/20 0429  NA 139  --  142 143 144 142  K 3.3*  --  3.3* 3.8 3.7 3.8  CL 108  --  109 112* 116* 110  CO2 23  --  22 22 22 23   GLUCOSE 99  --  71 109* 96 93  BUN 18  --  16 17 14 15   CREATININE 0.57  --  0.60 0.57 0.49 0.52  CALCIUM 8.6* 8.7 8.7* 8.8* 8.4* 8.5*  MG  --   --   --  1.7 1.6*  --    GFR: Estimated Creatinine Clearance: 52.7 mL/min (by C-G formula based on SCr of 0.52 mg/dL). Liver Function Tests: Recent Labs  Lab 01/28/20 0854 01/29/20 0447 01/30/20 0434 01/31/20 0415 02/01/20 0429  AST 181* 127* 195* 121* 91*  ALT 151* 110* 132* 104* 90*  ALKPHOS 374* 383* 463* 397* 409*  BILITOT 2.9* 2.6* 2.1* 1.4* 1.4*  PROT 5.9* 5.4* 5.0* 4.9* 5.1*  ALBUMIN 2.9* 2.4* 2.3* 2.1* 2.2*   Recent Labs  Lab 01/28/20 1301  LIPASE 14   No results for input(s): AMMONIA in the last 168 hours. Coagulation Profile: Recent Labs  Lab 01/29/20 0447  INR 1.0   Cardiac Enzymes: No results for input(s): CKTOTAL, CKMB, CKMBINDEX, TROPONINI in the last 168 hours. BNP (last 3 results) No results for input(s): PROBNP in the last 8760 hours. HbA1C: No results for input(s): HGBA1C in the last 72 hours. CBG: No results for input(s): GLUCAP in the last 168 hours. Lipid Profile: No results for input(s): CHOL, HDL, LDLCALC, TRIG, CHOLHDL, LDLDIRECT in the last 72  hours. Thyroid Function Tests: No results for input(s): TSH, T4TOTAL, FREET4, T3FREE, THYROIDAB in the last 72 hours. Anemia Panel: No results for input(s): VITAMINB12, FOLATE, FERRITIN, TIBC, IRON, RETICCTPCT in the last 72 hours. Urine analysis:    Component Value Date/Time   COLORURINE AMBER (A) 01/28/2020 0759   APPEARANCEUR CLEAR 01/28/2020 0759   LABSPEC 1.021 01/28/2020 0759   PHURINE 5.0 01/28/2020 0759   GLUCOSEU NEGATIVE 01/28/2020 0759   GLUCOSEU NEGATIVE 10/13/2014 1615   HGBUR NEGATIVE 01/28/2020 0759   BILIRUBINUR NEGATIVE 01/28/2020 0759   KETONESUR 20 (A) 01/28/2020 0759   PROTEINUR 30 (A) 01/28/2020 0759   UROBILINOGEN 0.2 10/13/2014 1615   NITRITE NEGATIVE 01/28/2020  [place] (A) 01/28/2020 0759   Sepsis Labs: @LABRCNTIP (procalcitonin:4,lacticacidven:4)  ) Recent Results (from the past 240 hour(s))  SARS CORONAVIRUS 2 (TAT 6-24 HRS) Nasopharyngeal Nasopharyngeal Swab     Status: None   Collection Time: 01/28/20  1:28 PM   Specimen: Nasopharyngeal Swab  Result Value Ref Range Status   SARS Coronavirus 2 NEGATIVE NEGATIVE Final    Comment: (NOTE) SARS-CoV-2 target nucleic acids are NOT DETECTED. The SARS-CoV-2 RNA is generally detectable in upper and lower respiratory specimens during the acute phase of infection. Negative results do not preclude SARS-CoV-2 infection, do not rule out co-infections with other pathogens, and should not be used as the sole basis for treatment or other patient management decisions. Negative results must be combined with clinical observations, patient history, and epidemiological information. The expected result is Negative. Fact Sheet for Patients: SugarRoll.be Fact Sheet for Healthcare Providers: https://www.woods-mathews.com/ This test is not yet approved or cleared by the [place] FDA and  has been authorized for detection and/or diagnosis of SARS-CoV-2 by FDA  under an Emergency Use Authorization (EUA). This EUA will remain  in effect (meaning this test can be used) for the duration of the COVID-19 declaration under Section 56 4(b)(1) of the Act, 21 U.S.C. section 360bbb-3(b)(1), unless the authorization is terminated or revoked sooner. Performed at [facility], [place] [address], [place], [place] [address]          Radiology Studies: No results found.      Scheduled Meds: . calcitonin (salmon)  1 spray Alternating Nares Daily  . calcium-vitamin D  1 tablet Oral BID  . Chlorhexidine Gluconate Cloth  6 each Topical Q0600  . diclofenac Sodium  2 g Topical QID  . enoxaparin (LOVENOX) injection  40 mg Subcutaneous Q24H  . feeding supplement (ENSURE ENLIVE)  237 mL Oral TID BM  . levothyroxine  88 mcg Oral Q0600  . sodium chloride flush  3 mL Intravenous Q12H  . Vitamin D (Ergocalciferol)  50,000 Units Oral Q7 days   Continuous Infusions: . sodium chloride 250 mL (01/30/20 1848)     LOS: 4 days    Time spent: 25 minutes    Edwin Dada, MD [facility] 02/01/2020, 9:51 AM     Please page though [place] or Epic secure chat:  For [employer], Adult nurse

## 2020-02-01 NOTE — Plan of Care (Signed)

## 2020-02-01 NOTE — Evaluation (Signed)
Physical Therapy Evaluation Patient Details Name: Amy Weiss MRN: [id number] DOB: [birth year] Today's Date: 02/01/2020   History of Present Illness  Amy Weiss is a 85 y.o. F with dementia, home dwelling, HTN, hypothyroidism, osteoporosis, recurrent vertebral fractures status post kyphoplasty who presents with back pain.  Pt has had L1, L2 and now T10 compression fractures over last 3 weeks.  she is planned for kyphoplasty 02-02-20.  I spoke to Amy Weiss who emphasized not making pt hurt too much when moving with therapy - famiy concerned about this..    Clinical Impression  Pt agreeable to OOB to chair. She is incontinent of urine with mobilty. She sat in chair until I changed her bed and then I helped her back to bed.  Pts max pain today was 5/10. She felt better after moving around. Emphasis and repetition on back precautions - esp log rolling and looking up with sit to stands. She should do well but will need to return SNF level care for therapy to get stronger/less pain prior to home.  Pt should progress nicely with skilled PT services.    Follow Up Recommendations SNF;Supervision/Assistance - 24 hour    Equipment Recommendations  None recommended by PT    Recommendations for Other Services       Precautions / Restrictions Precautions Precautions: Fall;Back Precaution Comments: cueing needed for log roll technique, cues for no bending during standing and sitting - keeping her eyes looking up Spinal Brace: Thoracolumbosacral orthotic(pt has used TLSO in the past.  Amy Weiss pt OOB without brace at this time with good technque per family request) Restrictions Weight Bearing Restrictions: No Other Position/Activity Restrictions: I reviewed back precautions several times with pt and talked about need of reachers etc.  Pt incontinent of urine with movement      Mobility  Bed Mobility Overal bed mobility: Needs Assistance Bed Mobility: Rolling;Sidelying to Sit;Sit to  Sidelying Rolling: Min assist Sidelying to sit: Min assist     Sit to sidelying: Min assist General bed mobility comments: Pt needed max cues on log rolling and keeping good alignment  Transfers Overall transfer level: Needs assistance Equipment used: Rolling walker (2 wheeled) Transfers: Sit to/from Stand Sit to Stand: Min assist Stand pivot transfers: Min assist       General transfer comment: pt needed cues for technque for standing up and sitting down.  reminded pt to look up while doing both.  Pt anxious about being up on her feet  Ambulation/Gait             General Gait Details: pt did transfers only today  Stairs            Wheelchair Mobility    Modified Rankin (Stroke Patients Only)       Balance   Sitting-balance support: Bilateral upper extremity supported Sitting balance-Leahy Scale: Good         Standing balance comment: in sitting and standing - pt used B UE support to help take pressure off her back and keep her erect                             Pertinent Vitals/Pain Pain Assessment: 0-10 Pain Score: 5  Pain Location: back Pain Descriptors / Indicators: Discomfort;Sore;Grimacing Pain Intervention(s): Monitored during session    Home Living Family/patient expects to be discharged to:: Skilled nursing facility  Additional Comments: Pt came from SNF (But she doesnt remember or agree that she did) and family plans her to return to SNF to get stronger prior to home.  Pt was independent and living alone wtih frequent family support in january    Prior Function                 Hand Dominance        Extremity/Trunk Assessment        Lower Extremity Assessment Lower Extremity Assessment: Generalized weakness    Cervical / Trunk Assessment Cervical / Trunk Exceptions: Prev L1 compression fx, new L2 compression fx, kyphoplasties for both.  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 she doesnt remember coming from SNF care.  Pt said seh felt better after moving      General Comments General comments (skin integrity, edema, etc.): pt felt better after moving around.  I changed her sheets while up.  pt incontinent of urine wtih mobilty    Exercises     Assessment/Plan    PT Assessment Patient needs continued PT services  PT Problem List Decreased strength;Decreased activity tolerance;Decreased balance;Decreased mobility;Decreased knowledge of use of DME;Decreased safety awareness;Decreased knowledge of precautions;Pain;Decreased cognition;Decreased coordination       PT Treatment Interventions DME instruction;Gait training;Stair training;Therapeutic activities;Functional mobility training;Balance training;Therapeutic exercise;Neuromuscular re-education;Patient/family education;Cognitive remediation    PT Goals (Current goals can be found in the Care Plan section)  Acute Rehab PT Goals Patient Stated Goal: pt wants to stop hurting and go home and sit in her swing PT Goal Formulation: With patient Time For Goal Achievement: 02/15/20 Potential to Achieve Goals: Fair    Frequency Min 3X/week   Barriers to discharge Decreased caregiver support      Co-evaluation               AM-PAC PT "6 Clicks" Mobility  Outcome Measure Help needed turning from your back to your side while in a flat bed without using bedrails?: A Little Help needed moving from lying on your back to sitting on the side of  a flat bed without using bedrails?: A Little Help needed moving to and from a bed to a chair (including a wheelchair)?: A Little Help needed standing up from a chair using your arms (e.g., wheelchair or bedside chair)?: A Little Help needed to walk in hospital room?: A Lot Help needed climbing 3-5 steps with a railing? : A Lot 6 Click Score: 16    End of Session   Activity Tolerance: Patient tolerated treatment well Patient left: in bed;with call bell/phone within reach;with bed alarm set Nurse Communication: Mobility status PT Visit Diagnosis: Other abnormalities of gait and mobility (R26.89);Pain;Muscle weakness (generalized) (M62.81)    Time: 2119-4174 PT Time Calculation (min) (ACUTE ONLY): 45 min   Charges:   PT Evaluation $PT Eval Low Complexity: 1 Low PT Treatments $Therapeutic Activity: 23-37 mins        02/01/2020   Ranae Palms, PT   Judson Roch 02/01/2020, 2:00 PM

## 2020-02-02 ENCOUNTER — Inpatient Hospital Stay (HOSPITAL_COMMUNITY): Payer: Medicare HMO

## 2020-02-02 ENCOUNTER — Telehealth: Payer: Self-pay

## 2020-02-02 HISTORY — PX: IR KYPHO THORACIC WITH BONE BIOPSY: IMG5518

## 2020-02-02 MED ORDER — GELATIN ABSORBABLE 12-7 MM EX MISC
CUTANEOUS | Status: AC
  Filled 2020-02-02: qty 1

## 2020-02-02 MED ORDER — FENTANYL CITRATE (PF) 100 MCG/2ML IJ SOLN
INTRAMUSCULAR | Status: AC
  Filled 2020-02-02: qty 4

## 2020-02-02 MED ORDER — CEFAZOLIN SODIUM-DEXTROSE 2-4 GM/100ML-% IV SOLN
2.0000 g | INTRAVENOUS | Status: AC
  Administered 2020-02-02: 2 g via INTRAVENOUS
  Filled 2020-02-02: qty 100

## 2020-02-02 MED ORDER — MIDAZOLAM HCL 2 MG/2ML IJ SOLN
INTRAMUSCULAR | Status: AC
  Filled 2020-02-02: qty 4

## 2020-02-02 MED ORDER — TOBRAMYCIN SULFATE 1.2 G IJ SOLR
INTRAMUSCULAR | Status: AC | PRN
  Administered 2020-02-02: .01 g via TOPICAL

## 2020-02-02 MED ORDER — BUPIVACAINE HCL (PF) 0.5 % IJ SOLN
INTRAMUSCULAR | Status: AC | PRN
  Administered 2020-02-02: 20 mL

## 2020-02-02 MED ORDER — SODIUM CHLORIDE 0.9 % IV SOLN: INTRAVENOUS | Status: AC

## 2020-02-02 MED ORDER — FENTANYL CITRATE (PF) 100 MCG/2ML IJ SOLN
INTRAMUSCULAR | Status: AC | PRN
  Administered 2020-02-02 (×3): 12.5 ug via INTRAVENOUS

## 2020-02-02 MED ORDER — BUPIVACAINE HCL (PF) 0.5 % IJ SOLN
INTRAMUSCULAR | Status: AC
  Filled 2020-02-02: qty 30

## 2020-02-02 MED ORDER — MIDAZOLAM HCL 2 MG/2ML IJ SOLN
INTRAMUSCULAR | Status: AC | PRN
  Administered 2020-02-02 (×2): 0.5 mg via INTRAVENOUS

## 2020-02-02 MED ORDER — CEFAZOLIN SODIUM-DEXTROSE 2-4 GM/100ML-% IV SOLN
INTRAVENOUS | Status: AC
  Filled 2020-02-02: qty 100

## 2020-02-02 MED ORDER — TOBRAMYCIN SULFATE 1.2 G IJ SOLR
INTRAMUSCULAR | Status: AC
  Filled 2020-02-02: qty 1.2

## 2020-02-02 MED ORDER — IOHEXOL 300 MG/ML  SOLN
50.0000 mL | Freq: Once | INTRAMUSCULAR | Status: AC | PRN
  Administered 2020-02-02: 10 mL

## 2020-02-02 NOTE — Discharge Instructions (Signed)
1.No stooping,or bending or lifting more than 10 lbs for 2 weeks. 2.Use walker to ambulate. No driving for 2 weeks. 4.RTC PRN  2 weeks

## 2020-02-02 NOTE — Progress Notes (Signed)
Physical Therapy Treatment Patient Details Name: Amy Weiss MRN: [id number] DOB: September 02, 1935 Today's Date: 02/02/2020    History of Present Illness Amy Weiss is a 85 y.o. F with dementia, home dwelling, HTN, hypothyroidism, osteoporosis, recurrent vertebral fractures status post kyphoplasty who presents with back pain.  Pt has had L1, L2 and now T10 compression fractures over last 3 weeks.  she is planned for kyphoplasty 02-02-20.  I spoke to Amy Weiss who emphasized not making pt hurt too much when moving with therapy - famiy concerned about this..    PT Comments    Pt in bed with daughter present upon arrival of PT, agreeable to PT session despite concerns about pain with mobility. The pt continues to present with limitations in functional mobility, ROM, endurance, and dynamic stability compared to their prior level of function and independence due to above dx. The pt's course is further complicated by cognitive deficits that impair safety awareness and pt the pt at increased risk for breaking spinal precautions. The pt was able to ambulate around in the hallway with minG and RW for safety, and had no LOB during mobility today. The pt will continue to benefit from skilled PT to maximize safety with mobility and maintenance of spinal precautions independently.     Follow Up Recommendations  SNF;Supervision/Assistance - 24 hour     Equipment Recommendations  None recommended by PT    Recommendations for Other Services       Precautions / Restrictions Precautions Precautions: Fall;Back Precaution Booklet Issued: Yes (comment) Precaution Comments: cueing needed for log roll technique, cues for no bending during standing and sitting - keeping her eyes looking up Required Braces or Orthoses: (No brace ordered for current admission) Spinal Brace: Thoracolumbosacral orthotic(pt has used TLSO in the past. Amy Weiss pt OOB without brace at this time with good technque per family  request) Restrictions Weight Bearing Restrictions: No Other Position/Activity Restrictions: I reviewed back precautions several times with pt and talked about need of reachers etc.  pt with noted deficits in STM, unsure of pt's rtention of precautions    Mobility  Bed Mobility Overal bed mobility: Needs Assistance Bed Mobility: Rolling;Sidelying to Sit;Sit to Sidelying Rolling: Min assist Sidelying to sit: Min assist     Sit to sidelying: Min assist General bed mobility comments: Pt needed max cues on log rolling and keeping good alignment  Transfers Overall transfer level: Needs assistance Equipment used: Rolling walker (2 wheeled) Transfers: Sit to/from Stand Sit to Stand: Min guard         General transfer comment: pt needed cues for technque for standing up and sitting down.  reminded pt to look up while doing both.  Pt anxious about being up on her feet  Ambulation/Gait Ambulation/Gait assistance: Min guard Gait Distance (Feet): 150 Feet Assistive device: Rolling walker (2 wheeled) Gait Pattern/deviations: Step-through pattern;Decreased stride length Gait velocity: fair Gait velocity interpretation: 1.31 - 2.62 ft/sec, indicative of limited community ambulator General Gait Details: pt reports pain relief with ambulation, needs multipe cues for positioning and spinal precautions with turning   Stairs             Wheelchair Mobility    Modified Rankin (Stroke Patients Only)       Balance Overall balance assessment: Needs assistance Sitting-balance support: No upper extremity supported;Feet supported Sitting balance-Leahy Scale: Good     Standing balance support: Bilateral upper extremity supported;During functional activity Standing balance-Leahy Scale: Poor Standing balance comment: in sitting and standing -  pt used B UE support to help take pressure off her back and keep her erect                            Cognition Arousal/Alertness:  Awake/alert Behavior During Therapy: WFL for tasks assessed/performed Overall Cognitive Status: History of cognitive impairments - at baseline Area of Impairment: Memory;Safety/judgement                     Memory: Decreased short-term memory;Decreased recall of precautions   Safety/Judgement: Decreased awareness of deficits;Decreased awareness of safety   Problem Solving: Difficulty sequencing;Requires verbal cues General Comments: Pt restless and frustrated about having to move but agreeable, asking similar questions multiple times throughout session, sometimes immediately following the answer. Pt reports moving makes her back felt better and was no longer moaning or grimacing, but continued reporting 8/10 pain with ambulation.      Exercises      General Comments General comments (skin integrity, edema, etc.): pt reports she has not used TLSO in "years" and does not want to use one      Pertinent Vitals/Pain Pain Assessment: 0-10 Pain Score: 8  Pain Location: pt reports 3/10 pain in back supine in bed, 8/10 with mobility. Pain Descriptors / Indicators: Discomfort;Sore;Grimacing Pain Intervention(s): Limited activity within patient's tolerance;Monitored during session;Repositioned    Home Living                      Prior Function            PT Goals (current goals can now be found in the care plan section) Acute Rehab PT Goals Patient Stated Goal: pt wants to stop hurting and go home and sit in her swing PT Goal Formulation: With patient Time For Goal Achievement: 02/15/20 Potential to Achieve Goals: Fair Progress towards PT goals: Progressing toward goals    Frequency    Min 3X/week      PT Plan Current plan remains appropriate    Co-evaluation              AM-PAC PT "6 Clicks" Mobility   Outcome Measure  Help needed turning from your back to your side while in a flat bed without using bedrails?: A Little Help needed moving from  lying on your back to sitting on the side of a flat bed without using bedrails?: A Little Help needed moving to and from a bed to a chair (including a wheelchair)?: A Little Help needed standing up from a chair using your arms (e.g., wheelchair or bedside chair)?: A Little Help needed to walk in hospital room?: A Little Help needed climbing 3-5 steps with a railing? : A Lot 6 Click Score: 17    End of Session Equipment Utilized During Treatment: Gait belt Activity Tolerance: Patient tolerated treatment well Patient left: in bed;with call bell/phone within reach;with bed alarm set;with family/visitor present Nurse Communication: Mobility status(pt need for purewick replacement) PT Visit Diagnosis: Other abnormalities of gait and mobility (R26.89);Pain;Muscle weakness (generalized) (M62.81) Pain - Right/Left: (back) Pain - part of body: (back)     Time: 6283-6629 PT Time Calculation (min) (ACUTE ONLY): 24 min  Charges:  $Gait Training: 8-22 mins $Self Care/Home Management: 8-22                     Rolm Baptise, PT, DPT   Acute Rehabilitation Department Pager #: [phone]  Otho Bellows 02/02/2020, 4:51 PM

## 2020-02-02 NOTE — Telephone Encounter (Signed)
Received orders/plan of care from [facility]. Form signed and mailed back in envelope provided. Copy of forms sent for scanning.

## 2020-02-02 NOTE — Consult Note (Signed)
   [facility] Inpatient Consult   02/02/2020  Maguire ROLA LENNON [birth year] [id number]   Patient screened for high risk score for unplanned readmission and for a readmission less than 30 days of  Hospitalization, patient is admitting from a skilled nursing facility, [facility].  Patient is with [facility].  Primary Care Provider [PCP]: Dr. Kathlene November with [facility].   Plan:  Currently, needs will be met at a skilled level of care. No follow up needed with [facility] at this time.  For questions contact:   Natividad Brood, RN BSN [facility] Liaison  [phone] business mobile phone Toll free office [phone]  Fax number: [phone] [email or web address]

## 2020-02-02 NOTE — Procedures (Signed)
S/P T 10 balloon KP. S.Krishawna Stiefel MD

## 2020-02-02 NOTE — Plan of Care (Signed)
Mod assist with adls 

## 2020-02-02 NOTE — Progress Notes (Signed)
PROGRESS NOTE    Amy Weiss  TDV:[id number] DOB: May 22, 1935 DOA: 01/28/2020 PCP: [name], MD      Brief Narrative:  Amy Weiss is a 85 y.o. F with dementia, home dwelling, HTN, hypothyroidism, osteoporosis, recurrent vertebral fractures status post kyphoplasty who presents with back pain.  Patient first developed back pain beginning in February 2021, found to have new L1 compression fracture.  Neurosurgery directed TLSO brace and mobility, patient failed this and was admitted for kyphoplasty.  Discharged home, but readmitted 2 weeks later and L2 compression fracture.  Patient underwent kyphoplasty at L2 on 2/16.  Patient discharged to SNF, where she was for 3 weeks, and on the day of discharge, bent over and developed again back pain.  MRI spine confirmed T10 compression fracture, so the patient was admitted for kyphoplasty.       Assessment & Plan:  Vertebral compression fracture T10, acute S/p kyphoplasty T10 by Dr. Corliss Skains -Continue calcitonin, vitamin D, calcium -Continue Alendronate -Consult to IR for kyphoplasty Monday   Hypothyroidism -Continue levothyroxine  Dementia  Normocytic anemia Mild, stable, no clinical bleeding  Hypomagnesemia -Continue PO magnesium  Hypertension Blood pressure mildly elevated -Continue amlodipine        Disposition: Patient admitted a third time for back pain, found to have AGAIN a new acute thoracic compression fracture.    Her pain is not well controlled with oral agents, and although she is normally able to live at home, take care of all ADLs independently, she is currently unable to move without severe excruciating pain.    Place TLSO brace, I will discharge when kyphoplasty is completed on Monday.        MDM: The below labs and imaging reports reviewed and summarized above.  Medication management as above.    DVT prophylaxis: Lovenox Code Status: DNR Family Communication: daughter robbin at bedside    Consultants:   IR  Procedures:   3/10 CT abdomen and pelvis --no acute disease  3/10 MRI lumbar and thoracic spine --acute T10 compression fracture with mild height loss, no new T12-L2 compression fracture status post augmentation  Kyphoplasty 3/15  Antimicrobials:      Culture data:              Subjective: Back pain is improved.  No confusion, hand tingling, like tingling.     Objective: Vitals:   02/02/20 1111 02/02/20 1115 02/02/20 1236 02/02/20 1627  BP: (!) 147/69 (!) 118/53 (!) 126/57 (!) 133/59  Pulse: (!) 41 (!) 41 (!) 32 (!) 41  Resp: 17 18 18 16   Temp:   98.2 F (36.8 C) 98.1 F (36.7 C)  TempSrc:   Oral Oral  SpO2: 97% 97% 100% 98%  Weight:      Height:        Intake/Output Summary (Last 24 hours) at 02/02/2020 1754 Last data filed at 02/02/2020 1100 Gross per 24 hour  Intake -  Output 750 ml  Net -750 ml   Filed Weights   01/30/20 0215  Weight: 62 kg    Examination: General appearance: Elderly adult female, alert and in no acute distress.  In bed.  Cardiac: RRR, no murmurs appreciated.  No LE edema.    Respiratory: Normal respiratory rate and rhythm.  CTAB without rales or wheezes. Abdomen: Abdomen soft.  No tenderness palpation or guarding. No ascites, distension, hepatosplenomegaly.   Neuro: Awake and alert. Naming is grossly intact as recall seems moderately impaired.  Muscle tone is slightly diminished, moves  upper extremities with normal strength and coordination.  Speech fluent.  Psych: Sensorium intact and responding to questions, attention normal. Affect pleasant.  Judgment and insight appear impaired by dementia .       Data Reviewed: I have personally reviewed following labs and imaging studies:  CBC: Recent Labs  Lab 01/27/20 1903 01/28/20 0854 01/30/20 0434 01/31/20 0415 02/01/20 0429  WBC 8.9 9.1 5.5 5.6 5.9  NEUTROABS  --  6.4  --   --   --   HGB 13.8 13.5 11.6* 11.3* 11.6*  HCT 42.2 42.4 35.5* 34.1*  34.6*  MCV 92.1 95.3 91.0 94.2 90.3  PLT 284 220 209 198 220   Basic Metabolic Panel: Recent Labs  Lab 01/28/20 0854 01/28/20 2207 01/29/20 0447 01/30/20 0434 01/31/20 0415 02/01/20 0429  NA 139  --  142 143 144 142  K 3.3*  --  3.3* 3.8 3.7 3.8  CL 108  --  109 112* 116* 110  CO2 23  --  22 22 22 23   GLUCOSE 99  --  71 109* 96 93  BUN 18  --  16 17 14 15   CREATININE 0.57  --  0.60 0.57 0.49 0.52  CALCIUM 8.6* 8.7 8.7* 8.8* 8.4* 8.5*  MG  --   --   --  1.7 1.6*  --    GFR: Estimated Creatinine Clearance: 52.7 mL/min (by C-G formula based on SCr of 0.52 mg/dL). Liver Function Tests: Recent Labs  Lab 01/28/20 0854 01/29/20 0447 01/30/20 0434 01/31/20 0415 02/01/20 0429  AST 181* 127* 195* 121* 91*  ALT 151* 110* 132* 104* 90*  ALKPHOS 374* 383* 463* 397* 409*  BILITOT 2.9* 2.6* 2.1* 1.4* 1.4*  PROT 5.9* 5.4* 5.0* 4.9* 5.1*  ALBUMIN 2.9* 2.4* 2.3* 2.1* 2.2*   Recent Labs  Lab 01/28/20 1301  LIPASE 14   No results for input(s): AMMONIA in the last 168 hours. Coagulation Profile: Recent Labs  Lab 01/29/20 0447  INR 1.0   Cardiac Enzymes: No results for input(s): CKTOTAL, CKMB, CKMBINDEX, TROPONINI in the last 168 hours. BNP (last 3 results) No results for input(s): PROBNP in the last 8760 hours. HbA1C: No results for input(s): HGBA1C in the last 72 hours. CBG: No results for input(s): GLUCAP in the last 168 hours. Lipid Profile: No results for input(s): CHOL, HDL, LDLCALC, TRIG, CHOLHDL, LDLDIRECT in the last 72 hours. Thyroid Function Tests: No results for input(s): TSH, T4TOTAL, FREET4, T3FREE, THYROIDAB in the last 72 hours. Anemia Panel: No results for input(s): VITAMINB12, FOLATE, FERRITIN, TIBC, IRON, RETICCTPCT in the last 72 hours. Urine analysis:    Component Value Date/Time   COLORURINE AMBER (A) 01/28/2020 0759   APPEARANCEUR CLEAR 01/28/2020 0759   LABSPEC 1.021 01/28/2020 0759   PHURINE 5.0 01/28/2020 0759   GLUCOSEU NEGATIVE 01/28/2020  0759   GLUCOSEU NEGATIVE 10/13/2014 1615   HGBUR NEGATIVE 01/28/2020 0759   BILIRUBINUR NEGATIVE 01/28/2020 0759   KETONESUR 20 (A) 01/28/2020 0759   PROTEINUR 30 (A) 01/28/2020 0759   UROBILINOGEN 0.2 10/13/2014 1615   NITRITE NEGATIVE 01/28/2020 0759   LEUKOCYTESUR TRACE (A) 01/28/2020 0759   Sepsis Labs: @LABRCNTIP (procalcitonin:4,lacticacidven:4)  ) Recent Results (from the past 240 hour(s))  SARS CORONAVIRUS 2 (TAT 6-24 HRS) Nasopharyngeal Nasopharyngeal Swab     Status: None   Collection Time: 01/28/20  1:28 PM   Specimen: Nasopharyngeal Swab  Result Value Ref Range Status   SARS Coronavirus 2 NEGATIVE NEGATIVE Final    Comment: (NOTE) SARS-CoV-2 target nucleic  acids are NOT DETECTED. The SARS-CoV-2 RNA is generally detectable in upper and lower respiratory specimens during the acute phase of infection. Negative results do not preclude SARS-CoV-2 infection, do not rule out co-infections with other pathogens, and should not be used as the sole basis for treatment or other patient management decisions. Negative results must be combined with clinical observations, patient history, and epidemiological information. The expected result is Negative. Fact Sheet for Patients: SugarRoll.be Fact Sheet for Healthcare Providers: https://www.woods-mathews.com/ This test is not yet approved or cleared by the [place] FDA and  has been authorized for detection and/or diagnosis of SARS-CoV-2 by FDA under an Emergency Use Authorization (EUA). This EUA will remain  in effect (meaning this test can be used) for the duration of the COVID-19 declaration under Section 56 4(b)(1) of the Act, 21 U.S.C. section 360bbb-3(b)(1), unless the authorization is terminated or revoked sooner. Performed at [facility], [place] [address] [place], [place] [address]   Surgical PCR screen     Status: None   Collection Time: 02/01/20  8:40 PM   Specimen:  Nasal Mucosa; Nasal Swab  Result Value Ref Range Status   MRSA, PCR NEGATIVE NEGATIVE Final   Staphylococcus aureus NEGATIVE NEGATIVE Final    Comment: (NOTE) The Xpert SA Assay (FDA approved for NASAL specimens in patients 98 years of age and older), is one component of a comprehensive surveillance program. It is not intended to diagnose infection nor to guide or monitor treatment. Performed at [facility], [place] [address] [place], [place] [address]          Radiology Studies: No results found.      Scheduled Meds: . bupivacaine      . calcitonin (salmon)  1 spray Alternating Nares Daily  . calcium-vitamin D  1 tablet Oral BID  . Chlorhexidine Gluconate Cloth  6 each Topical Q0600  . diclofenac Sodium  2 g Topical QID  . enoxaparin (LOVENOX) injection  40 mg Subcutaneous Q24H  . feeding supplement (ENSURE ENLIVE)  237 mL Oral TID BM  . fentaNYL      . levothyroxine  88 mcg Oral Q0600  . midazolam      . sodium chloride flush  3 mL Intravenous Q12H  . tobramycin      . Vitamin D (Ergocalciferol)  50,000 Units Oral Q7 days   Continuous Infusions: . sodium chloride 250 mL (01/30/20 1848)  . ceFAZolin       LOS: 5 days    Time spent: 25 minutes     Edwin Dada, MD [facility] 02/02/2020, 5:54 PM     Please page though [place] or Epic secure chat:  For [employer], Adult nurse

## 2020-02-03 ENCOUNTER — Encounter (HOSPITAL_COMMUNITY): Payer: Self-pay | Admitting: Internal Medicine

## 2020-02-03 MED ORDER — ALENDRONATE SODIUM 70 MG PO TABS: 70.0000 mg | ORAL_TABLET | ORAL | 11 refills | Status: DC

## 2020-02-03 MED ORDER — ENSURE ENLIVE PO LIQD: 237.0000 mL | Freq: Three times a day (TID) | ORAL | 12 refills | Status: DC

## 2020-02-03 MED ORDER — HYDROCODONE-ACETAMINOPHEN 5-325 MG PO TABS: 1.0000 | ORAL_TABLET | Freq: Four times a day (QID) | ORAL | 0 refills | Status: DC | PRN

## 2020-02-03 NOTE — Discharge Summary (Signed)
Physician Discharge Summary  LAZARIA SCHABEN PJA:[id number] DOB: 1935/08/10 DOA: 01/28/2020  PCP: Colon Branch, MD  Admit date: 01/28/2020 Discharge date: 02/03/2020  Admitted From: Home  Disposition:  Home with [facility]   Recommendations for Outpatient Follow-up:  1. Follow up with PCP in 1 weeks 2. Dr. Larose Kells: Please obtain LFTs in 1 week     Home Health: PT OT due to patient's ongoing pain with ambulation  Equipment/Devices: None  Discharge Condition: Fair  CODE STATUS: FULL Diet recommendation: Regular  Brief/Interim Summary: Mrs. Gammon is a 85 y.o. F with dementia, home dwelling, HTN, hypothyroidism, osteoporosis, recurrent vertebral fractures status post kyphoplasty who presents with back pain.  Patient first developed back pain beginning in February 2021, found to have new L1 compression fracture.  Neurosurgery directed TLSO brace and mobility, patient failed this and was admitted for kyphoplasty.  Discharged home, but readmitted 2 weeks later and L2 compression fracture.  Patient underwent kyphoplasty at L2 on 2/16.  Patient discharged to SNF, where she was for 3 weeks, and on the day of discharge, bent over and developed again back pain.  MRI spine confirmed T10 compression fracture, so the patient was admitted for kyphoplasty.         PRINCIPAL HOSPITAL DIAGNOSIS: T10 compression fracture, acute    Discharge Diagnoses:   Vertebral compression fracture T10, acute S/p kyphoplasty T10 by Dr. Estanislado Pandy Patient admitted, resumed calcitonin and treated aggressively for pain.  IR consulted, recommended kyphoplasty.  This was carried out, patient tolerated well, pain reduced after procedure.    Family have elected to take patient home for [facility] PT.  Re-start alendronate.  Continue calcium and vitamin D.      Hypothyroidism Continue levothyroxine  Dementia  Normocytic anemia  Hypertension Continue amlodipine    Transaminitis Improving.  No symptoms.  CT abdomen and  pelvis showed no liver abnormalities.   -Follow up LFTs with PCP           Discharge Instructions  Discharge Instructions    Diet - low sodium heart healthy   Complete by: As directed    Discharge instructions   Complete by: As directed    From Dr. Loleta Books: Take hydrocodone-acetaminophen 5-325 mg 1-2 tablets up to every 6 hours as needed (in the hospital, you have been able to get by with taking about twice per day)  Resume all your home medicines (baby aspirin, amlodipine for blood pressure, Synthroid, calcium and vitamin D)  Start the new medicine alendronate/Fosamax 70 mg weekly on Sundays Give with at least 8 ounces of plain water 30 min priot to breakfast.  Stay upright (not to lie down) for at least 30 minutes and until after first food of the day (to reduce esophageal irritation).  For nutrition, take an over the counter nutrition shake like Ensure for a few months   It's okay to stop the calcitonin  Go see Dr. Larose Kells in 1 week Have him check your liver function tests in a few weeks   Increase activity slowly   Complete by: As directed      Allergies as of 02/03/2020      Reactions   Sulfonamide Derivatives Other (See Comments)   Skin irritation      Medication List    STOP taking these medications   acetaminophen 500 MG tablet Commonly known as: TYLENOL   calcitonin (salmon) 200 UNIT/ACT nasal spray Commonly known as: MIACALCIN/FORTICAL   traMADol 50 MG tablet Commonly known as: Veatrice Bourbon  TAKE these medications   alendronate 70 MG tablet Commonly known as: Fosamax Take 1 tablet (70 mg total) by mouth every 7 (seven) days. On Sunday. Take with a full glass of water on an empty stomach.   amLODipine 2.5 MG tablet Commonly known as: NORVASC Take 2.5 mg by mouth daily.   aspirin EC 81 MG tablet Take 81 mg by mouth daily.   diclofenac Sodium 1 % Gel Commonly known as: VOLTAREN Apply 2 g topically 4 (four) times daily.   feeding supplement  (ENSURE ENLIVE) Liqd Take 237 mLs by mouth 3 (three) times daily between meals.   HYDROcodone-acetaminophen 5-325 MG tablet Commonly known as: NORCO/VICODIN Take 1-2 tablets by mouth every 6 (six) hours as needed for moderate pain or severe pain. What changed:   how much to take  when to take this  reasons to take this   hydrocortisone 2.5 % cream Apply topically 2 (two) times daily. What changed:   how much to take  when to take this  reasons to take this   levothyroxine 88 MCG tablet Commonly known as: SYNTHROID Take 1 tablet (88 mcg total) by mouth daily before breakfast.   multivitamin with minerals Tabs tablet Take 1 tablet by mouth daily.   Oscal 500/200 D-3 500-200 MG-UNIT tablet Generic drug: calcium-vitamin D Take 1 tablet by mouth 2 (two) times daily.   OVER THE COUNTER MEDICATION Apply 1 application topically 5 (five) times daily as needed (back pain). "Comfrey Salve"   polyethylene glycol 17 g packet Commonly known as: MIRALAX / GLYCOLAX Take 17 g by mouth daily as needed (constipation).   senna-docusate 8.6-50 MG tablet Commonly known as: Senokot-S Take 2 tablets by mouth 2 (two) times daily as needed for mild constipation or moderate constipation.   Vitamin D (Ergocalciferol) 1.25 MG (50000 UNIT) Caps capsule Commonly known as: DRISDOL Take 1 capsule (50,000 Units total) by mouth every 7 (seven) days.      Follow-up Information    [name], MD. Schedule an appointment as soon as possible for a visit in 1 week(s).   Specialty: Internal Medicine Contact information: [address] [place] [address] [phone]          Allergies  Allergen Reactions  . Sulfonamide Derivatives Other (See Comments)    Skin irritation    Consultations:  IR   Procedures/Studies: DG Lumbar Spine 2-3 Views  Result Date: 01/11/2020 CLINICAL DATA:  Fall, L2 compression fracture, prior vertebral augmentation EXAM: LUMBAR SPINE -  2-3 VIEW COMPARISON:  MRI lumbar spine dated 01/03/2020 FINDINGS: Normal lumbar lordosis. Prior vertebral augmentation at T12 and L1. Interval vertebral augmentation at L2. Mild (10-15%) loss of height at T12 and L2. Moderate (40%) loss of height at L1. No retropulsion. Visualized bony pelvis appears intact. IMPRESSION: Vertebral augmentation at T12-L2, with mild to moderate compression fracture deformities, as above. Electronically Signed   By: Charline Bills M.D.   On: 01/11/2020 15:31   MR THORACIC SPINE WO CONTRAST  Result Date: 01/28/2020 CLINICAL DATA:  Recurrent back pain. History of compression fracture status post augmentation. EXAM: MRI THORACIC AND LUMBAR SPINE WITHOUT CONTRAST TECHNIQUE: Multiplanar and multiecho pulse sequences of the thoracic and lumbar spine were obtained without intravenous contrast. COMPARISON:  Lumbar spine MRI 01/03/2020 FINDINGS: MRI THORACIC SPINE FINDINGS Alignment:  Normal. Vertebrae: T12 compression fracture status post augmentation with unchanged mild vertebral body height loss. T10 superior endplate compression fracture with 15% vertebral body height loss and mild  marrow edema along a visible fracture line without retropulsion. No suspicious marrow lesion. Small hemangiomas in the T2 and T9 vertebral bodies. Cord:  Normal signal and morphology. Paraspinal and other soft tissues: 1 cm T2 hyperintense focus in the upper pole of the left kidney, likely a cyst. Disc levels: Minimal disc bulging at T11-12 with slight posterior bowing of the T12 superior endplate. Scattered mild thoracic facet arthrosis. No spinal or neural foraminal stenosis. MRI LUMBAR SPINE FINDINGS Segmentation:  Standard. Alignment: Mild straightening of the normal lumbar lordosis with slight left convex curvature of the lumbar spine. No significant listhesis. Vertebrae: Previously augmented L1 compression fracture with unchanged vertebral body height loss. Interval augmentation of an L2 compression  fracture with progressive height loss now measuring 40% without retropulsion. Persistent marrow edema throughout much of the L2 vertebral body with less pronounced edema at L1. Mild chronic posterior L5 vertebral body height loss. No new lumbar spine fracture. No suspicious osseous lesion. Conus medullaris and cauda equina: Conus extends to the upper L1 level. Conus and cauda equina appear normal. Paraspinal and other soft tissues: Small left upper pole renal cyst as noted above. Disc levels: L1-2: Minimal leftward disc bulging without stenosis, unchanged. L2-3: Minimal disc bulging eccentric to the left without stenosis, unchanged. L3-4: Minimal disc bulging and facet arthrosis without stenosis, unchanged. L4-5: Moderate disc space narrowing. Mild disc bulging and facet arthrosis without stenosis, unchanged. L5-S1: Mild disc space narrowing. Minimal disc bulging and spurring without stenosis, unchanged. IMPRESSION: 1. Acute T10 compression fracture with mild height loss. 2. T12-L2 compressions fracture status post augmentation. 3. No thoracic or lumbar spinal canal or neural foraminal stenosis. Electronically Signed   By: Sebastian Ache M.D.   On: 01/28/2020 12:39   MR LUMBAR SPINE WO CONTRAST  Result Date: 01/28/2020 CLINICAL DATA:  Recurrent back pain. History of compression fracture status post augmentation. EXAM: MRI THORACIC AND LUMBAR SPINE WITHOUT CONTRAST TECHNIQUE: Multiplanar and multiecho pulse sequences of the thoracic and lumbar spine were obtained without intravenous contrast. COMPARISON:  Lumbar spine MRI 01/03/2020 FINDINGS: MRI THORACIC SPINE FINDINGS Alignment:  Normal. Vertebrae: T12 compression fracture status post augmentation with unchanged mild vertebral body height loss. T10 superior endplate compression fracture with 15% vertebral body height loss and mild marrow edema along a visible fracture line without retropulsion. No suspicious marrow lesion. Small hemangiomas in the T2 and T9  vertebral bodies. Cord:  Normal signal and morphology. Paraspinal and other soft tissues: 1 cm T2 hyperintense focus in the upper pole of the left kidney, likely a cyst. Disc levels: Minimal disc bulging at T11-12 with slight posterior bowing of the T12 superior endplate. Scattered mild thoracic facet arthrosis. No spinal or neural foraminal stenosis. MRI LUMBAR SPINE FINDINGS Segmentation:  Standard. Alignment: Mild straightening of the normal lumbar lordosis with slight left convex curvature of the lumbar spine. No significant listhesis. Vertebrae: Previously augmented L1 compression fracture with unchanged vertebral body height loss. Interval augmentation of an L2 compression fracture with progressive height loss now measuring 40% without retropulsion. Persistent marrow edema throughout much of the L2 vertebral body with less pronounced edema at L1. Mild chronic posterior L5 vertebral body height loss. No new lumbar spine fracture. No suspicious osseous lesion. Conus medullaris and cauda equina: Conus extends to the upper L1 level. Conus and cauda equina appear normal. Paraspinal and other soft tissues: Small left upper pole renal cyst as noted above. Disc levels: L1-2: Minimal leftward disc bulging without stenosis, unchanged. L2-3: Minimal disc bulging eccentric to  the left without stenosis, unchanged. L3-4: Minimal disc bulging and facet arthrosis without stenosis, unchanged. L4-5: Moderate disc space narrowing. Mild disc bulging and facet arthrosis without stenosis, unchanged. L5-S1: Mild disc space narrowing. Minimal disc bulging and spurring without stenosis, unchanged. IMPRESSION: 1. Acute T10 compression fracture with mild height loss. 2. T12-L2 compressions fracture status post augmentation. 3. No thoracic or lumbar spinal canal or neural foraminal stenosis. Electronically Signed   By: Sebastian Ache M.D.   On: 01/28/2020 12:39   CT ABDOMEN PELVIS W CONTRAST  Result Date: 01/28/2020 CLINICAL DATA:   Abdominal distension EXAM: CT ABDOMEN AND PELVIS WITH CONTRAST TECHNIQUE: Multidetector CT imaging of the abdomen and pelvis was performed using the standard protocol following bolus administration of intravenous contrast. CONTRAST:  75mL OMNIPAQUE IOHEXOL 300 MG/ML  SOLN COMPARISON:  None. FINDINGS: Lower chest: Bibasilar scarring.  Mild cardiomegaly. Hepatobiliary: No focal liver lesion. Mildly distended gallbladder. No wall thickening or calculi. Mild central intrahepatic duct dilatation. The common bile duct tapers without evidence stone or mass. Pancreas: Atrophy.  Otherwise unremarkable. Spleen: Unremarkable. Adrenals/Urinary Tract: Adrenals are unremarkable. Too small to characterize hypoattenuating lesion of the upper pole of the left kidney. No hydronephrosis. Bladder is unremarkable. Stomach/Bowel: The stomach is within normal limits. Bowel is normal in caliber. Extensive distal colonic diverticulosis. Moderate stool burden. Appendix is not visualized. Vascular/Lymphatic: Aortic atherosclerosis. No enlarged abdominal or pelvic lymph nodes. Reproductive: Status post hysterectomy. No adnexal masses. Other: No ascites.  No free air. Musculoskeletal: Decreased osseous mineralization. Compression deformity of T12-L2 with segment augmentation. Multilevel degenerative changes. IMPRESSION: Mildly distended gallbladder without additional evidence of acute cholecystitis. Mild central intrahepatic duct dilatation. Common bile duct tapers without stone or mass. Extensive colonic diverticulosis without evidence of diverticulitis. Aortic Atherosclerosis (ICD10-I70.0). Electronically Signed   By: Guadlupe Spanish M.D.   On: 01/28/2020 11:17   IR KYPHO LUMBAR INC FX REDUCE BONE BX UNI/BIL CANNULATION INC/IMAGING  Result Date: 01/07/2020 INDICATION: Severe low back pain secondary to compression fracture at L2. EXAM: BALLOON KYPHOPLASTY AT L2 COMPARISON:  MRI of the lumbosacral spine of January 03, 2020. MEDICATIONS: As  antibiotic prophylaxis, Ancef 2 g IV was ordered pre-procedure and administered intravenously within 1 hour of incision. ANESTHESIA/SEDATION: Moderate (conscious) sedation was employed during this procedure. A total of Versed 1.5 mg and Fentanyl 50 mcg, and Dilaudid 1 mg IV were administered intravenously. Moderate Sedation Time: 35 minutes. The patient's level of consciousness and vital signs were monitored continuously by radiology nursing throughout the procedure under my direct supervision. FLUOROSCOPY TIME:  Fluoroscopy Time: 13 minutes 24 seconds (1079 mGy) COMPLICATIONS: None immediate. PROCEDURE: Following a full explanation of the procedure along with the potential associated complications, an informed witnessed consent was obtained. The patient was placed prone on the fluoroscopic table. The skin overlying the lumbar region was then prepped and draped in the usual sterile fashion. The both pedicles at L2 were then infiltrated with 0.25% bupivacaine, followed by the advancement of a 11 gauge Jamshidi into the posterior 1/3 at L2. These were then exchanged for a Kyphon advanced osteo introducer system comprised of a working cannula and a Kyphon osteo drill. Combinations were then advanced over a Kyphon osteo bone pin until the tip of the Kyphon osteo drill was in the posterior third at L2. At this time, the bone pins were removed. In a medial trajectory, the combination were advanced until the tips of the working cannulae was inside the posterior one-third at L2. The osteo drills were removed. Through  the working cannulae, a Kyphon inflatable bone tamp 20 x 3 was advanced and positioned with the distal marker 5 mm from the anterior aspect of L2. Crossing of the midline was seen on the AP projection. At this time, the balloon was expanded using contrast via a Kyphon inflation syringe device via microtubing. Inflations were continued until there was apposition with the superior endplate. At this time,  methylmethacrylate mixture was reconstituted with Tobramycin in the Kyphon bone mixing device system. This was then loaded onto the Kyphon bone fillers. The balloons were deflated and removed followed by the instillation of 5 bone filler equivalents of methylmethacrylate mixture with excellent filling in the AP and lateral projections. No extravasation was noted in the disk spaces or posteriorly into the spinal canal. No epidural venous contamination was seen. The working cannula and the bone filler were then retrieved and removed. Hemostasis was achieved at the skin entry sites. IMPRESSION: 1. Status post vertebral body augmentation using balloon kyphoplasty at L2 as described without event. Electronically Signed   By: Julieanne Cotton M.D.   On: 01/06/2020 14:57       Subjective: Back pain improved.  No chest pain, arm or leg discomfort, urinary irritative symptoms.  Discharge Exam: Vitals:   02/03/20 1214 02/03/20 1222  BP: (!) 122/45 (!) 142/54  Pulse: (!) 46 86  Resp: (!) 26 18  Temp: 98.3 F (36.8 C)   SpO2: 96% 97%   Vitals:   02/03/20 0409 02/03/20 0828 02/03/20 1214 02/03/20 1222  BP: (!) 157/62 (!) 142/58 (!) 122/45 (!) 142/54  Pulse: (!) 59 (!) 42 (!) 46 86  Resp: 18 16 (!) 26 18  Temp: 98.9 F (37.2 C) 98 F (36.7 C) 98.3 F (36.8 C)   TempSrc: Oral Oral Oral   SpO2: 95% 96% 96% 97%  Weight:      Height:        General: Pt is alert, awake, not in acute distress Cardiovascular: RRR, nl S1-S2, no murmurs appreciated.   No LE edema.   Respiratory: Normal respiratory rate and rhythm.  CTAB without rales or wheezes. Abdominal: Abdomen soft and non-tender.  No distension or HSM.   Neuro/Psych: Strength symmetric in upper and lower extremities.  Judgment and insight appear impaired by dementia.   The results of significant diagnostics from this hospitalization (including imaging, microbiology, ancillary and laboratory) are listed below for reference.      Microbiology: Recent Results (from the past 240 hour(s))  SARS CORONAVIRUS 2 (TAT 6-24 HRS) Nasopharyngeal Nasopharyngeal Swab     Status: None   Collection Time: 01/28/20  1:28 PM   Specimen: Nasopharyngeal Swab  Result Value Ref Range Status   SARS Coronavirus 2 NEGATIVE NEGATIVE Final    Comment: (NOTE) SARS-CoV-2 target nucleic acids are NOT DETECTED. The SARS-CoV-2 RNA is generally detectable in upper and lower respiratory specimens during the acute phase of infection. Negative results do not preclude SARS-CoV-2 infection, do not rule out co-infections with other pathogens, and should not be used as the sole basis for treatment or other patient management decisions. Negative results must be combined with clinical observations, patient history, and epidemiological information. The expected result is Negative. Fact Sheet for Patients: HairSlick.no Fact Sheet for Healthcare Providers: quierodirigir.com This test is not yet approved or cleared by the [place] FDA and  has been authorized for detection and/or diagnosis of SARS-CoV-2 by FDA under an Emergency Use Authorization (EUA). This EUA will remain  in effect (meaning this test can  be used) for the duration of the COVID-19 declaration under Section 56 4(b)(1) of the Act, 21 U.S.C. section 360bbb-3(b)(1), unless the authorization is terminated or revoked sooner. Performed at [facility], [address] [place], [place] [address]   Surgical PCR screen     Status: None   Collection Time: 02/01/20  8:40 PM   Specimen: Nasal Mucosa; Nasal Swab  Result Value Ref Range Status   MRSA, PCR NEGATIVE NEGATIVE Final   Staphylococcus aureus NEGATIVE NEGATIVE Final    Comment: (NOTE) The Xpert SA Assay (FDA approved for NASAL specimens in patients 83 years of age and older), is one component of a comprehensive surveillance program. It is not intended to diagnose  infection nor to guide or monitor treatment. Performed at [facility], [address] [place], [place] [address]      Labs: BNP (last 3 results) No results for input(s): BNP in the last 8760 hours. Basic Metabolic Panel: Recent Labs  Lab 01/28/20 0854 01/28/20 2207 01/29/20 0447 01/30/20 0434 01/31/20 0415 02/01/20 0429  NA 139  --  142 143 144 142  K 3.3*  --  3.3* 3.8 3.7 3.8  CL 108  --  109 112* 116* 110  CO2 23  --  22 22 22 23   GLUCOSE 99  --  71 109* 96 93  BUN 18  --  16 17 14 15   CREATININE 0.57  --  0.60 0.57 0.49 0.52  CALCIUM 8.6* 8.7 8.7* 8.8* 8.4* 8.5*  MG  --   --   --  1.7 1.6*  --    Liver Function Tests: Recent Labs  Lab 01/28/20 0854 01/29/20 0447 01/30/20 0434 01/31/20 0415 02/01/20 0429  AST 181* 127* 195* 121* 91*  ALT 151* 110* 132* 104* 90*  ALKPHOS 374* 383* 463* 397* 409*  BILITOT 2.9* 2.6* 2.1* 1.4* 1.4*  PROT 5.9* 5.4* 5.0* 4.9* 5.1*  ALBUMIN 2.9* 2.4* 2.3* 2.1* 2.2*   Recent Labs  Lab 01/28/20 1301  LIPASE 14   No results for input(s): AMMONIA in the last 168 hours. CBC: Recent Labs  Lab 01/27/20 1903 01/28/20 0854 01/30/20 0434 01/31/20 0415 02/01/20 0429  WBC 8.9 9.1 5.5 5.6 5.9  NEUTROABS  --  6.4  --   --   --   HGB 13.8 13.5 11.6* 11.3* 11.6*  HCT 42.2 42.4 35.5* 34.1* 34.6*  MCV 92.1 95.3 91.0 94.2 90.3  PLT 284 220 209 198 220   Cardiac Enzymes: No results for input(s): CKTOTAL, CKMB, CKMBINDEX, TROPONINI in the last 168 hours. BNP: Invalid input(s): POCBNP CBG: No results for input(s): GLUCAP in the last 168 hours. D-Dimer No results for input(s): DDIMER in the last 72 hours. Hgb A1c No results for input(s): HGBA1C in the last 72 hours. Lipid Profile No results for input(s): CHOL, HDL, LDLCALC, TRIG, CHOLHDL, LDLDIRECT in the last 72 hours. Thyroid function studies No results for input(s): TSH, T4TOTAL, T3FREE, THYROIDAB in the last 72 hours.  Invalid input(s): FREET3 Anemia work up No  results for input(s): VITAMINB12, FOLATE, FERRITIN, TIBC, IRON, RETICCTPCT in the last 72 hours. Urinalysis    Component Value Date/Time   COLORURINE AMBER (A) 01/28/2020 0759   APPEARANCEUR CLEAR 01/28/2020 0759   LABSPEC 1.021 01/28/2020 0759   PHURINE 5.0 01/28/2020 0759   GLUCOSEU NEGATIVE 01/28/2020 0759   GLUCOSEU NEGATIVE 10/13/2014 1615   HGBUR NEGATIVE 01/28/2020 0759   BILIRUBINUR NEGATIVE 01/28/2020 0759   KETONESUR 20 (A) 01/28/2020 0759  PROTEINUR 30 (A) 01/28/2020 0759   UROBILINOGEN 0.2 10/13/2014 1615   NITRITE NEGATIVE 01/28/2020 0759   LEUKOCYTESUR TRACE (A) 01/28/2020 0759   Sepsis Labs Invalid input(s): PROCALCITONIN,  WBC,  LACTICIDVEN Microbiology Recent Results (from the past 240 hour(s))  SARS CORONAVIRUS 2 (TAT 6-24 HRS) Nasopharyngeal Nasopharyngeal Swab     Status: None   Collection Time: 01/28/20  1:28 PM   Specimen: Nasopharyngeal Swab  Result Value Ref Range Status   SARS Coronavirus 2 NEGATIVE NEGATIVE Final    Comment: (NOTE) SARS-CoV-2 target nucleic acids are NOT DETECTED. The SARS-CoV-2 RNA is generally detectable in upper and lower respiratory specimens during the acute phase of infection. Negative results do not preclude SARS-CoV-2 infection, do not rule out co-infections with other pathogens, and should not be used as the sole basis for treatment or other patient management decisions. Negative results must be combined with clinical observations, patient history, and epidemiological information. The expected result is Negative. Fact Sheet for Patients: HairSlick.no Fact Sheet for Healthcare Providers: quierodirigir.com This test is not yet approved or cleared by the [place] FDA and  has been authorized for detection and/or diagnosis of SARS-CoV-2 by FDA under an Emergency Use Authorization (EUA). This EUA will remain  in effect (meaning this test can be used) for the duration  of the COVID-19 declaration under Section 56 4(b)(1) of the Act, 21 U.S.C. section 360bbb-3(b)(1), unless the authorization is terminated or revoked sooner. Performed at [facility], [address] [place], [place] [address]   Surgical PCR screen     Status: None   Collection Time: 02/01/20  8:40 PM   Specimen: Nasal Mucosa; Nasal Swab  Result Value Ref Range Status   MRSA, PCR NEGATIVE NEGATIVE Final   Staphylococcus aureus NEGATIVE NEGATIVE Final    Comment: (NOTE) The Xpert SA Assay (FDA approved for NASAL specimens in patients 60 years of age and older), is one component of a comprehensive surveillance program. It is not intended to diagnose infection nor to guide or monitor treatment. Performed at [facility], [address] [place], [place] [address]      Time coordinating discharge: 25 minutes The [place] controlled substances registry was reviewed for this patient      SIGNED:   Alberteen Sam, MD  [facility] 02/03/2020, 7:35 PM

## 2020-02-03 NOTE — Progress Notes (Signed)
Patient is being discharged home with home heath.  IV removed with the catheter intact.  Discharge instructions and prescription information given to the patient and family who verbalized understanding.

## 2020-02-03 NOTE — TOC Progression Note (Cosign Needed Addendum)
Transition of Care [facility]) - Progression Note    Patient Details  Name: Amy Weiss MRN: [id number] Date of Birth: 1934/12/18  Transition of [facility] CM/SW Contact  Terrilee Croak, Student-Social Work Phone Number: 02/03/2020, 11:44 AM  Clinical Narrative:    MSW Intern spoke with Pt, after a nurse noted that the family was now wanting to take the patient home with [facility]. Pt was disoriented, but kept stating she wanted to go home. Permission was granted to contact patient's family. Patient's daughter, Amy Weiss confirmed that they now would like for her to come home. They have 12 hour supervision and states that she sleeps through the night. They also have DME at the house. She stated that she wanted to use comfort care as the [facility] agency. She agreed to try another if they could not offer what they needed. She also noted that they are looking at other agencies to provide care as soon as they can sell the pt's house for money for the care. Amy Weiss stated that they are ready for discharge and would like to speak with the dr. Dr. Truitt Merle. MSW Intern spoke with [facility] and they agreed to take the pt. Will update them when discharge is available. Dr. Stann Mainland follow up with Amy Weiss and MSW Intern will follow up with Pt and [name]. Will follow for discharge planning.   Addendum 02/03/2020 Family called and stated that he is actually [facility] with [facility] and they confirmed. Daughter is coming to get pt soon.  Expected Discharge Plan: Home w Home Health Services Barriers to Discharge: Continued Medical Work up  Expected Discharge Plan and Services Expected Discharge Plan: Home w Home Health Services In-house Referral: Clinical Social Work   Post Acute Care Choice: Home Health Living arrangements for the past 2 months: Single Family Home                 DME Arranged: Environmental consultant, Bedside commode         HH Arranged: PT, OT, Nurse's Aide HH Agency: [facility] Date [facility] Agency Contacted:  02/03/20   Representative spoke with at [facility] Agency: Cheryll   Social Determinants of Health (SDOH) Interventions    Readmission Risk Interventions No flowsheet data found.

## 2020-02-03 NOTE — Plan of Care (Signed)
  Problem: Clinical Measurements: Goal: Respiratory complications will improve Outcome: Progressing   

## 2020-02-04 ENCOUNTER — Ambulatory Visit (INDEPENDENT_AMBULATORY_CARE_PROVIDER_SITE_OTHER): Payer: Medicare HMO | Admitting: Internal Medicine

## 2020-02-04 VITALS — Temp 96.3°F | Ht 67.0 in | Wt 136.0 lb

## 2020-02-04 DIAGNOSIS — R7989 Other specified abnormal findings of blood chemistry: Secondary | ICD-10-CM

## 2020-02-04 DIAGNOSIS — S32000A Wedge compression fracture of unspecified lumbar vertebra, initial encounter for closed fracture: Secondary | ICD-10-CM | POA: Diagnosis not present

## 2020-02-04 DIAGNOSIS — N390 Urinary tract infection, site not specified: Secondary | ICD-10-CM

## 2020-02-04 DIAGNOSIS — R945 Abnormal results of liver function studies: Secondary | ICD-10-CM

## 2020-02-04 MED ORDER — ESCITALOPRAM OXALATE 5 MG PO TABS: 5.0000 mg | ORAL_TABLET | Freq: Every day | ORAL | 1 refills | Status: DC

## 2020-02-04 NOTE — Progress Notes (Signed)
Pre visit review using our clinic review tool, if applicable. No additional management support is needed unless otherwise documented below in the visit note. 

## 2020-02-04 NOTE — Progress Notes (Addendum)
Subjective:    Patient ID: Amy Weiss, female    DOB: [birth year], 85 y.o.   MRN: [id number]  DOS:  02/04/2020 Type of visit - description: Virtual Visit via Telephone  Attempted  to make this a video visit, due to technical difficulties from the patient side it was not possible  thus we proceeded with a Virtual Visit via Telephone    I connected with above mentioned patient  by telephone and verified that I am speaking with the correct person using two identifiers.  THIS ENCOUNTER IS A VIRTUAL VISIT DUE TO COVID-19 - PATIENT WAS NOT SEEN IN THE OFFICE. PATIENT HAS CONSENTED TO VIRTUAL VISIT / TELEMEDICINE VISIT   Location of patient: home  Location of provider: office  I discussed the limitations, risks, security and privacy concerns of performing an evaluation and management service by telephone and the availability of in person appointments. I also discussed with the patient that there may be a patient responsible charge related to this service. The patient expressed understanding and agreed to proceed.   Acute visit and hospital follow-up  Admitted to hospital 01/28/2020, discharged 02/03/2020. Was admitted with severe back pain, she was found to have another vertebral fracture and had another kyphoplasty 02/02/2020, discharged the next day. She was discharged home with [facility].  LFTs including alkaline phosphate were elevated, CT abdomen and pelvis showed mild distended gallbladder without evidence of acute cholecystitis, mild central intrahepatic duct dilatation.  Common bile duct tapers without stone or mass.  Urinalysis 01/28/2020, showed no bacteria, 0-5 RBCs and WBCs.  No urine culture to my knowledge  Review of Systems This visit was set up by the patient and her daughter. Starting yesterday developed dysuria and she thinks  has a UTI.  No fever chills Appetite is decreased Some nausea but no vomiting. Some constipation. No abdominal pain. No ambulatory blood  pressures.  Pain is relatively well controlled.  They also report anxiety, Ativan?.  Past Medical History:  Diagnosis Date  . Diabetes mellitus 4/09   A1C-6  . Familial tremor   . Hyperlipidemia   . Hypothyroidism   . Osteoporosis   . Tachycardia    AV Re-entry, s/p ablation aprox 2005  . TIA (transient ischemic attack) 12/09    Past Surgical History:  Procedure Laterality Date  . ABDOMINAL HYSTERECTOMY     no oophorectomy  . BLADDER SURGERY     x 2 in the 80s  . BREAST BIOPSY     L (-)  . IR KYPHO EA ADDL LEVEL THORACIC OR LUMBAR  12/29/2019  . IR KYPHO LUMBAR INC FX REDUCE BONE BX UNI/BIL CANNULATION INC/IMAGING  01/06/2020  . IR KYPHO THORACIC WITH BONE BIOPSY  12/29/2019  . IR KYPHO THORACIC WITH BONE BIOPSY  02/02/2020    Allergies as of 02/04/2020      Reactions   Sulfonamide Derivatives Other (See Comments)   Skin irritation      Medication List       Accurate as of February 04, 2020 11:59 PM. If you have any questions, ask your nurse or doctor.        alendronate 70 MG tablet Commonly known as: Fosamax Take 1 tablet (70 mg total) by mouth every 7 (seven) days. On Sunday. Take with a full glass of water on an empty stomach.   amLODipine 2.5 MG tablet Commonly known as: NORVASC Take 2.5 mg by mouth daily.   aspirin EC 81 MG tablet Take 81 mg by mouth  daily.   diclofenac Sodium 1 % Gel Commonly known as: VOLTAREN Apply 2 g topically 4 (four) times daily.   escitalopram 5 MG tablet Commonly known as: Lexapro Take 1 tablet (5 mg total) by mouth daily. Started by: Kathlene November, MD   feeding supplement (ENSURE ENLIVE) Liqd Take 237 mLs by mouth 3 (three) times daily between meals.   HYDROcodone-acetaminophen 5-325 MG tablet Commonly known as: NORCO/VICODIN Take 1-2 tablets by mouth every 6 (six) hours as needed for moderate pain or severe pain.   hydrocortisone 2.5 % cream Apply topically 2 (two) times daily. What changed:   how much to take  when to  take this  reasons to take this   levothyroxine 88 MCG tablet Commonly known as: SYNTHROID Take 1 tablet (88 mcg total) by mouth daily before breakfast.   multivitamin with minerals Tabs tablet Take 1 tablet by mouth daily.   Oscal 500/200 D-3 500-200 MG-UNIT tablet Generic drug: calcium-vitamin D Take 1 tablet by mouth 2 (two) times daily.   OVER THE COUNTER MEDICATION Apply 1 application topically 5 (five) times daily as needed (back pain). "Comfrey Salve"   polyethylene glycol 17 g packet Commonly known as: MIRALAX / GLYCOLAX Take 17 g by mouth daily as needed (constipation).   senna-docusate 8.6-50 MG tablet Commonly known as: Senokot-S Take 2 tablets by mouth 2 (two) times daily as needed for mild constipation or moderate constipation.   Vitamin D (Ergocalciferol) 1.25 MG (50000 UNIT) Caps capsule Commonly known as: DRISDOL Take 1 capsule (50,000 Units total) by mouth every 7 (seven) days.          Objective:   Physical Exam Temp (!) 96.3 F (35.7 C)   Ht 5\' 7"  (1.702 m)   Wt 136 lb (61.7 kg)   BMI 21.30 kg/m  This is a telephone virtual visit, I spoke with the patient and Amy Weiss at length.  The patient sounded alert and actually oriented x3 and in no distress    Assessment       Assessment > Prediabetes   dx 2009 >>>  A1c 6.0 Hypothyroidism TIA 2009 DJD  Osteoporosis per dexa 2008, consistently declined retesting Familial tremor CV: see cardiology at [facility] ---AV reentry tachycardia, ablation 2005 ---Bradycardic , Mobitz I , junctional escapes -- saw cards @ [facility], f/u 1 year, pt decided not to go back  PLAN Telephone virtual visit, I spoke with the patient and [name]. Lumbar compression fracture: Discharge from the hospital yesterday, had a kyphoplasty.  Now at home, plan is to get PT to help her. She is under the care of her family. This morning pain was 7-10, has taken Vicodin x2 and  this afternoon the pain is better. She is  somewhat constipated, encourage MiraLAX. UTI?  The reason why the call is a think she has a UTI, symptoms started yesterday, the daughter will pick up a sterile container and bring a fresh urine sample tomorrow.  Treat based on findings. MCI & anxiety: The patient has some anxiety and sundowning, Ativan in the hospital setting was helpful, prescribed Ativan?  I discussed with them the risk of oversedation and a fall, and then we agreed on Lexapro 5 mg daily. Increase LFTs: Chart is reviewed, LFTs were increased, CT of the abdomen not completely normal, see report.  I recommend recheck LFTs.  The patient and the daughter declined, the patient will not accept any further evaluation or  Testing. I told him that one of the DDx  includes liver cancer, the patient was very adamant about no further evaluation consequently will not recheck LFTs. RTC: 1 month,  the patient's daughter will call and make appointment     I discussed the assessment and treatment plan with the patient. The patient was provided an opportunity to ask questions and all were answered. The patient agreed with the plan and demonstrated an understanding of the instructions.   The patient was advised to call back or seek an in-person evaluation if the symptoms worsen or if the condition fails to improve as anticipated.  I provided 30 minutes of non-face-to-face time during this encounter.  Willow Ora, MD

## 2020-02-05 ENCOUNTER — Other Ambulatory Visit: Payer: Self-pay

## 2020-02-05 ENCOUNTER — Other Ambulatory Visit (INDEPENDENT_AMBULATORY_CARE_PROVIDER_SITE_OTHER): Payer: Medicare HMO

## 2020-02-05 DIAGNOSIS — E039 Hypothyroidism, unspecified: Secondary | ICD-10-CM | POA: Diagnosis not present

## 2020-02-05 DIAGNOSIS — R634 Abnormal weight loss: Secondary | ICD-10-CM | POA: Diagnosis not present

## 2020-02-05 DIAGNOSIS — I10 Essential (primary) hypertension: Secondary | ICD-10-CM | POA: Diagnosis not present

## 2020-02-05 DIAGNOSIS — D649 Anemia, unspecified: Secondary | ICD-10-CM | POA: Diagnosis not present

## 2020-02-05 DIAGNOSIS — R627 Adult failure to thrive: Secondary | ICD-10-CM | POA: Diagnosis not present

## 2020-02-05 DIAGNOSIS — R69 Illness, unspecified: Secondary | ICD-10-CM | POA: Diagnosis not present

## 2020-02-05 DIAGNOSIS — R001 Bradycardia, unspecified: Secondary | ICD-10-CM | POA: Diagnosis not present

## 2020-02-05 DIAGNOSIS — N390 Urinary tract infection, site not specified: Secondary | ICD-10-CM

## 2020-02-05 DIAGNOSIS — M8008XD Age-related osteoporosis with current pathological fracture, vertebra(e), subsequent encounter for fracture with routine healing: Secondary | ICD-10-CM | POA: Diagnosis not present

## 2020-02-05 DIAGNOSIS — E119 Type 2 diabetes mellitus without complications: Secondary | ICD-10-CM | POA: Diagnosis not present

## 2020-02-05 DIAGNOSIS — E785 Hyperlipidemia, unspecified: Secondary | ICD-10-CM | POA: Diagnosis not present

## 2020-02-05 LAB — URINALYSIS, ROUTINE W REFLEX MICROSCOPIC
Bilirubin Urine: NEGATIVE
Hgb urine dipstick: NEGATIVE
Ketones, ur: NEGATIVE
Leukocytes,Ua: NEGATIVE
Nitrite: NEGATIVE
RBC / HPF: NONE SEEN (ref 0–?)
Specific Gravity, Urine: 1.015 (ref 1.000–1.030)
Total Protein, Urine: NEGATIVE
Urine Glucose: NEGATIVE
Urobilinogen, UA: 0.2 (ref 0.0–1.0)
WBC, UA: NONE SEEN (ref 0–?)
pH: 8 (ref 5.0–8.0)

## 2020-02-05 NOTE — Assessment & Plan Note (Addendum)
Telephone virtual visit, I spoke with the patient and [name]. Lumbar compression fracture: Discharge from the hospital yesterday, had a kyphoplasty.  Now at home, plan is to get PT to help her. She is under the care of her family. This morning pain was 7-10, has taken Vicodin x2 and  this afternoon the pain is better. She is somewhat constipated, encourage MiraLAX. UTI?  The reason why the call is a think she has a UTI, symptoms started yesterday, the daughter will pick up a sterile container and bring a fresh urine sample tomorrow.  Treat based on findings. MCI & anxiety: The patient has some anxiety and sundowning, Ativan in the hospital setting was helpful, prescribed Ativan?  I discussed with them the risk of oversedation and a fall, and then we agreed on Lexapro 5 mg daily. Increase LFTs: Chart is reviewed, LFTs were increased, CT of the abdomen not completely normal, see report.  I recommend recheck LFTs.  The patient and the daughter declined, the patient will not accept any further evaluation or  Testing. I told him that one of the DDx includes liver cancer, the patient was very adamant about no further evaluation consequently will not recheck LFTs. RTC: 1 month,  the patient's daughter will call and make appointment

## 2020-02-06 ENCOUNTER — Telehealth: Payer: Self-pay | Admitting: Internal Medicine

## 2020-02-06 DIAGNOSIS — M8008XD Age-related osteoporosis with current pathological fracture, vertebra(e), subsequent encounter for fracture with routine healing: Secondary | ICD-10-CM | POA: Diagnosis not present

## 2020-02-06 DIAGNOSIS — I10 Essential (primary) hypertension: Secondary | ICD-10-CM | POA: Diagnosis not present

## 2020-02-06 DIAGNOSIS — E039 Hypothyroidism, unspecified: Secondary | ICD-10-CM | POA: Diagnosis not present

## 2020-02-06 DIAGNOSIS — E785 Hyperlipidemia, unspecified: Secondary | ICD-10-CM | POA: Diagnosis not present

## 2020-02-06 DIAGNOSIS — R69 Illness, unspecified: Secondary | ICD-10-CM | POA: Diagnosis not present

## 2020-02-06 DIAGNOSIS — R627 Adult failure to thrive: Secondary | ICD-10-CM | POA: Diagnosis not present

## 2020-02-06 DIAGNOSIS — D649 Anemia, unspecified: Secondary | ICD-10-CM | POA: Diagnosis not present

## 2020-02-06 DIAGNOSIS — E119 Type 2 diabetes mellitus without complications: Secondary | ICD-10-CM | POA: Diagnosis not present

## 2020-02-06 DIAGNOSIS — R634 Abnormal weight loss: Secondary | ICD-10-CM | POA: Diagnosis not present

## 2020-02-06 DIAGNOSIS — R001 Bradycardia, unspecified: Secondary | ICD-10-CM | POA: Diagnosis not present

## 2020-02-06 LAB — URINE CULTURE
MICRO NUMBER:: 10266150
SPECIMEN QUALITY:: ADEQUATE

## 2020-02-06 NOTE — Telephone Encounter (Signed)
Spoke w/ Mare Loan- verbal orders given. Informed of chronic bradycardia.

## 2020-02-06 NOTE — Telephone Encounter (Signed)
Please advise 

## 2020-02-06 NOTE — Telephone Encounter (Signed)
Okay PT orders. Patient has chronic bradycardia.

## 2020-02-06 NOTE — Telephone Encounter (Signed)
Caller : Cecilia/ [facility] Call Back # [phone]  Verbal Order For PT   Once for 1week Twice a week for Three weeks   Beginning yesterday March 18,2021  HOME HEALTH AIDE FOR assist   twice a week to 3 weeks   Being next week March 22,2021   Patient heart beat is running low (36-44)  Please Advise

## 2020-02-07 DIAGNOSIS — R69 Illness, unspecified: Secondary | ICD-10-CM | POA: Diagnosis not present

## 2020-02-07 DIAGNOSIS — R001 Bradycardia, unspecified: Secondary | ICD-10-CM | POA: Diagnosis not present

## 2020-02-07 DIAGNOSIS — D649 Anemia, unspecified: Secondary | ICD-10-CM | POA: Diagnosis not present

## 2020-02-07 DIAGNOSIS — M8008XD Age-related osteoporosis with current pathological fracture, vertebra(e), subsequent encounter for fracture with routine healing: Secondary | ICD-10-CM | POA: Diagnosis not present

## 2020-02-07 DIAGNOSIS — E039 Hypothyroidism, unspecified: Secondary | ICD-10-CM | POA: Diagnosis not present

## 2020-02-07 DIAGNOSIS — I10 Essential (primary) hypertension: Secondary | ICD-10-CM | POA: Diagnosis not present

## 2020-02-07 DIAGNOSIS — R634 Abnormal weight loss: Secondary | ICD-10-CM | POA: Diagnosis not present

## 2020-02-07 DIAGNOSIS — E785 Hyperlipidemia, unspecified: Secondary | ICD-10-CM | POA: Diagnosis not present

## 2020-02-07 DIAGNOSIS — E119 Type 2 diabetes mellitus without complications: Secondary | ICD-10-CM | POA: Diagnosis not present

## 2020-02-07 DIAGNOSIS — R627 Adult failure to thrive: Secondary | ICD-10-CM | POA: Diagnosis not present

## 2020-02-09 ENCOUNTER — Telehealth: Payer: Self-pay

## 2020-02-09 NOTE — Addendum Note (Signed)
Addended by: Willow Ora E on: 02/09/2020 12:30 PM   Modules accepted: Level of Service

## 2020-02-09 NOTE — Telephone Encounter (Signed)
[place] from [facility] called in to get Verbal orders for Occupational therapy  1 time a week for 4 weeks, please call [place] back at [phone] also its ok to send a message as well.

## 2020-02-09 NOTE — Telephone Encounter (Signed)
LMOM for [place]- verbal orders given.

## 2020-02-10 ENCOUNTER — Telehealth: Payer: Self-pay

## 2020-02-10 DIAGNOSIS — E119 Type 2 diabetes mellitus without complications: Secondary | ICD-10-CM | POA: Diagnosis not present

## 2020-02-10 DIAGNOSIS — R001 Bradycardia, unspecified: Secondary | ICD-10-CM | POA: Diagnosis not present

## 2020-02-10 DIAGNOSIS — E785 Hyperlipidemia, unspecified: Secondary | ICD-10-CM | POA: Diagnosis not present

## 2020-02-10 DIAGNOSIS — R634 Abnormal weight loss: Secondary | ICD-10-CM | POA: Diagnosis not present

## 2020-02-10 DIAGNOSIS — E039 Hypothyroidism, unspecified: Secondary | ICD-10-CM | POA: Diagnosis not present

## 2020-02-10 DIAGNOSIS — D649 Anemia, unspecified: Secondary | ICD-10-CM | POA: Diagnosis not present

## 2020-02-10 DIAGNOSIS — R69 Illness, unspecified: Secondary | ICD-10-CM | POA: Diagnosis not present

## 2020-02-10 DIAGNOSIS — R627 Adult failure to thrive: Secondary | ICD-10-CM | POA: Diagnosis not present

## 2020-02-10 DIAGNOSIS — M8008XD Age-related osteoporosis with current pathological fracture, vertebra(e), subsequent encounter for fracture with routine healing: Secondary | ICD-10-CM | POA: Diagnosis not present

## 2020-02-10 DIAGNOSIS — I10 Essential (primary) hypertension: Secondary | ICD-10-CM | POA: Diagnosis not present

## 2020-02-10 NOTE — Telephone Encounter (Signed)
Physician orders received from [facility]. Form signed and faxed back to [phone]. Form sent for scanning.

## 2020-02-11 DIAGNOSIS — R001 Bradycardia, unspecified: Secondary | ICD-10-CM | POA: Diagnosis not present

## 2020-02-11 DIAGNOSIS — M8008XD Age-related osteoporosis with current pathological fracture, vertebra(e), subsequent encounter for fracture with routine healing: Secondary | ICD-10-CM | POA: Diagnosis not present

## 2020-02-11 DIAGNOSIS — D649 Anemia, unspecified: Secondary | ICD-10-CM | POA: Diagnosis not present

## 2020-02-11 DIAGNOSIS — I10 Essential (primary) hypertension: Secondary | ICD-10-CM | POA: Diagnosis not present

## 2020-02-11 DIAGNOSIS — E039 Hypothyroidism, unspecified: Secondary | ICD-10-CM | POA: Diagnosis not present

## 2020-02-11 DIAGNOSIS — R634 Abnormal weight loss: Secondary | ICD-10-CM | POA: Diagnosis not present

## 2020-02-11 DIAGNOSIS — E119 Type 2 diabetes mellitus without complications: Secondary | ICD-10-CM | POA: Diagnosis not present

## 2020-02-11 DIAGNOSIS — R69 Illness, unspecified: Secondary | ICD-10-CM | POA: Diagnosis not present

## 2020-02-11 DIAGNOSIS — R627 Adult failure to thrive: Secondary | ICD-10-CM | POA: Diagnosis not present

## 2020-02-11 DIAGNOSIS — E785 Hyperlipidemia, unspecified: Secondary | ICD-10-CM | POA: Diagnosis not present

## 2020-02-12 DIAGNOSIS — Z7982 Long term (current) use of aspirin: Secondary | ICD-10-CM | POA: Diagnosis not present

## 2020-02-12 DIAGNOSIS — Z8673 Personal history of transient ischemic attack (TIA), and cerebral infarction without residual deficits: Secondary | ICD-10-CM | POA: Diagnosis not present

## 2020-02-12 DIAGNOSIS — R32 Unspecified urinary incontinence: Secondary | ICD-10-CM | POA: Diagnosis not present

## 2020-02-12 DIAGNOSIS — E039 Hypothyroidism, unspecified: Secondary | ICD-10-CM | POA: Diagnosis not present

## 2020-02-12 DIAGNOSIS — M8008XD Age-related osteoporosis with current pathological fracture, vertebra(e), subsequent encounter for fracture with routine healing: Secondary | ICD-10-CM | POA: Diagnosis not present

## 2020-02-12 DIAGNOSIS — R69 Illness, unspecified: Secondary | ICD-10-CM | POA: Diagnosis not present

## 2020-02-12 DIAGNOSIS — I10 Essential (primary) hypertension: Secondary | ICD-10-CM | POA: Diagnosis not present

## 2020-02-12 DIAGNOSIS — D649 Anemia, unspecified: Secondary | ICD-10-CM | POA: Diagnosis not present

## 2020-02-12 DIAGNOSIS — R634 Abnormal weight loss: Secondary | ICD-10-CM | POA: Diagnosis not present

## 2020-02-12 DIAGNOSIS — F419 Anxiety disorder, unspecified: Secondary | ICD-10-CM | POA: Diagnosis not present

## 2020-02-12 DIAGNOSIS — E119 Type 2 diabetes mellitus without complications: Secondary | ICD-10-CM | POA: Diagnosis not present

## 2020-02-12 DIAGNOSIS — R001 Bradycardia, unspecified: Secondary | ICD-10-CM | POA: Diagnosis not present

## 2020-02-12 DIAGNOSIS — R269 Unspecified abnormalities of gait and mobility: Secondary | ICD-10-CM | POA: Diagnosis not present

## 2020-02-12 DIAGNOSIS — Z8249 Family history of ischemic heart disease and other diseases of the circulatory system: Secondary | ICD-10-CM | POA: Diagnosis not present

## 2020-02-12 DIAGNOSIS — I499 Cardiac arrhythmia, unspecified: Secondary | ICD-10-CM | POA: Diagnosis not present

## 2020-02-12 DIAGNOSIS — E785 Hyperlipidemia, unspecified: Secondary | ICD-10-CM | POA: Diagnosis not present

## 2020-02-12 DIAGNOSIS — R627 Adult failure to thrive: Secondary | ICD-10-CM | POA: Diagnosis not present

## 2020-02-12 DIAGNOSIS — Z809 Family history of malignant neoplasm, unspecified: Secondary | ICD-10-CM | POA: Diagnosis not present

## 2020-02-13 DIAGNOSIS — R634 Abnormal weight loss: Secondary | ICD-10-CM | POA: Diagnosis not present

## 2020-02-13 DIAGNOSIS — M8008XD Age-related osteoporosis with current pathological fracture, vertebra(e), subsequent encounter for fracture with routine healing: Secondary | ICD-10-CM | POA: Diagnosis not present

## 2020-02-13 DIAGNOSIS — D649 Anemia, unspecified: Secondary | ICD-10-CM | POA: Diagnosis not present

## 2020-02-13 DIAGNOSIS — R627 Adult failure to thrive: Secondary | ICD-10-CM | POA: Diagnosis not present

## 2020-02-13 DIAGNOSIS — R69 Illness, unspecified: Secondary | ICD-10-CM | POA: Diagnosis not present

## 2020-02-13 DIAGNOSIS — R001 Bradycardia, unspecified: Secondary | ICD-10-CM | POA: Diagnosis not present

## 2020-02-13 DIAGNOSIS — I10 Essential (primary) hypertension: Secondary | ICD-10-CM | POA: Diagnosis not present

## 2020-02-13 DIAGNOSIS — E119 Type 2 diabetes mellitus without complications: Secondary | ICD-10-CM | POA: Diagnosis not present

## 2020-02-13 DIAGNOSIS — E785 Hyperlipidemia, unspecified: Secondary | ICD-10-CM | POA: Diagnosis not present

## 2020-02-13 DIAGNOSIS — E039 Hypothyroidism, unspecified: Secondary | ICD-10-CM | POA: Diagnosis not present

## 2020-02-16 ENCOUNTER — Telehealth: Payer: Self-pay

## 2020-02-16 DIAGNOSIS — W19XXXD Unspecified fall, subsequent encounter: Secondary | ICD-10-CM

## 2020-02-16 DIAGNOSIS — Z7982 Long term (current) use of aspirin: Secondary | ICD-10-CM

## 2020-02-16 DIAGNOSIS — R627 Adult failure to thrive: Secondary | ICD-10-CM

## 2020-02-16 DIAGNOSIS — R001 Bradycardia, unspecified: Secondary | ICD-10-CM

## 2020-02-16 DIAGNOSIS — R69 Illness, unspecified: Secondary | ICD-10-CM | POA: Diagnosis not present

## 2020-02-16 DIAGNOSIS — Z681 Body mass index (BMI) 19 or less, adult: Secondary | ICD-10-CM

## 2020-02-16 DIAGNOSIS — F0280 Dementia in other diseases classified elsewhere without behavioral disturbance: Secondary | ICD-10-CM

## 2020-02-16 DIAGNOSIS — E785 Hyperlipidemia, unspecified: Secondary | ICD-10-CM

## 2020-02-16 DIAGNOSIS — E039 Hypothyroidism, unspecified: Secondary | ICD-10-CM

## 2020-02-16 DIAGNOSIS — I10 Essential (primary) hypertension: Secondary | ICD-10-CM

## 2020-02-16 DIAGNOSIS — R634 Abnormal weight loss: Secondary | ICD-10-CM

## 2020-02-16 DIAGNOSIS — E119 Type 2 diabetes mellitus without complications: Secondary | ICD-10-CM

## 2020-02-16 DIAGNOSIS — Z8673 Personal history of transient ischemic attack (TIA), and cerebral infarction without residual deficits: Secondary | ICD-10-CM

## 2020-02-16 DIAGNOSIS — Z9181 History of falling: Secondary | ICD-10-CM

## 2020-02-16 DIAGNOSIS — D649 Anemia, unspecified: Secondary | ICD-10-CM

## 2020-02-16 DIAGNOSIS — M8008XD Age-related osteoporosis with current pathological fracture, vertebra(e), subsequent encounter for fracture with routine healing: Secondary | ICD-10-CM

## 2020-02-16 NOTE — Telephone Encounter (Signed)
Plan of care signed and faxed back to [facility] at [phone]. Form sent for scanning.  

## 2020-02-18 DIAGNOSIS — R001 Bradycardia, unspecified: Secondary | ICD-10-CM | POA: Diagnosis not present

## 2020-02-18 DIAGNOSIS — R627 Adult failure to thrive: Secondary | ICD-10-CM | POA: Diagnosis not present

## 2020-02-18 DIAGNOSIS — E039 Hypothyroidism, unspecified: Secondary | ICD-10-CM | POA: Diagnosis not present

## 2020-02-18 DIAGNOSIS — I10 Essential (primary) hypertension: Secondary | ICD-10-CM | POA: Diagnosis not present

## 2020-02-18 DIAGNOSIS — E785 Hyperlipidemia, unspecified: Secondary | ICD-10-CM | POA: Diagnosis not present

## 2020-02-18 DIAGNOSIS — R634 Abnormal weight loss: Secondary | ICD-10-CM | POA: Diagnosis not present

## 2020-02-18 DIAGNOSIS — E119 Type 2 diabetes mellitus without complications: Secondary | ICD-10-CM | POA: Diagnosis not present

## 2020-02-18 DIAGNOSIS — D649 Anemia, unspecified: Secondary | ICD-10-CM | POA: Diagnosis not present

## 2020-02-18 DIAGNOSIS — M8008XD Age-related osteoporosis with current pathological fracture, vertebra(e), subsequent encounter for fracture with routine healing: Secondary | ICD-10-CM | POA: Diagnosis not present

## 2020-02-18 DIAGNOSIS — R69 Illness, unspecified: Secondary | ICD-10-CM | POA: Diagnosis not present

## 2020-02-19 DIAGNOSIS — E785 Hyperlipidemia, unspecified: Secondary | ICD-10-CM | POA: Diagnosis not present

## 2020-02-19 DIAGNOSIS — M8008XD Age-related osteoporosis with current pathological fracture, vertebra(e), subsequent encounter for fracture with routine healing: Secondary | ICD-10-CM | POA: Diagnosis not present

## 2020-02-19 DIAGNOSIS — R634 Abnormal weight loss: Secondary | ICD-10-CM | POA: Diagnosis not present

## 2020-02-19 DIAGNOSIS — D649 Anemia, unspecified: Secondary | ICD-10-CM | POA: Diagnosis not present

## 2020-02-19 DIAGNOSIS — E119 Type 2 diabetes mellitus without complications: Secondary | ICD-10-CM | POA: Diagnosis not present

## 2020-02-19 DIAGNOSIS — E039 Hypothyroidism, unspecified: Secondary | ICD-10-CM | POA: Diagnosis not present

## 2020-02-19 DIAGNOSIS — R627 Adult failure to thrive: Secondary | ICD-10-CM | POA: Diagnosis not present

## 2020-02-19 DIAGNOSIS — R001 Bradycardia, unspecified: Secondary | ICD-10-CM | POA: Diagnosis not present

## 2020-02-19 DIAGNOSIS — R69 Illness, unspecified: Secondary | ICD-10-CM | POA: Diagnosis not present

## 2020-02-19 DIAGNOSIS — I10 Essential (primary) hypertension: Secondary | ICD-10-CM | POA: Diagnosis not present

## 2020-02-25 ENCOUNTER — Telehealth: Payer: Self-pay | Admitting: Internal Medicine

## 2020-02-25 ENCOUNTER — Encounter: Payer: Self-pay | Admitting: Internal Medicine

## 2020-02-25 ENCOUNTER — Telehealth (INDEPENDENT_AMBULATORY_CARE_PROVIDER_SITE_OTHER): Payer: Medicare HMO | Admitting: Internal Medicine

## 2020-02-25 VITALS — Ht 67.0 in

## 2020-02-25 DIAGNOSIS — Z09 Encounter for follow-up examination after completed treatment for conditions other than malignant neoplasm: Secondary | ICD-10-CM | POA: Diagnosis not present

## 2020-02-25 DIAGNOSIS — M549 Dorsalgia, unspecified: Secondary | ICD-10-CM | POA: Diagnosis not present

## 2020-02-25 NOTE — Progress Notes (Signed)
Subjective:    Patient ID: Amy Weiss, female    DOB: November 13, 1935, 85 y.o.   MRN: [id number]  DOS:  02/25/2020 Type of visit - description: Virtual Visit via Video Note  I connected with the above patient  by a video enabled telemedicine application and verified that I am speaking with the correct person using two identifiers.   THIS ENCOUNTER IS A VIRTUAL VISIT DUE TO COVID-19 - PATIENT WAS NOT SEEN IN THE OFFICE. PATIENT HAS CONSENTED TO VIRTUAL VISIT / TELEMEDICINE VISIT   Location of patient: home  Location of provider: office  I discussed the limitations of evaluation and management by telemedicine and the availability of in person appointments. The patient expressed understanding and agreed to proceed.  Acute Here with her daughter. Until 4 days ago she was doing okay with minimal pain not requiring narcotics. Now, she has low back pain, as intense as 7-10, she took Vicodin today in the morning and she feels better this afternoon. No radiation, no lower extremity paresthesias. No fever chills  Denies any fall.  She did spend several hours 4 days ago sitting in a car on the daughter thinks that may be the reason why she has back pain.    Review of Systems Did have some dysuria today  Past Medical History:  Diagnosis Date  . Diabetes mellitus 4/09   A1C-6  . Familial tremor   . Hyperlipidemia   . Hypothyroidism   . Osteoporosis   . Tachycardia    AV Re-entry, s/p ablation aprox 2005  . TIA (transient ischemic attack) 12/09    Past Surgical History:  Procedure Laterality Date  . ABDOMINAL HYSTERECTOMY     no oophorectomy  . BLADDER SURGERY     x 2 in the 80s  . BREAST BIOPSY     L (-)  . IR KYPHO EA ADDL LEVEL THORACIC OR LUMBAR  12/29/2019  . IR KYPHO LUMBAR INC FX REDUCE BONE BX UNI/BIL CANNULATION INC/IMAGING  01/06/2020  . IR KYPHO THORACIC WITH BONE BIOPSY  12/29/2019  . IR KYPHO THORACIC WITH BONE BIOPSY  02/02/2020    Allergies as of 02/25/2020       Reactions   Sulfonamide Derivatives Other (See Comments)   Skin irritation      Medication List       Accurate as of February 25, 2020 11:59 PM. If you have any questions, ask your nurse or doctor.        alendronate 70 MG tablet Commonly known as: Fosamax Take 1 tablet (70 mg total) by mouth every 7 (seven) days. On Sunday. Take with a full glass of water on an empty stomach.   amLODipine 2.5 MG tablet Commonly known as: NORVASC Take 2.5 mg by mouth daily.   aspirin EC 81 MG tablet Take 81 mg by mouth daily.   diclofenac Sodium 1 % Gel Commonly known as: VOLTAREN Apply 2 g topically 4 (four) times daily.   escitalopram 5 MG tablet Commonly known as: Lexapro Take 1 tablet (5 mg total) by mouth daily.   feeding supplement (ENSURE ENLIVE) Liqd Take 237 mLs by mouth 3 (three) times daily between meals.   HYDROcodone-acetaminophen 5-325 MG tablet Commonly known as: NORCO/VICODIN Take 1-2 tablets by mouth every 6 (six) hours as needed for moderate pain or severe pain.   hydrocortisone 2.5 % cream Apply topically 2 (two) times daily. What changed:   how much to take  when to take this  reasons to take  this   levothyroxine 88 MCG tablet Commonly known as: SYNTHROID Take 1 tablet (88 mcg total) by mouth daily before breakfast.   multivitamin with minerals Tabs tablet Take 1 tablet by mouth daily.   Oscal 500/200 D-3 500-200 MG-UNIT tablet Generic drug: calcium-vitamin D Take 1 tablet by mouth 2 (two) times daily.   OVER THE COUNTER MEDICATION Apply 1 application topically 5 (five) times daily as needed (back pain). "Comfrey Salve"   polyethylene glycol 17 g packet Commonly known as: MIRALAX / GLYCOLAX Take 17 g by mouth daily as needed (constipation).   senna-docusate 8.6-50 MG tablet Commonly known as: Senokot-S Take 2 tablets by mouth 2 (two) times daily as needed for mild constipation or moderate constipation.   Vitamin D (Ergocalciferol) 1.25 MG (50000  UNIT) Caps capsule Commonly known as: DRISDOL Take 1 capsule (50,000 Units total) by mouth every 7 (seven) days.          Objective:   Physical Exam Ht 5\' 7"  (1.702 m)   BMI 21.30 kg/m  This is a virtual video visit, she is alert oriented x3, in no apparent distress.  Cooperative, answers questions appropriately.  Her daughter is also in the visit and provided information.    Assessment        Assessment > Prediabetes   dx 2009 >>>  A1c 6.0 Hypothyroidism TIA 2009 DJD  Osteoporosis per dexa 2008, consistently declined retesting Familial tremor CV: see cardiology at [facility] ---AV reentry tachycardia, ablation 2005 ---Bradycardic , Mobitz I , junctional escapes -- saw cards @ [facility], f/u 1 year, pt decided not to go back  PLAN Back pain, history of lumbar compression fractures She was doing okay until 4 days ago, able to walk with and without a walker and taking showers without major pain. Now the pain is back, the daughter likes to get another kyphoplasty.  Advised that first we need to know what is triggering the pain. Addendum: I discussed the case with Dr. [facility] first step is to get an MRI, if she has a new fracture then she might need a kyphoplasty for which she will need to be admitted.  LM0M to the daughter with this information and asked for a call back Dysuria: Had dysuria again today, recently had the same symptoms and urine culture was negative.  The daughter will call if symptoms persist to recheck another urine analysis and UCX  Time spent 30 minutes coordinating patient care     I discussed the assessment and treatment plan with the patient. The patient was provided an opportunity to ask questions and all were answered. The patient agreed with the plan and demonstrated an understanding of the instructions.   The patient was advised to call back or seek an in-person evaluation if the symptoms worsen or if the condition fails to improve as  anticipated.

## 2020-02-25 NOTE — Progress Notes (Signed)
Pre visit review using our clinic review tool, if applicable. No additional management support is needed unless otherwise documented below in the visit note. 

## 2020-02-25 NOTE — Telephone Encounter (Signed)
Per daughter of patient thinks that she has another fracture, wants patient to get set with Interventional radiology.  With out going to ED.   Please advise

## 2020-02-25 NOTE — Telephone Encounter (Signed)
Spoke w/ Tammy- informed I'm unsure if we can refer to outpatient IR or if that is only inpatient. Informed her of PCP recommendations for virtual visit. Tammy will contact daughter to set up.

## 2020-02-25 NOTE — Telephone Encounter (Signed)
[facility] calling- Please advise.

## 2020-02-25 NOTE — Telephone Encounter (Signed)
I need more details, please set up a virtual visit to see the patient.

## 2020-02-26 ENCOUNTER — Other Ambulatory Visit: Payer: Self-pay | Admitting: Internal Medicine

## 2020-02-26 DIAGNOSIS — R627 Adult failure to thrive: Secondary | ICD-10-CM | POA: Diagnosis not present

## 2020-02-26 DIAGNOSIS — E785 Hyperlipidemia, unspecified: Secondary | ICD-10-CM | POA: Diagnosis not present

## 2020-02-26 DIAGNOSIS — I10 Essential (primary) hypertension: Secondary | ICD-10-CM | POA: Diagnosis not present

## 2020-02-26 DIAGNOSIS — D649 Anemia, unspecified: Secondary | ICD-10-CM | POA: Diagnosis not present

## 2020-02-26 DIAGNOSIS — R001 Bradycardia, unspecified: Secondary | ICD-10-CM | POA: Diagnosis not present

## 2020-02-26 DIAGNOSIS — R69 Illness, unspecified: Secondary | ICD-10-CM | POA: Diagnosis not present

## 2020-02-26 DIAGNOSIS — M8008XD Age-related osteoporosis with current pathological fracture, vertebra(e), subsequent encounter for fracture with routine healing: Secondary | ICD-10-CM | POA: Diagnosis not present

## 2020-02-26 DIAGNOSIS — E039 Hypothyroidism, unspecified: Secondary | ICD-10-CM | POA: Diagnosis not present

## 2020-02-26 DIAGNOSIS — E119 Type 2 diabetes mellitus without complications: Secondary | ICD-10-CM | POA: Diagnosis not present

## 2020-02-26 DIAGNOSIS — R634 Abnormal weight loss: Secondary | ICD-10-CM | POA: Diagnosis not present

## 2020-02-26 NOTE — Assessment & Plan Note (Signed)
Back pain, history of lumbar compression fractures She was doing okay until 4 days ago, able to walk with and without a walker and taking showers without major pain. Now the pain is back, the daughter likes to get another kyphoplasty.  Advised that first we need to know what is triggering the pain. Addendum: I discussed the case with Dr. Victory Dakin, first step is to get an MRI, if she has a new fracture then she might need a kyphoplasty for which she will need to be admitted.  LM0M to the daughter with this information and asked for a call back Dysuria: Had dysuria again today, recently had the same symptoms and urine culture was negative.  The daughter will call if symptoms persist to recheck another urine analysis and UCX

## 2020-02-27 ENCOUNTER — Telehealth: Payer: Self-pay | Admitting: Internal Medicine

## 2020-02-27 DIAGNOSIS — M8008XD Age-related osteoporosis with current pathological fracture, vertebra(e), subsequent encounter for fracture with routine healing: Secondary | ICD-10-CM | POA: Diagnosis not present

## 2020-02-27 DIAGNOSIS — R634 Abnormal weight loss: Secondary | ICD-10-CM | POA: Diagnosis not present

## 2020-02-27 DIAGNOSIS — E039 Hypothyroidism, unspecified: Secondary | ICD-10-CM | POA: Diagnosis not present

## 2020-02-27 DIAGNOSIS — E119 Type 2 diabetes mellitus without complications: Secondary | ICD-10-CM | POA: Diagnosis not present

## 2020-02-27 DIAGNOSIS — I10 Essential (primary) hypertension: Secondary | ICD-10-CM | POA: Diagnosis not present

## 2020-02-27 DIAGNOSIS — R69 Illness, unspecified: Secondary | ICD-10-CM | POA: Diagnosis not present

## 2020-02-27 DIAGNOSIS — R001 Bradycardia, unspecified: Secondary | ICD-10-CM | POA: Diagnosis not present

## 2020-02-27 DIAGNOSIS — E785 Hyperlipidemia, unspecified: Secondary | ICD-10-CM | POA: Diagnosis not present

## 2020-02-27 DIAGNOSIS — R627 Adult failure to thrive: Secondary | ICD-10-CM | POA: Diagnosis not present

## 2020-02-27 DIAGNOSIS — D649 Anemia, unspecified: Secondary | ICD-10-CM | POA: Diagnosis not present

## 2020-02-27 NOTE — Telephone Encounter (Addendum)
Caller/Agency: [id number] Callback Number: [phone] Requesting PT  Frequency:  1 Time  FOR  4 weeks  Verbal Order to Continue  beginning on  April 12

## 2020-02-27 NOTE — Telephone Encounter (Signed)
Spoke w/ Cecilia- verbal orders given.  

## 2020-03-01 ENCOUNTER — Telehealth: Payer: Self-pay | Admitting: Internal Medicine

## 2020-03-01 NOTE — Telephone Encounter (Signed)
Spoke w/ Dorathy Daft- verbal orders given.

## 2020-03-01 NOTE — Telephone Encounter (Signed)
  Kayla[facility]  [phone]  Requesting OT   1 TIME A WEEK FOR 2 WEEKS

## 2020-03-03 ENCOUNTER — Telehealth: Payer: Self-pay

## 2020-03-03 NOTE — Telephone Encounter (Signed)
Hospice plan of care signed and mailed back in envelope provided. Copy of forms sent for scanning.

## 2020-03-03 NOTE — Telephone Encounter (Signed)
Forms signed and faxed back to [facility] at [phone]. Forms sent for scanning.

## 2020-03-04 ENCOUNTER — Telehealth: Payer: Self-pay | Admitting: Internal Medicine

## 2020-03-04 NOTE — Telephone Encounter (Signed)
Spoke w/ Tobi Bastos- verbal orders given for UA, urine culture. Informed PCP will be out of office until Wednesday next week- she will have results forwarded to one of her providers for treatment if + for UTI.

## 2020-03-04 NOTE — Telephone Encounter (Signed)
Please advise 

## 2020-03-04 NOTE — Telephone Encounter (Signed)
Caller : Tobi Bastos -[facility] Call Back # [phone]  Subject: Urinary Tract   Per Thurston Hole, patient is having burning when see urinates. Per Tobi Bastos would [place] like to a UA on patient.  Please advise

## 2020-03-04 NOTE — Telephone Encounter (Signed)
Obtain a UA and urine culture. Be aware I will not be here in the next few days, when they obtain the results recommend to call the office and talk with one of the nurses about them. If he has fever, chills, or or severe symptoms: Needs to be seen

## 2020-03-05 ENCOUNTER — Encounter: Payer: Self-pay | Admitting: Internal Medicine

## 2020-03-05 ENCOUNTER — Telehealth: Payer: Self-pay | Admitting: Internal Medicine

## 2020-03-05 DIAGNOSIS — Z008 Encounter for other general examination: Secondary | ICD-10-CM | POA: Diagnosis not present

## 2020-03-05 NOTE — Telephone Encounter (Signed)
Last written: 02/03/20 Last ov: 02/25/20 Next ov: none Contract: none UDS: none

## 2020-03-05 NOTE — Telephone Encounter (Signed)
     Medication: HYDROcodone-acetaminophen (NORCO/VICODIN) 5-325 MG tablet [[id number]]   Has the patient contacted their pharmacy? No. (If no, request that the patient contact the pharmacy for the refill.) (If yes, when and what did the pharmacy advise?)  Preferred Pharmacy (with phone number or street name): [phone] [place], [place] - [address]  [address] [place] [address]  Phone:  [phone] Fax:  [phone]   Agent: Please be advised that RX refills may take up to 3 business days. We ask that you follow-up with your pharmacy.Marland Kitchen

## 2020-03-08 MED ORDER — HYDROCODONE-ACETAMINOPHEN 5-325 MG PO TABS: 1.0000 | ORAL_TABLET | Freq: Three times a day (TID) | ORAL | 0 refills | Status: DC | PRN

## 2020-03-08 NOTE — Telephone Encounter (Signed)
Received faxed UA and urine culture results from [facility]. Needing plan and medication to treat. LMOM for Amy Weiss informing that I spoke w/ Tobi Bastos on 03/04/2020 and informed her that PCP is out of the office until 4/21- she informed me that she would have one of the providers there treat Pt if she had a UTI. Instructed Raynelle Fanning to call back if questions/concerns.

## 2020-03-08 NOTE — Telephone Encounter (Signed)
Alinda Sierras Called again to make sure script is filled. Ms. Gruner will be out of this med as of today. Thanks

## 2020-03-08 NOTE — Telephone Encounter (Signed)
PDMP okay, please let them know I sent the prescription, sig change to every 8 hours

## 2020-03-09 ENCOUNTER — Telehealth: Payer: Self-pay

## 2020-03-09 NOTE — Telephone Encounter (Signed)
Amy Weiss, Amy Weiss routed conversation to [place] Just now (11:14 AM)  Amy Weiss, Amy Weiss Just now (11:14 AM)  [facility]   Raynelle Fanning from [facility] called in to report  That the patient Urine sample came back as a uti and the patient will be taking the following medication (MACROBID 100mg )  Twice a day for 7 days    If there is any question please call at [phone] Okay to leave a message.       Documentation

## 2020-03-09 NOTE — Telephone Encounter (Signed)
Noted- results and Abx information placed in Amy Weiss's red folder for review once he returns to office tomorrow.

## 2020-03-09 NOTE — Telephone Encounter (Signed)
Raynelle Fanning from [facility] called in to report  That the patient Urine sample came back as a uti and the patient will be taking the following medication (MACROBID 100mg )  Twice a day for 7 days   If there is any question please call at [phone] Okay to leave a message.

## 2020-03-09 NOTE — Telephone Encounter (Signed)
Noted. Results in Paz's red folder for review when he returns to office.

## 2020-03-11 ENCOUNTER — Telehealth: Payer: Self-pay

## 2020-03-11 NOTE — Telephone Encounter (Signed)
PA approved. Effective 11/21/2019 to 11/19/2020. 

## 2020-03-12 NOTE — Telephone Encounter (Signed)
Reviewed urine culture, + E. coli, sensitive to nitrofurantoin. Needs at least 1 week of treatment with Macrobid We will forward this note to [place], [place]

## 2020-03-15 ENCOUNTER — Telehealth: Payer: Self-pay

## 2020-03-15 NOTE — Telephone Encounter (Signed)
PA form received, completed and faxed back to [facility] at [phone]. Awaiting determination.

## 2020-03-15 NOTE — Telephone Encounter (Addendum)
PA form completed and faxed back to [facility] at [phone]. PA approved. Effective 11/21/2019 to 11/19/2020.

## 2020-03-16 NOTE — Telephone Encounter (Signed)
PA approved. Effective 11/21/19 to 11/19/2020.  

## 2020-03-17 ENCOUNTER — Telehealth: Payer: Self-pay

## 2020-03-17 NOTE — Telephone Encounter (Signed)
Spoke w/ Tobi Bastos at [facility] [phone])-- they are requesting a gel top mattress through insurance d/t high risk of pressure ulcers. Informed she'd need an appt for appropriate documentation for Medicare to potentially cover. Tobi Bastos verbalized understanding- she will discuss w/ [name].

## 2020-03-19 ENCOUNTER — Telehealth (INDEPENDENT_AMBULATORY_CARE_PROVIDER_SITE_OTHER): Payer: Medicare HMO | Admitting: Internal Medicine

## 2020-03-19 ENCOUNTER — Encounter: Payer: Self-pay | Admitting: Internal Medicine

## 2020-03-19 ENCOUNTER — Other Ambulatory Visit: Payer: Self-pay

## 2020-03-19 VITALS — BP 110/50

## 2020-03-19 DIAGNOSIS — L89152 Pressure ulcer of sacral region, stage 2: Secondary | ICD-10-CM | POA: Diagnosis not present

## 2020-03-19 DIAGNOSIS — M8000XS Age-related osteoporosis with current pathological fracture, unspecified site, sequela: Secondary | ICD-10-CM | POA: Diagnosis not present

## 2020-03-19 DIAGNOSIS — S22070G Wedge compression fracture of T9-T10 vertebra, subsequent encounter for fracture with delayed healing: Secondary | ICD-10-CM

## 2020-03-19 DIAGNOSIS — R69 Illness, unspecified: Secondary | ICD-10-CM | POA: Diagnosis not present

## 2020-03-19 DIAGNOSIS — F0390 Unspecified dementia without behavioral disturbance: Secondary | ICD-10-CM | POA: Diagnosis not present

## 2020-03-19 DIAGNOSIS — S32020A Wedge compression fracture of second lumbar vertebra, initial encounter for closed fracture: Secondary | ICD-10-CM

## 2020-03-19 NOTE — Progress Notes (Signed)
Subjective:    Patient ID: Amy Weiss, female    DOB: 12/05/1934, 85 y.o.   MRN: [id number]  DOS:  03/19/2020 Type of visit - description: Virtual Visit via Video Note  I connected with the above patient  by a video enabled telemedicine application and verified that I am speaking with the correct person using two identifiers.   THIS ENCOUNTER IS A VIRTUAL VISIT DUE TO COVID-19 - PATIENT WAS NOT SEEN IN THE OFFICE. PATIENT HAS CONSENTED TO VIRTUAL VISIT / TELEMEDICINE VISIT   Location of patient: home  Location of provider: office  I discussed the limitations of evaluation and management by telemedicine and the availability of in person appointments. The patient expressed understanding and agreed to proceed.  Acute The patient's daughter is concerned because the patient is not mobile and has developed bedsore at the coccyx. Started as redness  and now is a very shallow open sore. No fever chills No discharge.  On further questioning, she stays in bed approximately 22 hours a day.  She is able to ambulate very little around her house going to the bathroom etc.  Medication list reviewed.  Back pain: See last visit, pain has improved.   Review of Systems See above   Past Medical History:  Diagnosis Date  . Diabetes mellitus 4/09   A1C-6  . Familial tremor   . Hyperlipidemia   . Hypothyroidism   . Osteoporosis   . Tachycardia    AV Re-entry, s/p ablation aprox 2005  . TIA (transient ischemic attack) 12/09    Past Surgical History:  Procedure Laterality Date  . ABDOMINAL HYSTERECTOMY     no oophorectomy  . BLADDER SURGERY     x 2 in the 80s  . BREAST BIOPSY     L (-)  . IR KYPHO EA ADDL LEVEL THORACIC OR LUMBAR  12/29/2019  . IR KYPHO LUMBAR INC FX REDUCE BONE BX UNI/BIL CANNULATION INC/IMAGING  01/06/2020  . IR KYPHO THORACIC WITH BONE BIOPSY  12/29/2019  . IR KYPHO THORACIC WITH BONE BIOPSY  02/02/2020    Allergies as of 03/19/2020      Reactions   Sulfonamide  Derivatives Other (See Comments)   Skin irritation      Medication List       Accurate as of March 19, 2020  2:34 PM. If you have any questions, ask your nurse or doctor.        alendronate 70 MG tablet Commonly known as: Fosamax Take 1 tablet (70 mg total) by mouth every 7 (seven) days. On Sunday. Take with a full glass of water on an empty stomach.   amLODipine 2.5 MG tablet Commonly known as: NORVASC Take 2.5 mg by mouth daily.   aspirin EC 81 MG tablet Take 81 mg by mouth daily.   diclofenac Sodium 1 % Gel Commonly known as: VOLTAREN Apply 2 g topically 4 (four) times daily.   escitalopram 5 MG tablet Commonly known as: LEXAPRO Take 1 tablet (5 mg total) by mouth daily.   feeding supplement (ENSURE ENLIVE) Liqd Take 237 mLs by mouth 3 (three) times daily between meals.   HYDROcodone-acetaminophen 5-325 MG tablet Commonly known as: NORCO/VICODIN Take 1-2 tablets by mouth every 8 (eight) hours as needed for moderate pain or severe pain.   hydrocortisone 2.5 % cream Apply topically 2 (two) times daily. What changed:   how much to take  when to take this  reasons to take this   levothyroxine 88 MCG  tablet Commonly known as: SYNTHROID Take 1 tablet (88 mcg total) by mouth daily before breakfast.   multivitamin with minerals Tabs tablet Take 1 tablet by mouth daily.   Oscal 500/200 D-3 500-200 MG-UNIT tablet Generic drug: calcium-vitamin D Take 1 tablet by mouth 2 (two) times daily.   OVER THE COUNTER MEDICATION Apply 1 application topically 5 (five) times daily as needed (back pain). "Comfrey Salve"   polyethylene glycol 17 g packet Commonly known as: MIRALAX / GLYCOLAX Take 17 g by mouth daily as needed (constipation).   senna-docusate 8.6-50 MG tablet Commonly known as: Senokot-S Take 2 tablets by mouth 2 (two) times daily as needed for mild constipation or moderate constipation.   Vitamin D (Ergocalciferol) 1.25 MG (50000 UNIT) Caps  capsule Commonly known as: DRISDOL Take 1 capsule (50,000 Units total) by mouth every 7 (seven) days.          Objective:   Physical Exam There were no vitals taken for this visit. This is a virtual video visit, she is alert oriented x3, does not seem in distress.    Assessment     Assessment > Prediabetes   dx 2009 >>>  A1c 6.0 Hypothyroidism TIA 2009 DJD  Osteoporosis per dexa 2008, consistently declined retesting Familial tremor CV: see cardiology at [facility] ---AV reentry tachycardia, ablation 2005 ---Bradycardic , Mobitz I , junctional escapes -- saw cards @ [facility], f/u 1 year, pt decided not to go back   PLAN Pressure ulcers:  The patient has developed a pressure ulcer, see HPI, recommend frequent turning, adopt different positions to prevent worsening. There taking care of the area with OTC creams. She also needs often times to be seated at 45 degrees (see comments under osteoporosis).   This is not possible with a normal bed thus rec a hospital bed Also recommend mattress overlay and sheepskin. Extensive paperwork filled HTN?  BP today is very good, she is not taking amlodipine. Back pain, history of lumbar compression fracture: See last visit, they never pursue a MRI, pain is currently about 3/10, on Tylenol and hydrocodone. Osteoporosis: On Fosamax but unable to take because she cannot see it upright for too long.  Hopefully hospital bed could help.  We will not be able to provide Reclast or Prolia because she is  homebound.  Time spent: 35 minutes advising about management of pressure ulcers and completing paperwork.  I discussed the assessment and treatment plan with the patient. The patient was provided an opportunity to ask questions and all were answered. The patient agreed with the plan and demonstrated an understanding of the instructions.   The patient was advised to call back or seek an in-person evaluation if the symptoms worsen or if the condition  fails to improve as anticipated.

## 2020-03-19 NOTE — Progress Notes (Signed)
Pre visit review using our clinic review tool, if applicable. No additional management support is needed unless otherwise documented below in the visit note. 

## 2020-03-19 NOTE — Progress Notes (Signed)
Faxed hospital bed orders to [facility].

## 2020-03-20 NOTE — Assessment & Plan Note (Signed)
Pressure ulcers:  The patient has developed a pressure ulcer, see HPI, recommend frequent turning, adopt different positions to prevent worsening. There taking care of the area with OTC creams. She also needs often times to be seated at 45 degrees (see comments under osteoporosis).   This is not possible with a normal bed thus rec a hospital bed Also recommend mattress overlay and sheepskin. Extensive paperwork filled HTN?  BP today is very good, she is not taking amlodipine. Back pain, history of lumbar compression fracture: See last visit, they never pursue a MRI, pain is currently about 3/10, on Tylenol and hydrocodone. Osteoporosis: On Fosamax but unable to take because she cannot see it upright for too long.  Hopefully hospital bed could help.  We will not be able to provide Reclast or Prolia because she is  homebound.

## 2020-03-23 DIAGNOSIS — E119 Type 2 diabetes mellitus without complications: Secondary | ICD-10-CM | POA: Diagnosis not present

## 2020-03-23 DIAGNOSIS — D649 Anemia, unspecified: Secondary | ICD-10-CM | POA: Diagnosis not present

## 2020-03-23 DIAGNOSIS — R001 Bradycardia, unspecified: Secondary | ICD-10-CM | POA: Diagnosis not present

## 2020-03-23 DIAGNOSIS — R634 Abnormal weight loss: Secondary | ICD-10-CM | POA: Diagnosis not present

## 2020-03-23 DIAGNOSIS — R627 Adult failure to thrive: Secondary | ICD-10-CM | POA: Diagnosis not present

## 2020-03-23 DIAGNOSIS — R69 Illness, unspecified: Secondary | ICD-10-CM | POA: Diagnosis not present

## 2020-03-23 DIAGNOSIS — E039 Hypothyroidism, unspecified: Secondary | ICD-10-CM | POA: Diagnosis not present

## 2020-03-23 DIAGNOSIS — M8008XD Age-related osteoporosis with current pathological fracture, vertebra(e), subsequent encounter for fracture with routine healing: Secondary | ICD-10-CM | POA: Diagnosis not present

## 2020-03-23 DIAGNOSIS — I10 Essential (primary) hypertension: Secondary | ICD-10-CM | POA: Diagnosis not present

## 2020-03-23 DIAGNOSIS — E785 Hyperlipidemia, unspecified: Secondary | ICD-10-CM | POA: Diagnosis not present

## 2020-03-24 DIAGNOSIS — S22000A Wedge compression fracture of unspecified thoracic vertebra, initial encounter for closed fracture: Secondary | ICD-10-CM | POA: Diagnosis not present

## 2020-04-11 ENCOUNTER — Other Ambulatory Visit: Payer: Self-pay | Admitting: Internal Medicine

## 2020-04-16 ENCOUNTER — Telehealth: Payer: Self-pay | Admitting: Internal Medicine

## 2020-04-16 NOTE — Telephone Encounter (Signed)
Caller: Tobi Bastos ([facility]) Call back phone [phone]  Tobi Bastos from [facility] is callied to notify you patient has lost 3lbs in the last month and half. She would like to know if is ok for the patient to start taking ensure.

## 2020-04-20 NOTE — Telephone Encounter (Signed)
Yes, is okay

## 2020-04-20 NOTE — Telephone Encounter (Signed)
Please advise 

## 2020-04-20 NOTE — Telephone Encounter (Signed)
Spoke w/ Tobi Bastos- verbal given.

## 2020-04-23 ENCOUNTER — Telehealth: Payer: Self-pay

## 2020-04-23 NOTE — Telephone Encounter (Signed)
Advise: As long as her thyroid is well-balanced she should not have any problem. She can develop osteoporosis with over or under treatment of thyroid disease. Her last TSH okay, continue Synthroid.

## 2020-04-23 NOTE — Telephone Encounter (Signed)
Received call from Tobi Bastos at [facility]- she received a call from [name] [name]- she was reviewing her moms medications and saw that Synthroid caused osteoporosis- [name] wanted to know if there is any other medications that she can take in place of this that does not have the osteoporosis side effect. Tobi Bastos can be reached at [phone].

## 2020-04-23 NOTE — Telephone Encounter (Signed)
Spoke w/ Tobi Bastos- informed of recommendations. Tobi Bastos verbalized understanding.

## 2020-04-24 DIAGNOSIS — S22000A Wedge compression fracture of unspecified thoracic vertebra, initial encounter for closed fracture: Secondary | ICD-10-CM | POA: Diagnosis not present

## 2020-05-13 ENCOUNTER — Telehealth: Payer: Self-pay

## 2020-05-13 NOTE — Telephone Encounter (Signed)
Spoke w/ Tobi Bastos from [facility]--she saw Pt today- [name] would like her sent to neurology for evaluation as to what type of dementia she has. They are requesting a Dr. Maryfrances Bunnell (whom is a hospitalist I believe).

## 2020-05-13 NOTE — Telephone Encounter (Signed)
Hospice orders received, signed and mailed back to [facility] in envelope provided. Copy of orders sent for scanning.

## 2020-05-14 NOTE — Telephone Encounter (Signed)
Spoke with Sue Lush, palliative care nurse. She confirms that the patient is often confused, poor short-term memory, velocity of the decline not known. She is a still somewhat belligerent with her daughter. Physically, she is doing better, able to walk with a walker.  She is not bedridden.  Please call patient's daughter, often time defining the type of dementia does not affect the treatment which will be Aricept. -If she would like to be seen by neurology before we do anything, then  arrange an outpatient referral. -If she elects not to, we could start Aricept 5 mg daily and have her come to this office within a month.

## 2020-05-14 NOTE — Telephone Encounter (Signed)
LMOM for [name], [name]- informed reason for call and recommendations. Instructed her to call and let us know what she would like to do.

## 2020-05-24 DIAGNOSIS — S22000A Wedge compression fracture of unspecified thoracic vertebra, initial encounter for closed fracture: Secondary | ICD-10-CM | POA: Diagnosis not present

## 2020-06-24 DIAGNOSIS — S22000A Wedge compression fracture of unspecified thoracic vertebra, initial encounter for closed fracture: Secondary | ICD-10-CM | POA: Diagnosis not present

## 2020-07-25 DIAGNOSIS — S22000A Wedge compression fracture of unspecified thoracic vertebra, initial encounter for closed fracture: Secondary | ICD-10-CM | POA: Diagnosis not present

## 2020-07-28 ENCOUNTER — Other Ambulatory Visit: Payer: Self-pay | Admitting: Internal Medicine

## 2020-08-24 DIAGNOSIS — S22000A Wedge compression fracture of unspecified thoracic vertebra, initial encounter for closed fracture: Secondary | ICD-10-CM | POA: Diagnosis not present

## 2020-08-25 ENCOUNTER — Telehealth: Payer: Self-pay | Admitting: Internal Medicine

## 2020-08-25 NOTE — Telephone Encounter (Signed)
Caller name: Tammy ([facility]) Call back [phone]  Daughter states the patient is complaining of burn with urination and back pain. Tammy would like an order to collection urine specimen for a UA & culture.

## 2020-08-25 NOTE — Telephone Encounter (Signed)
Please advise 

## 2020-08-25 NOTE — Telephone Encounter (Signed)
Okay to order, DX UTI

## 2020-08-25 NOTE — Telephone Encounter (Signed)
Spoke w/ Tammy- verbal orders given.

## 2020-08-26 ENCOUNTER — Encounter: Payer: Self-pay | Admitting: Internal Medicine

## 2020-08-26 DIAGNOSIS — N390 Urinary tract infection, site not specified: Secondary | ICD-10-CM | POA: Diagnosis not present

## 2020-08-27 ENCOUNTER — Telehealth: Payer: Self-pay | Admitting: Internal Medicine

## 2020-08-27 MED ORDER — CIPROFLOXACIN HCL 500 MG PO TABS: 500.0000 mg | ORAL_TABLET | Freq: Two times a day (BID) | ORAL | 0 refills | Status: DC

## 2020-08-27 NOTE — Telephone Encounter (Signed)
Spoke w/ Tammy- informed that we have not received results. She will resend again but results below:   +Nitrates 3+ leukos 10-20 wbc 6-10 squamous epithelial cells Moderate bacteria Moderate amorphous sediment

## 2020-08-27 NOTE — Telephone Encounter (Signed)
Culture is pending.

## 2020-08-27 NOTE — Telephone Encounter (Signed)
Start empiric treatment with ciprofloxacin 500 mg, 1 p.o. twice daily #10 no refills.Marland Kitchen

## 2020-08-27 NOTE — Telephone Encounter (Signed)
Per Tammy-Hospice  Call Back : [phone]  Per Tammy, they are send [place] a fax with patient lab results.

## 2020-08-27 NOTE — Addendum Note (Signed)
Addended byConrad Dayton D on: 08/27/2020 04:59 PM   Modules accepted: Orders

## 2020-08-27 NOTE — Telephone Encounter (Signed)
LMOM informing [name] [name] of urine results and recommendatons. [facility]

## 2020-08-30 MED ORDER — AMOXICILLIN-POT CLAVULANATE 500-125 MG PO TABS: 1.0000 | ORAL_TABLET | Freq: Three times a day (TID) | ORAL | 0 refills | Status: DC

## 2020-08-30 NOTE — Telephone Encounter (Signed)
Caller : Alinda Sierras  Call Back # [phone]  Patient's daughter states patient can not take   ciprofloxacin (CIPRO) 500 MG tablet   [name] is requesting another medication for patient be sent to pharmacy   Please Advise

## 2020-08-30 NOTE — Telephone Encounter (Addendum)
Spoke w/ [name]- informed of recommendations. She wanted [place] to make a note in the chart that Pt has dizziness with Cipro and she is alone at night. She would rather her have a "virus" then fall d/t dizziness with Cipro and have to go to ED.

## 2020-08-30 NOTE — Telephone Encounter (Signed)
Daughter worried about side effects for Cipro. Such as tendonitis.

## 2020-08-30 NOTE — Addendum Note (Signed)
Addended byConrad [place] D on: 08/30/2020 12:44 PM   Modules accepted: Orders

## 2020-08-30 NOTE — Telephone Encounter (Signed)
At this point, the patient is homebound, if needed I will insist on Cipro. She is allergic to sulfa For now recommend Augmentin 500 mg 1 p.o. 3 times daily #15 no refills.

## 2020-08-30 NOTE — Telephone Encounter (Signed)
Noted  

## 2020-08-31 ENCOUNTER — Telehealth: Payer: Self-pay | Admitting: Internal Medicine

## 2020-08-31 NOTE — Telephone Encounter (Signed)
Spoke w/ [name]- [name]- informed of results and recommendations. [name] verbalized understanding.

## 2020-08-31 NOTE — Telephone Encounter (Signed)
UCX: E. coli, greater than 100 K CFU/mL, sensitive to Augmentin.  Let the patient's family know: Urine culture positive, finish Augmentin.

## 2020-09-03 ENCOUNTER — Telehealth: Payer: Self-pay

## 2020-09-03 NOTE — Telephone Encounter (Signed)
Hospice orders received, signed and mailed back in envelope provided. Copy of form sent for scanning.

## 2020-09-16 ENCOUNTER — Telehealth: Payer: Self-pay | Admitting: Internal Medicine

## 2020-09-16 NOTE — Telephone Encounter (Signed)
Okay to schedule physical w/ Drue Novel, she will need to come into the office for that.

## 2020-09-16 NOTE — Telephone Encounter (Signed)
Patient daughter states mom needs a physical. Daughter stated if he is not able to do it is ok because we have two doctors. Daughter states can we just get the blood work?

## 2020-09-17 NOTE — Telephone Encounter (Signed)
Please advise 

## 2020-09-17 NOTE — Telephone Encounter (Signed)
Dr. Drue Novel does not have any available morning appointment for the remainder of the year. The daughter is asking if she could just get blood work.

## 2020-09-17 NOTE — Telephone Encounter (Signed)
Amy Weiss, could you clarify this for me? I am not sure if she needs a physical however if she is homebound, I am willing to do virtual visit. If she needs blood work I am not sure how she is going to get it if she is homebound. Is she under hospice care?

## 2020-09-17 NOTE — Telephone Encounter (Signed)
LM with daughter, [name], requesting call back.

## 2020-09-21 NOTE — Telephone Encounter (Signed)
Spoke with patients daughter, [name].  [name] states patient is not bed bound, but does have difficulty in vehicles as well as sitting/standing for extended periods of time.  Patient is managed by [facility] [place]). RN comes to home every other week for assessment.  Explained [facility] will be contacted to determine if they can obtain labs for CPE.   Virtual CPE scheduled for November 15th, daughter to be present for visit.   Contacted [facility], Tammy Patterson Sales promotion account executive) to return call with information on obtaining labs for PCP.

## 2020-09-22 ENCOUNTER — Telehealth: Payer: Self-pay | Admitting: Internal Medicine

## 2020-09-22 NOTE — Telephone Encounter (Signed)
Okay to recheck UA, urine culture, DX UTI. If severe symptoms, high fever: Consider ER.

## 2020-09-22 NOTE — Telephone Encounter (Signed)
Please advise 

## 2020-09-22 NOTE — Telephone Encounter (Signed)
Spoke with Tammy at [facility] regarding lab work.  Tammy states they can obtain labs for PCP.  Orders can be called to her (or left on voicemail) at [phone] or faxed to [phone].    LM with daughter requesting call back to inform of above.

## 2020-09-22 NOTE — Telephone Encounter (Signed)
LMOM for Tammy w/ verbal orders.

## 2020-09-22 NOTE — Telephone Encounter (Signed)
Amy Weiss ( [facility]) [phone]  Daughter told Tammy patient has increased confusion wants to know if they could get a urine sample

## 2020-09-23 ENCOUNTER — Encounter: Payer: Self-pay | Admitting: Internal Medicine

## 2020-09-23 DIAGNOSIS — N390 Urinary tract infection, site not specified: Secondary | ICD-10-CM | POA: Diagnosis not present

## 2020-09-24 DIAGNOSIS — S22000A Wedge compression fracture of unspecified thoracic vertebra, initial encounter for closed fracture: Secondary | ICD-10-CM | POA: Diagnosis not present

## 2020-09-24 NOTE — Telephone Encounter (Signed)
+   Esterase, no WBCs or RBCs.  Wait for culture.

## 2020-09-24 NOTE — Telephone Encounter (Signed)
Received UA results.   1+ leukocytes  Urine culture pending.   Results placed in PCP red folder.

## 2020-09-27 NOTE — Telephone Encounter (Signed)
Advise family: Urine culture negative, she does not have a UTI. Mental status changes may be from something else, watch for fever, chills, cough, skin infections, constipation, uncontrolled pain, etc.

## 2020-09-27 NOTE — Telephone Encounter (Signed)
Received urine culture results. Placed in PCP red folder.

## 2020-09-28 NOTE — Telephone Encounter (Signed)
LMOM informing Tammy at Hospice of PCP recommendations below. Instructed to call if questions/concerns.

## 2020-10-04 ENCOUNTER — Telehealth: Payer: Medicare HMO | Admitting: Internal Medicine

## 2020-10-11 ENCOUNTER — Other Ambulatory Visit: Payer: Self-pay

## 2020-10-11 ENCOUNTER — Ambulatory Visit (INDEPENDENT_AMBULATORY_CARE_PROVIDER_SITE_OTHER): Payer: Medicare HMO | Admitting: Internal Medicine

## 2020-10-11 ENCOUNTER — Encounter: Payer: Self-pay | Admitting: Internal Medicine

## 2020-10-11 VITALS — BP 150/70 | HR 38 | Temp 97.4°F | Ht 67.0 in | Wt 139.4 lb

## 2020-10-11 DIAGNOSIS — R739 Hyperglycemia, unspecified: Secondary | ICD-10-CM

## 2020-10-11 DIAGNOSIS — M8000XS Age-related osteoporosis with current pathological fracture, unspecified site, sequela: Secondary | ICD-10-CM

## 2020-10-11 DIAGNOSIS — Z23 Encounter for immunization: Secondary | ICD-10-CM | POA: Diagnosis not present

## 2020-10-11 DIAGNOSIS — E039 Hypothyroidism, unspecified: Secondary | ICD-10-CM | POA: Diagnosis not present

## 2020-10-11 DIAGNOSIS — Z0000 Encounter for general adult medical examination without abnormal findings: Secondary | ICD-10-CM | POA: Diagnosis not present

## 2020-10-11 DIAGNOSIS — E785 Hyperlipidemia, unspecified: Secondary | ICD-10-CM

## 2020-10-11 DIAGNOSIS — Z8673 Personal history of transient ischemic attack (TIA), and cerebral infarction without residual deficits: Secondary | ICD-10-CM

## 2020-10-11 LAB — CBC WITH DIFFERENTIAL/PLATELET
Basophils Absolute: 0 10*3/uL (ref 0.0–0.1)
Basophils Relative: 0.8 % (ref 0.0–3.0)
Eosinophils Absolute: 0.2 10*3/uL (ref 0.0–0.7)
Eosinophils Relative: 3.1 % (ref 0.0–5.0)
HCT: 40.2 % (ref 36.0–46.0)
Hemoglobin: 13.2 g/dL (ref 12.0–15.0)
Lymphocytes Relative: 28.5 % (ref 12.0–46.0)
Lymphs Abs: 1.6 10*3/uL (ref 0.7–4.0)
MCHC: 32.8 g/dL (ref 30.0–36.0)
MCV: 86.9 fl (ref 78.0–100.0)
Monocytes Absolute: 0.8 10*3/uL (ref 0.1–1.0)
Monocytes Relative: 14.5 % — ABNORMAL HIGH (ref 3.0–12.0)
Neutro Abs: 2.9 10*3/uL (ref 1.4–7.7)
Neutrophils Relative %: 53.1 % (ref 43.0–77.0)
Platelets: 241 10*3/uL (ref 150.0–400.0)
RBC: 4.62 Mil/uL (ref 3.87–5.11)
RDW: 15 % (ref 11.5–15.5)
WBC: 5.5 10*3/uL (ref 4.0–10.5)

## 2020-10-11 LAB — COMPREHENSIVE METABOLIC PANEL
ALT: 9 U/L (ref 0–35)
AST: 20 U/L (ref 0–37)
Albumin: 3.7 g/dL (ref 3.5–5.2)
Alkaline Phosphatase: 119 U/L — ABNORMAL HIGH (ref 39–117)
BUN: 20 mg/dL (ref 6–23)
CO2: 29 mEq/L (ref 19–32)
Calcium: 8.9 mg/dL (ref 8.4–10.5)
Chloride: 105 mEq/L (ref 96–112)
Creatinine, Ser: 0.75 mg/dL (ref 0.40–1.20)
GFR: 73.8 mL/min (ref >60.00)
Glucose, Bld: 117 mg/dL — ABNORMAL HIGH (ref 70–99)
Potassium: 4.4 mEq/L (ref 3.5–5.1)
Sodium: 139 mEq/L (ref 135–145)
Total Bilirubin: 0.6 mg/dL (ref 0.2–1.2)
Total Protein: 6.9 g/dL (ref 6.0–8.3)

## 2020-10-11 LAB — HEMOGLOBIN A1C: Hgb A1c MFr Bld: 5.7 % (ref 4.6–6.5)

## 2020-10-11 LAB — TSH: TSH: 1.35 u[IU]/mL (ref 0.35–4.50)

## 2020-10-11 NOTE — Assessment & Plan Note (Signed)
Here for a CPX Prediabetes: Checking A1c.  In general doing well, she lost weight earlier this year, she is getting back. Thyroid disease: on synthroid,  checking labs Elevated BP: BP slightly elevated today, at home is consistently normal. TIA, remote, continue aspirin?  We agreed to continue with aspirin Statins: she is very clear , won't take them thus no need for FLP Vertebral fracture:  Had a vertebral fracture on 12-2019, she was essentially bedridden for a while.  Currently improved.  Pain is not a major issue but will leave hydrocodone on the med list in case she needs it. Osteoporosis: Rx Fosamax 01-2020, the family don't think she will be able to stay sitting for 30 minutes, with talk about other options including  Prolia, she will only accept if that is provided at home by a [facility] agency (after sun investigation that is not possible for thus will not pursue meds). Also not taking calcium- vitamin D, she has a tendency to be constipated.  Recommend to at least take vitamin D 2000 units Dementia: Earlier this year her short-term memory got much worse, she has dementia, will do more detailed exam when she comes back.  Aricept, Namenda?  Family declined. Pressure ulcers: Developed pressure ulcer since last visit, the resolved, still has a hospital bed & gel overlay  Social: Lives herself, family is quite supportive, every day she has somebody with her, they arrange her medicines. RTC 6 months

## 2020-10-11 NOTE — Progress Notes (Signed)
Pre visit review using our clinic review tool, if applicable. No additional management support is needed unless otherwise documented below in the visit note. 

## 2020-10-11 NOTE — Assessment & Plan Note (Signed)
-  Td  2008 -COVID vaccine x2, last 05-2020, recommend booster 11-2020. - prevnar today - Flu shot today -We will discuss further immunizations at the next opportunity -No further colon-breast-cervical ca screening, see previous entries. -Labs: CMP, FLP, CBC,TSH, A1c,

## 2020-10-11 NOTE — Progress Notes (Signed)
Subjective:    Patient ID: Amy Weiss, female    DOB: 04-27-1935, 85 y.o.   MRN: [id number]  DOS:  10/11/2020 Type of visit - description: Request CPX, here with her daughter. Last visit was April, virtually.    Since then, hospice has been involved in her care. Had a UTI treated, now asymptomatic.  Wt Readings from Last 3 Encounters:  10/11/20 139 lb 6.4 oz (63.2 kg)  02/04/20 136 lb (61.7 kg)  01/30/20 136 lb 11 oz (62 kg)     Review of Systems Since the last office visit she is doing well. More mobile. Pain is not a major issue, very rarely takes hydrocodone.  Other than above, a 14 point review of systems is negative      Past Medical History:  Diagnosis Date  . Diabetes mellitus 4/09   A1C-6  . Familial tremor   . Hyperlipidemia   . Hypothyroidism   . Osteoporosis   . Tachycardia    AV Re-entry, s/p ablation aprox 2005  . TIA (transient ischemic attack) 12/09    Past Surgical History:  Procedure Laterality Date  . ABDOMINAL HYSTERECTOMY     no oophorectomy  . BLADDER SURGERY     x 2 in the 80s  . BREAST BIOPSY     L (-)  . IR KYPHO EA ADDL LEVEL THORACIC OR LUMBAR  12/29/2019  . IR KYPHO LUMBAR INC FX REDUCE BONE BX UNI/BIL CANNULATION INC/IMAGING  01/06/2020  . IR KYPHO THORACIC WITH BONE BIOPSY  12/29/2019  . IR KYPHO THORACIC WITH BONE BIOPSY  02/02/2020    Allergies as of 10/11/2020      Reactions   Sulfonamide Derivatives Other (See Comments)   Skin irritation      Medication List       Accurate as of October 11, 2020 10:06 PM. If you have any questions, ask your nurse or doctor.        STOP taking these medications   alendronate 70 MG tablet Commonly known as: Fosamax Stopped by: Willow Ora, MD   amoxicillin-clavulanate 500-125 MG tablet Commonly known as: Augmentin Stopped by: Willow Ora, MD   feeding supplement Liqd Stopped by: Willow Ora, MD   Oscal 500/200 D-3 500-200 MG-UNIT tablet Generic drug: calcium-vitamin D Stopped by:  Willow Ora, MD   senna-docusate 8.6-50 MG tablet Commonly known as: Senokot-S Stopped by: Willow Ora, MD   Vitamin D (Ergocalciferol) 1.25 MG (50000 UNIT) Caps capsule Commonly known as: DRISDOL Stopped by: Willow Ora, MD     TAKE these medications   aspirin EC 81 MG tablet Take 81 mg by mouth daily.   cholecalciferol 25 MCG (1000 UNIT) tablet Commonly known as: VITAMIN D3 Take 2,000 Units by mouth daily.   diclofenac Sodium 1 % Gel Commonly known as: VOLTAREN Apply 2 g topically 4 (four) times daily.   escitalopram 5 MG tablet Commonly known as: LEXAPRO Take 1 tablet (5 mg total) by mouth daily.   HYDROcodone-acetaminophen 5-325 MG tablet Commonly known as: NORCO/VICODIN Take 1-2 tablets by mouth every 8 (eight) hours as needed for moderate pain or severe pain.   hydrocortisone 2.5 % cream Apply topically 2 (two) times daily. What changed:   how much to take  when to take this  reasons to take this   levothyroxine 88 MCG tablet Commonly known as: SYNTHROID Take 1 tablet (88 mcg total) by mouth daily before breakfast.   multivitamin with minerals Tabs tablet Take 1 tablet by mouth  daily.   OVER THE COUNTER MEDICATION Apply 1 application topically 5 (five) times daily as needed (back pain). "Comfrey Salve"   polyethylene glycol 17 g packet Commonly known as: MIRALAX / GLYCOLAX Take 17 g by mouth daily as needed (constipation).          Objective:   Physical Exam BP (!) 150/70 (BP Location: Right Arm, Patient Position: Sitting, Cuff Size: Large)   Pulse (!) 38   Temp (!) 97.4 F (36.3 C) (Oral)   Ht 5\' 7"  (1.702 m)   Wt 139 lb 6.4 oz (63.2 kg)   SpO2 98%   BMI 21.83 kg/m  General: Well developed, NAD, BMI noted HEENT:  Normocephalic . Face symmetric, atraumatic Lungs:  CTA B Normal respiratory effort, no intercostal retractions, no accessory muscle use. Heart: Bradycardia.  Abdomen:  Not distended, soft, non-tender.   Lower extremities: no  pretibial edema bilaterally  Skin: Exposed areas without rash. Not pale. Not jaundice Neurologic:  alert & pleasantly demented, cooperative, follow commands, short-term memory loss noted. Speech normal, gait slow, unassisted Psych: Behavior appropriate. No anxious or depressed appearing.     Assessment    Assessment Prediabetes dx 2009 >>>A1c 6.0 Hypothyroidism TIA 2009 MSK: --DJD  --Osteoporosis (started Fosamax 01-2020 after fracture) ---Vertebral fracture 12-2019, kyphoplasty, admitted for pain control Familial tremor CV:see cardiology at [facility] ---AV reentry tachycardia, ablation 2005 ---Bradycardic , Mobitz I , junctional escapes -- saw cards @ [facility], f/u 1 year, pt decided not to go back History of pressure ulcers Dementia (symptoms noticeable started 12-2019)  PLAN Here for a CPX Prediabetes: Checking A1c.  In general doing well, she lost weight earlier this year, she is getting back. Thyroid disease: on synthroid,  checking labs Elevated BP: BP slightly elevated today, at home is consistently normal. TIA, remote, continue aspirin?  We agreed to continue with aspirin Statins: she is very clear , won't take them thus no need for FLP Vertebral fracture:  Had a vertebral fracture on 12-2019, she was essentially bedridden for a while.  Currently improved.  Pain is not a major issue but will leave hydrocodone on the med list in case she needs it. Osteoporosis: Rx Fosamax 01-2020, the family don't think she will be able to stay sitting for 30 minutes, with talk about other options including  Prolia, she will only accept if that is provided at home by a [facility] agency (after sun investigation that is not possible for thus will not pursue meds). Also not taking calcium- vitamin D, she has a tendency to be constipated.  Recommend to at least take vitamin D 2000 units Dementia: Earlier this year her short-term memory got much worse, she has dementia, will do more detailed exam  when she comes back.  Aricept, Namenda?  Family declined. Pressure ulcers: Developed pressure ulcer since last visit, the resolved, still has a hospital bed & gel overlay  Social: Lives herself, family is quite supportive, every day she has somebody with her, they arrange her medicines. RTC 6 months   In addition to CPX, I spent 25 minutes with the patient & her daughter [facility] discussing other issues including prediabetes, thyroid disease, history of TIA, aspirin, statins, osteoporosis treatment, dementia treatment options.   This visit occurred during the SARS-CoV-2 public health emergency.  Safety protocols were in place, including screening questions prior to the visit, additional usage of staff PPE, and extensive cleaning of exam room while observing appropriate contact time as indicated for disinfecting solutions.

## 2020-10-11 NOTE — Patient Instructions (Addendum)
Check the  blood pressure regularly.  BP GOAL is between 110/65 and  135/85. If it is consistently higher or lower, let me know    GO TO THE LAB : Get the blood work     GO TO THE FRONT DESK, PLEASE SCHEDULE YOUR APPOINTMENTS Come back for a checkup in 6 months 

## 2020-10-24 DIAGNOSIS — S22000A Wedge compression fracture of unspecified thoracic vertebra, initial encounter for closed fracture: Secondary | ICD-10-CM | POA: Diagnosis not present

## 2020-10-25 ENCOUNTER — Telehealth: Payer: Self-pay | Admitting: Internal Medicine

## 2020-10-25 NOTE — Telephone Encounter (Signed)
Patient's daughter called in reference to exsposure to covid 19 this weekend. Patient is not experiencing any symptoms and vaccinated, patient's daughter would like to know what to do if  experiencing any  symptoms.   Please Advise

## 2020-10-25 NOTE — Telephone Encounter (Signed)
LMOM informing Amy Weiss of recommendations. Instructed her to call if questions/concerns.

## 2020-10-25 NOTE — Telephone Encounter (Signed)
Please advise 

## 2020-10-25 NOTE — Telephone Encounter (Signed)
She needs  to quarantine from whoever was exposed to Covid. If Amy Weiss develops mild symptoms: Needs PCR Covid test and let me know the results. If she develops more severe symptoms: ER

## 2020-10-26 ENCOUNTER — Telehealth: Payer: Self-pay | Admitting: Internal Medicine

## 2020-10-26 NOTE — Telephone Encounter (Signed)
Spoke w/ Margie- verbal orders given.

## 2020-10-26 NOTE — Telephone Encounter (Signed)
Margie with [facility] is seeking a verbal order for a in home covid test for patient b/c patient is having flu like symptoms after being exposure to unmasked  daughter who is sick.   Please Advise  Call Back @ [phone]

## 2020-10-27 ENCOUNTER — Encounter: Payer: Self-pay | Admitting: Internal Medicine

## 2020-10-29 ENCOUNTER — Telehealth: Payer: Self-pay | Admitting: Internal Medicine

## 2020-10-29 NOTE — Telephone Encounter (Signed)
Noted, thank you

## 2020-10-29 NOTE — Telephone Encounter (Signed)
Caller: Tammy ([facility] / [facility]) Call back : [phone]  FYI  Like to inform you covid test was negative. They also fax a report

## 2020-10-29 NOTE — Telephone Encounter (Signed)
Daughter called back she was aware of the results.

## 2020-10-29 NOTE — Telephone Encounter (Signed)
Returned call to daughter left Voicemail to call office back.

## 2020-10-29 NOTE — Telephone Encounter (Signed)
[name] daughter would like to know mom covid test results please call her back at [phone]

## 2020-11-09 DIAGNOSIS — H5213 Myopia, bilateral: Secondary | ICD-10-CM | POA: Diagnosis not present

## 2020-11-09 DIAGNOSIS — H2511 Age-related nuclear cataract, right eye: Secondary | ICD-10-CM | POA: Diagnosis not present

## 2020-11-15 IMAGING — XA IR KYPHO VERTEBRAL LUMBAR AUGMENTATION
1 series · 13 of 19 positions shown · non-contrast
Comparison: MRI of the lumbosacral spine January 03, 2020.

INDICATION: Severe low back pain secondary to compression fracture at L2.

EXAM:
BALLOON KYPHOPLASTY AT L2

[Series 300: spine · 13 of 19 slices shown]
[im 1/19]
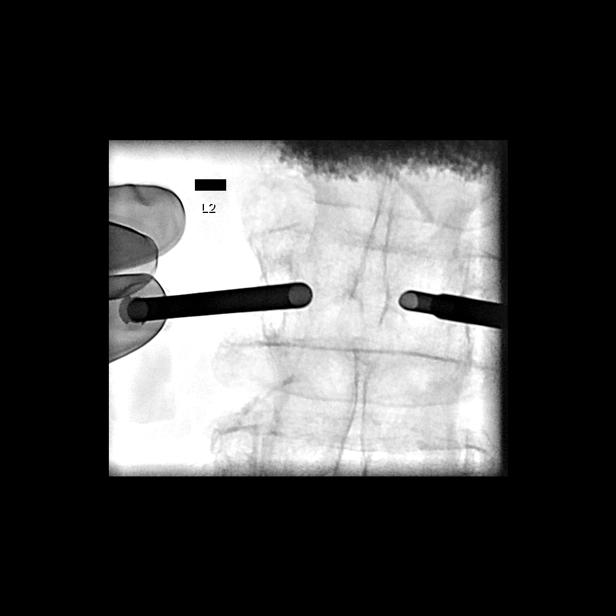
[im 3/19]
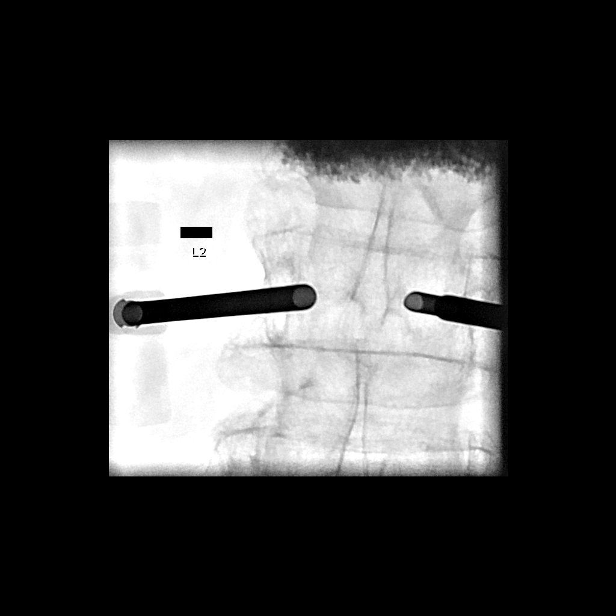
[im 4/19]
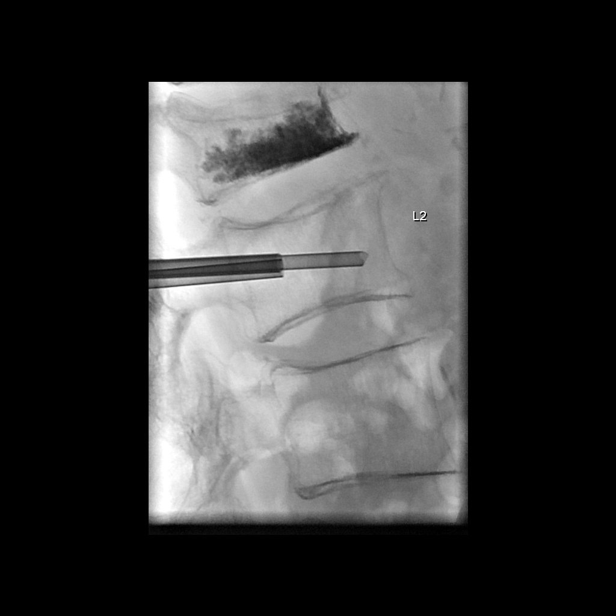
[im 6/19]
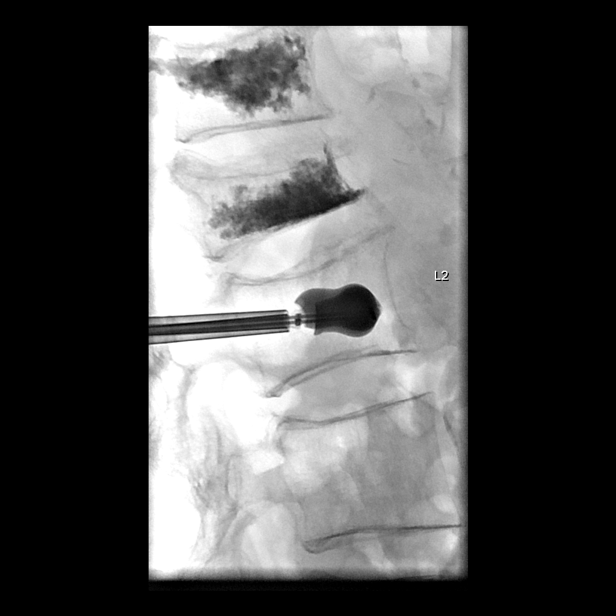
[im 7/19]
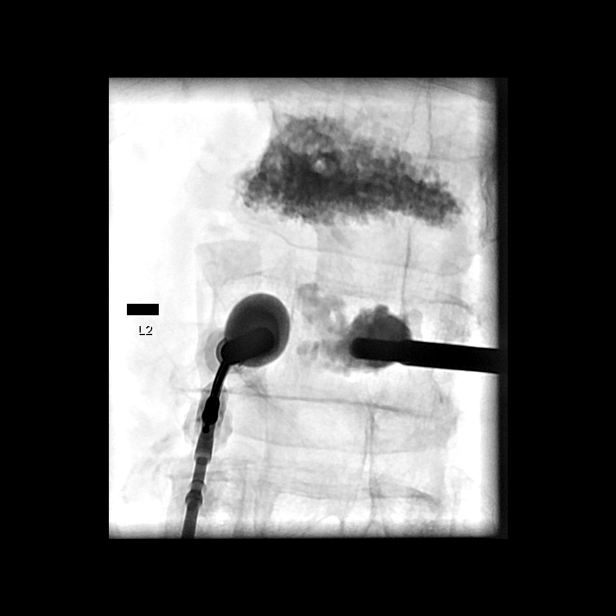
[im 9/19]
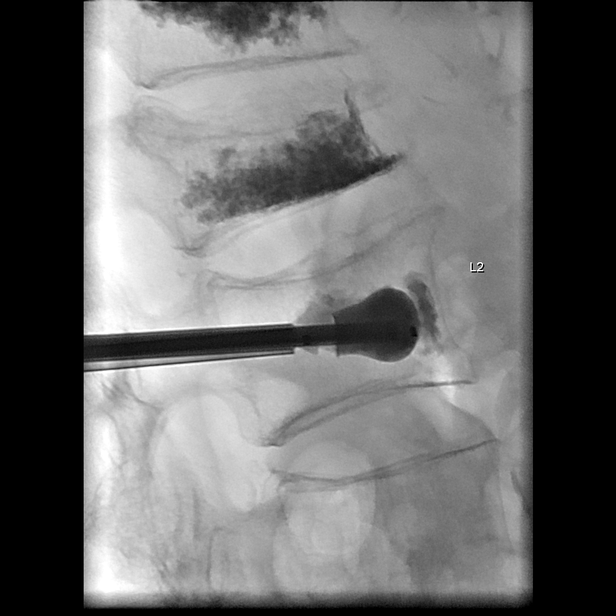
[im 10/19]
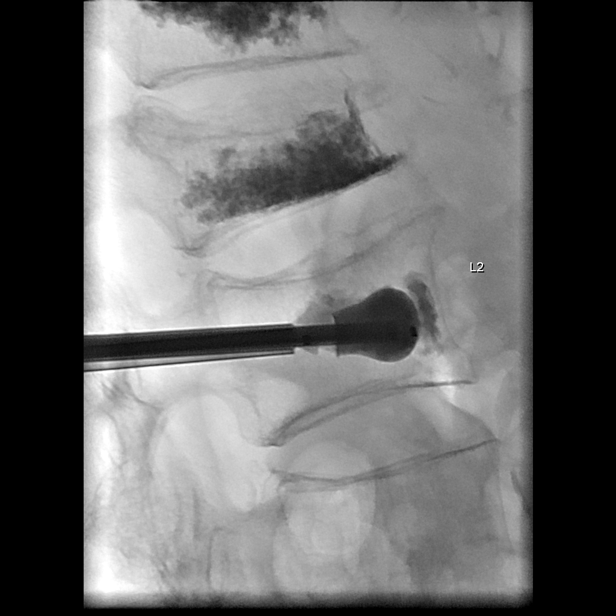
[im 11/19]
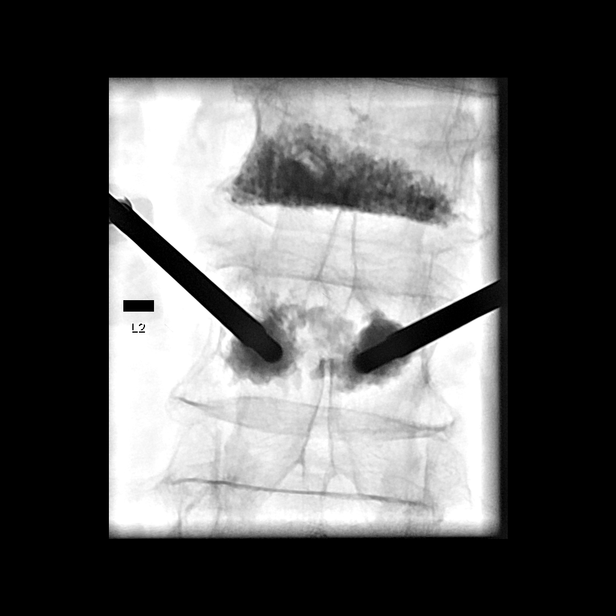
[im 13/19]
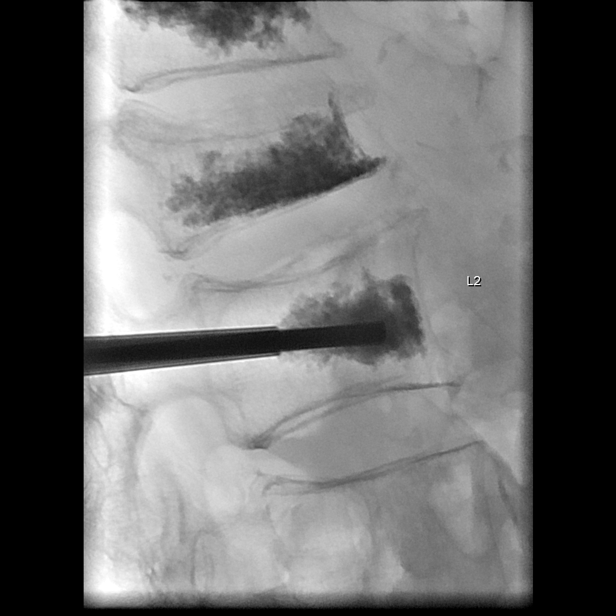
[im 14/19]
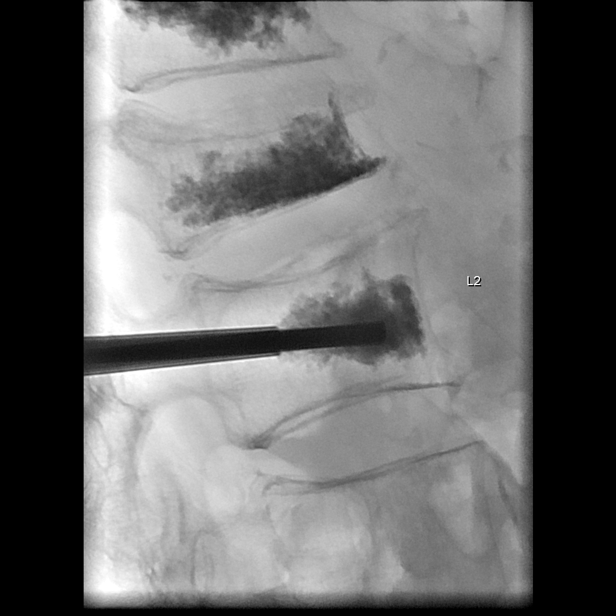
[im 16/19]
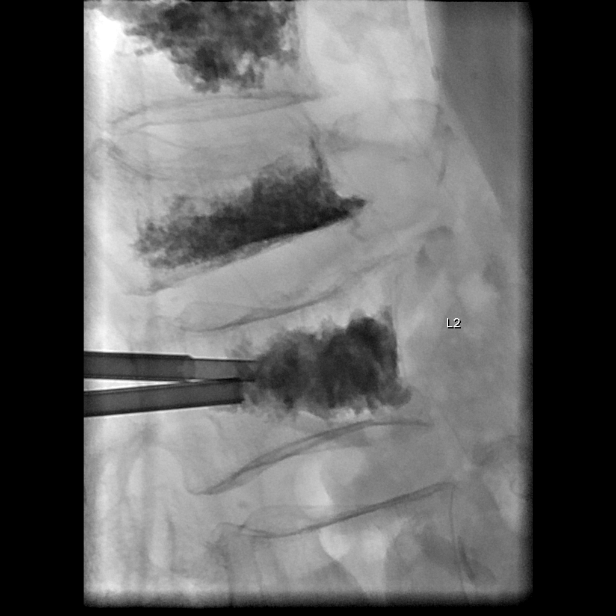
[im 17/19]
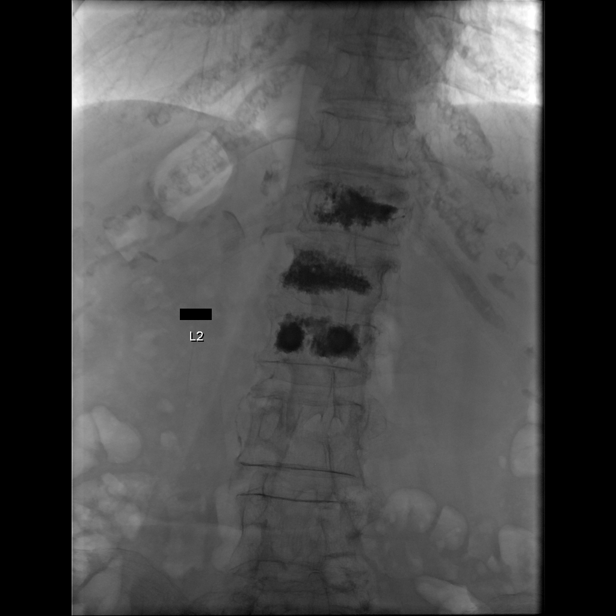
[im 19/19]
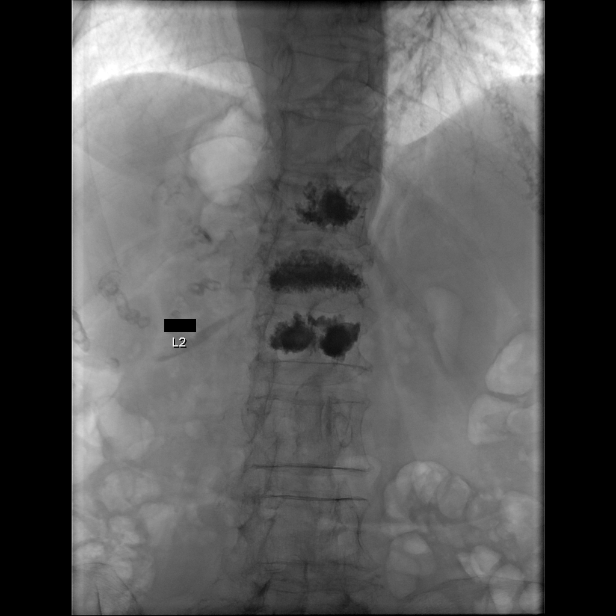

[13 of 19 positions shown; findings below may reference images not displayed]

MEDICATIONS:
As antibiotic prophylaxis, Ancef 2 g IV was ordered pre-procedure
and administered intravenously within 1 hour of incision.

ANESTHESIA/SEDATION:
Moderate (conscious) sedation was employed during this procedure. A
total of Versed 1.5 mg and Fentanyl 50 mcg, and Dilaudid 1 mg IV
were administered intravenously.

Moderate Sedation Time: 35 minutes. The patient's level of
consciousness and vital signs were monitored continuously by
radiology nursing throughout the procedure under my direct
supervision.

FLUOROSCOPY TIME:  Fluoroscopy Time: 13 minutes 24 seconds (8641
mGy)

COMPLICATIONS:
None immediate.

PROCEDURE:
Following a full explanation of the procedure along with the
potential associated complications, an informed witnessed consent
was obtained.

The patient was placed prone on the fluoroscopic table. The skin
overlying the lumbar region was then prepped and draped in the usual
sterile fashion. The both pedicles at L2 were then infiltrated with
0.25% bupivacaine, followed by the advancement of a 11 gauge
Nuur into the posterior [DATE] at L2. These were then exchanged for
a Kyphon advanced osteo introducer system comprised of a working
cannula and a Kyphon osteo drill.

Combinations were then advanced over a Kyphon osteo bone pin until
the tip of the Kyphon osteo drill was in the posterior third at L2.

At this time, the bone pins were removed. In a medial trajectory,
the combination were advanced until the tips of the working cannulae
was inside the posterior one-third at L2.

The osteo drills were removed.

Through the working cannulae, a Kyphon inflatable bone tamp 20 x 3
was advanced and positioned with the distal marker 5 mm from the
anterior aspect of L2. Crossing of the midline was seen on the AP
projection. At this time, the balloon was expanded using contrast
via a Kyphon inflation syringe device via microtubing.

Inflations were continued until there was apposition with the
superior endplate.

At this time, methylmethacrylate mixture was reconstituted with
Tobramycin in the Kyphon bone mixing device system. This was then
loaded onto the Kyphon bone fillers.

The balloons were deflated and removed followed by the instillation
of 5 bone filler equivalents of methylmethacrylate mixture with
excellent filling in the AP and lateral projections. No
extravasation was noted in the disk spaces or posteriorly into the
spinal canal. No epidural venous contamination was seen.

The working cannula and the bone filler were then retrieved and
removed. Hemostasis was achieved at the skin entry sites.
IMPRESSION: 1. Status post vertebral body augmentation using balloon kyphoplasty
at L2 as described without event.

## 2020-11-20 IMAGING — CR DG LUMBAR SPINE 2-3V
3 series · 3 of 3 positions shown · non-contrast
Comparison: MRI lumbar spine dated 01/03/2020

CLINICAL DATA: Fall, L2 compression fracture, prior vertebral
augmentation

EXAM:
LUMBAR SPINE - 2-3 VIEW

[l-spine ap]
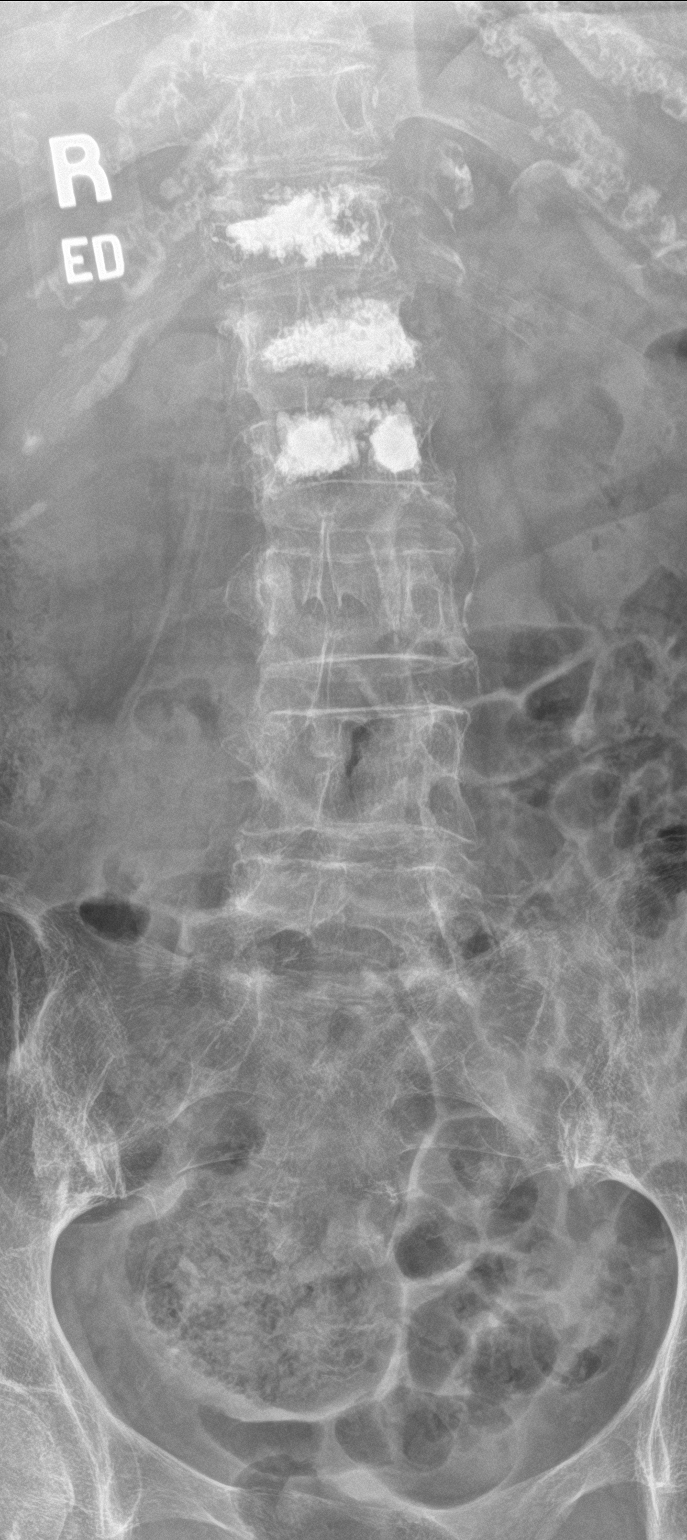

[l-spine lat]
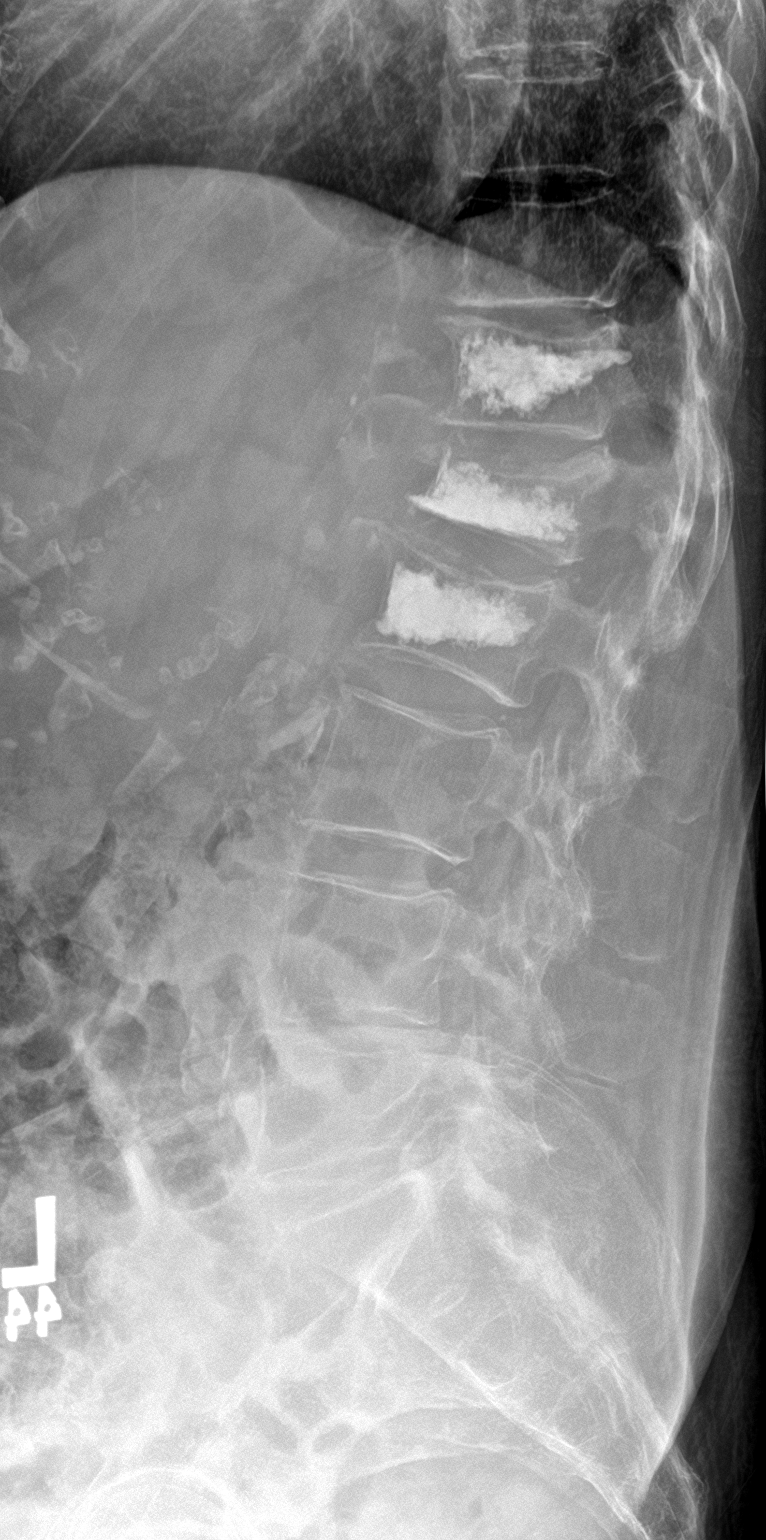

[l-spine spot]
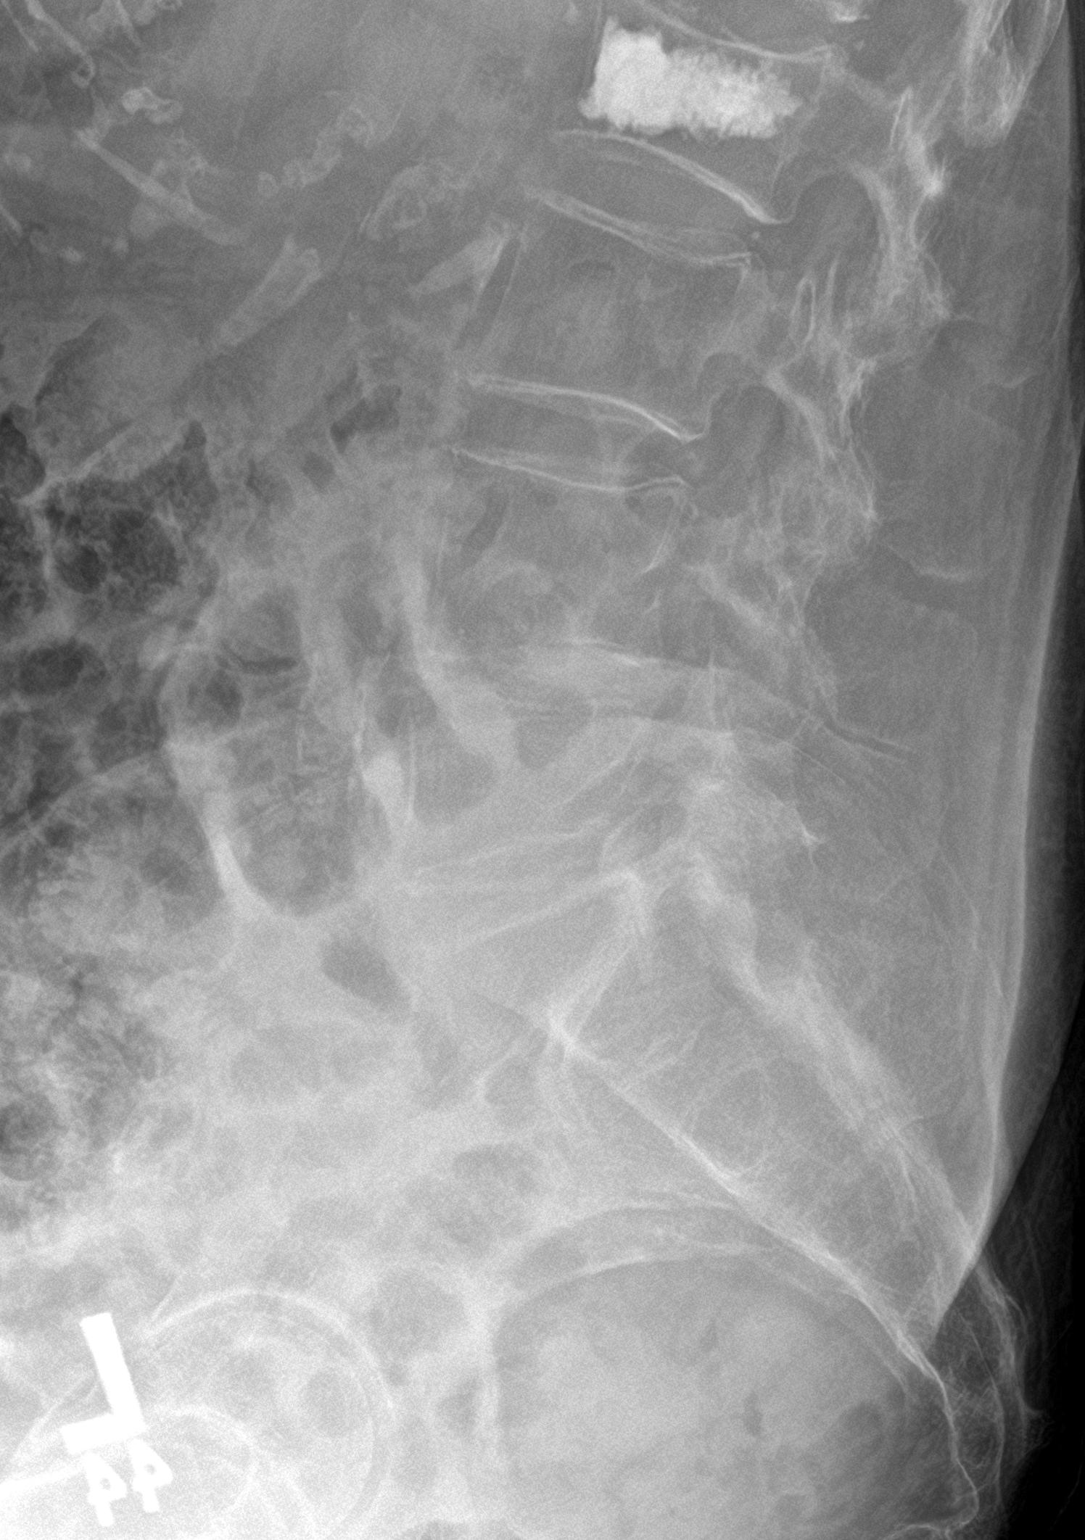

[3 of 3 positions shown; findings below may reference images not displayed]

FINDINGS: Normal lumbar lordosis.

Prior vertebral augmentation at T12 and L1. Interval vertebral
augmentation at L2.

Mild (10-15%) loss of height at T12 and L2. Moderate (40%) loss of
height at L1. No retropulsion.

Visualized bony pelvis appears intact.
IMPRESSION: Vertebral augmentation at T12-L2, with mild to moderate compression
fracture deformities, as above.

## 2020-12-31 ENCOUNTER — Other Ambulatory Visit: Payer: Self-pay | Admitting: Internal Medicine

## 2021-01-31 ENCOUNTER — Encounter: Payer: Self-pay | Admitting: Internal Medicine

## 2021-02-09 ENCOUNTER — Telehealth: Payer: Self-pay

## 2021-02-09 NOTE — Telephone Encounter (Signed)
Hospice orders signed and mailed back to [facility] in envelope provided. Copy sent for scanning.

## 2021-03-22 ENCOUNTER — Telehealth: Payer: Self-pay

## 2021-03-22 NOTE — Telephone Encounter (Signed)
Pt d/c from Hospice on 03/15/2021.

## 2021-05-26 ENCOUNTER — Telehealth: Payer: Self-pay | Admitting: Internal Medicine

## 2021-05-26 MED ORDER — HYDROCODONE-ACETAMINOPHEN 5-325 MG PO TABS: 1.0000 | ORAL_TABLET | Freq: Three times a day (TID) | ORAL | 0 refills | Status: DC | PRN

## 2021-05-26 NOTE — Telephone Encounter (Signed)
Caller: Drenda Freeze [facility]) Call back # [phone]  Verbal order  Palliative Program

## 2021-05-26 NOTE — Telephone Encounter (Signed)
Advise patient: I sent a small amount, please arrange office visit,

## 2021-05-26 NOTE — Telephone Encounter (Signed)
Requesting: hydrocodone 5-325mg  Contract: None UDS: None Last Visit: 10/11/2020 Next Visit: None Last Refill: 03/08/2020 #40 and 0RF  Please Advise

## 2021-05-26 NOTE — Telephone Encounter (Signed)
Spoke w/ [name]- [name], informed that hydrocodone has been refilled, informed due for appt- she informed that Pt is again pretty much bedridden, she is pretty sure she has another spine fracture. We agreed to schedule a virtual visit for tomorrow at 1:20pm- she did inform that Pt does not want anymore surgeries, be admitted or go to rehab for her back. [name] and family discussed keeping her home and comfortable. Informed I'd note all this in the chart and I'd call tomorrow several minutes before her appt to set up the visit.

## 2021-05-26 NOTE — Telephone Encounter (Signed)
thx

## 2021-05-26 NOTE — Telephone Encounter (Signed)
Medication: HYDROcodone-acetaminophen (NORCO/VICODIN) 5-325 MG tablet [[id number]]      Has the patient contacted their pharmacy? No. (If no, request that the patient contact the pharmacy for the refill.) (If yes, when and what did the pharmacy advise?)     Preferred Pharmacy (with phone number or street name):  [id number] - [place], [place] - [address]  [address], [place] [address]  Phone:  [phone]  Fax:  [phone]     Agent: Please be advised that RX refills may take up to 3 business days. We ask that you follow-up with your pharmacy.

## 2021-05-26 NOTE — Telephone Encounter (Signed)
Spoke w/ Revonda Standard- verbal orders given.

## 2021-05-27 ENCOUNTER — Telehealth (INDEPENDENT_AMBULATORY_CARE_PROVIDER_SITE_OTHER): Payer: Medicare Other | Admitting: Internal Medicine

## 2021-05-27 ENCOUNTER — Encounter: Payer: Self-pay | Admitting: Internal Medicine

## 2021-05-27 VITALS — Ht 67.0 in

## 2021-05-27 DIAGNOSIS — M8000XS Age-related osteoporosis with current pathological fracture, unspecified site, sequela: Secondary | ICD-10-CM

## 2021-05-27 DIAGNOSIS — M549 Dorsalgia, unspecified: Secondary | ICD-10-CM | POA: Diagnosis not present

## 2021-05-27 DIAGNOSIS — E039 Hypothyroidism, unspecified: Secondary | ICD-10-CM | POA: Diagnosis not present

## 2021-05-27 DIAGNOSIS — F0390 Unspecified dementia without behavioral disturbance: Secondary | ICD-10-CM

## 2021-05-27 NOTE — Progress Notes (Signed)
Subjective:    Patient ID: Amy Weiss, female    DOB: Dec 02, 1934, 86 y.o.   MRN: [id number]  DOS:  05/27/2021 Type of visit - description: Virtual Visit via Video Note  I connected with the above patient  by a video enabled telemedicine application and verified that I am speaking with the correct person using two identifiers.   THIS ENCOUNTER IS A VIRTUAL VISIT DUE TO COVID-19 - PATIENT WAS NOT SEEN IN THE OFFICE. PATIENT HAS CONSENTED TO VIRTUAL VISIT / TELEMEDICINE VISIT   Location of patient: home  Location of provider: office  Persons participating in the virtual visit: patient, provider   I discussed the limitations of evaluation and management by telemedicine and the availability of in person appointments. The patient expressed understanding and agreed to proceed.  Acute The visit is conducted with the patient and [name]. Amy Weiss reports patient  that she was up and about up until yesterday when she picked up a cushion and in the process she developed back pain. Back pain is severe, she cannot find a comfortable position. They believe she has another vertebral fracture.  Mentally she is about the same, family noted problems with short-term memory. No pressure ulcer at this point.  Review of Systems See above   Past Medical History:  Diagnosis Date   Diabetes mellitus 4/09   A1C-6   Familial tremor    Hyperlipidemia    Hypothyroidism    Osteoporosis    Tachycardia    AV Re-entry, s/p ablation aprox 2005   TIA (transient ischemic attack) 12/09    Past Surgical History:  Procedure Laterality Date   ABDOMINAL HYSTERECTOMY     no oophorectomy   BLADDER SURGERY     x 2 in the 80s   BREAST BIOPSY     L (-)   IR KYPHO EA ADDL LEVEL THORACIC OR LUMBAR  12/29/2019   IR KYPHO LUMBAR INC FX REDUCE BONE BX UNI/BIL CANNULATION INC/IMAGING  01/06/2020   IR KYPHO THORACIC WITH BONE BIOPSY  12/29/2019   IR KYPHO THORACIC WITH BONE BIOPSY  02/02/2020     Allergies as of 05/27/2021       Reactions   Sulfonamide Derivatives Other (See Comments)   Skin irritation        Medication List        Accurate as of May 27, 2021  1:34 PM. If you have any questions, ask your nurse or doctor.          STOP taking these medications    multivitamin with minerals Tabs tablet Stopped by: Willow Ora, MD   OVER THE COUNTER MEDICATION Stopped by: Willow Ora, MD       TAKE these medications    aspirin EC 81 MG tablet Take 81 mg by mouth daily.   cholecalciferol 25 MCG (1000 UNIT) tablet Commonly known as: VITAMIN D3 Take 2,000 Units by mouth daily.   diclofenac Sodium 1 % Gel Commonly known as: VOLTAREN Apply 2 g topically 4 (four) times daily.   escitalopram 5 MG tablet Commonly known as: LEXAPRO Take 1 tablet (5 mg total) by mouth daily. What changed: how much to take   HYDROcodone-acetaminophen 5-325 MG tablet Commonly known as: NORCO/VICODIN Take 1-2 tablets by mouth every 8 (eight) hours as needed for moderate pain or severe pain.   hydrocortisone 2.5 % cream Apply topically 2 (two) times daily. What changed:  how much to take when to take this reasons to take this  levothyroxine 88 MCG tablet Commonly known as: SYNTHROID Take 1 tablet (88 mcg total) by mouth daily before breakfast.   polyethylene glycol 17 g packet Commonly known as: MIRALAX / GLYCOLAX Take 17 g by mouth daily as needed (constipation).           Objective:   Physical Exam Ht 5\' 7"  (1.702 m)   BMI 21.83 kg/m  This is a virtual video visit, I see the patient and her daughter, patient is sitting in the sofa, changes positions frequently, alert, oriented to self, not oriented to time.  Not toxic appearing.  Answering questions, follow all directions.    Assessment    Assessment Prediabetes   dx 2009 >>>  A1c 6.0 Hypothyroidism TIA 2009 MSK: --DJD  --Osteoporosis (started Fosamax 01-2020 after fracture) ---Vertebral fracture 12-2019,  kyphoplasty, admitted for pain control Familial tremor CV: see cardiology at [facility] ---AV reentry tachycardia, ablation 2005 ---Bradycardic , Mobitz I , junctional escapes -- saw cards @ [facility], f/u 1 year, pt decided not to go back History of pressure ulcers Dementia (symptoms noticeable started 12-2019)  PLAN Back pain: Started yesterday, after she simply pick up a cushion.  The family suspect she has another vertebrall fracture and I tend to agree but there is no documentation. For now the only thing they are asking is hydrocodone to treat the pain. Prior to this episode, she was "up and around", recovered well from previous vertebral fracture last year. The daughter likes to wait and see how she is doing, if in the next few days pain is not getting better she would like to consider further evaluation.   The patient adamantly declines any more testing, she does not like anything done. They will contact me when needed. Hypothyroidism: Unable to check a TSH, currently unable to get out of the house Dementia: See previous entries, not on any medications at this point. Osteoporosis: Unable to treat, see previous entries, encouraged vitamin D daily which she is apparently not taking. Pressure ulcers: No ulcers at this time according to the family Social:  Still lives by herself, she has help every day.  Family is very involved in her care. Palliative/hospice care: It  stopped a few weeks ago, apparently a funding issue per [place], service to restart soon.   Time spent 30 minutes   I discussed the assessment and treatment plan with the patient. The patient was provided an opportunity to ask questions and all were answered. The patient agreed with the plan and demonstrated an understanding of the instructions.   The patient was advised to call back or seek an in-person evaluation if the symptoms worsen or if the condition fails to improve as anticipated.

## 2021-05-28 NOTE — Assessment & Plan Note (Signed)
Back pain: Started yesterday, after she simply pick up a cushion.  The family suspect she has another vertebrall fracture and I tend to agree but there is no documentation. For now the only thing they are asking is hydrocodone to treat the pain. Prior to this episode, she was "up and around", recovered well from previous vertebral fracture last year. The daughter likes to wait and see how she is doing, if in the next few days pain is not getting better she would like to consider further evaluation.   The patient adamantly declines any more testing, she does not like anything done. They will contact me when needed. Hypothyroidism: Unable to check a TSH, currently unable to get out of the house Dementia: See previous entries, not on any medications at this point. Osteoporosis: Unable to treat, see previous entries, encouraged vitamin D daily which she is apparently not taking. Pressure ulcers: No ulcers at this time according to the family Social:  Still lives by herself, she has help every day.  Family is very involved in her care. Palliative/hospice care: It  stopped a few weeks ago, apparently a funding issue per [place], service to restart soon.

## 2021-05-30 ENCOUNTER — Inpatient Hospital Stay (HOSPITAL_COMMUNITY)
Admission: EM | Admit: 2021-05-30 | Discharge: 2021-06-03 | DRG: 517 | Disposition: A | Discharge status: Home Health | Payer: Medicare Other | Attending: Family Medicine | Admitting: Family Medicine

## 2021-05-30 ENCOUNTER — Other Ambulatory Visit: Payer: Self-pay

## 2021-05-30 ENCOUNTER — Encounter (HOSPITAL_COMMUNITY): Payer: Self-pay

## 2021-05-30 ENCOUNTER — Emergency Department (HOSPITAL_COMMUNITY): Payer: Medicare Other

## 2021-05-30 DIAGNOSIS — G250 Essential tremor: Secondary | ICD-10-CM | POA: Diagnosis present

## 2021-05-30 DIAGNOSIS — E039 Hypothyroidism, unspecified: Secondary | ICD-10-CM | POA: Diagnosis present

## 2021-05-30 DIAGNOSIS — I10 Essential (primary) hypertension: Secondary | ICD-10-CM | POA: Diagnosis present

## 2021-05-30 DIAGNOSIS — M4856XA Collapsed vertebra, not elsewhere classified, lumbar region, initial encounter for fracture: Secondary | ICD-10-CM | POA: Diagnosis present

## 2021-05-30 DIAGNOSIS — R001 Bradycardia, unspecified: Secondary | ICD-10-CM | POA: Diagnosis present

## 2021-05-30 DIAGNOSIS — F0390 Unspecified dementia without behavioral disturbance: Secondary | ICD-10-CM | POA: Diagnosis not present

## 2021-05-30 DIAGNOSIS — S32030A Wedge compression fracture of third lumbar vertebra, initial encounter for closed fracture: Secondary | ICD-10-CM

## 2021-05-30 DIAGNOSIS — Z7982 Long term (current) use of aspirin: Secondary | ICD-10-CM

## 2021-05-30 DIAGNOSIS — M47815 Spondylosis without myelopathy or radiculopathy, thoracolumbar region: Secondary | ICD-10-CM | POA: Diagnosis present

## 2021-05-30 DIAGNOSIS — X500XXA Overexertion from strenuous movement or load, initial encounter: Secondary | ICD-10-CM

## 2021-05-30 DIAGNOSIS — Z79899 Other long term (current) drug therapy: Secondary | ICD-10-CM | POA: Diagnosis not present

## 2021-05-30 DIAGNOSIS — E785 Hyperlipidemia, unspecified: Secondary | ICD-10-CM | POA: Diagnosis not present

## 2021-05-30 DIAGNOSIS — Z20822 Contact with and (suspected) exposure to covid-19: Secondary | ICD-10-CM | POA: Diagnosis present

## 2021-05-30 DIAGNOSIS — S32000A Wedge compression fracture of unspecified lumbar vertebra, initial encounter for closed fracture: Secondary | ICD-10-CM | POA: Diagnosis not present

## 2021-05-30 DIAGNOSIS — Z882 Allergy status to sulfonamides status: Secondary | ICD-10-CM | POA: Diagnosis not present

## 2021-05-30 DIAGNOSIS — Z8249 Family history of ischemic heart disease and other diseases of the circulatory system: Secondary | ICD-10-CM

## 2021-05-30 DIAGNOSIS — Z66 Do not resuscitate: Secondary | ICD-10-CM | POA: Diagnosis present

## 2021-05-30 DIAGNOSIS — M810 Age-related osteoporosis without current pathological fracture: Secondary | ICD-10-CM | POA: Diagnosis present

## 2021-05-30 DIAGNOSIS — I480 Paroxysmal atrial fibrillation: Secondary | ICD-10-CM | POA: Diagnosis present

## 2021-05-30 DIAGNOSIS — E119 Type 2 diabetes mellitus without complications: Secondary | ICD-10-CM | POA: Diagnosis present

## 2021-05-30 DIAGNOSIS — R54 Age-related physical debility: Secondary | ICD-10-CM | POA: Diagnosis present

## 2021-05-30 DIAGNOSIS — Z87891 Personal history of nicotine dependence: Secondary | ICD-10-CM

## 2021-05-30 DIAGNOSIS — Z6823 Body mass index (BMI) 23.0-23.9, adult: Secondary | ICD-10-CM

## 2021-05-30 DIAGNOSIS — Z800 Family history of malignant neoplasm of digestive organs: Secondary | ICD-10-CM

## 2021-05-30 DIAGNOSIS — Z8673 Personal history of transient ischemic attack (TIA), and cerebral infarction without residual deficits: Secondary | ICD-10-CM | POA: Diagnosis not present

## 2021-05-30 DIAGNOSIS — Z79890 Hormone replacement therapy (postmenopausal): Secondary | ICD-10-CM

## 2021-05-30 LAB — CBC WITH DIFFERENTIAL/PLATELET
Abs Immature Granulocytes: 0.03 10*3/uL (ref 0.00–0.07)
Basophils Absolute: 0.1 10*3/uL (ref 0.0–0.1)
Basophils Relative: 1 %
Eosinophils Absolute: 0.3 10*3/uL (ref 0.0–0.5)
Eosinophils Relative: 4 %
HCT: 41.2 % (ref 36.0–46.0)
Hemoglobin: 13.4 g/dL (ref 12.0–15.0)
Immature Granulocytes: 0 %
Lymphocytes Relative: 23 %
Lymphs Abs: 1.6 10*3/uL (ref 0.7–4.0)
MCH: 29 pg (ref 26.0–34.0)
MCHC: 32.5 g/dL (ref 30.0–36.0)
MCV: 89.2 fL (ref 80.0–100.0)
Monocytes Absolute: 1 10*3/uL (ref 0.1–1.0)
Monocytes Relative: 15 %
Neutro Abs: 4.1 10*3/uL (ref 1.7–7.7)
Neutrophils Relative %: 57 %
Platelets: 244 10*3/uL (ref 150–400)
RBC: 4.62 MIL/uL (ref 3.87–5.11)
RDW: 14.6 % (ref 11.5–15.5)
WBC: 7.1 10*3/uL (ref 4.0–10.5)
nRBC: 0 % (ref 0.0–0.2)

## 2021-05-30 LAB — RESP PANEL BY RT-PCR (FLU A&B, COVID) ARPGX2
Influenza A by PCR: NEGATIVE
Influenza B by PCR: NEGATIVE
SARS Coronavirus 2 by RT PCR: NEGATIVE

## 2021-05-30 LAB — BASIC METABOLIC PANEL
Anion gap: 4 — ABNORMAL LOW (ref 5–15)
BUN: 16 mg/dL (ref 8–23)
CO2: 27 mmol/L (ref 22–32)
Calcium: 8.7 mg/dL — ABNORMAL LOW (ref 8.9–10.3)
Chloride: 106 mmol/L (ref 98–111)
Creatinine, Ser: 0.76 mg/dL (ref 0.44–1.00)
GFR, Estimated: >60 mL/min (ref >60)
Glucose, Bld: 110 mg/dL — ABNORMAL HIGH (ref 70–99)
Potassium: 4 mmol/L (ref 3.5–5.1)
Sodium: 137 mmol/L (ref 135–145)

## 2021-05-30 MED ORDER — LIDOCAINE 5 % EX PTCH
1.0000 | MEDICATED_PATCH | CUTANEOUS | Status: DC
  Administered 2021-05-30 – 2021-06-02 (×3): 1 via TRANSDERMAL
  Filled 2021-05-30 (×3): qty 1

## 2021-05-30 MED ORDER — ONDANSETRON HCL 4 MG PO TABS
4.0000 mg | ORAL_TABLET | Freq: Four times a day (QID) | ORAL | Status: DC | PRN
  Administered 2021-06-02: 4 mg via ORAL
  Filled 2021-05-30: qty 1

## 2021-05-30 MED ORDER — BISACODYL 5 MG PO TBEC: 5.0000 mg | DELAYED_RELEASE_TABLET | Freq: Every day | ORAL | Status: DC | PRN

## 2021-05-30 MED ORDER — POLYETHYLENE GLYCOL 3350 17 G PO PACK: 17.0000 g | PACK | Freq: Every day | ORAL | Status: DC | PRN

## 2021-05-30 MED ORDER — ESCITALOPRAM OXALATE 5 MG PO TABS
2.5000 mg | ORAL_TABLET | Freq: Every day | ORAL | Status: DC
  Administered 2021-05-31 – 2021-06-03 (×3): 2.5 mg via ORAL
  Filled 2021-05-30 (×4): qty 1

## 2021-05-30 MED ORDER — ACETAMINOPHEN 650 MG RE SUPP: 650.0000 mg | Freq: Four times a day (QID) | RECTAL | Status: DC | PRN

## 2021-05-30 MED ORDER — HYDROCODONE-ACETAMINOPHEN 5-325 MG PO TABS
1.0000 | ORAL_TABLET | Freq: Three times a day (TID) | ORAL | Status: DC | PRN
  Administered 2021-05-30 – 2021-06-03 (×10): 1 via ORAL
  Filled 2021-05-30 (×10): qty 1

## 2021-05-30 MED ORDER — LEVOTHYROXINE SODIUM 88 MCG PO TABS
88.0000 ug | ORAL_TABLET | Freq: Every day | ORAL | Status: DC
  Administered 2021-05-31 – 2021-06-03 (×4): 88 ug via ORAL
  Filled 2021-05-30 (×4): qty 1

## 2021-05-30 MED ORDER — HYDROCODONE-ACETAMINOPHEN 5-325 MG PO TABS
1.0000 | ORAL_TABLET | Freq: Once | ORAL | Status: AC
  Administered 2021-05-30: 1 via ORAL
  Filled 2021-05-30: qty 1

## 2021-05-30 MED ORDER — HYDRALAZINE HCL 20 MG/ML IJ SOLN
5.0000 mg | INTRAMUSCULAR | Status: DC | PRN
  Administered 2021-06-01 (×2): 5 mg via INTRAVENOUS
  Filled 2021-05-30 (×2): qty 1

## 2021-05-30 MED ORDER — LORAZEPAM 1 MG PO TABS
0.5000 mg | ORAL_TABLET | Freq: Once | ORAL | Status: AC
  Administered 2021-05-30: 0.5 mg via ORAL
  Filled 2021-05-30: qty 1

## 2021-05-30 MED ORDER — ENOXAPARIN SODIUM 40 MG/0.4ML IJ SOSY
40.0000 mg | PREFILLED_SYRINGE | INTRAMUSCULAR | Status: DC
  Administered 2021-05-30 – 2021-06-02 (×2): 40 mg via SUBCUTANEOUS
  Filled 2021-05-30 (×2): qty 0.4

## 2021-05-30 MED ORDER — MORPHINE SULFATE (PF) 2 MG/ML IV SOLN: 2.0000 mg | INTRAVENOUS | Status: DC | PRN

## 2021-05-30 MED ORDER — DOCUSATE SODIUM 100 MG PO CAPS
100.0000 mg | ORAL_CAPSULE | Freq: Two times a day (BID) | ORAL | Status: DC
  Administered 2021-06-01 – 2021-06-02 (×2): 100 mg via ORAL
  Filled 2021-05-30 (×6): qty 1

## 2021-05-30 MED ORDER — ONDANSETRON HCL 4 MG/2ML IJ SOLN: 4.0000 mg | Freq: Four times a day (QID) | INTRAMUSCULAR | Status: DC | PRN

## 2021-05-30 MED ORDER — ACETAMINOPHEN 325 MG PO TABS
650.0000 mg | ORAL_TABLET | Freq: Four times a day (QID) | ORAL | Status: DC | PRN
  Administered 2021-05-31: 650 mg via ORAL
  Filled 2021-05-30 (×2): qty 2

## 2021-05-30 NOTE — H&P (Addendum)
History and Physical    Amy Paollenor J Blasing QIH:[id number] DOB: May 31, 1935 DOA: 05/30/2021  PCP: Wanda PlumpPaz, Jose E, MD Consultants:  None Patient coming from:  Home - lives alone, has cameras to monitor and caregivers for 4 hours a day; NOK: Daughter, Zella BallRobin, [phone]  Chief Complaint: Back pain  HPI: Amy Weiss is a 86 y.o. female with medical history significant of DM; HLD; dementia; compression fractures treated with kyphoplasty; and hypothyroidism presenting with back pain.  She has recurrent compression fractures.  She was lifting the cushion for the outdoor swing and lifted and bent - assuming that the severe pain that resulted was due to a compression fracture.  It happened on 7/7, called PCP on 7/8.  She was unable to get out of bed - but was able to get out of bed and sit with a friend on the porch.  No radiculopathy. She is currently wearing Depends because she can't get out of bed due to pain - not true incontinence.    She has long-standing afib with bradycardia, normally in the 30s.  She doesn't feel different from prior from this standpoint.    ED Course: Back pain, debility.  New L3 compression fracture.  Has had multiple fractures in the past, treated with kyphoplasty.  Bedbound for the last few days due to pain.  Neurosurgery recommends corset, does not need to see.  Daughter wants kyphoplasty.  Review of Systems: As per HPI; otherwise review of systems reviewed and negative.   Ambulatory Status:  Ambulates without assistance - supposed to use a walker but refuses  COVID Vaccine Status:   Complete plus booster  Past Medical History:  Diagnosis Date   Diabetes mellitus 4/09   A1C-6   Familial tremor    Hyperlipidemia    Hypothyroidism    Osteoporosis    Tachycardia    AV Re-entry, s/p ablation aprox 2005   TIA (transient ischemic attack) 12/09    Past Surgical History:  Procedure Laterality Date   ABDOMINAL HYSTERECTOMY     no oophorectomy   BLADDER SURGERY      x 2 in the 80s   BREAST BIOPSY     L (-)   IR KYPHO EA ADDL LEVEL THORACIC OR LUMBAR  12/29/2019   IR KYPHO LUMBAR INC FX REDUCE BONE BX UNI/BIL CANNULATION INC/IMAGING  01/06/2020   IR KYPHO THORACIC WITH BONE BIOPSY  12/29/2019   IR KYPHO THORACIC WITH BONE BIOPSY  02/02/2020    Social History   Socioeconomic History   Marital status: Divorced    Spouse name: Not on file   Number of children: 4   Years of education: Not on file   Highest education level: Not on file  Occupational History   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, fully retired   Tobacco Use   Smoking status: Former    Packs/day: 0.50    Years: 10.00    Pack years: 5.00    Types: Cigarettes    Quit date: 06/20/2012    Years since quitting: 8.9   Smokeless tobacco: Never  Substance and Sexual Activity   Alcohol use: Not Currently    Comment: beer rarely   Drug use: No   Sexual activity: Never  Other Topics Concern   Not on file  Social History Narrative   Single, live by herself, good family support      lost a daughter in law   Daughter Fleet ContrasRachel lives close by   Limited Brandsretchen   [name]  Cecille Aver- husband lives near   Social Determinants of Health   Financial Resource Strain: Not on file  Food Insecurity: Not on file  Transportation Needs: Not on file  Physical Activity: Not on file  Stress: Not on file  Social Connections: Not on file  Intimate Partner Violence: Not on file    Allergies  Allergen Reactions   Sulfonamide Derivatives Other (See Comments)    Skin irritation    Family History  Problem Relation Age of Onset   Pancreatic cancer Brother    Pancreatic cancer Father    Coronary artery disease Mother 14   Breast cancer Neg Hx    Colon cancer Neg Hx     Prior to Admission medications   Medication Sig Start Date End Date Taking? Authorizing Provider  aspirin EC 81 MG tablet Take 81 mg by mouth daily.    [provider]  cholecalciferol (VITAMIN D3) 25 MCG (1000 UNIT) tablet Take  2,000 Units by mouth daily. Patient not taking: Reported on 05/27/2021    [provider]  diclofenac Sodium (VOLTAREN) 1 % GEL Apply 2 g topically 4 (four) times daily. 01/12/20   Almon Hercules, MD  escitalopram (LEXAPRO) 5 MG tablet Take 0.5 tablets (2.5 mg total) by mouth daily. 05/27/21   [name], MD  HYDROcodone-acetaminophen (NORCO/VICODIN) 5-325 MG tablet Take 1-2 tablets by mouth every 8 (eight) hours as needed for moderate pain or severe pain. 05/26/21   [name], MD  hydrocortisone 2.5 % cream Apply topically 2 (two) times daily. Patient taking differently: Apply 1 application topically 2 (two) times daily as needed (itching legs). 12/15/19   [name], MD  levothyroxine (SYNTHROID) 88 MCG tablet Take 1 tablet (88 mcg total) by mouth daily before breakfast. 12/31/20   [name], MD  polyethylene glycol (MIRALAX / GLYCOLAX) 17 g packet Take 17 g by mouth daily as needed (constipation).     [provider]    Physical Exam: Vitals:   05/30/21 1145 05/30/21 1453 05/30/21 1545 05/30/21 1654  BP: 126/87 (!) 162/65  (!) 176/54  Pulse: (!) 37 (!) 37 (!) 35 (!) 36  Resp: Temp:    98.7 F (37.1 C)  TempSrc:    Oral  SpO2: 98% 98% 98% 96%  Weight:      Height:         General:  Appears calm and comfortable and is in NAD, somewhat confused Eyes:  PERRL, EOMI, normal lids, iris ENT:   hard of hearing, grossly normal lips & tongue, mmm; suboptimal/artificial dentition Neck:  no LAD, masses or thyromegaly Cardiovascular:  Irregularly irregular and slow, in the 30s, no m/r/g. No LE edema.  Respiratory:   CTA bilaterally with no wheezes/rales/rhonchi.  Normal respiratory effort. Abdomen:  soft, NT, ND Skin:  no rash or induration seen on limited exam Musculoskeletal:  grossly normal tone BUE/BLE, good ROM, no bony abnormality Lower extremity:  No LE edema.  Limited foot exam with no ulcerations.  2+ distal pulses. Psychiatric:  blunted mood and affect,  speech fluent and appropriate, AOx3 Neurologic:  CN 2-12 grossly intact, moves all extremities in coordinated fashion    Radiological Exams on Admission: Independently reviewed - see discussion in A/P where applicable  MR THORACIC SPINE WO CONTRAST  Result Date: 05/30/2021 CLINICAL DATA:  Recurrent back pain. History of prior thoracolumbar compression fractures status post augmentation. EXAM: MRI THORACIC AND LUMBAR SPINE WITHOUT CONTRAST  TECHNIQUE: Multiplanar and multiecho pulse sequences of the thoracic and lumbar spine were obtained without intravenous contrast. COMPARISON:  MRI thoracic and lumbar spine dated January 28, 2020. FINDINGS: MRI THORACIC SPINE FINDINGS Alignment: Unchanged mild dextrocurvature of the mid to lower thoracic spine. No listhesis. Vertebrae: No acute fracture, evidence of discitis, or bone lesion. Chronic T10 and T12 compression deformity status post augmentation. Cord:  Normal signal and morphology. Paraspinal and other soft tissues: Negative. Disc levels: Unchanged minimal disc bulging at T11-T12 and scattered mild thoracic facet arthropathy. No spinal canal or neuroforaminal stenosis. MRI LUMBAR SPINE FINDINGS Segmentation:  Standard. Alignment:  Unchanged mild levoscoliosis.  No significant listhesis. Vertebrae: New acute L3 superior endplate compression fracture with minimal height loss, approximately 10%. Chronic mild L4 superior endplate compression fracture, new since March 2021. Unchanged chronic L1 and L2 compression deformities status post augmentation. Unchanged chronic mild L5 compression deformity. No evidence of discitis or suspicious bone lesion. Conus medullaris and cauda equina: Conus extends to the L1 level. Conus and cauda equina appear normal. Paraspinal and other soft tissues: Negative. Disc levels: T12-L1: No significant disc bulge or herniation. Unchanged minimal retrolisthesis of the L1 posterosuperior endplate. No stenosis. L1-L2:  Unchanged mild  left-sided disc bulging.  No stenosis. L2-L3:  Unchanged mild left-sided disc bulging.  No stenosis. L3-L4: Unchanged mild disc bulging and bilateral facet arthropathy. No stenosis. L4-L5: Unchanged mild disc bulging. Unchanged mild bilateral facet arthropathy. No stenosis. L5-S1: Unchanged minimal disc bulging and small left foraminal disc osteophyte complex. Unchanged mild left facet arthropathy. No stenosis. IMPRESSION: 1. Acute L3 superior endplate compression fracture with minimal height loss. 2. Chronic mild L4 superior endplate compression fracture, new since March 2021. 3. Chronic T10, T12, L1, and L2 compression fractures status post augmentation. 4. Unchanged mild multilevel degenerative changes of the thoracolumbar spine without stenosis. Electronically Signed   By: Obie Dredge M.D.   On: 05/30/2021 11:46   MR LUMBAR SPINE WO CONTRAST  Result Date: 05/30/2021 CLINICAL DATA:  Recurrent back pain. History of prior thoracolumbar compression fractures status post augmentation. EXAM: MRI THORACIC AND LUMBAR SPINE WITHOUT CONTRAST TECHNIQUE: Multiplanar and multiecho pulse sequences of the thoracic and lumbar spine were obtained without intravenous contrast. COMPARISON:  MRI thoracic and lumbar spine dated January 28, 2020. FINDINGS: MRI THORACIC SPINE FINDINGS Alignment: Unchanged mild dextrocurvature of the mid to lower thoracic spine. No listhesis. Vertebrae: No acute fracture, evidence of discitis, or bone lesion. Chronic T10 and T12 compression deformity status post augmentation. Cord:  Normal signal and morphology. Paraspinal and other soft tissues: Negative. Disc levels: Unchanged minimal disc bulging at T11-T12 and scattered mild thoracic facet arthropathy. No spinal canal or neuroforaminal stenosis. MRI LUMBAR SPINE FINDINGS Segmentation:  Standard. Alignment:  Unchanged mild levoscoliosis.  No significant listhesis. Vertebrae: New acute L3 superior endplate compression fracture with minimal  height loss, approximately 10%. Chronic mild L4 superior endplate compression fracture, new since March 2021. Unchanged chronic L1 and L2 compression deformities status post augmentation. Unchanged chronic mild L5 compression deformity. No evidence of discitis or suspicious bone lesion. Conus medullaris and cauda equina: Conus extends to the L1 level. Conus and cauda equina appear normal. Paraspinal and other soft tissues: Negative. Disc levels: T12-L1: No significant disc bulge or herniation. Unchanged minimal retrolisthesis of the L1 posterosuperior endplate. No stenosis. L1-L2:  Unchanged mild left-sided disc bulging.  No stenosis. L2-L3:  Unchanged mild left-sided disc bulging.  No stenosis. L3-L4: Unchanged mild disc bulging and bilateral facet arthropathy. No stenosis. L4-L5:  Unchanged mild disc bulging. Unchanged mild bilateral facet arthropathy. No stenosis. L5-S1: Unchanged minimal disc bulging and small left foraminal disc osteophyte complex. Unchanged mild left facet arthropathy. No stenosis. IMPRESSION: 1. Acute L3 superior endplate compression fracture with minimal height loss. 2. Chronic mild L4 superior endplate compression fracture, new since March 2021. 3. Chronic T10, T12, L1, and L2 compression fractures status post augmentation. 4. Unchanged mild multilevel degenerative changes of the thoracolumbar spine without stenosis. Electronically Signed   By: Obie Dredge M.D.   On: 05/30/2021 11:46    EKG: Independently reviewed.  Afib with rate 32; unchanged from prior   Labs on Admission: I have personally reviewed the available labs and imaging studies at the time of the admission.  Pertinent labs:   None done   Assessment/Plan Principal Problem:   Compression fracture of lumbar vertebra, closed, initial encounter (HCC) Active Problems:   Hyperlipidemia   Bradycardia   Senile dementia uncomp, without behavioral disturbance (HCC)   Hypothyroid   L3 compression  fracture -Mechanical twisting resulting in lumbar compression fracture with prior h/o the same with good improvement from kyphoplasty -IR consult in AM for kyphoplasty - specifically requests Dr. Corliss Skains -Will not make patient NPO at this time since insurance authorization is usually required and this can take several days -Kyphoplasty is indicated for patients with a >15% height loss with fracture in <3 months as well as for acute, intractable pain associated with fracture -Start Lovenox for DVT prophylaxis -Pain control with Norco, morphine and lidocaine patch pre-procedure -May need PT evaluation post-procedure but currently unable to participate due to pain -Had a prescription for bisphosphonate - likely needs medication for osteoporosis -Neurosurgery does not see an indication for consultation at this time, per EDP  Dementia -Continue Lexapro -Family counseled about how patients with dementia often do better behaviorally while hospitalized if family members are present overnight   Afib with bradycardia -Chronic bradycardia, stable, no symptoms - will not monitor on telemetry (daughter is in agreement) -On ASA instead of AC; will hold for now pending procedure  DM -Prior A1c was 5.7 -Not on medications -Current glucose is 110 -No treatment at this time  HLD -She does not appear to be taking medications for this issue at this time   Hypothyroidism -Normal TSH in 09/2020 -Continue Synthroid at current dose for now      Note: This patient has been tested and is negative for the novel coronavirus COVID-19. The patient has been fully vaccinated against COVID-19.   Level of care: Med-Surg DVT prophylaxis:  Lovenox Code Status:  Full - confirmed with patient/family Family Communication: Daughter was present throughout evaluation Disposition Plan:  The patient is from: home  Anticipated d/c is to: home without [facility] services   Anticipated d/c date will depend on clinical  response to treatment, likely several days while awaiting insurance approval and procedure  Patient is currently: acutely ill Consults called: IR; neurosurgery by telephone only Admission status:  Admit - It is my clinical opinion that admission to INPATIENT is reasonable and necessary because of the expectation that this patient will require hospital care that crosses at least 2 midnights to treat this condition based on the medical complexity of the problems presented.  Given the aforementioned information, the predictability of an adverse outcome is felt to be significant.    Jonah Blue MD [facility]   How to contact the [facility] Attending or Consulting provider 7A - 7P or covering provider during after hours 7P -7A,  for this patient?  Check the care team in [facility] and look for a) attending/consulting [facility] provider listed and b) the [facility] team listed Log into [email or web address] and use [facility]'s universal password to access. If you do not have the password, please contact the hospital operator. Locate the [facility] provider you are looking for under [facility] and page to a number that you can be directly reached. If you still have difficulty reaching the provider, please page the [facility] (Director on Call) for the Hospitalists listed on amion for assistance.   05/30/2021, 5:23 PM

## 2021-05-30 NOTE — ED Triage Notes (Signed)
Pt arrived to ED via [facility] from home w/ c/o acute on chronic back pain. EMS reports pt has osteoarthritis and that pt's daughter thinks pt may have a compression fx in her upper lumbar region. Increased pain w/ movement. Pt is bedbound. VSS w/ EMS. EMS reports pt's baseline HR is in the 30's. Pt brought to [facility] d/t her "neuro" MD is in [place]. Pt is A&Ox3 w/ upon arrival to ED and is disoriented to time. Pt agitated when asking orientation questions.

## 2021-05-30 NOTE — ED Notes (Signed)
Pt transported to MRI 

## 2021-05-30 NOTE — ED Notes (Signed)
Pt back from MRI 

## 2021-05-30 NOTE — Progress Notes (Signed)
IR was requested for L3 kyphoplasty.   Case was reviewed and approved by Dr. Corliss Skains.  IR schedulers contacted for insurance approval.  Will set the procedure up once insurance approval is obtained, hopefully in next couple days.   Formal consult to follow.   Please call IR for questions and concerns.    Lynann Bologna Asha Grumbine PA-C 05/30/2021 4:13 PM

## 2021-05-30 NOTE — ED Notes (Signed)
Educated pt and pt's daughter that heart monitor is going to make noise d/t pt being bradycardic in the 30's and educated on the importance of pt keeping monitor on. [name] verbalized understanding.

## 2021-05-30 NOTE — ED Notes (Signed)
Yates MD at bedside 

## 2021-05-30 NOTE — ED Notes (Signed)
Pts daughter at bedside  

## 2021-05-30 NOTE — ED Provider Notes (Signed)
[facility] EMERGENCY DEPARTMENT Provider Note   CSN: [id number] Arrival date & time: 05/30/21  2025     History Chief Complaint  Patient presents with   Back Pain    Amy Weiss is a 86 y.o. female.  HPI  86 year old female with past medical history of HLD, DM, TIA, bradycardia, generalized weakness, dementia presents the emergency department with mid to lower back pain.  Daughter is at bedside and the primary history giver as the patient is intermittently confused and currently has no complaints.  The daughter states that this is baseline for her.  Patient has history of previous compression fractures treated with kyphoplasty.  She states couple days ago the patient was standing, lifting a couch cushion when she had sudden onset lower back pain.  Since then the pain has been uncontrolled, she is now unable to stand, pivot and use a bedside commode.  The daughter has concerned that she has sustained another compression fracture.  The patient denies any focal numbness or weakness to lower extremities, no saddle anesthesia, no difficulty with using the restroom/incontinence.  No recent fever or other acute illness.  Past Medical History:  Diagnosis Date   Diabetes mellitus 4/09   A1C-6   Familial tremor    Hyperlipidemia    Hypothyroidism    Osteoporosis    Tachycardia    AV Re-entry, s/p ablation aprox 2005   TIA (transient ischemic attack) 12/09    Patient Active Problem List   Diagnosis Date Noted   Malnutrition of moderate degree 01/31/2020   Closed wedge compression fracture of T10 vertebra (HCC) 01/28/2020   Weight loss 01/28/2020   Abnormal LFTs 01/28/2020   Weakness generalized    DNR (do not resuscitate)    Cognitive changes    Senile dementia uncomp, without behavioral disturbance (HCC) 01/06/2020   Hypothyroid 01/06/2020   Back pain 01/02/2020   Palliative care by specialist    Encounter for hospice care discussion    Bradycardia  12/24/2019   Lumbar compression fracture, closed, initial encounter (HCC) 12/24/2019   Lumbar compression fracture (HCC) 12/23/2019   Acute bilateral low back pain without sciatica 11/26/2019   PCP NOTES >>>> 10/18/2015   Atrioventricular block, Mobitz type 1, Wenckebach 12/10/2014   Eczema 12/29/2011   Annual physical exam 05/09/2011   SKIN LESION 05/06/2010   Hyperglycemia 11/19/2008   TIA 11/19/2008   Hyperlipidemia 03/04/2008   FAMILIAL TREMOR 03/04/2008   Arrhythmia--h/o AV reentry tachycardia 03/04/2008   ARTHRALGIA 03/04/2008   Osteoporosis 03/04/2008   Closed compression fracture of L2 lumbar vertebra, initial encounter (HCC) 04/05/2007    Past Surgical History:  Procedure Laterality Date   ABDOMINAL HYSTERECTOMY     no oophorectomy   BLADDER SURGERY     x 2 in the 80s   BREAST BIOPSY     L (-)   IR KYPHO EA ADDL LEVEL THORACIC OR LUMBAR  12/29/2019   IR KYPHO LUMBAR INC FX REDUCE BONE BX UNI/BIL CANNULATION INC/IMAGING  01/06/2020   IR KYPHO THORACIC WITH BONE BIOPSY  12/29/2019   IR KYPHO THORACIC WITH BONE BIOPSY  02/02/2020     OB History   No obstetric history on file.     Family History  Problem Relation Age of Onset   Pancreatic cancer Brother    Pancreatic cancer Father    Coronary artery disease Mother 57   Breast cancer Neg Hx    Colon cancer Neg Hx     Social History  Tobacco Use   Smoking status: Former    Pack years: 0.00    Types: Cigarettes    Quit date: 06/20/2012    Years since quitting: 8.9   Smokeless tobacco: Never  Substance Use Topics   Alcohol use: Yes    Comment: beer rarely   Drug use: No    Home Medications Prior to Admission medications   Medication Sig Start Date End Date Taking? Authorizing Provider  aspirin EC 81 MG tablet Take 81 mg by mouth daily.    [provider]  cholecalciferol (VITAMIN D3) 25 MCG (1000 UNIT) tablet Take 2,000 Units by mouth daily. Patient not taking: Reported on 05/27/2021    [provider]  diclofenac Sodium (VOLTAREN) 1 % GEL Apply 2 g topically 4 (four) times daily. 01/12/20   Almon Hercules, MD  escitalopram (LEXAPRO) 5 MG tablet Take 0.5 tablets (2.5 mg total) by mouth daily. 05/27/21   [name], MD  HYDROcodone-acetaminophen (NORCO/VICODIN) 5-325 MG tablet Take 1-2 tablets by mouth every 8 (eight) hours as needed for moderate pain or severe pain. 05/26/21   [name], MD  hydrocortisone 2.5 % cream Apply topically 2 (two) times daily. Patient taking differently: Apply 1 application topically 2 (two) times daily as needed (itching legs). 12/15/19   [name], MD  levothyroxine (SYNTHROID) 88 MCG tablet Take 1 tablet (88 mcg total) by mouth daily before breakfast. 12/31/20   [name], MD  polyethylene glycol (MIRALAX / GLYCOLAX) 17 g packet Take 17 g by mouth daily as needed (constipation).     [provider]    Allergies    Sulfonamide derivatives  Review of Systems   Review of Systems  Constitutional:  Negative for chills and fever.  HENT:  Negative for congestion.   Eyes:  Negative for visual disturbance.  Respiratory:  Negative for shortness of breath.   Cardiovascular:  Negative for chest pain.  Gastrointestinal:  Negative for abdominal pain, diarrhea and vomiting.  Genitourinary:  Negative for dysuria.  Musculoskeletal:  Positive for back pain. Negative for neck pain.  Skin:  Negative for rash.  Neurological:  Negative for headaches.   Physical Exam Updated Vital Signs BP (!) 179/56   Pulse (!) 31   Temp 97.8 F (36.6 C) (Oral)   Resp 11   Ht 5\' 7"  (1.702 m)   Wt 68 kg   SpO2 95%   BMI 23.49 kg/m   Physical Exam Vitals and nursing note reviewed.  Constitutional:      Appearance: Normal appearance.  HENT:     Head: Normocephalic.     Mouth/Throat:     Mouth: Mucous membranes are moist.  Cardiovascular:     Rate and Rhythm: Normal rate.  Pulmonary:     Effort: Pulmonary effort is normal. No respiratory distress.   Abdominal:     Palpations: Abdomen is soft.     Tenderness: There is no abdominal tenderness.  Musculoskeletal:        General: No swelling or deformity.  Skin:    General: Skin is warm.  Neurological:     Mental Status: She is alert and oriented to person, place, and time. Mental status is at baseline.     Comments: Weakness in the BLE  Psychiatric:        Mood and Affect: Mood normal.    ED Results / Procedures / Treatments   Labs (all labs ordered are listed, but only abnormal results are displayed)  Labs Reviewed - No data to display  EKG None  Radiology No results found.  Procedures Procedures   Medications Ordered in ED Medications  LORazepam (ATIVAN) tablet 0.5 mg (has no administration in time range)    ED Course  I have reviewed the triage vital signs and the nursing notes.  Pertinent labs & imaging results that were available during my care of the patient were reviewed by me and considered in my medical decision making (see chart for details).    MDM Rules/Calculators/A&P                          86 year old female presents emergency department with new onset back pain and weakness/debility.  Of note she is bradycardic but this is baseline.  MRI shows a new acute L3 superior endplate compression fracture with minimal height loss.  Spoke with neurosurgery who states no acute intervention on their own.  They recommend lumbosacral corset and outpatient follow-up.  Patient has had IR kyphoplasty before with Dr. Corliss Skains.  She will be admitted for pain control and further evaluation/care.  Patients evaluation and results requires admission for further treatment and care. Patient agrees with admission plan, offers no new complaints and is stable/unchanged at time of admit.  Final Clinical Impression(s) / ED Diagnoses Final diagnoses:  None    Rx / DC Orders ED Discharge Orders     None        Rozelle Logan, DO 05/30/21 1438

## 2021-05-30 NOTE — Plan of Care (Signed)

## 2021-05-30 NOTE — ED Notes (Signed)
[name] questioning need to place monitoring equipment back on pt. Explained that monitoring equipment is necessary to keep track of pt VS. [name] verbalized understanding.

## 2021-05-31 ENCOUNTER — Encounter (HOSPITAL_COMMUNITY): Payer: Self-pay | Admitting: Internal Medicine

## 2021-05-31 DIAGNOSIS — E785 Hyperlipidemia, unspecified: Secondary | ICD-10-CM | POA: Diagnosis not present

## 2021-05-31 DIAGNOSIS — E039 Hypothyroidism, unspecified: Secondary | ICD-10-CM | POA: Diagnosis not present

## 2021-05-31 DIAGNOSIS — S32000A Wedge compression fracture of unspecified lumbar vertebra, initial encounter for closed fracture: Secondary | ICD-10-CM | POA: Diagnosis not present

## 2021-05-31 DIAGNOSIS — R001 Bradycardia, unspecified: Secondary | ICD-10-CM | POA: Diagnosis not present

## 2021-05-31 DIAGNOSIS — F0390 Unspecified dementia without behavioral disturbance: Secondary | ICD-10-CM

## 2021-05-31 LAB — BASIC METABOLIC PANEL
Anion gap: 8 (ref 5–15)
BUN: 22 mg/dL (ref 8–23)
CO2: 24 mmol/L (ref 22–32)
Calcium: 8.5 mg/dL — ABNORMAL LOW (ref 8.9–10.3)
Chloride: 105 mmol/L (ref 98–111)
Creatinine, Ser: 0.72 mg/dL (ref 0.44–1.00)
GFR, Estimated: >60 mL/min (ref >60)
Glucose, Bld: 127 mg/dL — ABNORMAL HIGH (ref 70–99)
Potassium: 4.3 mmol/L (ref 3.5–5.1)
Sodium: 137 mmol/L (ref 135–145)

## 2021-05-31 LAB — CBC
HCT: 37.6 % (ref 36.0–46.0)
Hemoglobin: 12.2 g/dL (ref 12.0–15.0)
MCH: 28.4 pg (ref 26.0–34.0)
MCHC: 32.4 g/dL (ref 30.0–36.0)
MCV: 87.6 fL (ref 80.0–100.0)
Platelets: 228 10*3/uL (ref 150–400)
RBC: 4.29 MIL/uL (ref 3.87–5.11)
RDW: 14.6 % (ref 11.5–15.5)
WBC: 7 10*3/uL (ref 4.0–10.5)
nRBC: 0 % (ref 0.0–0.2)

## 2021-05-31 MED ORDER — CEFAZOLIN SODIUM-DEXTROSE 2-4 GM/100ML-% IV SOLN
2.0000 g | INTRAVENOUS | Status: AC
  Filled 2021-05-31: qty 100

## 2021-05-31 NOTE — Plan of Care (Signed)
  Problem: Health Behavior/Discharge Planning: Goal: Ability to manage health-related needs will improve Outcome: Not Progressing   Problem: Activity: Goal: Risk for activity intolerance will decrease Outcome: Not Progressing   Problem: Pain Managment: Goal: General experience of comfort will improve Outcome: Not Progressing   

## 2021-05-31 NOTE — Plan of Care (Signed)

## 2021-05-31 NOTE — H&P (Signed)
Chief Complaint: Patient was seen in consultation today for image guided kyphoplasty Chief Complaint  Patient presents with   Back Pain   at the request of Dr. Ophelia Charter, [name].  Referring Physician(s): Dr. Ophelia Charter, J.  Supervising Physician: Julieanne Cotton  Patient Status: [facility] - In-pt  History of Present Illness: Amy Weiss is a 86 y.o. female with past medical history of TIA, dementia, tachycardia with AV reentry s/p ablation, DM, HLD, hypothyroidism, osteoporosis with multiple prior compression fracture, well-known to Dr. Corliss Skains for previous kyphoplasties. Patient presented to [facility] ED on 05/30/2021 due to lower back pain, underwent MRI of T and L-spine which showed acute L3 superior endplate compression fracture with minimal height loss.  Patient was admitted for further evaluation and management of the lower back pain.  IR was requested for image guided kyphoplasty. Case was reviewed and approved for L3 kyphoplasty by by Dr. Corliss Skains.  Patient laying in bed, not in acute distress. Daughter at bedside.    Amy Weiss headache, fever, chills, shortness of breath, cough, chest pain, abdominal pain, nausea ,vomiting, and bleeding.   Past Medical History:  Diagnosis Date   Diabetes mellitus 4/09   A1C-6   Familial tremor    Hyperlipidemia    Hypothyroidism    Osteoporosis    Tachycardia    AV Re-entry, s/p ablation aprox 2005   TIA (transient ischemic attack) 12/09    Past Surgical History:  Procedure Laterality Date   ABDOMINAL HYSTERECTOMY     no oophorectomy   BLADDER SURGERY     x 2 in the 80s   BREAST BIOPSY     L (-)   IR KYPHO EA ADDL LEVEL THORACIC OR LUMBAR  12/29/2019   IR KYPHO LUMBAR INC FX REDUCE BONE BX UNI/BIL CANNULATION INC/IMAGING  01/06/2020   IR KYPHO THORACIC WITH BONE BIOPSY  12/29/2019   IR KYPHO THORACIC WITH BONE BIOPSY  02/02/2020    Allergies: Sulfonamide derivatives  Medications: Prior to Admission medications   Medication Sig Start Date  End Date Taking? Authorizing Provider  acetaminophen (TYLENOL) 500 MG tablet Take 500 mg by mouth every 6 (six) hours as needed (pain).   Yes [provider]  aspirin EC 81 MG tablet Take 81 mg by mouth every morning.   Yes [provider]  diclofenac Sodium (VOLTAREN) 1 % GEL Apply 2 g topically 4 (four) times daily. Patient taking differently: Apply 2 g topically 4 (four) times daily as needed (pain). 01/12/20  Yes Almon Hercules, MD  escitalopram (LEXAPRO) 5 MG tablet Take 2.5 mg by mouth every morning. 05/27/21  Yes Paz, Nolon Rod, MD  HYDROcodone-acetaminophen (NORCO/VICODIN) 5-325 MG tablet Take 1-2 tablets by mouth every 8 (eight) hours as needed for moderate pain or severe pain. Patient taking differently: Take 1 tablet by mouth every 8 (eight) hours as needed for moderate pain or severe pain. 05/26/21  Yes Paz, Nolon Rod, MD  hydrocortisone 2.5 % cream Apply topically 2 (two) times daily. Patient taking differently: Apply 1 application topically 2 (two) times daily as needed (itching legs). 12/15/19  Yes [name], MD  levothyroxine (SYNTHROID) 88 MCG tablet Take 1 tablet (88 mcg total) by mouth daily before breakfast. 12/31/20  Yes Paz, Nolon Rod, MD  polyethylene glycol (MIRALAX / GLYCOLAX) 17 g packet Take 17 g by mouth daily as needed (with each dose of Norco/hydrocodone).   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, fully retired   Tobacco Use   Smoking status: Former    Packs/day: 0.50    Years: 10.00    Pack years: 5.00    Types:  Cigarettes    Quit date: 06/20/2012    Years since quitting: 8.9   Smokeless tobacco: Never  Substance and Sexual Activity   Alcohol use: Not Currently    Comment: beer rarely   Drug use: No   Sexual activity: Never  Other Topics Concern   Not on file  Social History Narrative   Single, live by herself, good family support      lost a daughter in law   [name] lives close by   Amy Weiss   Ex- husband lives near   Social Determinants of Health   Financial Resource Strain: Not on file  Food Insecurity: Not on file  Transportation Needs: Not on file  Physical Activity: Not on file  Stress: Not on file  Social Connections: Not on file     Review of Systems: A 12 point ROS discussed and pertinent positives are indicated in the HPI above.  All other systems are negative.   Vital Signs: BP (!) 150/55 (BP Location: Right Arm)   Pulse (!) 105   Temp 98.5 F (36.9 C) (Oral)   Resp 18   Ht 5\' 7"  (1.702 m)   Wt 150 lb (68 kg)   SpO2 96%   BMI 23.49 kg/m   Physical Exam Vitals reviewed.  Constitutional:      General: She is not in acute distress.    Appearance: Normal appearance. She is not ill-appearing.  HENT:     Head: Atraumatic.     Mouth/Throat:     Mouth: Mucous membranes are moist.  Cardiovascular:     Rate and Rhythm: Normal rate and regular rhythm.     Pulses: Normal pulses.     Heart sounds: Normal heart sounds.  Pulmonary:     Effort: Pulmonary effort is normal.     Breath sounds: Normal breath sounds.  Abdominal:     General: Abdomen is flat. Bowel sounds are normal.     Palpations: Abdomen is soft.  Musculoskeletal:     Cervical back: Neck supple.     Comments: No TTP on spine per patient  Skin:    General: Skin is warm and dry.     Coloration: Skin is not jaundiced or pale.  Neurological:     Mental Status: She is alert. Mental status is at baseline.  Psychiatric:        Mood and Affect: Mood normal.        Behavior:  Behavior normal.    MD Evaluation Airway: WNL Heart: WNL Abdomen: WNL Chest/ Lungs: WNL ASA  Classification: 3 Mallampati/Airway Score: Two  Imaging: MR THORACIC SPINE WO CONTRAST  Result Date: 05/30/2021 CLINICAL DATA:  Recurrent back pain. History of prior thoracolumbar compression fractures status post augmentation. EXAM: MRI THORACIC AND LUMBAR SPINE  WITHOUT CONTRAST TECHNIQUE: Multiplanar and multiecho pulse sequences of the thoracic and lumbar spine were obtained without intravenous contrast. COMPARISON:  MRI thoracic and lumbar spine dated January 28, 2020. FINDINGS: MRI THORACIC SPINE FINDINGS Alignment: Unchanged mild dextrocurvature of the mid to lower thoracic spine. No listhesis. Vertebrae: No acute fracture, evidence of discitis, or bone lesion. Chronic T10 and T12 compression deformity status post augmentation. Cord:  Normal signal and morphology. Paraspinal and other soft tissues: Negative. Disc levels: Unchanged minimal disc bulging at T11-T12 and scattered mild thoracic facet arthropathy. No spinal canal or neuroforaminal stenosis. MRI LUMBAR SPINE FINDINGS Segmentation:  Standard. Alignment:  Unchanged mild levoscoliosis.  No significant listhesis. Vertebrae: New acute L3 superior endplate compression fracture with minimal height loss, approximately 10%. Chronic mild L4 superior endplate compression fracture, new since March 2021. Unchanged chronic L1 and L2 compression deformities status post augmentation. Unchanged chronic mild L5 compression deformity. No evidence of discitis or suspicious bone lesion. Conus medullaris and cauda equina: Conus extends to the L1 level. Conus and cauda equina appear normal. Paraspinal and other soft tissues: Negative. Disc levels: T12-L1: No significant disc bulge or herniation. Unchanged minimal retrolisthesis of the L1 posterosuperior endplate. No stenosis. L1-L2:  Unchanged mild left-sided disc bulging.  No stenosis. L2-L3:  Unchanged mild left-sided  disc bulging.  No stenosis. L3-L4: Unchanged mild disc bulging and bilateral facet arthropathy. No stenosis. L4-L5: Unchanged mild disc bulging. Unchanged mild bilateral facet arthropathy. No stenosis. L5-S1: Unchanged minimal disc bulging and small left foraminal disc osteophyte complex. Unchanged mild left facet arthropathy. No stenosis. IMPRESSION: 1. Acute L3 superior endplate compression fracture with minimal height loss. 2. Chronic mild L4 superior endplate compression fracture, new since March 2021. 3. Chronic T10, T12, L1, and L2 compression fractures status post augmentation. 4. Unchanged mild multilevel degenerative changes of the thoracolumbar spine without stenosis. Electronically Signed   By: Obie Dredge M.D.   On: 05/30/2021 11:46   MR LUMBAR SPINE WO CONTRAST  Result Date: 05/30/2021 CLINICAL DATA:  Recurrent back pain. History of prior thoracolumbar compression fractures status post augmentation. EXAM: MRI THORACIC AND LUMBAR SPINE WITHOUT CONTRAST TECHNIQUE: Multiplanar and multiecho pulse sequences of the thoracic and lumbar spine were obtained without intravenous contrast. COMPARISON:  MRI thoracic and lumbar spine dated January 28, 2020. FINDINGS: MRI THORACIC SPINE FINDINGS Alignment: Unchanged mild dextrocurvature of the mid to lower thoracic spine. No listhesis. Vertebrae: No acute fracture, evidence of discitis, or bone lesion. Chronic T10 and T12 compression deformity status post augmentation. Cord:  Normal signal and morphology. Paraspinal and other soft tissues: Negative. Disc levels: Unchanged minimal disc bulging at T11-T12 and scattered mild thoracic facet arthropathy. No spinal canal or neuroforaminal stenosis. MRI LUMBAR SPINE FINDINGS Segmentation:  Standard. Alignment:  Unchanged mild levoscoliosis.  No significant listhesis. Vertebrae: New acute L3 superior endplate compression fracture with minimal height loss, approximately 10%. Chronic mild L4 superior endplate compression  fracture, new since March 2021. Unchanged chronic L1 and L2 compression deformities status post augmentation. Unchanged chronic mild L5 compression deformity. No evidence of discitis or suspicious bone lesion. Conus medullaris and cauda equina: Conus extends to the L1 level. Conus and cauda equina appear normal. Paraspinal and other soft tissues: Negative. Disc levels: T12-L1: No significant disc bulge or herniation. Unchanged minimal retrolisthesis of the L1 posterosuperior endplate. No stenosis. L1-L2:  Unchanged mild left-sided disc bulging.  No stenosis. L2-L3:  Unchanged mild left-sided disc bulging.  No stenosis. L3-L4: Unchanged mild disc bulging and bilateral facet arthropathy. No  stenosis. L4-L5: Unchanged mild disc bulging. Unchanged mild bilateral facet arthropathy. No stenosis. L5-S1: Unchanged minimal disc bulging and small left foraminal disc osteophyte complex. Unchanged mild left facet arthropathy. No stenosis. IMPRESSION: 1. Acute L3 superior endplate compression fracture with minimal height loss. 2. Chronic mild L4 superior endplate compression fracture, new since March 2021. 3. Chronic T10, T12, L1, and L2 compression fractures status post augmentation. 4. Unchanged mild multilevel degenerative changes of the thoracolumbar spine without stenosis. Electronically Signed   By: Obie DredgeWilliam T Derry M.D.   On: 05/30/2021 11:46    Labs:  CBC: Recent Labs    10/11/20 1218 05/30/21 1439 05/31/21 0201  WBC 5.5 7.1 7.0  HGB 13.2 13.4 12.2  HCT 40.2 41.2 37.6  PLT 241.0 244 228    COAGS: No results for input(s): INR, APTT in the last 8760 hours.  BMP: Recent Labs    10/11/20 1218 05/30/21 1439 05/31/21 0201  NA 139 137 137  K 4.4 4.0 4.3  CL 105 106 105  CO2 29 27 24   GLUCOSE 117* 110* 127*  BUN 20 16 22   CALCIUM 8.9 8.7* 8.5*  CREATININE 0.75 0.76 0.72  GFRNONAA  --  >60 >60    LIVER FUNCTION TESTS: Recent Labs    10/11/20 1218  BILITOT 0.6  AST 20  ALT 9  ALKPHOS 119*   PROT 6.9  ALBUMIN 3.7    TUMOR MARKERS: No results for input(s): AFPTM, CEA, CA199, CHROMGRNA in the last 8760 hours.  Assessment and Plan: 86 y.o. female with osteoporosis and previous compression fracture, well-known to Dr. Corliss Skainseveshwar for previous kyphoplasties.  [facility]/30/2021 due to lower back pain and currently admitted for further evaluation and management of the lower back pain.  IR was requested for image guided kyphoplasty. Case was reviewed and approved for L3 kyphoplasty by Dr. Corliss Skainseveshwar.  Insurance approval received on 05/31/2021, the procedure is tentatively scheduled for tomorrow 06/01/2021 pending NIR schedule.  N.p.o. at midnight Held Lovenox Last INR obtained on 01/29/2020, repeat INR ordered for tomorrow a.m. CBC on 05/31/2021 all within normal limit BMP with hypocalcemia 8.5 Vital sign afebrile but hypertensive and tachycardic at heart rate 105, will double check vital signs prior to procedure tomorrow.  Patient has history of dementia, procedure was discussed with her daughter Ms. Alinda Sierrasobin Baker over phone.  Risks and benefits of L3 kyphoplasty were discussed with the patient's daughter including, but not limited to education regarding the natural healing process of compression fractures without intervention, bleeding, infection, cement migration which may cause spinal cord damage, paralysis, pulmonary embolism or even death.  This interventional procedure involves the use of X-rays and because of the nature of the planned procedure, it is possible that we will have prolonged use of X-ray fluoroscopy.  Potential radiation risks to you include (but are not limited to) the following: - A slightly elevated risk for cancer  several years later in life. This risk is typically less than 0.5% percent. This risk is low in comparison to the normal incidence of human cancer, which is 33% for women and 50% for men according to the American  Can[employer] Society. - Radiation induced injury can include skin redness, resembling a rash, tissue breakdown / ulcers and hair loss (which can be temporary or permanent).   The likelihood of either of these occurring depends on the difficulty of the procedure and whether you are sensitive to radiation due to previous procedures, disease, or genetic conditions.   IF  your procedure requires a prolonged use of radiation, you will be notified and given written instructions for further action.  It is your responsibility to monitor the irradiated area for the 2 weeks following the procedure and to notify your physician if you are concerned that you have suffered a radiation induced injury.    All of the daughter's questions were answered, she is agreeable to proceed.  Consent signed and in chart.   Thank you for this interesting consult.  I greatly enjoyed meeting Lenea AMEL KITCH and look forward to participating in their care.  A copy of this report was sent to the requesting provider on this date.  Electronically Signed: Willette Brace, PA-C 05/31/2021, 1:09 PM   I spent a total of 20 Minutes   in face to face in clinical consultation, greater than 50% of which was counseling/coordinating care for L3 kyphoplasty

## 2021-05-31 NOTE — Progress Notes (Signed)
PROGRESS NOTE  Abbott Paollenor J Odonoghue WUJ:[id number]:[id number] DOB: 1935/08/01 DOA: 05/30/2021 PCP: Wanda PlumpPaz, Jose E, MD   LOS: 1 day   Brief Narrative / Interim history: 86 year old female with history of DM2, HLD, dementia, prior compression fractures treated with kyphoplasty, hypothyroidism comes to the hospital with back pain.  She was lifting the cushion off a chair and all of a sudden felt sudden onset of back pain.  Was severe to the point that she was unable to ambulate.  Daughter brought her to the hospital, underwent an MRI which showed new L3 compression fracture.  IR consulted, she was approved for kyphoplasty and now awaiting insurance approval  Subjective / 24h Interval events: In bed, she is doing well this morning.  Complains of back pain.  No chest pain, no shortness of breath, no abdominal pain, no nausea or vomiting.  No fever or chills, no dysuria.  Daughter is at bedside.  Assessment & Plan: Principal Problem Severe back pain, L3 compression fracture-she has had kyphoplasty's in the past with excellent success.  IR consulted, it appears that Dr Corliss Skainseveshwar approved kyphoplasty pending insurance authorization -Mobilize as able, continue pain control with lidocaine patch, hydrocodone / acetaminophen  Active Problems Dementia-continue Lexapro, at risk for delirium and daughter spends the night here.  Continue standard delirium management with frequent reorientation, as much sunlight as possible during the daytime, encourage early mobility  Chronic A. fib with bradycardia-chronic bradycardia, stable, no symptoms.  Hold anticoagulation pending procedure  Type 2 diabetes mellitus-diet controlled, prior A1c was 5.7.  Monitor periodically with morning labs  Hypothyroidism-continue Synthroid  Scheduled Meds:  docusate sodium  100 mg Oral BID   enoxaparin (LOVENOX) injection  40 mg Subcutaneous Q24H   escitalopram  2.5 mg Oral Daily   levothyroxine  88 mcg Oral QAC breakfast   lidocaine  1 patch  Transdermal Q24H   Continuous Infusions: PRN Meds:.acetaminophen **OR** acetaminophen, bisacodyl, hydrALAZINE, HYDROcodone-acetaminophen, morphine injection, ondansetron **OR** ondansetron (ZOFRAN) IV, polyethylene glycol  Diet Orders (From admission, onward)     Start     Ordered   05/30/21 1531  Diet regular Room service appropriate? Yes; Fluid consistency: Thin  Diet effective now       Question Answer Comment  Room service appropriate? Yes   Fluid consistency: Thin      05/30/21 1531            DVT prophylaxis: enoxaparin (LOVENOX) injection 40 mg Start: 05/30/21 1815     Code Status: Full Code  Family Communication: Daughter was present at bedside  Status is: Inpatient  Remains inpatient appropriate because:Ongoing diagnostic testing needed not appropriate for outpatient work up, Unsafe d/c plan, and Inpatient level of care appropriate due to severity of illness  Dispo: The patient is from: Home              Anticipated d/c is to: Home              Patient currently is not medically stable to d/c.   Difficult to place patient No   Level of care: Med-Surg  Consultants:  Interventional radiology  Procedures:  None   Microbiology  None   Antimicrobials: None     Objective: Vitals:   05/30/21 2215 05/31/21 0100 05/31/21 0500 05/31/21 0735  BP: (!) 116/57 (!) 111/45 (!) 115/58 (!) 150/55  Pulse: (!) 44 (!) 48 (!) 50 (!) 105  Resp: 15 16 15 18   Temp: 97.6 F (36.4 C) 97.7 F (36.5 C) 97.7 F (36.5 C)  98.5 F (36.9 C)  TempSrc: Oral Oral Oral Oral  SpO2: 94% 92% 95% 96%  Weight:      Height:        Intake/Output Summary (Last 24 hours) at 05/31/2021 1012 Last data filed at 05/31/2021 0300 Gross per 24 hour  Intake --  Output 300 ml  Net -300 ml   Filed Weights   05/30/21 0746  Weight: 68 kg    Examination:  Constitutional: NAD Eyes: no scleral icterus ENMT: Mucous membranes are moist.  Neck: normal, supple Respiratory: clear to  auscultation bilaterally, no wheezing, no crackles. Normal respiratory effort. No accessory muscle use.  Cardiovascular: Regular rate and rhythm, no murmurs / rubs / gallops. No LE edema.  Abdomen: non distended, no tenderness. Bowel sounds positive.  Musculoskeletal: no clubbing / cyanosis.  Skin: no rashes Neurologic: CN 2-12 grossly intact. Strength 5/5 in all 4.   Data Reviewed: I have independently reviewed following labs and imaging studies   CBC: Recent Labs  Lab 05/30/21 1439 05/31/21 0201  WBC 7.1 7.0  NEUTROABS 4.1  --   HGB 13.4 12.2  HCT 41.2 37.6  MCV 89.2 87.6  PLT 244 228   Basic Metabolic Panel: Recent Labs  Lab 05/30/21 1439 05/31/21 0201  NA 137 137  K 4.0 4.3  CL 106 105  CO2 27 24  GLUCOSE 110* 127*  BUN 16 22  CREATININE 0.76 0.72  CALCIUM 8.7* 8.5*   Liver Function Tests: No results for input(s): AST, ALT, ALKPHOS, BILITOT, PROT, ALBUMIN in the last 168 hours. Coagulation Profile: No results for input(s): INR, PROTIME in the last 168 hours. HbA1C: No results for input(s): HGBA1C in the last 72 hours. CBG: No results for input(s): GLUCAP in the last 168 hours.  Recent Results (from the past 240 hour(s))  Resp Panel by RT-PCR (Flu A&B, Covid) Nasopharyngeal Swab     Status: None   Collection Time: 05/30/21  2:55 PM   Specimen: Nasopharyngeal Swab; Nasopharyngeal(NP) swabs in vial transport medium  Result Value Ref Range Status   SARS Coronavirus 2 by RT PCR NEGATIVE NEGATIVE Final    Comment: (NOTE) SARS-CoV-2 target nucleic acids are NOT DETECTED.  The SARS-CoV-2 RNA is generally detectable in upper respiratory specimens during the acute phase of infection. The lowest concentration of SARS-CoV-2 viral copies this assay can detect is 138 copies/mL. A negative result does not preclude SARS-Cov-2 infection and should not be used as the sole basis for treatment or other patient management decisions. A negative result may occur with  improper  specimen collection/handling, submission of specimen other than nasopharyngeal swab, presence of viral mutation(s) within the areas targeted by this assay, and inadequate number of viral copies(<138 copies/mL). A negative result must be combined with clinical observations, patient history, and epidemiological information. The expected result is Negative.  Fact Sheet for Patients:  BloggerCourse.com  Fact Sheet for Healthcare Providers:  SeriousBroker.it  This test is no t yet approved or cleared by the [place] FDA and  has been authorized for detection and/or diagnosis of SARS-CoV-2 by FDA under an Emergency Use Authorization (EUA). This EUA will remain  in effect (meaning this test can be used) for the duration of the COVID-19 declaration under Section 564(b)(1) of the Act, 21 U.S.C.section 360bbb-3(b)(1), unless the authorization is terminated  or revoked sooner.       Influenza A by PCR NEGATIVE NEGATIVE Final   Influenza B by PCR NEGATIVE NEGATIVE Final    Comment: (NOTE) The  Xpert Xpress SARS-CoV-2/FLU/RSV plus assay is intended as an aid in the diagnosis of influenza from Nasopharyngeal swab specimens and should not be used as a sole basis for treatment. Nasal washings and aspirates are unacceptable for Xpert Xpress SARS-CoV-2/FLU/RSV testing.  Fact Sheet for Patients: BloggerCourse.com  Fact Sheet for Healthcare Providers: SeriousBroker.it  This test is not yet approved or cleared by the [place] FDA and has been authorized for detection and/or diagnosis of SARS-CoV-2 by FDA under an Emergency Use Authorization (EUA). This EUA will remain in effect (meaning this test can be used) for the duration of the COVID-19 declaration under Section 564(b)(1) of the Act, 21 U.S.C. section 360bbb-3(b)(1), unless the authorization is terminated or revoked.  Performed at  [facility], [address] [place], [place] [address]      Radiology Studies: MR THORACIC SPINE WO CONTRAST  Result Date: 05/30/2021 CLINICAL DATA:  Recurrent back pain. History of prior thoracolumbar compression fractures status post augmentation. EXAM: MRI THORACIC AND LUMBAR SPINE WITHOUT CONTRAST TECHNIQUE: Multiplanar and multiecho pulse sequences of the thoracic and lumbar spine were obtained without intravenous contrast. COMPARISON:  MRI thoracic and lumbar spine dated January 28, 2020. FINDINGS: MRI THORACIC SPINE FINDINGS Alignment: Unchanged mild dextrocurvature of the mid to lower thoracic spine. No listhesis. Vertebrae: No acute fracture, evidence of discitis, or bone lesion. Chronic T10 and T12 compression deformity status post augmentation. Cord:  Normal signal and morphology. Paraspinal and other soft tissues: Negative. Disc levels: Unchanged minimal disc bulging at T11-T12 and scattered mild thoracic facet arthropathy. No spinal canal or neuroforaminal stenosis. MRI LUMBAR SPINE FINDINGS Segmentation:  Standard. Alignment:  Unchanged mild levoscoliosis.  No significant listhesis. Vertebrae: New acute L3 superior endplate compression fracture with minimal height loss, approximately 10%. Chronic mild L4 superior endplate compression fracture, new since March 2021. Unchanged chronic L1 and L2 compression deformities status post augmentation. Unchanged chronic mild L5 compression deformity. No evidence of discitis or suspicious bone lesion. Conus medullaris and cauda equina: Conus extends to the L1 level. Conus and cauda equina appear normal. Paraspinal and other soft tissues: Negative. Disc levels: T12-L1: No significant disc bulge or herniation. Unchanged minimal retrolisthesis of the L1 posterosuperior endplate. No stenosis. L1-L2:  Unchanged mild left-sided disc bulging.  No stenosis. L2-L3:  Unchanged mild left-sided disc bulging.  No stenosis. L3-L4: Unchanged mild disc bulging and  bilateral facet arthropathy. No stenosis. L4-L5: Unchanged mild disc bulging. Unchanged mild bilateral facet arthropathy. No stenosis. L5-S1: Unchanged minimal disc bulging and small left foraminal disc osteophyte complex. Unchanged mild left facet arthropathy. No stenosis. IMPRESSION: 1. Acute L3 superior endplate compression fracture with minimal height loss. 2. Chronic mild L4 superior endplate compression fracture, new since March 2021. 3. Chronic T10, T12, L1, and L2 compression fractures status post augmentation. 4. Unchanged mild multilevel degenerative changes of the thoracolumbar spine without stenosis. Electronically Signed   By: Obie Dredge M.D.   On: 05/30/2021 11:46   MR LUMBAR SPINE WO CONTRAST  Result Date: 05/30/2021 CLINICAL DATA:  Recurrent back pain. History of prior thoracolumbar compression fractures status post augmentation. EXAM: MRI THORACIC AND LUMBAR SPINE WITHOUT CONTRAST TECHNIQUE: Multiplanar and multiecho pulse sequences of the thoracic and lumbar spine were obtained without intravenous contrast. COMPARISON:  MRI thoracic and lumbar spine dated January 28, 2020. FINDINGS: MRI THORACIC SPINE FINDINGS Alignment: Unchanged mild dextrocurvature of the mid to lower thoracic spine. No listhesis. Vertebrae: No acute fracture, evidence of discitis, or bone lesion. Chronic T10 and T12 compression  deformity status post augmentation. Cord:  Normal signal and morphology. Paraspinal and other soft tissues: Negative. Disc levels: Unchanged minimal disc bulging at T11-T12 and scattered mild thoracic facet arthropathy. No spinal canal or neuroforaminal stenosis. MRI LUMBAR SPINE FINDINGS Segmentation:  Standard. Alignment:  Unchanged mild levoscoliosis.  No significant listhesis. Vertebrae: New acute L3 superior endplate compression fracture with minimal height loss, approximately 10%. Chronic mild L4 superior endplate compression fracture, new since March 2021. Unchanged chronic L1 and L2  compression deformities status post augmentation. Unchanged chronic mild L5 compression deformity. No evidence of discitis or suspicious bone lesion. Conus medullaris and cauda equina: Conus extends to the L1 level. Conus and cauda equina appear normal. Paraspinal and other soft tissues: Negative. Disc levels: T12-L1: No significant disc bulge or herniation. Unchanged minimal retrolisthesis of the L1 posterosuperior endplate. No stenosis. L1-L2:  Unchanged mild left-sided disc bulging.  No stenosis. L2-L3:  Unchanged mild left-sided disc bulging.  No stenosis. L3-L4: Unchanged mild disc bulging and bilateral facet arthropathy. No stenosis. L4-L5: Unchanged mild disc bulging. Unchanged mild bilateral facet arthropathy. No stenosis. L5-S1: Unchanged minimal disc bulging and small left foraminal disc osteophyte complex. Unchanged mild left facet arthropathy. No stenosis. IMPRESSION: 1. Acute L3 superior endplate compression fracture with minimal height loss. 2. Chronic mild L4 superior endplate compression fracture, new since March 2021. 3. Chronic T10, T12, L1, and L2 compression fractures status post augmentation. 4. Unchanged mild multilevel degenerative changes of the thoracolumbar spine without stenosis. Electronically Signed   By: Obie Dredge M.D.   On: 05/30/2021 11:46     Pamella Pert, MD, PhD [facility]  Between 7 am - 7 pm I am available, please contact me via Amion (for emergencies) or Securechat (non urgent messages)  Between 7 pm - 7 am I am not available, please contact night coverage MD/APP via Amion

## 2021-06-01 ENCOUNTER — Inpatient Hospital Stay (HOSPITAL_COMMUNITY): Payer: Medicare Other

## 2021-06-01 DIAGNOSIS — S32000A Wedge compression fracture of unspecified lumbar vertebra, initial encounter for closed fracture: Secondary | ICD-10-CM | POA: Diagnosis not present

## 2021-06-01 HISTORY — PX: IR KYPHO LUMBAR INC FX REDUCE BONE BX UNI/BIL CANNULATION INC/IMAGING: IMG5519

## 2021-06-01 LAB — CBC
HCT: 39.6 % (ref 36.0–46.0)
Hemoglobin: 12.9 g/dL (ref 12.0–15.0)
MCH: 28.4 pg (ref 26.0–34.0)
MCHC: 32.6 g/dL (ref 30.0–36.0)
MCV: 87 fL (ref 80.0–100.0)
Platelets: 233 10*3/uL (ref 150–400)
RBC: 4.55 MIL/uL (ref 3.87–5.11)
RDW: 14.5 % (ref 11.5–15.5)
WBC: 7 10*3/uL (ref 4.0–10.5)
nRBC: 0 % (ref 0.0–0.2)

## 2021-06-01 LAB — BASIC METABOLIC PANEL
Anion gap: 7 (ref 5–15)
BUN: 13 mg/dL (ref 8–23)
CO2: 23 mmol/L (ref 22–32)
Calcium: 8.8 mg/dL — ABNORMAL LOW (ref 8.9–10.3)
Chloride: 107 mmol/L (ref 98–111)
Creatinine, Ser: 0.64 mg/dL (ref 0.44–1.00)
GFR, Estimated: >60 mL/min (ref >60)
Glucose, Bld: 103 mg/dL — ABNORMAL HIGH (ref 70–99)
Potassium: 4.3 mmol/L (ref 3.5–5.1)
Sodium: 137 mmol/L (ref 135–145)

## 2021-06-01 LAB — PROTIME-INR
INR: 1 (ref 0.8–1.2)
Prothrombin Time: 13.3 seconds (ref 11.4–15.2)

## 2021-06-01 MED ORDER — FENTANYL CITRATE (PF) 100 MCG/2ML IJ SOLN
INTRAMUSCULAR | Status: AC | PRN
  Administered 2021-06-01 (×2): 12.5 ug via INTRAVENOUS
  Administered 2021-06-01: 25 ug via INTRAVENOUS

## 2021-06-01 MED ORDER — CEFAZOLIN SODIUM-DEXTROSE 2-4 GM/100ML-% IV SOLN
INTRAVENOUS | Status: AC | PRN
  Administered 2021-06-01: 2 g via INTRAVENOUS

## 2021-06-01 MED ORDER — TOBRAMYCIN SULFATE 1.2 G IJ SOLR
INTRAMUSCULAR | Status: AC
  Filled 2021-06-01: qty 1.2

## 2021-06-01 MED ORDER — MIDAZOLAM HCL 2 MG/2ML IJ SOLN
INTRAMUSCULAR | Status: AC | PRN
  Administered 2021-06-01: 0.5 mg via INTRAVENOUS
  Administered 2021-06-01: 1 mg via INTRAVENOUS

## 2021-06-01 MED ORDER — BUPIVACAINE HCL (PF) 0.5 % IJ SOLN
INTRAMUSCULAR | Status: AC | PRN
  Administered 2021-06-01: 20 mL

## 2021-06-01 MED ORDER — CEFAZOLIN SODIUM-DEXTROSE 2-4 GM/100ML-% IV SOLN
INTRAVENOUS | Status: AC
  Filled 2021-06-01: qty 100

## 2021-06-01 MED ORDER — SODIUM CHLORIDE 0.9 % IV SOLN: INTRAVENOUS | Status: AC

## 2021-06-01 MED ORDER — BUPIVACAINE HCL (PF) 0.5 % IJ SOLN
INTRAMUSCULAR | Status: AC
  Filled 2021-06-01: qty 30

## 2021-06-01 MED ORDER — FENTANYL CITRATE (PF) 100 MCG/2ML IJ SOLN
INTRAMUSCULAR | Status: AC
  Filled 2021-06-01: qty 2

## 2021-06-01 MED ORDER — MIDAZOLAM HCL 2 MG/2ML IJ SOLN
INTRAMUSCULAR | Status: AC
  Filled 2021-06-01: qty 2

## 2021-06-01 NOTE — Procedures (Signed)
S/P L3 ballon KP S.Eugenia Eldredge MD

## 2021-06-01 NOTE — Plan of Care (Signed)

## 2021-06-01 NOTE — Plan of Care (Signed)
  Problem: Activity: Goal: Risk for activity intolerance will decrease Outcome: Not Progressing   Problem: Pain Managment: Goal: General experience of comfort will improve Outcome: Progressing   

## 2021-06-01 NOTE — Progress Notes (Signed)
PROGRESS NOTE    Amy Weiss  ZOX:[id number] DOB: June 17, 1935 DOA: 05/30/2021 PCP: Wanda PlumpPaz, Jose E, MD    Brief Narrative:  86 year old female with history of DM2, HLD, dementia, prior compression fractures treated with kyphoplasty, hypothyroidism comes to the hospital with back pain.  She was lifting the cushion off a chair and all of a sudden felt sudden onset of back pain.  Was severe to the point that she was unable to ambulate.  Daughter brought her to the hospital, underwent an MRI which showed new L3 compression fracture.  IR consulted, she was approved for kyphoplasty and now awaiting insurance approval      Consultants:  IR  Procedures:   Antimicrobials:      Subjective: Patient has no complaints this AM.  She just woke up.  Denies chest pain or shortness of breath.  Denies back pain as she tells me she is just woke up.  Daughter at bedside  Objective: Vitals:   05/31/21 0500 05/31/21 0735 05/31/21 1410 05/31/21 2127  BP: (!) 115/58 (!) 150/55 (!) 138/49 123/84  Pulse: (!) 50 (!) 105 (!) 40 (!) 50  Resp: 15 18 17 18   Temp: 97.7 F (36.5 C) 98.5 F (36.9 C) 98.4 F (36.9 C) 98.1 F (36.7 C)  TempSrc: Oral Oral Oral Oral  SpO2: 95% 96% 97% 96%  Weight:      Height:       No intake or output data in the 24 hours ending 06/01/21 0757 Filed Weights   05/30/21 0746  Weight: 68 kg    Examination:  General exam: Appears calm and comfortable, lying flat in bed Respiratory system: Clear to auscultation. Respiratory effort normal. Cardiovascular system: S1 & S2 heard, RRR. No JVD, murmurs, rubs, gallops or clicks.  Gastrointestinal system: Abdomen is nondistended, soft and nontender. Normal bowel sounds heard. Central nervous system: Alert and awake, grossly intact  Extremities: No edema, left lateral wall foot with dried excoriation Skin: Warm dry Psychiatry:  Mood & affect appropriate.     Data Reviewed: I have personally reviewed following labs and imaging  studies  CBC: Recent Labs  Lab 05/30/21 1439 05/31/21 0201 06/01/21 0322  WBC 7.1 7.0 7.0  NEUTROABS 4.1  --   --   HGB 13.4 12.2 12.9  HCT 41.2 37.6 39.6  MCV 89.2 87.6 87.0  PLT 244 228 233   Basic Metabolic Panel: Recent Labs  Lab 05/30/21 1439 05/31/21 0201 06/01/21 0322  NA 137 137 137  K 4.0 4.3 4.3  CL 106 105 107  CO2 27 24 23   GLUCOSE 110* 127* 103*  BUN 16 22 13   CREATININE 0.76 0.72 0.64  CALCIUM 8.7* 8.5* 8.8*   GFR: Estimated Creatinine Clearance: 51.8 mL/min (by C-G formula based on SCr of 0.64 mg/dL). Liver Function Tests: No results for input(s): AST, ALT, ALKPHOS, BILITOT, PROT, ALBUMIN in the last 168 hours. No results for input(s): LIPASE, AMYLASE in the last 168 hours. No results for input(s): AMMONIA in the last 168 hours. Coagulation Profile: Recent Labs  Lab 06/01/21 0322  INR 1.0   Cardiac Enzymes: No results for input(s): CKTOTAL, CKMB, CKMBINDEX, TROPONINI in the last 168 hours. BNP (last 3 results) No results for input(s): PROBNP in the last 8760 hours. HbA1C: No results for input(s): HGBA1C in the last 72 hours. CBG: No results for input(s): GLUCAP in the last 168 hours. Lipid Profile: No results for input(s): CHOL, HDL, LDLCALC, TRIG, CHOLHDL, LDLDIRECT in the last 72 hours. Thyroid  Function Tests: No results for input(s): TSH, T4TOTAL, FREET4, T3FREE, THYROIDAB in the last 72 hours. Anemia Panel: No results for input(s): VITAMINB12, FOLATE, FERRITIN, TIBC, IRON, RETICCTPCT in the last 72 hours. Sepsis Labs: No results for input(s): PROCALCITON, LATICACIDVEN in the last 168 hours.  Recent Results (from the past 240 hour(s))  Resp Panel by RT-PCR (Flu A&B, Covid) Nasopharyngeal Swab     Status: None   Collection Time: 05/30/21  2:55 PM   Specimen: Nasopharyngeal Swab; Nasopharyngeal(NP) swabs in vial transport medium  Result Value Ref Range Status   SARS Coronavirus 2 by RT PCR NEGATIVE NEGATIVE Final    Comment:  (NOTE) SARS-CoV-2 target nucleic acids are NOT DETECTED.  The SARS-CoV-2 RNA is generally detectable in upper respiratory specimens during the acute phase of infection. The lowest concentration of SARS-CoV-2 viral copies this assay can detect is 138 copies/mL. A negative result does not preclude SARS-Cov-2 infection and should not be used as the sole basis for treatment or other patient management decisions. A negative result may occur with  improper specimen collection/handling, submission of specimen other than nasopharyngeal swab, presence of viral mutation(s) within the areas targeted by this assay, and inadequate number of viral copies(<138 copies/mL). A negative result must be combined with clinical observations, patient history, and epidemiological information. The expected result is Negative.  Fact Sheet for Patients:  BloggerCourse.com  Fact Sheet for Healthcare Providers:  SeriousBroker.it  This test is no t yet approved or cleared by the [place] FDA and  has been authorized for detection and/or diagnosis of SARS-CoV-2 by FDA under an Emergency Use Authorization (EUA). This EUA will remain  in effect (meaning this test can be used) for the duration of the COVID-19 declaration under Section 564(b)(1) of the Act, 21 U.S.C.section 360bbb-3(b)(1), unless the authorization is terminated  or revoked sooner.       Influenza A by PCR NEGATIVE NEGATIVE Final   Influenza B by PCR NEGATIVE NEGATIVE Final    Comment: (NOTE) The Xpert Xpress SARS-CoV-2/FLU/RSV plus assay is intended as an aid in the diagnosis of influenza from Nasopharyngeal swab specimens and should not be used as a sole basis for treatment. Nasal washings and aspirates are unacceptable for Xpert Xpress SARS-CoV-2/FLU/RSV testing.  Fact Sheet for Patients: BloggerCourse.com  Fact Sheet for Healthcare  Providers: SeriousBroker.it  This test is not yet approved or cleared by the [place] FDA and has been authorized for detection and/or diagnosis of SARS-CoV-2 by FDA under an Emergency Use Authorization (EUA). This EUA will remain in effect (meaning this test can be used) for the duration of the COVID-19 declaration under Section 564(b)(1) of the Act, 21 U.S.C. section 360bbb-3(b)(1), unless the authorization is terminated or revoked.  Performed at [facility], [address] [place], [place] [address]          Radiology Studies: MR THORACIC SPINE WO CONTRAST  Result Date: 05/30/2021 CLINICAL DATA:  Recurrent back pain. History of prior thoracolumbar compression fractures status post augmentation. EXAM: MRI THORACIC AND LUMBAR SPINE WITHOUT CONTRAST TECHNIQUE: Multiplanar and multiecho pulse sequences of the thoracic and lumbar spine were obtained without intravenous contrast. COMPARISON:  MRI thoracic and lumbar spine dated January 28, 2020. FINDINGS: MRI THORACIC SPINE FINDINGS Alignment: Unchanged mild dextrocurvature of the mid to lower thoracic spine. No listhesis. Vertebrae: No acute fracture, evidence of discitis, or bone lesion. Chronic T10 and T12 compression deformity status post augmentation. Cord:  Normal signal and morphology. Paraspinal and other soft tissues: Negative.  Disc levels: Unchanged minimal disc bulging at T11-T12 and scattered mild thoracic facet arthropathy. No spinal canal or neuroforaminal stenosis. MRI LUMBAR SPINE FINDINGS Segmentation:  Standard. Alignment:  Unchanged mild levoscoliosis.  No significant listhesis. Vertebrae: New acute L3 superior endplate compression fracture with minimal height loss, approximately 10%. Chronic mild L4 superior endplate compression fracture, new since March 2021. Unchanged chronic L1 and L2 compression deformities status post augmentation. Unchanged chronic mild L5 compression deformity. No  evidence of discitis or suspicious bone lesion. Conus medullaris and cauda equina: Conus extends to the L1 level. Conus and cauda equina appear normal. Paraspinal and other soft tissues: Negative. Disc levels: T12-L1: No significant disc bulge or herniation. Unchanged minimal retrolisthesis of the L1 posterosuperior endplate. No stenosis. L1-L2:  Unchanged mild left-sided disc bulging.  No stenosis. L2-L3:  Unchanged mild left-sided disc bulging.  No stenosis. L3-L4: Unchanged mild disc bulging and bilateral facet arthropathy. No stenosis. L4-L5: Unchanged mild disc bulging. Unchanged mild bilateral facet arthropathy. No stenosis. L5-S1: Unchanged minimal disc bulging and small left foraminal disc osteophyte complex. Unchanged mild left facet arthropathy. No stenosis. IMPRESSION: 1. Acute L3 superior endplate compression fracture with minimal height loss. 2. Chronic mild L4 superior endplate compression fracture, new since March 2021. 3. Chronic T10, T12, L1, and L2 compression fractures status post augmentation. 4. Unchanged mild multilevel degenerative changes of the thoracolumbar spine without stenosis. Electronically Signed   By: Obie Dredge M.D.   On: 05/30/2021 11:46   MR LUMBAR SPINE WO CONTRAST  Result Date: 05/30/2021 CLINICAL DATA:  Recurrent back pain. History of prior thoracolumbar compression fractures status post augmentation. EXAM: MRI THORACIC AND LUMBAR SPINE WITHOUT CONTRAST TECHNIQUE: Multiplanar and multiecho pulse sequences of the thoracic and lumbar spine were obtained without intravenous contrast. COMPARISON:  MRI thoracic and lumbar spine dated January 28, 2020. FINDINGS: MRI THORACIC SPINE FINDINGS Alignment: Unchanged mild dextrocurvature of the mid to lower thoracic spine. No listhesis. Vertebrae: No acute fracture, evidence of discitis, or bone lesion. Chronic T10 and T12 compression deformity status post augmentation. Cord:  Normal signal and morphology. Paraspinal and other soft  tissues: Negative. Disc levels: Unchanged minimal disc bulging at T11-T12 and scattered mild thoracic facet arthropathy. No spinal canal or neuroforaminal stenosis. MRI LUMBAR SPINE FINDINGS Segmentation:  Standard. Alignment:  Unchanged mild levoscoliosis.  No significant listhesis. Vertebrae: New acute L3 superior endplate compression fracture with minimal height loss, approximately 10%. Chronic mild L4 superior endplate compression fracture, new since March 2021. Unchanged chronic L1 and L2 compression deformities status post augmentation. Unchanged chronic mild L5 compression deformity. No evidence of discitis or suspicious bone lesion. Conus medullaris and cauda equina: Conus extends to the L1 level. Conus and cauda equina appear normal. Paraspinal and other soft tissues: Negative. Disc levels: T12-L1: No significant disc bulge or herniation. Unchanged minimal retrolisthesis of the L1 posterosuperior endplate. No stenosis. L1-L2:  Unchanged mild left-sided disc bulging.  No stenosis. L2-L3:  Unchanged mild left-sided disc bulging.  No stenosis. L3-L4: Unchanged mild disc bulging and bilateral facet arthropathy. No stenosis. L4-L5: Unchanged mild disc bulging. Unchanged mild bilateral facet arthropathy. No stenosis. L5-S1: Unchanged minimal disc bulging and small left foraminal disc osteophyte complex. Unchanged mild left facet arthropathy. No stenosis. IMPRESSION: 1. Acute L3 superior endplate compression fracture with minimal height loss. 2. Chronic mild L4 superior endplate compression fracture, new since March 2021. 3. Chronic T10, T12, L1, and L2 compression fractures status post augmentation. 4. Unchanged mild multilevel degenerative changes of the thoracolumbar spine without  stenosis. Electronically Signed   By: Obie Dredge M.D.   On: 05/30/2021 11:46        Scheduled Meds:  docusate sodium  100 mg Oral BID   enoxaparin (LOVENOX) injection  40 mg Subcutaneous Q24H   escitalopram  2.5 mg Oral  Daily   levothyroxine  88 mcg Oral QAC breakfast   lidocaine  1 patch Transdermal Q24H   Continuous Infusions:   ceFAZolin (ANCEF) IV      Assessment & Plan:   Principal Problem:   Compression fracture of lumbar vertebra, closed, initial encounter (HCC) Active Problems:   Hyperlipidemia   Bradycardia   Senile dementia uncomp, without behavioral disturbance (HCC)   Hypothyroid   Severe back pain, L3 compression fracture-she has had kyphoplasty's in the past with excellent success.  IR consulted, it appears that Dr Corliss Skains approved kyphoplasty pending insurance authorization 7/13 pain control with lidocaine patch, hydrocodone/acetaminophen Apparently insurance was approved, IR procedure today     Active Problems Dementia-continue Lexapro, at risk for delirium and daughter spends the night here.   7/13 continue standard delirium management with frequent reorientation, as much sunlight as possible during daytime, encourage early mobility    Chronic A. fib with bradycardia-chronic bradycardia, stable, no symptoms.   7/13 holding anticoagulation for procedure today     Type 2 diabetes mellitus-diet controlled, prior A1c was 5.7  Monitor periodically with morning   Hypothyroidism-continue Synthroid     DVT prophylaxis: Lovenox Code Status: Full Family Communication: Daughter at bedside Disposition Plan:  Status is: Inpatient  Remains inpatient appropriate because:Inpatient level of care appropriate due to severity of illness  Dispo: The patient is from: Home              Anticipated d/c is to: Home              Patient currently is not medically stable to d/c.   Difficult to place patient No            LOS: 2 days   Time spent: 35 minutes with more than 50% on COC    Lynn Ito, MD [facility] Pager [phone]  If 7PM-7AM, please contact night-coverage 06/01/2021, 7:57 AM

## 2021-06-02 DIAGNOSIS — S32000A Wedge compression fracture of unspecified lumbar vertebra, initial encounter for closed fracture: Secondary | ICD-10-CM | POA: Diagnosis not present

## 2021-06-02 MED ORDER — AMLODIPINE BESYLATE 2.5 MG PO TABS
2.5000 mg | ORAL_TABLET | Freq: Every day | ORAL | Status: DC
  Administered 2021-06-02 – 2021-06-03 (×2): 2.5 mg via ORAL
  Filled 2021-06-02 (×2): qty 1

## 2021-06-02 NOTE — Plan of Care (Signed)
  Problem: Education: Goal: Knowledge of General Education information will improve Description: Including pain rating scale, medication(s)/side effects and non-pharmacologic comfort measures Outcome: Progressing   Problem: Health Behavior/Discharge Planning: Goal: Ability to manage health-related needs will improve Outcome: Progressing   Problem: Clinical Measurements: Goal: Will remain free from infection Outcome: Progressing   

## 2021-06-02 NOTE — Evaluation (Signed)
Physical Therapy Evaluation Patient Details Name: Amy Weiss MRN: [id number] DOB: 03-Feb-1935 Today's Date: 06/02/2021   History of Present Illness  Pt is 86 yo female who presented with severe back pain and found to have L3 compression fx.  She is s/p L 3 kyphoplasty on 06/01/21.  She has hx of DM2, HLD, dementia, and prior comp fx with kyphoplasties  Clinical Impression  Pt admitted with above diagnosis. At baseline, pt lives alone and is fairly independent.  She does have hx of dementia - daughter plans to stay with her for recovery. Today, pt ambulated 77' with RW and min guard.  Min cues for back precautions with transfers.  Daughter present and educated on precautions - reports pt has had multiple kyphoplasties and they are familiar with process, Pt currently with functional limitations due to the deficits listed below (see PT Problem List). Pt will benefit from skilled PT to increase their independence and safety with mobility to allow discharge to the venue listed below.       Follow Up Recommendations Home health PT    Equipment Recommendations  None recommended by PT    Recommendations for Other Services       Precautions / Restrictions Precautions Precautions: Back;Fall Precaution Booklet Issued: Yes (comment) Precaution Comments: Educated on back precautions; no need for brace Restrictions Weight Bearing Restrictions: No      Mobility  Bed Mobility Overal bed mobility: Needs Assistance Bed Mobility: Rolling;Sidelying to Sit;Sit to Sidelying Rolling: Min guard Sidelying to sit: Min guard     Sit to sidelying: Min guard General bed mobility comments: cues for log roll    Transfers Overall transfer level: Needs assistance Equipment used: Rolling walker (2 wheeled) Transfers: Sit to/from Stand Sit to Stand: Min assist         General transfer comment: Cues for hand placement  Ambulation/Gait Ambulation/Gait assistance: Min guard Gait Distance (Feet):  70 Feet Assistive device: Rolling walker (2 wheeled) Gait Pattern/deviations: Step-through pattern;Decreased stride length Gait velocity: decreased   General Gait Details: min cues for RW proximity; demonstrated safe turns in tight areas; min guard for safety  Stairs            Wheelchair Mobility    Modified Rankin (Stroke Patients Only)       Balance Overall balance assessment: Needs assistance Sitting-balance support: No upper extremity supported Sitting balance-Leahy Scale: Good     Standing balance support: No upper extremity supported;Bilateral upper extremity supported Standing balance-Leahy Scale: Fair Standing balance comment: RW to ambulate; could static stand without AD                             Pertinent Vitals/Pain Pain Assessment: No/denies pain    Home Living Family/patient expects to be discharged to:: Private residence Living Arrangements: Alone Available Help at Discharge: Family;Available 24 hours/day (daughter can stay initially) Type of Home: House Home Access: Stairs to enter Entrance Stairs-Rails: None Entrance Stairs-Number of Steps: 1 Home Layout: One level;Other (Comment) (1 level but does have 2 steps down into living room with rails) Home Equipment: Bedside commode;Shower seat;Hospital bed;Grab bars - toilet;Grab bars - tub/shower;Walker - 2 wheels      Prior Function Level of Independence: Needs assistance   Gait / Transfers Assistance Needed: Pt ambulates in home without AD, does walk up/down road with RW  ADL's / Homemaking Assistance Needed: Pt independent with ADLs and light IADLs ; did not drive  Hand Dominance        Extremity/Trunk Assessment   Upper Extremity Assessment Upper Extremity Assessment: Overall WFL for tasks assessed    Lower Extremity Assessment Lower Extremity Assessment: Overall WFL for tasks assessed (ROM WFL; MMT at least 3/5 but not further tested due to recent kyphoplasty)     Cervical / Trunk Assessment Cervical / Trunk Assessment: Normal  Communication   Communication: No difficulties  Cognition Arousal/Alertness: Awake/alert Behavior During Therapy: Agitated (but cooperated with therapy) Overall Cognitive Status: History of cognitive impairments - at baseline                                        General Comments General comments (skin integrity, edema, etc.): Daughter present and encouraging pt    Exercises     Assessment/Plan    PT Assessment Patient needs continued PT services  PT Problem List Decreased strength;Decreased mobility;Decreased safety awareness;Decreased knowledge of precautions;Decreased balance;Decreased knowledge of use of DME       PT Treatment Interventions DME instruction;Therapeutic activities;Gait training;Therapeutic exercise;Patient/family education;Stair training;Balance training;Functional mobility training;Modalities    PT Goals (Current goals can be found in the Care Plan section)  Acute Rehab PT Goals Patient Stated Goal: return home PT Goal Formulation: With patient/family Time For Goal Achievement: 06/16/21 Potential to Achieve Goals: Good    Frequency Min 4X/week   Barriers to discharge        Co-evaluation               AM-PAC PT "6 Clicks" Mobility  Outcome Measure Help needed turning from your back to your side while in a flat bed without using bedrails?: A Little Help needed moving from lying on your back to sitting on the side of a flat bed without using bedrails?: A Little Help needed moving to and from a bed to a chair (including a wheelchair)?: A Little Help needed standing up from a chair using your arms (e.g., wheelchair or bedside chair)?: A Little Help needed to walk in hospital room?: A Little Help needed climbing 3-5 steps with a railing? : A Little 6 Click Score: 18    End of Session Equipment Utilized During Treatment: Gait belt Activity Tolerance: Patient  tolerated treatment well Patient left: in bed;with call bell/phone within reach;with bed alarm set;with family/visitor present Nurse Communication: Mobility status PT Visit Diagnosis: Other abnormalities of gait and mobility (R26.89)    Time: 8676-1950 PT Time Calculation (min) (ACUTE ONLY): 20 min   Charges:   PT Evaluation $PT Eval Low Complexity: 1 Low        Phelan Schadt, PT Acute Rehab Services Pager [phone] [facility] [phone]   Rayetta Humphrey 06/02/2021, 11:03 AM

## 2021-06-02 NOTE — Progress Notes (Addendum)
Occupational Therapy Evaluation Patient Details Name: Amy Weiss MRN: [id number] DOB: Jun 06, 1935 Today's Date: 06/02/2021    History of Present Illness Pt is 86 yo female who presented with severe back pain and found to have L3 compression fx.  She is s/p L 3 kyphoplasty on 06/01/21.  She has hx of DM2, HLD, dementia, and prior comp fx with kyphoplasties   Clinical Impression   Pt presents with above diagnosis. PTA pt PLOF living at home alone with caregiver support per daughter at bedside. Pt lives in one level home with AE of shower chair, raised toilet seat and BSC. Daughter reports pt was really active and walked daily. Pt and daughter educated on back precautions and AE with hand out and demonstration. Pt unable to tolerated full upright standing to pain limiting engagement. Pt is currently limited with safe ADL engagement due to pain, decreased safety awareness, weakness and limited cognition. Pt will benefit from continued skilled OT at acute level and HHOT to maximize independence in ADLs with caregiver/patient education, compensatory strategies, AE education, and safety awareness. DC recommendation to [facility] with support.     Follow Up Recommendations  Home health OT;Supervision/Assistance - 24 hour    Equipment Recommendations  Tub/shower bench    Recommendations for Other Services       Precautions / Restrictions Precautions Precautions: Back;Fall Precaution Booklet Issued: Yes (comment) (Pt able to recall 2/3 back precautions, reminders for no twisting.) Precaution Comments: Educated on back precautions; no need for brace Restrictions Weight Bearing Restrictions: No      Mobility Bed Mobility Overal bed mobility: Needs Assistance Bed Mobility: Rolling;Sidelying to Sit;Sit to Sidelying Rolling: Min guard Sidelying to sit: Min guard     Sit to sidelying: Min guard General bed mobility comments: cues for log roll, pt requires heavy encouragement to transition EOB.     Transfers Overall transfer level: Needs assistance Equipment used: Rolling walker (2 wheeled) Transfers: Sit to/from Stand Sit to Stand: Min assist         General transfer comment: Cues for hand placement, pt unable to stand fully upright due to pain. 3 attempts with min A assist to control returning to sitting.    Balance Overall balance assessment: Needs assistance Sitting-balance support: No upper extremity supported Sitting balance-Leahy Scale: Good     Standing balance support: No upper extremity supported;Bilateral upper extremity supported Standing balance-Leahy Scale: Fair Standing balance comment: RW to ambulate; could static stand without AD                           ADL either performed or assessed with clinical judgement   ADL Overall ADL's : Needs assistance/impaired     Grooming: Wash/dry hands;Wash/dry face;Oral care;Min guard Grooming Details (indicate cue type and reason): educated of compensatory strategy to adhere to back precaution, pt/caregiver education.         Upper Body Dressing : Min guard   Lower Body Dressing: Min guard Lower Body Dressing Details (indicate cue type and reason): Pt able to present BLE into figure 4 position to don/doff sock. Unable to address stability in standing with clothing due to c/o pain when attempting to stand during session. Toilet Transfer: Minimal assistance;Cueing for safety;Cueing for sequencing;RW Toilet Transfer Details (indicate cue type and reason): simulated toilet transfer, not successful due to pain. Once powering up to stand pt is limited to fully stand up right this session. Observed to be agitated to complete transfer.  Functional mobility during ADLs:  (Please refer to PT note due to increased pain this session.) General ADL Comments: caregiver/pt educated of back precautions with handout provided, demonstrated and educated on oral care, dressing, and toilet hygiene with use of AE.  Daughter reports pt has difficulty steadying reacher due to hx of bell palsy.     Vision Baseline Vision/History: Wears glasses Patient Visual Report:  (daughter reports cataracts)       Perception     Praxis      Pertinent Vitals/Pain Pain Assessment: 0-10 Pain Score: 4  Pain Location: back and legs Pain Descriptors / Indicators: Aching;Discomfort Pain Intervention(s): Limited activity within patient's tolerance;Monitored during session;Repositioned     Hand Dominance Left   Extremity/Trunk Assessment Upper Extremity Assessment Upper Extremity Assessment: Generalized weakness (unable to fully assess due to pain with resistance.)   Lower Extremity Assessment Lower Extremity Assessment: Defer to PT evaluation   Cervical / Trunk Assessment Cervical / Trunk Assessment: Normal   Communication Communication Communication: No difficulties   Cognition Arousal/Alertness: Awake/alert Behavior During Therapy: Agitated (but cooperated with therapy) Overall Cognitive Status: History of cognitive impairments - at baseline                                     General Comments  daughter present at bedside, assisted with PLOF due to hx of cognitive dysfunction.    Exercises     Shoulder Instructions      Home Living Family/patient expects to be discharged to:: Private residence Living Arrangements: Alone Available Help at Discharge: Family;Available 24 hours/day (daughter can stay initially) Type of Home: House Home Access: Stairs to enter [employer] of Steps: 1 Entrance Stairs-Rails: None Home Layout: One level;Other (Comment) (1 level but does have 2 steps down into living room with rails)     Bathroom Shower/Tub: Tub/shower unit   Bathroom Toilet: Handicapped height     Home Equipment: Bedside commode;Shower seat;Hospital bed;Grab bars - toilet;Grab bars - tub/shower;Walker - 2 wheels;Hand held shower head          Prior  Functioning/Environment Level of Independence: Needs assistance  Gait / Transfers Assistance Needed: Pt ambulates in home without AD, does walk up/down road with RW ADL's / Homemaking Assistance Needed: Pt independent with ADLs and light IADLs ; did not drive   Comments: Daughter at bedside reports, pt has caregiver that come in to assist for home management and medication medication for 4 hours daily.        OT Problem List: Decreased strength;Decreased activity tolerance;Decreased safety awareness;Decreased cognition;Decreased knowledge of use of DME or AE;Decreased knowledge of precautions;Pain      OT Treatment/Interventions: Self-care/ADL training;Therapeutic exercise;DME and/or AE instruction;Therapeutic activities;Cognitive remediation/compensation;Patient/family education;Balance training    OT Goals(Current goals can be found in the care plan section) Acute Rehab OT Goals Patient Stated Goal: return home OT Goal Formulation: With patient/family Time For Goal Achievement: 06/16/21 Potential to Achieve Goals: Fair  OT Frequency: Min 2X/week   Barriers to D/C: Decreased caregiver support  Daughter reports getting increased hours with caregiver support.       Co-evaluation              AM-PAC OT "6 Clicks" Daily Activity     Outcome Measure Help from another person eating meals?: None Help from another person taking care of personal grooming?: A Little Help from another person toileting, which includes using toliet, bedpan, or  urinal?: A Little Help from another person bathing (including washing, rinsing, drying)?: A Little Help from another person to put on and taking off regular upper body clothing?: A Little Help from another person to put on and taking off regular lower body clothing?: A Little 6 Click Score: 19   End of Session Equipment Utilized During Treatment: Rolling walker;Gait belt Nurse Communication: Patient requests pain meds  Activity Tolerance:  Patient limited by pain Patient left: in bed;with call bell/phone within reach;with family/visitor present  OT Visit Diagnosis: Unsteadiness on feet (R26.81);Muscle weakness (generalized) (M62.81);Pain                Time: 1259-1335 OT Time Calculation (min): 36 min Charges:  OT General Charges $OT Visit: 1 Visit OT Evaluation $OT Eval Moderate Complexity: 1 Mod OT Treatments $Self Care/Home Management : 8-22 mins  Marquette Old, MSOT, OTR/L  [facility]  [phone]   Zigmund Daniel 06/02/2021, 2:13 PM

## 2021-06-02 NOTE — Progress Notes (Signed)
PROGRESS NOTE    Amy Weiss  ZOX:[id number] DOB: 1935-03-29 DOA: 05/30/2021 PCP: Wanda PlumpPaz, Jose E, MD    Brief Narrative:  [age] year old female with history of DM2, HLD, dementia, prior compression fractures treated with kyphoplasty, hypothyroidism comes to the hospital with back pain.  She was lifting the cushion off a chair and all of a sudden felt sudden onset of back pain.  Was severe to the point that she was unable to ambulate.  Daughter brought her to the hospital, underwent an MRI which showed new L3 compression fracture.  IR consulted, she was approved for kyphoplasty and now awaiting insurance approval    7/14-daughter reports pain is well controlled.  She is not eating much.  Has not gotten up from bed yet.  Daughter also concerned patient is weak. Status post L3 balloon kyphoplasty by IR on 7/13  Consultants:  IR  Procedures:   Antimicrobials:      Subjective: Patient is unsure if she is able to get up.  She feels weak.  Denies pain.  Objective: Vitals:   06/01/21 1522 06/01/21 1600 06/01/21 2052 06/02/21 0655  BP: (!) 145/48 (!) 140/58 (!) 188/54 (!) 158/67  Pulse: (!) 41 (!) 40 (!) 41 (!) 53  Resp: 16 16 18 17   Temp:   98.1 F (36.7 C) 98.3 F (36.8 C)  TempSrc:   Oral Oral  SpO2: 97% 96% 98% 96%  Weight:      Height:        Intake/Output Summary (Last 24 hours) at 06/02/2021 1002 Last data filed at 06/01/2021 1900 Gross per 24 hour  Intake 220 ml  Output --  Net 220 ml   Filed Weights   05/30/21 0746  Weight: 68 kg    Examination: Calm, pleasant, NAD CTA no wheeze Regular S1-S2 no gallops Soft benign positive bowel sounds No edema Mood and affect appropriate in current setting    Data Reviewed: I have personally reviewed following labs and imaging studies  CBC: Recent Labs  Lab 05/30/21 1439 05/31/21 0201 06/01/21 0322  WBC 7.1 7.0 7.0  NEUTROABS 4.1  --   --   HGB 13.4 12.2 12.9  HCT 41.2 37.6 39.6  MCV 89.2 87.6 87.0  PLT 244  228 233   Basic Metabolic Panel: Recent Labs  Lab 05/30/21 1439 05/31/21 0201 06/01/21 0322  NA 137 137 137  K 4.0 4.3 4.3  CL 106 105 107  CO2 27 24 23   GLUCOSE 110* 127* 103*  BUN 16 22 13   CREATININE 0.76 0.72 0.64  CALCIUM 8.7* 8.5* 8.8*   GFR: Estimated Creatinine Clearance: 51.8 mL/min (by C-G formula based on SCr of 0.64 mg/dL). Liver Function Tests: No results for input(s): AST, ALT, ALKPHOS, BILITOT, PROT, ALBUMIN in the last 168 hours. No results for input(s): LIPASE, AMYLASE in the last 168 hours. No results for input(s): AMMONIA in the last 168 hours. Coagulation Profile: Recent Labs  Lab 06/01/21 0322  INR 1.0   Cardiac Enzymes: No results for input(s): CKTOTAL, CKMB, CKMBINDEX, TROPONINI in the last 168 hours. BNP (last 3 results) No results for input(s): PROBNP in the last 8760 hours. HbA1C: No results for input(s): HGBA1C in the last 72 hours. CBG: No results for input(s): GLUCAP in the last 168 hours. Lipid Profile: No results for input(s): CHOL, HDL, LDLCALC, TRIG, CHOLHDL, LDLDIRECT in the last 72 hours. Thyroid Function Tests: No results for input(s): TSH, T4TOTAL, FREET4, T3FREE, THYROIDAB in the last 72 hours. Anemia Panel: No results  for input(s): VITAMINB12, FOLATE, FERRITIN, TIBC, IRON, RETICCTPCT in the last 72 hours. Sepsis Labs: No results for input(s): PROCALCITON, LATICACIDVEN in the last 168 hours.  Recent Results (from the past 240 hour(s))  Resp Panel by RT-PCR (Flu A&B, Covid) Nasopharyngeal Swab     Status: None   Collection Time: 05/30/21  2:55 PM   Specimen: Nasopharyngeal Swab; Nasopharyngeal(NP) swabs in vial transport medium  Result Value Ref Range Status   SARS Coronavirus 2 by RT PCR NEGATIVE NEGATIVE Final    Comment: (NOTE) SARS-CoV-2 target nucleic acids are NOT DETECTED.  The SARS-CoV-2 RNA is generally detectable in upper respiratory specimens during the acute phase of infection. The lowest concentration of  SARS-CoV-2 viral copies this assay can detect is 138 copies/mL. A negative result does not preclude SARS-Cov-2 infection and should not be used as the sole basis for treatment or other patient management decisions. A negative result may occur with  improper specimen collection/handling, submission of specimen other than nasopharyngeal swab, presence of viral mutation(s) within the areas targeted by this assay, and inadequate number of viral copies(<138 copies/mL). A negative result must be combined with clinical observations, patient history, and epidemiological information. The expected result is Negative.  Fact Sheet for Patients:  BloggerCourse.com  Fact Sheet for Healthcare Providers:  SeriousBroker.it  This test is no t yet approved or cleared by the [place] FDA and  has been authorized for detection and/or diagnosis of SARS-CoV-2 by FDA under an Emergency Use Authorization (EUA). This EUA will remain  in effect (meaning this test can be used) for the duration of the COVID-19 declaration under Section 564(b)(1) of the Act, 21 U.S.C.section 360bbb-3(b)(1), unless the authorization is terminated  or revoked sooner.       Influenza A by PCR NEGATIVE NEGATIVE Final   Influenza B by PCR NEGATIVE NEGATIVE Final    Comment: (NOTE) The Xpert Xpress SARS-CoV-2/FLU/RSV plus assay is intended as an aid in the diagnosis of influenza from Nasopharyngeal swab specimens and should not be used as a sole basis for treatment. Nasal washings and aspirates are unacceptable for Xpert Xpress SARS-CoV-2/FLU/RSV testing.  Fact Sheet for Patients: BloggerCourse.com  Fact Sheet for Healthcare Providers: SeriousBroker.it  This test is not yet approved or cleared by the [place] FDA and has been authorized for detection and/or diagnosis of SARS-CoV-2 by FDA under an Emergency Use  Authorization (EUA). This EUA will remain in effect (meaning this test can be used) for the duration of the COVID-19 declaration under Section 564(b)(1) of the Act, 21 U.S.C. section 360bbb-3(b)(1), unless the authorization is terminated or revoked.  Performed at [facility], [address], [place], [place] [address]          Radiology Studies: IR KYPHO EA ADDL LEVEL THORACIC OR LUMBAR  Result Date: 06/02/2021 INDICATION: Severe low back pain secondary to compression fracture at L3. EXAM: BALLOON KYPHOPLASTY AT L3 COMPARISON:  MRI of the lumbosacral spine of May 30, 2021. MEDICATIONS: As antibiotic prophylaxis, Ancef 2 g IV was ordered pre-procedure and administered intravenously within 1 hour of incision. ANESTHESIA/SEDATION: Moderate (conscious) sedation was employed during this procedure. A total of Versed 1.5 mg and Fentanyl 50 mcg was administered intravenously. Moderate Sedation Time: 29 minutes. The patient's level of consciousness and vital signs were monitored continuously by radiology nursing throughout the procedure under my direct supervision. FLUOROSCOPY TIME:  Fluoroscopy Time: 11 minutes 42 seconds (776 mGy) COMPLICATIONS: None immediate. PROCEDURE: Following a full explanation of the procedure along  with the potential associated complications, an informed witnessed consent was obtained. The patient was placed prone on the fluoroscopic table. The skin overlying the lumbar region was then prepped and draped in the usual sterile fashion. The left pedicle at L3 was then infiltrated with 0.25% bupivacaine followed by the advancement of an 11-gauge Jamshidi needle through the left pedicle into the posterior one-third at L3. This was then exchanged for a Kyphon advanced osteo introducer system comprised of a working cannula and a Kyphon osteo drill. This combination was then advanced over a Kyphon osteo bone pin until the tip of the Kyphon osteo drill was in the posterior third at  L3. At this time, the bone pin was removed. In a medial trajectory, the combination was advanced until the tip of the working cannula was inside the posterior one-third at L3. The osteo drill was removed. Through the working cannula, a Kyphon inflatable bone tamp 20 x 3 was advanced and positioned with the distal marker 5 mm from the anterior aspect of L3. Crossing of the midline was seen on the AP projection. At this time, the balloon was expanded using contrast via a Kyphon inflation syringe device via microtubing. Inflations were continued until there was apposition with the superior and the inferior endplates. At this time, methylmethacrylate mixture was reconstituted with Tobramycin in the Kyphon bone mixing device system. This was then loaded onto the Kyphon bone fillers. The balloon was deflated and removed followed by the instillation of 5 bone filler equivalents of methylmethacrylate mixture at L3 with excellent filling in the AP and lateral projections. No extravasation was noted in the disk spaces or posteriorly into the spinal canal. No epidural venous contamination was seen. The working cannula and the bone filler were then retrieved and removed. Hemostasis was achieved at the skin entry site. IMPRESSION: 1. Status post vertebral body augmentation using balloon kyphoplasty at L3 as described without event. Electronically Signed   By: Julieanne Cotton M.D.   On: 06/01/2021 16:19        Scheduled Meds:  docusate sodium  100 mg Oral BID   enoxaparin (LOVENOX) injection  40 mg Subcutaneous Q24H   escitalopram  2.5 mg Oral Daily   levothyroxine  88 mcg Oral QAC breakfast   lidocaine  1 patch Transdermal Q24H   Continuous Infusions:    Assessment & Plan:   Principal Problem:   Compression fracture of lumbar vertebra, closed, initial encounter (HCC) Active Problems:   Hyperlipidemia   Bradycardia   Senile dementia uncomp, without behavioral disturbance (HCC)   Hypothyroid   Severe  back pain, L3 compression fracture-she has had kyphoplasty's in the past with excellent success.   7/14-IR was consulted, patient is status post kyphoplasty on 7/13 by Dr. Corliss Skains Continue pain control with lidocaine patch, hydrocodone/acetaminophen Consult PT OT Patient is weak and she may need rehab we will follow-up with PT's recommendation    Hypertension-Per daughter her blood pressures are usually good at home. Possibly due to pain. Will add small dose of amlodipine 2.5 mg daily IV hydralazine as needed Pain control-daughter stated pain control well currently Monitor trend   Dementia-continue Lexapro, at risk for delirium and daughter spends the night here.   7/14 -continue standard delirium management with frequent reorientation, as much sunlight as possible during daytime, encourage early mobility.    Chronic A. fib with bradycardia-chronic bradycardia, stable, no symptoms.   7/14 holding anticoagulation since had procedure yesterday Will ask IR when cleared to resume anticoagulation  Type 2 diabetes mellitus-diet controlled, prior A1c was 5.7  Monitor periodically with morning   Hypothyroidism-continue Synthroid     DVT prophylaxis: Lovenox Code Status: Full Family Communication: Daughter at bedside Disposition Plan:  Status is: Inpatient  Remains inpatient appropriate because:Inpatient level of care appropriate due to severity of illness  Dispo: The patient is from: Home              Anticipated d/c is to: Home              Patient currently is not medically stable to d/c.   Difficult to place patient No            LOS: 3 days   Time spent: 35 minutes with more than 50% on COC    Lynn Ito, MD [facility] Pager [phone]  If 7PM-7AM, please contact night-coverage 06/02/2021, 10:02 AM

## 2021-06-03 DIAGNOSIS — S32000A Wedge compression fracture of unspecified lumbar vertebra, initial encounter for closed fracture: Secondary | ICD-10-CM | POA: Diagnosis not present

## 2021-06-03 MED ORDER — HYDROCODONE-ACETAMINOPHEN 5-325 MG PO TABS: 1.0000 | ORAL_TABLET | Freq: Four times a day (QID) | ORAL | 0 refills | Status: DC | PRN

## 2021-06-03 NOTE — Care Management Important Message (Signed)
Important Message  Patient Details  Name: Amy Weiss MRN: [id number] Date of Birth: 05-Aug-1935   Medicare Important Message Given:  Yes - Important Message mailed due to current National Emergency  Verbal consent obtained due to current National Emergency  Relationship to patient: Child Contact Name: [name] Call Date: 06/03/21  Time: 1056 Phone: [phone] Outcome: Spoke with contact Important Message mailed to: Patient address on file    Orson Aloe 06/03/2021, 10:56 AM

## 2021-06-03 NOTE — Progress Notes (Signed)
Nsg Discharge Note  Daughter present for discharge instructions. Questions and concerns answered.   Admit Date:  05/30/2021 Discharge date: 06/03/2021   Abbott Pao to be D/C'd Home per MD order.  AVS completed.  Copy for chart, and copy for patient signed, and dated. Patient/caregiver able to verbalize understanding.  Discharge Medication: Allergies as of 06/03/2021       Reactions   Sulfonamide Derivatives Other (See Comments)   Skin irritation        Medication List     STOP taking these medications    hydrocortisone 2.5 % cream       TAKE these medications    acetaminophen 500 MG tablet Commonly known as: TYLENOL Take 500 mg by mouth every 6 (six) hours as needed (pain).   aspirin EC 81 MG tablet Take 81 mg by mouth every morning.   diclofenac Sodium 1 % Gel Commonly known as: VOLTAREN Apply 2 g topically 4 (four) times daily. What changed:  when to take this reasons to take this   escitalopram 5 MG tablet Commonly known as: LEXAPRO Take 2.5 mg by mouth every morning.   HYDROcodone-acetaminophen 5-325 MG tablet Commonly known as: NORCO/VICODIN Take 1 tablet by mouth every 6 (six) hours as needed for up to 5 days for moderate pain or severe pain. What changed:  how much to take when to take this   levothyroxine 88 MCG tablet Commonly known as: SYNTHROID Take 1 tablet (88 mcg total) by mouth daily before breakfast.   polyethylene glycol 17 g packet Commonly known as: MIRALAX / GLYCOLAX Take 17 g by mouth daily as needed (with each dose of Norco/hydrocodone).        Discharge Assessment: Vitals:   06/03/21 0900 06/03/21 0930  BP: (!) 132/59   Pulse: (!) 37 68  Resp: 15   Temp: 98.8 F (37.1 C)   SpO2: 93%    Skin clean, dry and intact without evidence of skin break down, no evidence of skin tears noted. IV catheter discontinued intact. Site without signs and symptoms of complications - no redness or edema noted at insertion site, patient  denies c/o pain - only slight tenderness at site.  Dressing with slight pressure applied.  D/c Instructions-Education: Discharge instructions given to patient/family with verbalized understanding. D/c education completed with patient/family including follow up instructions, medication list, d/c activities limitations if indicated, with other d/c instructions as indicated by MD - patient able to verbalize understanding, all questions fully answered. Patient instructed to return to ED, call 911, or call MD for any changes in condition.  Patient escorted via WC, and D/C home via private auto.  Boykin Nearing, RN 06/03/2021 12:02 PM

## 2021-06-03 NOTE — TOC Transition Note (Addendum)
Transition of Care [facility]) - CM/SW Discharge Note   Patient Details  Name: Amy Weiss MRN: [id number] Date of Birth: 04-09-1935  Transition of Care [facility]) CM/SW Contact:  Epifanio Lesches, RN Phone Number: 06/03/2021, 11:34 AM   Clinical Narrative:    Patient will DC [facility] Anticipated DC date: 06/03/2021 Family notified: yes Transport by: car           -  s/p L 3 kyphoplasty on 06/01/21 Per MD patient ready for DC today. RN, patient, and patient's daughter notified of DC.   Pt agreeable to home health PT services. Preference [facility]. Referral made with [facility] and accepted, SOC within 48 hours post d/c. Pt without DME needs. Pt without Rx med concerns or affordability issues. Daughter to provide transportation to home.  Post hospital f/u appointment noted on AVS.  RNCM will sign off for now as intervention is no longer needed. Please consult [place] again if new needs arise.    Final next level of care: Home w Home Health Services Barriers to Discharge: No Barriers Identified   Patient Goals and CMS Choice     Choice offered to / list presented to : Patient  Discharge Placement                       Discharge Plan and Services                          HH arranged: PT HH Agency: [facility] Date [facility] Agency Contacted: 06/02/21 Time HH Agency Contacted: [id number] Representative spoke with at [facility] Agency: Pearson Grippe  Social Determinants of Health (SDOH) Interventions     Readmission Risk Interventions No flowsheet data found.

## 2021-06-03 NOTE — Discharge Summary (Signed)
Physician Discharge Summary  Amy Weiss PHX:[id number] DOB: Aug 11, 1935 DOA: 05/30/2021  PCP: [name], MD  Admit date: 05/30/2021 Discharge date: 06/03/2021  Admitted From: Home  Disposition:  Home with [facility]   Recommendations for Outpatient Follow-up:  Follow up with PCP in 1-2 weeks     Home Health: PT OT due to pain with amulation  Equipment/Devices: None new  Discharge Condition: Good  CODE STATUS: FULL Diet recommendation: Regular  Brief/Interim Summary: Amy Weiss is an 86 year old F with history DM, dementia, recurrent compression fractures, and hypothyroidism who presented with back pain.  Patient was performing a minor reaction (lifting a question in her house and twisting at the same time) when she felt sudden onset back pain, and was unable to ambulate.  She went to the ER where MRI showed a new L3 compression fracture.     PRINCIPAL HOSPITAL DIAGNOSIS: L3 compression fracture    Discharge Diagnoses:   L3 compression fracture Patient was admitted, interventional radiology were consulted, the patient underwent balloon kyphoplasty on 7/13 by Dr. Corliss Skains.  Afterwards her pain was difficult to control but eventually subsided and the patient was able to transfer and ambulate with acceptable pain.  Paroxysmal atrial fibrillation not on anticoagulation  Hypertension  Dementia  Hypothyroidism  Diabetes          Discharge Instructions  Discharge Instructions     Discharge instructions   Complete by: As directed    From Dr. Maryfrances Bunnell: You were admitted for a compression fracture.   You had a balloon kyphoplasty by Dr. Corliss Skains on vertebra L3  You should follow up with Dr. Drue Novel in 1 week  Take hydrocodone-acetaminophen as needed for pain, up to every 6 hours Take 1/2 tablet at first, wait 45 minutes and see if this helps.  If not take another 1/2 tablet and wait until the next dose is due 6 hours later to take a whole tablet.  Find your dose  in that way.  Recognize that hydrocodone is associated with constipation, confusion and falls and as we discussed, should be minimized.   Increase activity slowly   Complete by: As directed    No wound care   Complete by: As directed       Allergies as of 06/03/2021       Reactions   Sulfonamide Derivatives Other (See Comments)   Skin irritation        Medication List     STOP taking these medications    hydrocortisone 2.5 % cream       TAKE these medications    acetaminophen 500 MG tablet Commonly known as: TYLENOL Take 500 mg by mouth every 6 (six) hours as needed (pain).   aspirin EC 81 MG tablet Take 81 mg by mouth every morning.   diclofenac Sodium 1 % Gel Commonly known as: VOLTAREN Apply 2 g topically 4 (four) times daily. What changed:  when to take this reasons to take this   escitalopram 5 MG tablet Commonly known as: LEXAPRO Take 2.5 mg by mouth every morning.   HYDROcodone-acetaminophen 5-325 MG tablet Commonly known as: NORCO/VICODIN Take 1 tablet by mouth every 6 (six) hours as needed for up to 5 days for moderate pain or severe pain. What changed:  how much to take when to take this   levothyroxine 88 MCG tablet Commonly known as: SYNTHROID Take 1 tablet (88 mcg total) by mouth daily before breakfast.   polyethylene glycol 17 g packet Commonly known as: MIRALAX /  GLYCOLAX Take 17 g by mouth daily as needed (with each dose of Norco/hydrocodone).        Follow-up Information     Wanda PlumpPaz, Jose E, MD. Schedule an appointment as soon as possible for a visit in 1 week(s).   Specialty: Internal Medicine Contact information: [address] [place] [id number] [phone]                Allergies  Allergen Reactions   Sulfonamide Derivatives Other (See Comments)    Skin irritation    Consultations: Interventional radiology   Procedures/Studies: MR THORACIC SPINE WO CONTRAST  Result Date:  05/30/2021 CLINICAL DATA:  Recurrent back pain. History of prior thoracolumbar compression fractures status post augmentation. EXAM: MRI THORACIC AND LUMBAR SPINE WITHOUT CONTRAST TECHNIQUE: Multiplanar and multiecho pulse sequences of the thoracic and lumbar spine were obtained without intravenous contrast. COMPARISON:  MRI thoracic and lumbar spine dated January 28, 2020. FINDINGS: MRI THORACIC SPINE FINDINGS Alignment: Unchanged mild dextrocurvature of the mid to lower thoracic spine. No listhesis. Vertebrae: No acute fracture, evidence of discitis, or bone lesion. Chronic T10 and T12 compression deformity status post augmentation. Cord:  Normal signal and morphology. Paraspinal and other soft tissues: Negative. Disc levels: Unchanged minimal disc bulging at T11-T12 and scattered mild thoracic facet arthropathy. No spinal canal or neuroforaminal stenosis. MRI LUMBAR SPINE FINDINGS Segmentation:  Standard. Alignment:  Unchanged mild levoscoliosis.  No significant listhesis. Vertebrae: New acute L3 superior endplate compression fracture with minimal height loss, approximately 10%. Chronic mild L4 superior endplate compression fracture, new since March 2021. Unchanged chronic L1 and L2 compression deformities status post augmentation. Unchanged chronic mild L5 compression deformity. No evidence of discitis or suspicious bone lesion. Conus medullaris and cauda equina: Conus extends to the L1 level. Conus and cauda equina appear normal. Paraspinal and other soft tissues: Negative. Disc levels: T12-L1: No significant disc bulge or herniation. Unchanged minimal retrolisthesis of the L1 posterosuperior endplate. No stenosis. L1-L2:  Unchanged mild left-sided disc bulging.  No stenosis. L2-L3:  Unchanged mild left-sided disc bulging.  No stenosis. L3-L4: Unchanged mild disc bulging and bilateral facet arthropathy. No stenosis. L4-L5: Unchanged mild disc bulging. Unchanged mild bilateral facet arthropathy. No stenosis.  L5-S1: Unchanged minimal disc bulging and small left foraminal disc osteophyte complex. Unchanged mild left facet arthropathy. No stenosis. IMPRESSION: 1. Acute L3 superior endplate compression fracture with minimal height loss. 2. Chronic mild L4 superior endplate compression fracture, new since March 2021. 3. Chronic T10, T12, L1, and L2 compression fractures status post augmentation. 4. Unchanged mild multilevel degenerative changes of the thoracolumbar spine without stenosis. Electronically Signed   By: Obie DredgeWilliam T Derry M.D.   On: 05/30/2021 11:46   MR LUMBAR SPINE WO CONTRAST  Result Date: 05/30/2021 CLINICAL DATA:  Recurrent back pain. History of prior thoracolumbar compression fractures status post augmentation. EXAM: MRI THORACIC AND LUMBAR SPINE WITHOUT CONTRAST TECHNIQUE: Multiplanar and multiecho pulse sequences of the thoracic and lumbar spine were obtained without intravenous contrast. COMPARISON:  MRI thoracic and lumbar spine dated January 28, 2020. FINDINGS: MRI THORACIC SPINE FINDINGS Alignment: Unchanged mild dextrocurvature of the mid to lower thoracic spine. No listhesis. Vertebrae: No acute fracture, evidence of discitis, or bone lesion. Chronic T10 and T12 compression deformity status post augmentation. Cord:  Normal signal and morphology. Paraspinal and other soft tissues: Negative. Disc levels: Unchanged minimal disc bulging at T11-T12 and scattered mild thoracic facet arthropathy. No spinal canal or neuroforaminal stenosis. MRI LUMBAR SPINE FINDINGS Segmentation:  Standard. Alignment:  Unchanged mild levoscoliosis.  No significant listhesis. Vertebrae: New acute L3 superior endplate compression fracture with minimal height loss, approximately 10%. Chronic mild L4 superior endplate compression fracture, new since March 2021. Unchanged chronic L1 and L2 compression deformities status post augmentation. Unchanged chronic mild L5 compression deformity. No evidence of discitis or suspicious bone  lesion. Conus medullaris and cauda equina: Conus extends to the L1 level. Conus and cauda equina appear normal. Paraspinal and other soft tissues: Negative. Disc levels: T12-L1: No significant disc bulge or herniation. Unchanged minimal retrolisthesis of the L1 posterosuperior endplate. No stenosis. L1-L2:  Unchanged mild left-sided disc bulging.  No stenosis. L2-L3:  Unchanged mild left-sided disc bulging.  No stenosis. L3-L4: Unchanged mild disc bulging and bilateral facet arthropathy. No stenosis. L4-L5: Unchanged mild disc bulging. Unchanged mild bilateral facet arthropathy. No stenosis. L5-S1: Unchanged minimal disc bulging and small left foraminal disc osteophyte complex. Unchanged mild left facet arthropathy. No stenosis. IMPRESSION: 1. Acute L3 superior endplate compression fracture with minimal height loss. 2. Chronic mild L4 superior endplate compression fracture, new since March 2021. 3. Chronic T10, T12, L1, and L2 compression fractures status post augmentation. 4. Unchanged mild multilevel degenerative changes of the thoracolumbar spine without stenosis. Electronically Signed   By: Obie Dredge M.D.   On: 05/30/2021 11:46      Subjective: Back pain is tolerable.  No new symptoms.  Discharge Exam: Vitals:   06/03/21 0900 06/03/21 0930  BP: (!) 132/59   Pulse: (!) 37 68  Resp: 15   Temp: 98.8 F (37.1 C)   SpO2: 93%    Vitals:   06/02/21 1401 06/02/21 2147 06/03/21 0900 06/03/21 0930  BP: (!) 107/48 130/72 (!) 132/59   Pulse: (!) 40 (!) 40 (!) 37 68  Resp: 17 16 15    Temp: 98.7 F (37.1 C) 97.8 F (36.6 C) 98.8 F (37.1 C)   TempSrc: Oral Oral Oral   SpO2: 96% 94% 93%   Weight:      Height:        General: Pt is alert, awake, not in acute distress Cardiovascular: RRR, nl S1-S2, no murmurs appreciated.   No LE edema.   Respiratory: Normal respiratory rate and rhythm.  CTAB without rales or wheezes. Abdominal: Abdomen soft and non-tender.  No distension or HSM.    Neuro/Psych: Strength symmetric in upper and lower extremities.  Judgment and insight appear impaired.   The results of significant diagnostics from this hospitalization (including imaging, microbiology, ancillary and laboratory) are listed below for reference.     Microbiology: Recent Results (from the past 240 hour(s))  Resp Panel by RT-PCR (Flu A&B, Covid) Nasopharyngeal Swab     Status: None   Collection Time: 05/30/21  2:55 PM   Specimen: Nasopharyngeal Swab; Nasopharyngeal(NP) swabs in vial transport medium  Result Value Ref Range Status   SARS Coronavirus 2 by RT PCR NEGATIVE NEGATIVE Final    Comment: (NOTE) SARS-CoV-2 target nucleic acids are NOT DETECTED.  The SARS-CoV-2 RNA is generally detectable in upper respiratory specimens during the acute phase of infection. The lowest concentration of SARS-CoV-2 viral copies this assay can detect is 138 copies/mL. A negative result does not preclude SARS-Cov-2 infection and should not be used as the sole basis for treatment or other patient management decisions. A negative result may occur with  improper specimen collection/handling, submission of specimen other than nasopharyngeal swab, presence of viral mutation(s) within the areas targeted by this assay, and inadequate number of viral  copies(<138 copies/mL). A negative result must be combined with clinical observations, patient history, and epidemiological information. The expected result is Negative.  Fact Sheet for Patients:  BloggerCourse.com  Fact Sheet for Healthcare Providers:  SeriousBroker.it  This test is no t yet approved or cleared by the [place] FDA and  has been authorized for detection and/or diagnosis of SARS-CoV-2 by FDA under an Emergency Use Authorization (EUA). This EUA will remain  in effect (meaning this test can be used) for the duration of the COVID-19 declaration under Section 564(b)(1) of the  Act, 21 U.S.C.section 360bbb-3(b)(1), unless the authorization is terminated  or revoked sooner.       Influenza A by PCR NEGATIVE NEGATIVE Final   Influenza B by PCR NEGATIVE NEGATIVE Final    Comment: (NOTE) The Xpert Xpress SARS-CoV-2/FLU/RSV plus assay is intended as an aid in the diagnosis of influenza from Nasopharyngeal swab specimens and should not be used as a sole basis for treatment. Nasal washings and aspirates are unacceptable for Xpert Xpress SARS-CoV-2/FLU/RSV testing.  Fact Sheet for Patients: BloggerCourse.com  Fact Sheet for Healthcare Providers: SeriousBroker.it  This test is not yet approved or cleared by the [place] FDA and has been authorized for detection and/or diagnosis of SARS-CoV-2 by FDA under an Emergency Use Authorization (EUA). This EUA will remain in effect (meaning this test can be used) for the duration of the COVID-19 declaration under Section 564(b)(1) of the Act, 21 U.S.C. section 360bbb-3(b)(1), unless the authorization is terminated or revoked.  Performed at [facility], [address] [place], [place] [address]      Labs: BNP (last 3 results) No results for input(s): BNP in the last 8760 hours. Basic Metabolic Panel: Recent Labs  Lab 05/30/21 1439 05/31/21 0201 06/01/21 0322  NA 137 137 137  K 4.0 4.3 4.3  CL 106 105 107  CO2 GLUCOSE 110* 127* 103*  BUN CREATININE 0.76 0.72 0.64  CALCIUM 8.7* 8.5* 8.8*   Liver Function Tests: No results for input(s): AST, ALT, ALKPHOS, BILITOT, PROT, ALBUMIN in the last 168 hours. No results for input(s): LIPASE, AMYLASE in the last 168 hours. No results for input(s): AMMONIA in the last 168 hours. CBC: Recent Labs  Lab 05/30/21 1439 05/31/21 0201 06/01/21 0322  WBC 7.1 7.0 7.0  NEUTROABS 4.1  --   --   HGB 13.4 12.2 12.9  HCT 41.2 37.6 39.6  MCV 89.2 87.6 87.0  PLT 244 228 233   Cardiac  Enzymes: No results for input(s): CKTOTAL, CKMB, CKMBINDEX, TROPONINI in the last 168 hours. BNP: Invalid input(s): POCBNP CBG: No results for input(s): GLUCAP in the last 168 hours. D-Dimer No results for input(s): DDIMER in the last 72 hours. Hgb A1c No results for input(s): HGBA1C in the last 72 hours. Lipid Profile No results for input(s): CHOL, HDL, LDLCALC, TRIG, CHOLHDL, LDLDIRECT in the last 72 hours. Thyroid function studies No results for input(s): TSH, T4TOTAL, T3FREE, THYROIDAB in the last 72 hours.  Invalid input(s): FREET3 Anemia work up No results for input(s): VITAMINB12, FOLATE, FERRITIN, TIBC, IRON, RETICCTPCT in the last 72 hours. Urinalysis    Component Value Date/Time   COLORURINE YELLOW 02/05/2020 0857   APPEARANCEUR CLEAR 02/05/2020 0857   LABSPEC 1.015 02/05/2020 0857   PHURINE 8.0 02/05/2020 0857   GLUCOSEU NEGATIVE 02/05/2020 0857   HGBUR NEGATIVE 02/05/2020 0857   BILIRUBINUR NEGATIVE 02/05/2020 0857   KETONESUR NEGATIVE 02/05/2020 0857  PROTEINUR 30 (A) 01/28/2020 0759   UROBILINOGEN 0.2 02/05/2020 0857   NITRITE NEGATIVE 02/05/2020 0857   LEUKOCYTESUR NEGATIVE 02/05/2020 0857   Sepsis Labs Invalid input(s): PROCALCITONIN,  WBC,  LACTICIDVEN Microbiology Recent Results (from the past 240 hour(s))  Resp Panel by RT-PCR (Flu A&B, Covid) Nasopharyngeal Swab     Status: None   Collection Time: 05/30/21  2:55 PM   Specimen: Nasopharyngeal Swab; Nasopharyngeal(NP) swabs in vial transport medium  Result Value Ref Range Status   SARS Coronavirus 2 by RT PCR NEGATIVE NEGATIVE Final    Comment: (NOTE) SARS-CoV-2 target nucleic acids are NOT DETECTED.  The SARS-CoV-2 RNA is generally detectable in upper respiratory specimens during the acute phase of infection. The lowest concentration of SARS-CoV-2 viral copies this assay can detect is 138 copies/mL. A negative result does not preclude SARS-Cov-2 infection and should not be used as the sole basis  for treatment or other patient management decisions. A negative result may occur with  improper specimen collection/handling, submission of specimen other than nasopharyngeal swab, presence of viral mutation(s) within the areas targeted by this assay, and inadequate number of viral copies(<138 copies/mL). A negative result must be combined with clinical observations, patient history, and epidemiological information. The expected result is Negative.  Fact Sheet for Patients:  BloggerCourse.com  Fact Sheet for Healthcare Providers:  SeriousBroker.it  This test is no t yet approved or cleared by the [place] FDA and  has been authorized for detection and/or diagnosis of SARS-CoV-2 by FDA under an Emergency Use Authorization (EUA). This EUA will remain  in effect (meaning this test can be used) for the duration of the COVID-19 declaration under Section 564(b)(1) of the Act, 21 U.S.C.section 360bbb-3(b)(1), unless the authorization is terminated  or revoked sooner.       Influenza A by PCR NEGATIVE NEGATIVE Final   Influenza B by PCR NEGATIVE NEGATIVE Final    Comment: (NOTE) The Xpert Xpress SARS-CoV-2/FLU/RSV plus assay is intended as an aid in the diagnosis of influenza from Nasopharyngeal swab specimens and should not be used as a sole basis for treatment. Nasal washings and aspirates are unacceptable for Xpert Xpress SARS-CoV-2/FLU/RSV testing.  Fact Sheet for Patients: BloggerCourse.com  Fact Sheet for Healthcare Providers: SeriousBroker.it  This test is not yet approved or cleared by the [place] FDA and has been authorized for detection and/or diagnosis of SARS-CoV-2 by FDA under an Emergency Use Authorization (EUA). This EUA will remain in effect (meaning this test can be used) for the duration of the COVID-19 declaration under Section 564(b)(1) of the Act, 21  U.S.C. section 360bbb-3(b)(1), unless the authorization is terminated or revoked.  Performed at [facility], [address] [place], [place] [address]      Time coordinating discharge: 25 minutes The [place] controlled substances registry was reviewed for this patient prior to filling the <5 days supply controlled substances script.    30 Day Unplanned Readmission Risk Score    Flowsheet Row ED to Hosp-Admission (Current) from 05/30/2021 in [facility]  30 Day Unplanned Readmission Risk Score (%) 9.18 Filed at 06/03/2021 0801       This score is the patient's risk of an unplanned readmission within 30 days of being discharged (0 -100%). The score is based on dignosis, age, lab data, medications, orders, and past utilization.   Low:  0-14.9   Medium: 15-21.9   High: 22-29.9   Extreme: 30 and above  SIGNED:   Alberteen Sam, MD  [facility] 06/03/2021, 11:21 PM

## 2021-06-03 NOTE — Progress Notes (Signed)
Amy Weiss is 86 year old female s/p L3 balloon kyphoplasty with Dr. Corliss Skains on 06/01/2021.   Patient has a history of dementia, follow-up history was obtained from her daughter, Amy Weiss. Patient had previous kyphoplasty with Dr. Corliss Skains. Amy Weiss states that patient's recovery seems to be slower this time, and it seems that the patient's mentation is slightly declining every time the patient received sedation. Daughter states that the patient was able to move to window and back to her bed twice with PT yesterday, and patient was able to go to bathroom with assistance from nurse and using a walker today.   Amy Weiss is concerned about patient's recovery as patient is much less active this time then her previous kyphoplasty recovery. Informed Amy Weiss the possibility of slower recovery as patient is less active this time.  Suggested increasing activity slowly as tolerated. Hopefully patient will be able to get better with home health PT.  Discussed post kyphoplasty instructions, no bending, stooping, lifting more than 10 pounds for 2 weeks, no driving for several weeks, use a 2 wheeled walker when ambulating. Inform daughter that follow-up will be as needed basis, recommended contacting [facility] IR if the patient develops any problems regarding the kyphoplasty. Amy Weiss verbalized understanding.  Please call [facility] IR for further questions and concerns. It was a pleasure meeting Amy Weiss and Amy Weiss.  Amy Bologna Sharea Guinther PA-C 06/03/2021 11:44 AM

## 2021-06-06 ENCOUNTER — Telehealth: Payer: Self-pay

## 2021-06-06 NOTE — Telephone Encounter (Signed)
Transition Care Management Unsuccessful Follow-up Telephone Call  Date of discharge and from where:  06/03/21 from [facility]  Attempts:  1st Attempt  Reason for unsuccessful TCM follow-up call:  Left voice message. HFU scheduled 06/10/21 @ 11:20. Will follow as appropriate.

## 2021-06-07 NOTE — Telephone Encounter (Signed)
Transition Care Management Unsuccessful Follow-up Telephone Call  Date of discharge and from where:  06/03/21 from [facility]  Attempts:  2nd Attempt  Reason for unsuccessful TCM follow-up call:  Left voice message

## 2021-06-10 ENCOUNTER — Other Ambulatory Visit: Payer: Self-pay

## 2021-06-10 ENCOUNTER — Telehealth (INDEPENDENT_AMBULATORY_CARE_PROVIDER_SITE_OTHER): Payer: Medicare Other | Admitting: Internal Medicine

## 2021-06-10 ENCOUNTER — Encounter: Payer: Self-pay | Admitting: Internal Medicine

## 2021-06-10 VITALS — Ht 67.0 in | Wt 150.0 lb

## 2021-06-10 DIAGNOSIS — M8000XS Age-related osteoporosis with current pathological fracture, unspecified site, sequela: Secondary | ICD-10-CM | POA: Diagnosis not present

## 2021-06-10 DIAGNOSIS — E039 Hypothyroidism, unspecified: Secondary | ICD-10-CM

## 2021-06-10 DIAGNOSIS — F0390 Unspecified dementia without behavioral disturbance: Secondary | ICD-10-CM | POA: Diagnosis not present

## 2021-06-10 MED ORDER — HYDROCODONE-ACETAMINOPHEN 5-325 MG PO TABS: 1.0000 | ORAL_TABLET | Freq: Two times a day (BID) | ORAL | Status: DC | PRN

## 2021-06-10 NOTE — Progress Notes (Signed)
Subjective:    Patient ID: Amy Weiss, female    DOB: Jul 08, 1935, 86 y.o.   MRN: [id number]  DOS:  06/10/2021 Type of visit - description: Hospital follow-up, TCM 14     Virtual Visit via Video Note  I connected with the above patient  by a video enabled telemedicine application and verified that I am speaking with the correct person using two identifiers.   THIS ENCOUNTER IS A VIRTUAL VISIT DUE TO COVID-19 - PATIENT WAS NOT SEEN IN THE OFFICE. PATIENT HAS CONSENTED TO VIRTUAL VISIT / TELEMEDICINE VISIT   Location of patient: home  Location of provider: office  Persons participating in the virtual visit: patient, provider   I discussed the limitations of evaluation and management by telemedicine and the availability of in person appointments. The patient expressed understanding and agreed to proceed.       Admitted to hospital 05/30/2021, discharged 06/03/2021.   She presented with severe pain from a vertebral fracture, underwent a balloon kyphoplasty 06/01/2021. After the procedure, the pain gradually subsided and the patient was able to transfer ambulate okay.  Since she left the hospital, she is at home under the care of her family. I spoke with her daughter today, she is very involved in their care and doing a great job. Currently taking hydrocodone twice daily.  They noted that her dementia is at baseline but shortly after a hydrocodone dose she gets slightly more confused. Nevertheless, they are making her to stay active, taking walks.  Fortunately no falls or problems.  Denies any constipation. No nausea or vomiting. Appetite is "so-so"    Review of Systems See above   Past Medical History:  Diagnosis Date   Diabetes mellitus 4/09   A1C-6   Familial tremor    Hyperlipidemia    Hypothyroidism    Osteoporosis    Tachycardia    AV Re-entry, s/p ablation aprox 2005   TIA (transient ischemic attack) 12/09    Past Surgical History:  Procedure Laterality  Date   ABDOMINAL HYSTERECTOMY     no oophorectomy   BLADDER SURGERY     x 2 in the 80s   BREAST BIOPSY     L (-)   IR KYPHO EA ADDL LEVEL THORACIC OR LUMBAR  12/29/2019   IR KYPHO LUMBAR INC FX REDUCE BONE BX UNI/BIL CANNULATION INC/IMAGING  01/06/2020   IR KYPHO LUMBAR INC FX REDUCE BONE BX UNI/BIL CANNULATION INC/IMAGING  06/01/2021   IR KYPHO THORACIC WITH BONE BIOPSY  12/29/2019   IR KYPHO THORACIC WITH BONE BIOPSY  02/02/2020    Allergies as of 06/10/2021       Reactions   Sulfonamide Derivatives Other (See Comments)   Skin irritation        Medication List        Accurate as of June 10, 2021 11:43 AM. If you have any questions, ask your nurse or doctor.          acetaminophen 500 MG tablet Commonly known as: TYLENOL Take 500 mg by mouth every 6 (six) hours as needed (pain).   aspirin EC 81 MG tablet Take 81 mg by mouth every morning.   diclofenac Sodium 1 % Gel Commonly known as: VOLTAREN Apply 2 g topically 4 (four) times daily.   escitalopram 5 MG tablet Commonly known as: LEXAPRO Take 2.5 mg by mouth every morning.   HYDROcodone-acetaminophen 5-325 MG tablet Commonly known as: NORCO/VICODIN Take 1 tablet by mouth 2 (two) times daily as  needed for moderate pain or severe pain. What changed: when to take this Changed by: Willow Ora, MD   levothyroxine 88 MCG tablet Commonly known as: SYNTHROID Take 1 tablet (88 mcg total) by mouth daily before breakfast.   polyethylene glycol 17 g packet Commonly known as: MIRALAX / GLYCOLAX Take 17 g by mouth daily as needed (with each dose of Norco/hydrocodone).           Objective:   Physical Exam Ht 5\' 7"  (1.702 m)   Wt 150 lb (68 kg)   BMI 23.49 kg/m  The patient is sitting in a chair, she seems to be well.  Pleasantly demented, does not seem to be in pain.  She recognizes me.    Assessment     Assessment Prediabetes   dx 2009 >>>  A1c 6.0 Hypothyroidism TIA 2009 MSK: --DJD  --Osteoporosis (started  Fosamax 01-2020 after fracture) ---Vertebral fracture 12-2019, kyphoplasty, admitted for pain control Familial tremor CV: see cardiology at [facility] ---AV reentry tachycardia, ablation 2005 ---Bradycardic , Mobitz I , junctional escapes -- saw cards @ [facility], f/u 1 year, pt decided not to go back History of pressure ulcers Dementia (symptoms noticeable started 12-2019)  PLAN Admitted to the hospital, medical records, labs and x-rays reviewed. Compression fracture: Since the last visit, was admitted to the hospital, had a kyphoplasty, she is now back home. Currently taking Vicodin twice daily, pain is a still there, I spoke with the patient's daughter, pros and cons of increase Vicodin discussed, at this time we agreed to continue Vicodin twice daily and add at least 1 Tylenol dose. Hypothyroidism: Unfortunately, TSH was not checked in the hospital.  Unable to check now. Gait and transferring: Given her overall health, she definitely is benefiting from having a hospital bed and railings.  These allow her to change positions frequently, prevent pressure ulcers, find comfortable positions.  Also she is able to push herself up with the railings. Osteoporosis: Unable to treat, see previous entries.   I discussed the assessment and treatment plan with the patient. The patient was provided an opportunity to ask questions and all were answered. The patient agreed with the plan and demonstrated an understanding of the instructions.   The patient was advised to call back or seek an in-person evaluation if the symptoms worsen or if the condition fails to improve as anticipated.

## 2021-06-11 NOTE — Assessment & Plan Note (Signed)
Admitted to the hospital, medical records, labs and x-rays reviewed. Compression fracture: Since the last visit, was admitted to the hospital, had a kyphoplasty, she is now back home. Currently taking Vicodin twice daily, pain is a still there, I spoke with the patient's daughter, pros and cons of increase Vicodin discussed, at this time we agreed to continue Vicodin twice daily and add at least 1 Tylenol dose. Hypothyroidism: Unfortunately, TSH was not checked in the hospital.  Unable to check now. Gait and transferring: Given her overall health, she definitely is benefiting from having a hospital bed and railings.  These allow her to change positions frequently, prevent pressure ulcers, find comfortable positions.  Also she is able to push herself up with the railings. Osteoporosis: Unable to treat, see previous entries.

## 2021-06-13 ENCOUNTER — Telehealth: Payer: Self-pay | Admitting: Internal Medicine

## 2021-06-13 NOTE — Telephone Encounter (Signed)
Ok for verbal orders for PT.

## 2021-06-13 NOTE — Telephone Encounter (Signed)
PT Verbal requested  on  2 tiems a week for  4 weeks 1 time a week for  4 weeks if needed.   [phone] Tasia Catchings with [facility] for Physical Therapy

## 2021-06-13 NOTE — Telephone Encounter (Signed)
That is okay thank you 

## 2021-06-13 NOTE — Telephone Encounter (Signed)
Left message on machine with verbal orders. 

## 2021-06-16 ENCOUNTER — Telehealth: Payer: Self-pay

## 2021-06-16 DIAGNOSIS — Z87891 Personal history of nicotine dependence: Secondary | ICD-10-CM

## 2021-06-16 DIAGNOSIS — G250 Essential tremor: Secondary | ICD-10-CM

## 2021-06-16 DIAGNOSIS — Z79891 Long term (current) use of opiate analgesic: Secondary | ICD-10-CM

## 2021-06-16 DIAGNOSIS — M8008XD Age-related osteoporosis with current pathological fracture, vertebra(e), subsequent encounter for fracture with routine healing: Secondary | ICD-10-CM

## 2021-06-16 DIAGNOSIS — I10 Essential (primary) hypertension: Secondary | ICD-10-CM | POA: Diagnosis not present

## 2021-06-16 DIAGNOSIS — Z9181 History of falling: Secondary | ICD-10-CM

## 2021-06-16 DIAGNOSIS — I480 Paroxysmal atrial fibrillation: Secondary | ICD-10-CM | POA: Diagnosis not present

## 2021-06-16 DIAGNOSIS — E119 Type 2 diabetes mellitus without complications: Secondary | ICD-10-CM | POA: Diagnosis not present

## 2021-06-16 DIAGNOSIS — Z8673 Personal history of transient ischemic attack (TIA), and cerebral infarction without residual deficits: Secondary | ICD-10-CM

## 2021-06-16 DIAGNOSIS — E039 Hypothyroidism, unspecified: Secondary | ICD-10-CM

## 2021-06-16 DIAGNOSIS — Z90710 Acquired absence of both cervix and uterus: Secondary | ICD-10-CM

## 2021-06-16 DIAGNOSIS — F0280 Dementia in other diseases classified elsewhere without behavioral disturbance: Secondary | ICD-10-CM

## 2021-06-16 DIAGNOSIS — Z87311 Personal history of (healed) other pathological fracture: Secondary | ICD-10-CM

## 2021-06-16 DIAGNOSIS — E785 Hyperlipidemia, unspecified: Secondary | ICD-10-CM

## 2021-06-16 NOTE — Telephone Encounter (Signed)
Plan of care signed and faxed back to [facility] at [phone]. Form sent for scanning.  

## 2021-06-23 ENCOUNTER — Other Ambulatory Visit: Payer: Self-pay | Admitting: Internal Medicine

## 2021-06-28 ENCOUNTER — Other Ambulatory Visit: Payer: Self-pay | Admitting: Internal Medicine

## 2021-06-29 ENCOUNTER — Telehealth: Payer: Self-pay

## 2021-06-29 NOTE — Telephone Encounter (Signed)
Orders from [facility] signed and mailed back in envelope provided. Copies of form/orders sent for scanning.

## 2021-06-30 ENCOUNTER — Telehealth: Payer: Self-pay

## 2021-06-30 NOTE — Telephone Encounter (Signed)
Received a call from [facility] requesting verbal order for patient to have PT 2 weeks x4 and 1 week x5. Ok per Dr. Drue Novel verbal orders given.

## 2021-06-30 NOTE — Telephone Encounter (Signed)
Plan of care signed and faxed back to [facility] at [phone]. Form sent for scanning.  

## 2021-07-06 ENCOUNTER — Emergency Department (HOSPITAL_COMMUNITY): Payer: Medicare Other

## 2021-07-06 ENCOUNTER — Inpatient Hospital Stay (HOSPITAL_COMMUNITY)
Admission: EM | Admit: 2021-07-06 | Discharge: 2021-07-11 | DRG: 517 | Disposition: A | Discharge status: Home Health | Payer: Medicare Other | Attending: Family Medicine | Admitting: Family Medicine

## 2021-07-06 ENCOUNTER — Telehealth: Payer: Self-pay | Admitting: Internal Medicine

## 2021-07-06 ENCOUNTER — Encounter (HOSPITAL_COMMUNITY): Payer: Self-pay

## 2021-07-06 DIAGNOSIS — Z20822 Contact with and (suspected) exposure to covid-19: Secondary | ICD-10-CM | POA: Diagnosis present

## 2021-07-06 DIAGNOSIS — M4854XA Collapsed vertebra, not elsewhere classified, thoracic region, initial encounter for fracture: Secondary | ICD-10-CM | POA: Diagnosis present

## 2021-07-06 DIAGNOSIS — Z87891 Personal history of nicotine dependence: Secondary | ICD-10-CM | POA: Diagnosis not present

## 2021-07-06 DIAGNOSIS — R6510 Systemic inflammatory response syndrome (SIRS) of non-infectious origin without acute organ dysfunction: Secondary | ICD-10-CM

## 2021-07-06 DIAGNOSIS — Z66 Do not resuscitate: Secondary | ICD-10-CM | POA: Diagnosis present

## 2021-07-06 DIAGNOSIS — Z800 Family history of malignant neoplasm of digestive organs: Secondary | ICD-10-CM | POA: Diagnosis not present

## 2021-07-06 DIAGNOSIS — Z90710 Acquired absence of both cervix and uterus: Secondary | ICD-10-CM | POA: Diagnosis not present

## 2021-07-06 DIAGNOSIS — F0390 Unspecified dementia without behavioral disturbance: Secondary | ICD-10-CM | POA: Diagnosis present

## 2021-07-06 DIAGNOSIS — Z7982 Long term (current) use of aspirin: Secondary | ICD-10-CM

## 2021-07-06 DIAGNOSIS — M546 Pain in thoracic spine: Secondary | ICD-10-CM | POA: Diagnosis not present

## 2021-07-06 DIAGNOSIS — I10 Essential (primary) hypertension: Secondary | ICD-10-CM | POA: Diagnosis present

## 2021-07-06 DIAGNOSIS — R001 Bradycardia, unspecified: Secondary | ICD-10-CM

## 2021-07-06 DIAGNOSIS — R5381 Other malaise: Secondary | ICD-10-CM | POA: Diagnosis present

## 2021-07-06 DIAGNOSIS — S22060D Wedge compression fracture of T7-T8 vertebra, subsequent encounter for fracture with routine healing: Secondary | ICD-10-CM | POA: Diagnosis not present

## 2021-07-06 DIAGNOSIS — M4856XA Collapsed vertebra, not elsewhere classified, lumbar region, initial encounter for fracture: Principal | ICD-10-CM | POA: Diagnosis present

## 2021-07-06 DIAGNOSIS — E039 Hypothyroidism, unspecified: Secondary | ICD-10-CM | POA: Diagnosis present

## 2021-07-06 DIAGNOSIS — Z882 Allergy status to sulfonamides status: Secondary | ICD-10-CM

## 2021-07-06 DIAGNOSIS — X58XXXA Exposure to other specified factors, initial encounter: Secondary | ICD-10-CM | POA: Diagnosis present

## 2021-07-06 DIAGNOSIS — E785 Hyperlipidemia, unspecified: Secondary | ICD-10-CM | POA: Diagnosis present

## 2021-07-06 DIAGNOSIS — Z79899 Other long term (current) drug therapy: Secondary | ICD-10-CM | POA: Diagnosis not present

## 2021-07-06 DIAGNOSIS — D649 Anemia, unspecified: Secondary | ICD-10-CM | POA: Diagnosis present

## 2021-07-06 DIAGNOSIS — Z79890 Hormone replacement therapy (postmenopausal): Secondary | ICD-10-CM

## 2021-07-06 DIAGNOSIS — Z8249 Family history of ischemic heart disease and other diseases of the circulatory system: Secondary | ICD-10-CM | POA: Diagnosis not present

## 2021-07-06 DIAGNOSIS — Z8673 Personal history of transient ischemic attack (TIA), and cerebral infarction without residual deficits: Secondary | ICD-10-CM | POA: Diagnosis not present

## 2021-07-06 DIAGNOSIS — S22060A Wedge compression fracture of T7-T8 vertebra, initial encounter for closed fracture: Secondary | ICD-10-CM | POA: Diagnosis not present

## 2021-07-06 DIAGNOSIS — E559 Vitamin D deficiency, unspecified: Secondary | ICD-10-CM | POA: Diagnosis present

## 2021-07-06 DIAGNOSIS — E119 Type 2 diabetes mellitus without complications: Secondary | ICD-10-CM

## 2021-07-06 DIAGNOSIS — R509 Fever, unspecified: Secondary | ICD-10-CM | POA: Diagnosis not present

## 2021-07-06 DIAGNOSIS — Z888 Allergy status to other drugs, medicaments and biological substances status: Secondary | ICD-10-CM | POA: Diagnosis not present

## 2021-07-06 DIAGNOSIS — S32030A Wedge compression fracture of third lumbar vertebra, initial encounter for closed fracture: Secondary | ICD-10-CM

## 2021-07-06 DIAGNOSIS — S32039K Unspecified fracture of third lumbar vertebra, subsequent encounter for fracture with nonunion: Secondary | ICD-10-CM | POA: Diagnosis not present

## 2021-07-06 DIAGNOSIS — M549 Dorsalgia, unspecified: Secondary | ICD-10-CM | POA: Diagnosis present

## 2021-07-06 LAB — CBC WITH DIFFERENTIAL/PLATELET
Abs Immature Granulocytes: 0.03 10*3/uL (ref 0.00–0.07)
Basophils Absolute: 0 10*3/uL (ref 0.0–0.1)
Basophils Relative: 1 %
Eosinophils Absolute: 0.1 10*3/uL (ref 0.0–0.5)
Eosinophils Relative: 1 %
HCT: 41.7 % (ref 36.0–46.0)
Hemoglobin: 13.4 g/dL (ref 12.0–15.0)
Immature Granulocytes: 0 %
Lymphocytes Relative: 25 %
Lymphs Abs: 2 10*3/uL (ref 0.7–4.0)
MCH: 29.3 pg (ref 26.0–34.0)
MCHC: 32.1 g/dL (ref 30.0–36.0)
MCV: 91 fL (ref 80.0–100.0)
Monocytes Absolute: 1 10*3/uL (ref 0.1–1.0)
Monocytes Relative: 13 %
Neutro Abs: 4.8 10*3/uL (ref 1.7–7.7)
Neutrophils Relative %: 60 %
Platelets: 241 10*3/uL (ref 150–400)
RBC: 4.58 MIL/uL (ref 3.87–5.11)
RDW: 15.3 % (ref 11.5–15.5)
WBC: 8 10*3/uL (ref 4.0–10.5)
nRBC: 0 % (ref 0.0–0.2)

## 2021-07-06 LAB — BASIC METABOLIC PANEL
Anion gap: 7 (ref 5–15)
BUN: 15 mg/dL (ref 8–23)
CO2: 24 mmol/L (ref 22–32)
Calcium: 8.9 mg/dL (ref 8.9–10.3)
Chloride: 106 mmol/L (ref 98–111)
Creatinine, Ser: 0.83 mg/dL (ref 0.44–1.00)
GFR, Estimated: >60 mL/min (ref >60)
Glucose, Bld: 83 mg/dL (ref 70–99)
Potassium: 4.3 mmol/L (ref 3.5–5.1)
Sodium: 137 mmol/L (ref 135–145)

## 2021-07-06 LAB — RESP PANEL BY RT-PCR (FLU A&B, COVID) ARPGX2
Influenza A by PCR: NEGATIVE
Influenza B by PCR: NEGATIVE
SARS Coronavirus 2 by RT PCR: NEGATIVE

## 2021-07-06 LAB — MAGNESIUM: Magnesium: 1.9 mg/dL (ref 1.7–2.4)

## 2021-07-06 MED ORDER — ONDANSETRON HCL 4 MG/2ML IJ SOLN: 4.0000 mg | Freq: Four times a day (QID) | INTRAMUSCULAR | Status: DC | PRN

## 2021-07-06 MED ORDER — HYDROCODONE-ACETAMINOPHEN 5-325 MG PO TABS
1.0000 | ORAL_TABLET | Freq: Four times a day (QID) | ORAL | Status: DC | PRN
  Administered 2021-07-06: 1 via ORAL
  Filled 2021-07-06: qty 1

## 2021-07-06 MED ORDER — ESCITALOPRAM OXALATE 5 MG PO TABS
2.5000 mg | ORAL_TABLET | Freq: Every day | ORAL | Status: DC
  Administered 2021-07-07 – 2021-07-11 (×5): 2.5 mg via ORAL
  Filled 2021-07-06 (×5): qty 1

## 2021-07-06 MED ORDER — ACETAMINOPHEN 325 MG PO TABS
650.0000 mg | ORAL_TABLET | Freq: Four times a day (QID) | ORAL | Status: DC | PRN
  Administered 2021-07-09 – 2021-07-11 (×2): 650 mg via ORAL
  Filled 2021-07-06 (×4): qty 2

## 2021-07-06 MED ORDER — DOCUSATE SODIUM 100 MG PO CAPS
100.0000 mg | ORAL_CAPSULE | Freq: Two times a day (BID) | ORAL | Status: DC
  Administered 2021-07-07 – 2021-07-10 (×5): 100 mg via ORAL
  Filled 2021-07-06 (×11): qty 1

## 2021-07-06 MED ORDER — ACETAMINOPHEN 650 MG RE SUPP: 650.0000 mg | Freq: Four times a day (QID) | RECTAL | Status: DC | PRN

## 2021-07-06 MED ORDER — HYDROCODONE-ACETAMINOPHEN 5-325 MG PO TABS
2.0000 | ORAL_TABLET | Freq: Once | ORAL | Status: AC
Start: 2021-07-06 — End: 2021-07-06
  Administered 2021-07-06: 2 via ORAL
  Filled 2021-07-06: qty 2

## 2021-07-06 MED ORDER — POLYETHYLENE GLYCOL 3350 17 G PO PACK
17.0000 g | PACK | Freq: Every day | ORAL | Status: DC | PRN
  Administered 2021-07-09 – 2021-07-11 (×2): 17 g via ORAL
  Filled 2021-07-06 (×4): qty 1

## 2021-07-06 MED ORDER — HYDROCODONE-ACETAMINOPHEN 10-325 MG PO TABS
1.0000 | ORAL_TABLET | Freq: Four times a day (QID) | ORAL | Status: DC | PRN
Start: 2021-07-06 — End: 2021-07-11
  Administered 2021-07-07 – 2021-07-11 (×14): 1 via ORAL
  Filled 2021-07-06 (×15): qty 1

## 2021-07-06 MED ORDER — LORAZEPAM 1 MG PO TABS
0.5000 mg | ORAL_TABLET | Freq: Once | ORAL | Status: AC
  Administered 2021-07-06: 0.5 mg via ORAL
  Filled 2021-07-06: qty 1

## 2021-07-06 MED ORDER — FENTANYL CITRATE PF 50 MCG/ML IJ SOSY: 50.0000 ug | PREFILLED_SYRINGE | INTRAMUSCULAR | Status: DC | PRN

## 2021-07-06 MED ORDER — LEVOTHYROXINE SODIUM 88 MCG PO TABS
88.0000 ug | ORAL_TABLET | Freq: Every day | ORAL | Status: DC
  Administered 2021-07-07 – 2021-07-11 (×4): 88 ug via ORAL
  Filled 2021-07-06 (×4): qty 1

## 2021-07-06 MED ORDER — OXYCODONE-ACETAMINOPHEN 5-325 MG PO TABS: 1.0000 | ORAL_TABLET | Freq: Four times a day (QID) | ORAL | Status: DC | PRN

## 2021-07-06 NOTE — ED Notes (Signed)
Attempted report, NS took name and number to return call for report.

## 2021-07-06 NOTE — H&P (Signed)
History and Physical    PLEASE NOTE THAT DRAGON DICTATION SOFTWARE WAS USED IN THE CONSTRUCTION OF THIS NOTE.   Amy Weiss ZOX:[id number] DOB: [birth year] DOA: 07/06/2021  PCP: [name], MD Patient coming from: home   I have personally briefly reviewed patient's old medical records in [facility]  Chief Complaint: Back pain  HPI: Amy Weiss is a 86 y.o. female with medical history significant for vertebral compression fractures, dementia, bradycardia, type 2 diabetes mellitus, acquired hypothyroidism, who is admitted to [facility] on 07/06/2021 with acute T7 compression fracture after presenting from home to [facility] ED complaining of back pain.   In the context of the patient's advanced dementia and associated limited ability to contribute to the history, the following history is provided via my discussions with the patient's daughter (POA), who is present at bedside, in addition to my discussions with the EDP, and via chart review.  The patient presents with new onset midline back discomfort in the mid thoracic region, which started acutely earlier this afternoon.  At the time of onset of this discomfort, the patient was leaning forward to adjust her phone charger at the level of the wall outlet, and upon standing while also rotating the upper torso after completing this activity, the patient developed significant midline mid thoracic discomfort in the absence of any preceding or ensuing fall or trauma.  Subsequently, this midline thoracic back pain has been constant, worsening with ambulation and with movement of the upper extremities, with the patient describing the discomfort as sharp and nonradiating in nature.  The patient lives at home, and while the above mechanism of injury was not directly visualized by the patient's daughter in real-time, the patient's daughter reports that she was able to review the footage from her home security cameras, and confirming the kinesthetic  features occurring at the time of the patient's onset of thoracic back discomfort.  Not associated with any acute focal weakness, numbness, or paresthesias.  However, in the setting of the degree of pain associated with acute midthoracic back discomfort, the patient reports that she is unable to ambulate relative to her baseline ambulatory status which she reportedly is able to transfer and ambulate without assistance.  Of note, patient has a history of multiple prior thoracic and lumbar vertebral body compression fractures, leading to an apparent total of 5 kyphoplasties since 2021, during which time she has undergone 3 kyphoplasty's to the thoracic spine as well as 2 kyphoplasty's to the lumbar spine.  Most recently, she was admitted to [facility] from 05/30/2021 to 06/03/2021 for acute vertebral compression fracture of L3, with associated acute pathology to L3 occurring via reported similar mechanism of action in which the patient was rising from a seated to standing position while undergoing a rotational component of motion involving the upper torso.  At that time, she presented with complaint of acute lumbar back pain, with MRI lumbar spine revealing acute L3 vertebral body compression fracture.  She was admitted to the hospital service, and interventional radiology was consulted, with Dr.Deveshwar Performing balloon kyphoplasty of L3 on 06/01/2021.  Per review of associated discharge summary, there was reportedly some difficulty in achieving adequate pain control postoperatively, but ultimately found to have adequate pain control on as needed Norco, which reportedly provided the patient with sufficient pain control that she was able to transfer and ambulate without assistance while experiencing an acceptable level of residual lumbar discomfort.  Subsequently, on 06/03/2021, the patient was discharged back to  home on Norco 5/325 mg p.o. twice daily as needed.  She denies any interval trauma or fall.  The patient  notes mild residual discomfort in her lumbar spine, which, overall, she conveys is no worse than the degree of severity of associated discomfort that she was experiencing at the time of her discharge from the hospital on 06/03/2021.  She denies any acute weakness involving the bilateral lower extremities and also denies any acute paresthesias or numbness, including no saddle anesthesia.  Denies any recent urinary retention or fecal incontinence.  Denies any subjective fever, chills, rigors, or generalized myalgias. Denies any recent headache, neck stiffness, rhinitis, rhinorrhea, sore throat, sob, wheezing, cough, nausea, vomiting, abdominal pain, diarrhea, or rash. No recent traveling or known COVID-19 exposures. Denies dysuria, gross hematuria, or change in urinary urgency/frequency.  Denies any recent chest pain, diaphoresis, or palpitations.  She is on a daily baby aspirin, as her sole outpatient blood thinning agent, with most recent such dose occurring on the morning of 07/06/2021.  She also has a documented history of chronic bradycardia with A-V block, Mobitz type I.  Not on any AV nodal blocking agents at home.  Per my discussions with daughter, baseline heart rate is in the high 30s to low 40s.  Past medical history also notable for acquired hypothyroidism for which the patient is on Synthroid, with most recent TSH found to be within normal limits when checked in November 2021.     ED Course:  Vital signs in the ED were notable for the following: Tetramex 98.2, heart rate 33-40, blood pressure initially noted to be 188/90, which is improved to 158/45 following interval administration of analgesic medication, as further detailed below; respiratory rate 15-20, oxygen saturation 96 to 99% on room air.  Labs were notable for the following: BMP notable for the following: Sodium 137, potassium 4.3, creatinine 0.83, glucose 83.  CBC notable for battleground 8000, hemoglobin 13.4, platelets 241.   Screening nasopharyngeal COVID-19 PCR was performed in the ED today and found to be negative.  MRI thoracic spine showed mild acute T7 vertebral body compression fracture with minimal marrow edema within the T7 vertebral body without significant height loss, also showing evidence of chronic T10 and T12 vertebral body compression fractures.  MRI of the lumbar spine showed acute to subacute L3 vertebral body compression fracture status post augmentation with moderate severity residual bone marrow edema and mild increase in vertebral body height loss compared with MRI on 05/30/2021, also demonstrating chronic L1, L2, and L4 vertebral body compression fractures.  The EDP discussed the patient's case with the on-call interventional radiologist, Dr. Corliss Skainseveshwar, Who recommended admission to the hospitalist service for further evaluation and management of acute T7 compression fracture.  Dr. Corliss Skainseveshwar to formally evaluate the patient tomorrow, at which time he plans to discuss with the patient and the patient's daughter management options for her acute T7 compression fracture, including conservative versus surgical possibilities.  While in the ED, the following were administered: Norco 5/325 mg p.o. x2 tabs, Ativan 0.5 mg p.o. x1.     Review of Systems: As per HPI otherwise 10 point review of systems negative.   Past Medical History:  Diagnosis Date   Diabetes mellitus 4/09   A1C-6   Familial tremor    Hyperlipidemia    Hypothyroidism    Osteoporosis    Tachycardia    AV Re-entry, s/p ablation aprox 2005   TIA (transient ischemic attack) 12/09    Past Surgical History:  Procedure Laterality  Date   ABDOMINAL HYSTERECTOMY     no oophorectomy   BLADDER SURGERY     x 2 in the 57s   BREAST BIOPSY     L (-)   IR KYPHO EA ADDL LEVEL THORACIC OR LUMBAR  12/29/2019   IR KYPHO LUMBAR INC FX REDUCE BONE BX UNI/BIL CANNULATION INC/IMAGING  01/06/2020   IR KYPHO LUMBAR INC FX REDUCE BONE BX UNI/BIL  CANNULATION INC/IMAGING  06/01/2021   IR KYPHO THORACIC WITH BONE BIOPSY  12/29/2019   IR KYPHO THORACIC WITH BONE BIOPSY  02/02/2020    Social History:  reports that she quit smoking about 9 years ago. Her smoking use included cigarettes. She has a 5.00 pack-year smoking history. She has never used smokeless tobacco. She reports that she does not currently use alcohol. She reports that she does not use drugs.   Allergies  Allergen Reactions   Sulfonamide Derivatives Other (See Comments)    Skin irritation    Family History  Problem Relation Age of Onset   Pancreatic cancer Brother    Pancreatic cancer Father    Coronary artery disease Mother 21   Breast cancer Neg Hx    Colon cancer Neg Hx     Family history reviewed and not pertinent    Prior to Admission medications   Medication Sig Start Date End Date Taking? Authorizing Provider  acetaminophen (TYLENOL) 500 MG tablet Take 500 mg by mouth 2 (two) times daily.   Yes [provider]  aspirin EC 81 MG tablet Take 81 mg by mouth every morning.   Yes [provider]  escitalopram (LEXAPRO) 5 MG tablet Take 0.5 tablets (2.5 mg total) by mouth daily. 06/23/21  Yes Paz, Nolon Rod, MD  HYDROcodone-acetaminophen (NORCO/VICODIN) 5-325 MG tablet Take 1 tablet by mouth 2 (two) times daily as needed for moderate pain or severe pain. 06/10/21  Yes Paz, Nolon Rod, MD  levothyroxine (SYNTHROID) 88 MCG tablet TAKE 1 TABLET BY MOUTH DAILY BEFORE BREAKFAST. Patient taking differently: Take 88 mcg by mouth daily before breakfast. 06/28/21  Yes Paz, Nolon Rod, MD  polyethylene glycol (MIRALAX / GLYCOLAX) 17 g packet Take 17 g by mouth daily as needed for moderate constipation (with each dose of Norco/hydrocodone).   Yes [provider]  diclofenac Sodium (VOLTAREN) 1 % GEL Apply 2 g topically 4 (four) times daily. Patient not taking: Reported on 07/06/2021 01/12/20   Almon Hercules, MD     Objective    Physical Exam: Vitals:    07/06/21 1213 07/06/21 1300 07/06/21 1646 07/06/21 1647  BP: (!) 188/90 (!) 188/50 (!) 158/45 (!) 158/45  Pulse: (!) 33 (!) 32  (!) 40  Resp: Temp: 98.2 F (36.8 C)     TempSrc: Oral     SpO2: 97% 99%  96%    General: appears to be stated age; alert; not oriented to person, place, time Skin: warm, dry, no rash Head:  AT/[place] Mouth:  Oral mucosa membranes appear moist, normal dentition Neck: supple; trachea midline Heart:  RRR; did not appreciate any M/R/G Lungs: CTAB, did not appreciate any wheezes, rales, or rhonchi Abdomen: + BS; soft, ND, NT Vascular: 2+ pedal pulses b/l; 2+ radial pulses b/l Extremities: no peripheral edema, no muscle wasting Neuro: strength and sensation intact in upper and lower extremities b/l   Labs on Admission: I have personally reviewed following labs and imaging studies  CBC: Recent Labs  Lab 07/06/21 1639  WBC 8.0  NEUTROABS 4.8  HGB 13.4  HCT 41.7  MCV 91.0  PLT 241   Basic Metabolic Panel: Recent Labs  Lab 07/06/21 1639  NA 137  K 4.3  CL 106  CO2 24  GLUCOSE 83  BUN 15  CREATININE 0.83  CALCIUM 8.9   GFR: CrCl cannot be calculated (Unknown ideal weight.). Liver Function Tests: No results for input(s): AST, ALT, ALKPHOS, BILITOT, PROT, ALBUMIN in the last 168 hours. No results for input(s): LIPASE, AMYLASE in the last 168 hours. No results for input(s): AMMONIA in the last 168 hours. Coagulation Profile: No results for input(s): INR, PROTIME in the last 168 hours. Cardiac Enzymes: No results for input(s): CKTOTAL, CKMB, CKMBINDEX, TROPONINI in the last 168 hours. BNP (last 3 results) No results for input(s): PROBNP in the last 8760 hours. HbA1C: No results for input(s): HGBA1C in the last 72 hours. CBG: No results for input(s): GLUCAP in the last 168 hours. Lipid Profile: No results for input(s): CHOL, HDL, LDLCALC, TRIG, CHOLHDL, LDLDIRECT in the last 72 hours. Thyroid Function Tests: No results for  input(s): TSH, T4TOTAL, FREET4, T3FREE, THYROIDAB in the last 72 hours. Anemia Panel: No results for input(s): VITAMINB12, FOLATE, FERRITIN, TIBC, IRON, RETICCTPCT in the last 72 hours. Urine analysis:    Component Value Date/Time   COLORURINE YELLOW 02/05/2020 0857   APPEARANCEUR CLEAR 02/05/2020 0857   LABSPEC 1.015 02/05/2020 0857   PHURINE 8.0 02/05/2020 0857   GLUCOSEU NEGATIVE 02/05/2020 0857   HGBUR NEGATIVE 02/05/2020 0857   BILIRUBINUR NEGATIVE 02/05/2020 0857   KETONESUR NEGATIVE 02/05/2020 0857   PROTEINUR 30 (A) 01/28/2020 0759   UROBILINOGEN 0.2 02/05/2020 0857   NITRITE NEGATIVE 02/05/2020 0857   LEUKOCYTESUR NEGATIVE 02/05/2020 0857    Radiological Exams on Admission: MR THORACIC SPINE WO CONTRAST  Result Date: 07/06/2021 CLINICAL DATA:  Mid back pain.  Increased pain while walking. EXAM: MRI THORACIC AND LUMBAR SPINE WITHOUT CONTRAST TECHNIQUE: Multiplanar and multiecho pulse sequences of the thoracic and lumbar spine were obtained without intravenous contrast. COMPARISON:  05/30/2021 FINDINGS: MRI THORACIC SPINE FINDINGS Alignment:  Physiologic. Vertebrae: No discitis or aggressive osseous lesion. Mild acute T7 vertebral body compression fracture with minimal marrow edema within the T7 vertebral body without significant height loss. Chronic T10 vertebral body compression fracture with 50% height loss status post vertebral body augmentation. Chronic T12 vertebral body compression fracture with approximately 30% height loss status post vertebral body augmentation. Cord:  Normal signal and morphology. Paraspinal and other soft tissues: Negative. Disc levels: No significant thoracic spine disc protrusion, foraminal stenosis or central canal stenosis. MRI LUMBAR SPINE FINDINGS Segmentation:  Standard. Alignment:  Physiologic. Vertebrae: No discitis or osteomyelitis. Chronic L1 vertebral body compression fracture with approximately 70% anterior height loss and 3 mm of retropulsion  of the superior posterior margin of the L1 vertebral body status post prior augmentation. Chronic L2 vertebral body compression fracture with 20% height loss status post prior augmentation. L3 vertebral body compression fracture status post augmentation with moderate severity residual bone marrow edema and mild increased vertebral body height loss compared with the prior examination of 05/30/2021. Chronic L4 vertebral body compression fracture Conus medullaris and cauda equina: Conus extends to the T12 level. Conus and cauda equina appear normal. Paraspinal and other soft tissues: No acute paraspinal abnormality. Disc levels: Degenerative disease with disc height loss at L4-5. Mild disc height loss at L5-S1. IMPRESSION: MR THORACIC SPINE IMPRESSION 1. Mild acute T7 vertebral body compression fracture with minimal marrow edema within the T7  vertebral body without significant height loss. 2. Chronic T10 and T12 vertebral body compression fractures. MR LUMBAR SPINE IMPRESSION 1. Acute-subacute L3 vertebral body compression fracture status post augmentation with moderate severity residual bone marrow edema and mild increased vertebral body height loss compared with the prior examination of 05/30/2021. 2. Chronic L1, L2 and L4 vertebral body compression fractures. Electronically Signed   By: Elige Ko M.D.   On: 07/06/2021 15:06   MR LUMBAR SPINE WO CONTRAST  Result Date: 07/06/2021 CLINICAL DATA:  Mid back pain.  Increased pain while walking. EXAM: MRI THORACIC AND LUMBAR SPINE WITHOUT CONTRAST TECHNIQUE: Multiplanar and multiecho pulse sequences of the thoracic and lumbar spine were obtained without intravenous contrast. COMPARISON:  05/30/2021 FINDINGS: MRI THORACIC SPINE FINDINGS Alignment:  Physiologic. Vertebrae: No discitis or aggressive osseous lesion. Mild acute T7 vertebral body compression fracture with minimal marrow edema within the T7 vertebral body without significant height loss. Chronic T10  vertebral body compression fracture with 50% height loss status post vertebral body augmentation. Chronic T12 vertebral body compression fracture with approximately 30% height loss status post vertebral body augmentation. Cord:  Normal signal and morphology. Paraspinal and other soft tissues: Negative. Disc levels: No significant thoracic spine disc protrusion, foraminal stenosis or central canal stenosis. MRI LUMBAR SPINE FINDINGS Segmentation:  Standard. Alignment:  Physiologic. Vertebrae: No discitis or osteomyelitis. Chronic L1 vertebral body compression fracture with approximately 70% anterior height loss and 3 mm of retropulsion of the superior posterior margin of the L1 vertebral body status post prior augmentation. Chronic L2 vertebral body compression fracture with 20% height loss status post prior augmentation. L3 vertebral body compression fracture status post augmentation with moderate severity residual bone marrow edema and mild increased vertebral body height loss compared with the prior examination of 05/30/2021. Chronic L4 vertebral body compression fracture Conus medullaris and cauda equina: Conus extends to the T12 level. Conus and cauda equina appear normal. Paraspinal and other soft tissues: No acute paraspinal abnormality. Disc levels: Degenerative disease with disc height loss at L4-5. Mild disc height loss at L5-S1. IMPRESSION: MR THORACIC SPINE IMPRESSION 1. Mild acute T7 vertebral body compression fracture with minimal marrow edema within the T7 vertebral body without significant height loss. 2. Chronic T10 and T12 vertebral body compression fractures. MR LUMBAR SPINE IMPRESSION 1. Acute-subacute L3 vertebral body compression fracture status post augmentation with moderate severity residual bone marrow edema and mild increased vertebral body height loss compared with the prior examination of 05/30/2021. 2. Chronic L1, L2 and L4 vertebral body compression fractures. Electronically Signed    By: Elige Ko M.D.   On: 07/06/2021 15:06      Assessment/Plan   Amy Weiss is a 86 y.o. female with medical history significant for vertebral compression fractures, dementia, bradycardia, type 2 diabetes mellitus, acquired hypothyroidism, who is admitted to [facility] on 07/06/2021 with acute T7 compression fracture after presenting from home to [facility] ED complaining of back pain.    Principal Problem:   Compression fracture of T7 vertebra (HCC) Active Problems:   Bradycardia   Back pain   Hypothyroid   DM2 (diabetes mellitus, type 2) (HCC)      #) Acute compression fracture of T7 vertebra: Diagnosis on the basis of acute midthoracic back discomfort starting earlier today, with ensuing MRI of the thoracic spine demonstrating evidence of mild acute T7 vertebral body compression fracture with minimal marrow edema within the T7 vertebral body without significant associated height loss.  This occurred in the absence  of overt trauma, as further described above.  Due to current degree of suboptimal pain control, patient currently unable to ambulate on the basis of poor pain tolerance relative to her baseline abilities in which she is able to ambulate and transfer without assistance.  No evidence of associated acute focal neurologic deficits.  Case was discussed with the on-call interventional radiologist, Dr. Corliss Skains, who recommended admission to the hospital service for further evaluation management of acute T7 compression fracture, and plans to evaluate the patient in person in the morning at which time options for medical versus surgical intervention will be discussed.  Per my discussions with the patient as well as her daughter, the patient would prefer to achieve pain control in the absence of IV analgesia, and the daughter is requested the avoidance of oxycodone given reported altered mental status that the patient experienced while taking this medication on a as needed basis during  the most recent prior hospitalization.  Within these confines, the option of increasing her home dose of Norco was discussed with both the patient as well as her daughter, and they convey that they are amenable to this approach.   Plan: Interventional radiology consulted, as above, including for ensuing discussions regarding medical versus surgical intervention for her acute compression fracture.. Norco 10/325 mg po Q6H prn , as above.  Fall precautions.  Start scheduled Colace 100 mg p.o. twice daily.  As needed MiraLAX.  Add on INR.  Add on 25-hydroxy vitamin D level.       #) Subacute L3 compression fracture: Recent hospitalization for acute L3 compression fracture status post balloon kyphoplasty on 06/01/2021, as further detailed above.  The patient conveys that her associated discomfort has continued to improve following discharge to home on 06/03/2021, and that her reason for presenting back to the emergency department today was acute back pain in the thoracic distribution.  Today's presentation is not associate with any red flag symptoms, including no evidence of saddle anesthesia, urinary retention or fecal incontinence.  Plan: As needed analgesia via Norco, as above.  IR consulted, as above.      #) Type 2 diabetes mellitus: Documented history of such, with the patient managing via lifestyle modifications, and most recent hemoglobin A1c noted to be 5.7% when checked in November 2021.  Specifically, she is on no insulin or oral hypoglycemic agents at home.   Plan: repeat BMP in the AM.  Given the degree of her glycemic control as an outpatient in the absence of any pharmacologic intervention, will refrain from scheduled Accu-Cheks for now.       #) Acquired hypothyroidism: Documented history of such, on Synthroid as an outpatient.  It appears that most recent TSH check occurred in November 2021, at which time this level was found to be within normal limits.   Plan: Check TSH given  presenting bradycardia.        #) Chronic bradycardia: Documented history of chronic bradycardia in the setting of second-degree AV block Mobitz type I.  Presenting heart rates found to be in the high 30s to low 40s, which the patient's daughter confirms represents baseline for this patient.  She appears asymptomatic with these heart rates, and hemodynamically stable.  I discussed with the patient's daughter the option of monitoring her heart rate and rhythm on telemetry overnight, however, following these discussions, the patient's daughter requested that overnight telemetry monitoring not be pursued.  Of note, she is not on any AV nodal blocking agents as an outpatient.  Plan:  will refrain from telemetry monitoring per instruction of power of attorney, as above.  Check TSH in the context of a documented history of acquired hypothyroidism on Synthroid supplementation.  Add on serum magnesium level.       DVT prophylaxis: scd's  Code Status: DNR/DNI (confirmed per my discussions with the POA this evening) Family Communication: Case was discussed with the patient's daughter (POA), who was present at bedside;  Disposition Plan: Per Rounding Team Consults called: Dr. Corliss Skains of IR consulted, as further detailed above;   Admission status: inpatient;      Of note, this patient was added by me to the following Admit List/Treatment Team: [email or web address].      PLEASE NOTE THAT DRAGON DICTATION SOFTWARE WAS USED IN THE CONSTRUCTION OF THIS NOTE.   Angie Fava DO [facility] Pager [phone] From 6PM - 2AM  Otherwise, please contact night-coverage  [email or web address] Password [facility]   07/06/2021, 6:25 PM

## 2021-07-06 NOTE — ED Provider Notes (Addendum)
[facility] EMERGENCY DEPARTMENT Provider Note   CSN: [id number] Arrival date & time: 07/06/21  1158     History Chief Complaint  Patient presents with   Back Pain    Amy Weiss is a 86 y.o. female.  HPI Patient is 86 year old female with past medical history detailed below notably has a history of spontaneous vertebral compression fractures.  Is relatively active at home and is able to live by herself is able to get around to do her activities of daily living.  Amy Weiss was last admitted for compression fracture slightly over 1 month ago.  Amy Weiss denies any numbness or weakness no bowel or bladder incontinence or saddle anesthesia.  Amy Weiss has a history also of sinus bradycardia.  Amy Weiss denies any lightheadedness or dizziness states that Amy Weiss was walking around her house today when Amy Weiss felt a pop between her shoulder blades and also developed low back pain.  Amy Weiss states Amy Weiss sat down to see if the pain would improve however did not.  Amy Weiss states Amy Weiss came to the ER for worsening 10/10 pain.  Denies any radiation of the pain down either leg.  Amy Weiss denies any fevers or chills.  No history of IV drug use.  No other associate symptoms.  No aggravating mitigating factors apart from worse with any movement at all.  States that Amy Weiss is having trouble walking.       Past Medical History:  Diagnosis Date   Diabetes mellitus 4/09   A1C-6   Familial tremor    Hyperlipidemia    Hypothyroidism    Osteoporosis    Tachycardia    AV Re-entry, s/p ablation aprox 2005   TIA (transient ischemic attack) 12/09    Patient Active Problem List   Diagnosis Date Noted   Compression fracture of lumbar vertebra, closed, initial encounter (HCC) 05/30/2021   Malnutrition of moderate degree 01/31/2020   Closed wedge compression fracture of T10 vertebra (HCC) 01/28/2020   Weight loss 01/28/2020   Abnormal LFTs 01/28/2020   Weakness generalized    DNR (do not resuscitate)    Cognitive changes     Senile dementia uncomp, without behavioral disturbance (HCC) 01/06/2020   Hypothyroid 01/06/2020   Back pain 01/02/2020   Palliative care by specialist    Encounter for hospice care discussion    Bradycardia 12/24/2019   Lumbar compression fracture, closed, initial encounter (HCC) 12/24/2019   Lumbar compression fracture (HCC) 12/23/2019   Acute bilateral low back pain without sciatica 11/26/2019   PCP NOTES >>>> 10/18/2015   Atrioventricular block, Mobitz type 1, Wenckebach 12/10/2014   Eczema 12/29/2011   Annual physical exam 05/09/2011   SKIN LESION 05/06/2010   Hyperglycemia 11/19/2008   TIA 11/19/2008   Hyperlipidemia 03/04/2008   FAMILIAL TREMOR 03/04/2008   Arrhythmia--h/o AV reentry tachycardia 03/04/2008   ARTHRALGIA 03/04/2008   Osteoporosis 03/04/2008   Closed compression fracture of L2 lumbar vertebra, initial encounter (HCC) 04/05/2007    Past Surgical History:  Procedure Laterality Date   ABDOMINAL HYSTERECTOMY     no oophorectomy   BLADDER SURGERY     x 2 in the 80s   BREAST BIOPSY     L (-)   IR KYPHO EA ADDL LEVEL THORACIC OR LUMBAR  12/29/2019   IR KYPHO LUMBAR INC FX REDUCE BONE BX UNI/BIL CANNULATION INC/IMAGING  01/06/2020   IR KYPHO LUMBAR INC FX REDUCE BONE BX UNI/BIL CANNULATION INC/IMAGING  06/01/2021   IR KYPHO THORACIC WITH BONE BIOPSY  12/29/2019  IR KYPHO THORACIC WITH BONE BIOPSY  02/02/2020     OB History   No obstetric history on file.     Family History  Problem Relation Age of Onset   Pancreatic cancer Brother    Pancreatic cancer Father    Coronary artery disease Mother [id number]   Breast cancer Neg Hx    Colon cancer Neg Hx     Social History   Tobacco Use   Smoking status: Former    Packs/day: 0.50    Years: 10.00    Pack years: 5.00    Types: Cigarettes    Quit date: 06/20/2012    Years since quitting: 9.0   Smokeless tobacco: Never  Substance Use Topics   Alcohol use: Not Currently    Comment: beer rarely   Drug use: No     Home Medications Prior to Admission medications   Medication Sig Start Date End Date Taking? Authorizing Provider  acetaminophen (TYLENOL) 500 MG tablet Take 500 mg by mouth 2 (two) times daily.   Yes [provider]  aspirin EC 81 MG tablet Take 81 mg by mouth every morning.   Yes [provider]  escitalopram (LEXAPRO) 5 MG tablet Take 0.5 tablets (2.5 mg total) by mouth daily. 06/23/21  Yes Paz, Nolon RodJose E, MD  HYDROcodone-acetaminophen (NORCO/VICODIN) 5-325 MG tablet Take 1 tablet by mouth 2 (two) times daily as needed for moderate pain or severe pain. 06/10/21  Yes Paz, Nolon RodJose E, MD  levothyroxine (SYNTHROID) 88 MCG tablet TAKE 1 TABLET BY MOUTH DAILY BEFORE BREAKFAST. Patient taking differently: Take 88 mcg by mouth daily before breakfast. 06/28/21  Yes Paz, Nolon RodJose E, MD  polyethylene glycol (MIRALAX / GLYCOLAX) 17 g packet Take 17 g by mouth daily as needed for moderate constipation (with each dose of Norco/hydrocodone).   Yes [provider]  diclofenac Sodium (VOLTAREN) 1 % GEL Apply 2 g topically 4 (four) times daily. Patient not taking: Reported on 07/06/2021 01/12/20   Almon HerculesGonfa, Taye T, MD    Allergies    Sulfonamide derivatives  Review of Systems   Review of Systems  Constitutional:  Negative for chills and fever.  HENT:  Negative for congestion, ear pain, postnasal drip, rhinorrhea, sinus pain and sore throat.   Eyes:  Negative for pain and redness.  Respiratory:  Negative for cough and shortness of breath.   Cardiovascular:  Negative for chest pain and leg swelling.  Gastrointestinal:  Negative for abdominal distention, abdominal pain and vomiting.  Genitourinary:  Negative for dysuria.  Musculoskeletal:  Positive for back pain. Negative for myalgias and neck pain.  Skin:  Negative for rash.  Neurological:  Negative for dizziness and headaches.  Psychiatric/Behavioral:  Negative for sleep disturbance.    Physical Exam Updated Vital Signs BP (!) 158/45    Pulse (!) 40   Temp 98.2 F (36.8 C) (Oral)   Resp 18   SpO2 96%   Physical Exam Vitals and nursing note reviewed.  Constitutional:      General: Amy Weiss is not in acute distress. HENT:     Head: Normocephalic and atraumatic.     Nose: Nose normal.     Mouth/Throat:     Mouth: Mucous membranes are moist.  Eyes:     General: No scleral icterus. Cardiovascular:     Rate and Rhythm: Normal rate and regular rhythm.     Pulses: Normal pulses.     Heart sounds: Normal heart sounds.  Pulmonary:     Effort: Pulmonary  effort is normal. No respiratory distress.     Breath sounds: No wheezing.  Abdominal:     Palpations: Abdomen is soft.     Tenderness: There is no abdominal tenderness.  Musculoskeletal:     Cervical back: Normal range of motion.     Right lower leg: No edema.     Left lower leg: No edema.     Comments: Diffuse midline tenderness palpation of the thoracic spine.  No focal bony tenderness.  There is also tenderness palpation of the lumbar spine at approximately L2-L3 area  Skin:    General: Skin is warm and dry.     Capillary Refill: Capillary refill takes less than 2 seconds.  Neurological:     Mental Status: Amy Weiss is alert. Mental status is at baseline.     Comments: Moves all 4 extremities.  Strength appropriate all 4 extremities.  Sensation intact in all 4 extremities.  Psychiatric:        Mood and Affect: Mood normal.        Behavior: Behavior normal.    ED Results / Procedures / Treatments   Labs (all labs ordered are listed, but only abnormal results are displayed) Labs Reviewed  RESP PANEL BY RT-PCR (FLU A&B, COVID) ARPGX2  BASIC METABOLIC PANEL  CBC WITH DIFFERENTIAL/PLATELET    EKG None  Radiology MR THORACIC SPINE WO CONTRAST  Result Date: 07/06/2021 CLINICAL DATA:  Mid back pain.  Increased pain while walking. EXAM: MRI THORACIC AND LUMBAR SPINE WITHOUT CONTRAST TECHNIQUE: Multiplanar and multiecho pulse sequences of the thoracic and lumbar spine  were obtained without intravenous contrast. COMPARISON:  05/30/2021 FINDINGS: MRI THORACIC SPINE FINDINGS Alignment:  Physiologic. Vertebrae: No discitis or aggressive osseous lesion. Mild acute T7 vertebral body compression fracture with minimal marrow edema within the T7 vertebral body without significant height loss. Chronic T10 vertebral body compression fracture with 50% height loss status post vertebral body augmentation. Chronic T12 vertebral body compression fracture with approximately 30% height loss status post vertebral body augmentation. Cord:  Normal signal and morphology. Paraspinal and other soft tissues: Negative. Disc levels: No significant thoracic spine disc protrusion, foraminal stenosis or central canal stenosis. MRI LUMBAR SPINE FINDINGS Segmentation:  Standard. Alignment:  Physiologic. Vertebrae: No discitis or osteomyelitis. Chronic L1 vertebral body compression fracture with approximately 70% anterior height loss and 3 mm of retropulsion of the superior posterior margin of the L1 vertebral body status post prior augmentation. Chronic L2 vertebral body compression fracture with 20% height loss status post prior augmentation. L3 vertebral body compression fracture status post augmentation with moderate severity residual bone marrow edema and mild increased vertebral body height loss compared with the prior examination of 05/30/2021. Chronic L4 vertebral body compression fracture Conus medullaris and cauda equina: Conus extends to the T12 level. Conus and cauda equina appear normal. Paraspinal and other soft tissues: No acute paraspinal abnormality. Disc levels: Degenerative disease with disc height loss at L4-5. Mild disc height loss at L5-S1. IMPRESSION: MR THORACIC SPINE IMPRESSION 1. Mild acute T7 vertebral body compression fracture with minimal marrow edema within the T7 vertebral body without significant height loss. 2. Chronic T10 and T12 vertebral body compression fractures. MR LUMBAR  SPINE IMPRESSION 1. Acute-subacute L3 vertebral body compression fracture status post augmentation with moderate severity residual bone marrow edema and mild increased vertebral body height loss compared with the prior examination of 05/30/2021. 2. Chronic L1, L2 and L4 vertebral body compression fractures. Electronically Signed   By: Dillard Cannon.D.  On: 07/06/2021 15:06   MR LUMBAR SPINE WO CONTRAST  Result Date: 07/06/2021 CLINICAL DATA:  Mid back pain.  Increased pain while walking. EXAM: MRI THORACIC AND LUMBAR SPINE WITHOUT CONTRAST TECHNIQUE: Multiplanar and multiecho pulse sequences of the thoracic and lumbar spine were obtained without intravenous contrast. COMPARISON:  05/30/2021 FINDINGS: MRI THORACIC SPINE FINDINGS Alignment:  Physiologic. Vertebrae: No discitis or aggressive osseous lesion. Mild acute T7 vertebral body compression fracture with minimal marrow edema within the T7 vertebral body without significant height loss. Chronic T10 vertebral body compression fracture with 50% height loss status post vertebral body augmentation. Chronic T12 vertebral body compression fracture with approximately 30% height loss status post vertebral body augmentation. Cord:  Normal signal and morphology. Paraspinal and other soft tissues: Negative. Disc levels: No significant thoracic spine disc protrusion, foraminal stenosis or central canal stenosis. MRI LUMBAR SPINE FINDINGS Segmentation:  Standard. Alignment:  Physiologic. Vertebrae: No discitis or osteomyelitis. Chronic L1 vertebral body compression fracture with approximately 70% anterior height loss and 3 mm of retropulsion of the superior posterior margin of the L1 vertebral body status post prior augmentation. Chronic L2 vertebral body compression fracture with 20% height loss status post prior augmentation. L3 vertebral body compression fracture status post augmentation with moderate severity residual bone marrow edema and mild increased vertebral  body height loss compared with the prior examination of 05/30/2021. Chronic L4 vertebral body compression fracture Conus medullaris and cauda equina: Conus extends to the T12 level. Conus and cauda equina appear normal. Paraspinal and other soft tissues: No acute paraspinal abnormality. Disc levels: Degenerative disease with disc height loss at L4-5. Mild disc height loss at L5-S1. IMPRESSION: MR THORACIC SPINE IMPRESSION 1. Mild acute T7 vertebral body compression fracture with minimal marrow edema within the T7 vertebral body without significant height loss. 2. Chronic T10 and T12 vertebral body compression fractures. MR LUMBAR SPINE IMPRESSION 1. Acute-subacute L3 vertebral body compression fracture status post augmentation with moderate severity residual bone marrow edema and mild increased vertebral body height loss compared with the prior examination of 05/30/2021. 2. Chronic L1, L2 and L4 vertebral body compression fractures. Electronically Signed   By: Elige Ko M.D.   On: 07/06/2021 15:06    Procedures Procedures   Medications Ordered in ED Medications  fentaNYL (SUBLIMAZE) injection 50 mcg (has no administration in time range)  HYDROcodone-acetaminophen (NORCO/VICODIN) 5-325 MG per tablet 2 tablet (2 tablets Oral Given 07/06/21 1309)  LORazepam (ATIVAN) tablet 0.5 mg (0.5 mg Oral Given 07/06/21 1309)    ED Course  I have reviewed the triage vital signs and the nursing notes.  Pertinent labs & imaging results that were available during my care of the patient were reviewed by me and considered in my medical decision making (see chart for details).  Clinical Course as of 07/06/21 1758  Wed Jul 06, 2021  1623 Discussed with Dr. Lawson Fiscal [WF]    Clinical Course User Index [WF] Gailen Shelter, [place]   MDM Rules/Calculators/A&P                           Patient is a 86 year old female with past medical history detailed in HPI has had multiple spontaneous vertebral fractures in the  past.  Most recently just over 1 month ago when Amy Weiss received kyphoplasty from Dr. Clayborn Bigness of IR  Amy Weiss states that her pain is severe achy constant 10/10.  Amy Weiss was given 2 doses of Norco here in the ER MRI of  lumbar and thoracic spine to evaluate for compression fracture  Discussed with Dr. Clayborn Bigness who states that kyphoplasty is not unreasonable however given how the height change is it would be reasonable to reevaluate in the future.  I discussed this case with my attending physician who cosigned this note including patient's presenting symptoms, physical exam, and planned diagnostics and interventions. Attending physician stated agreement with plan or made changes to plan which were implemented.   Attending physician assessed patient at bedside.   I personally reviewed all laboratory work and imaging.  Metabolic panel without any acute abnormality specifically kidney function within normal limits and no significant electrolyte abnormalities. CBC without leukocytosis or significant anemia.   Attempted to ambulate patient without success.  I use walker however patient was not even able to stand up without severe pain.  Will admit patient to hospital for pain control secondary to her vertebral fractures x2.  Final Clinical Impression(s) / ED Diagnoses Final diagnoses:  Compression fracture of T7 vertebra, initial encounter (HCC)  Compression fracture of L3 vertebra, initial encounter [facility])    Rx / DC Orders ED Discharge Orders     None        Gailen Shelter, [place] 07/06/21 1758    Gailen Shelter, [place] 07/06/21 Mitzie Na, MD 07/09/21 [phone]

## 2021-07-06 NOTE — Plan of Care (Signed)
New admission. Pt c/o back pain 8/10 dull aching pain. Pt and daughter refusing IV, heart monitor, VS taken while sleeping. Provider has been informed of pt and daughters wishes.   Problem: Education: Goal: Knowledge of General Education information will improve Description: Including pain rating scale, medication(s)/side effects and non-pharmacologic comfort measures Outcome: Not Progressing   Problem: Clinical Measurements: Goal: Ability to maintain clinical measurements within normal limits will improve Outcome: Not Progressing Goal: Will remain free from infection Outcome: Not Progressing Goal: Diagnostic test results will improve Outcome: Not Progressing Goal: Respiratory complications will improve Outcome: Not Progressing Goal: Cardiovascular complication will be avoided Outcome: Not Progressing   Problem: Activity: Goal: Risk for activity intolerance will decrease Outcome: Not Progressing   Problem: Nutrition: Goal: Adequate nutrition will be maintained Outcome: Not Progressing   Problem: Coping: Goal: Level of anxiety will decrease Outcome: Not Progressing   Problem: Elimination: Goal: Will not experience complications related to bowel motility Outcome: Not Progressing Goal: Will not experience complications related to urinary retention Outcome: Not Progressing   Problem: Pain Managment: Goal: General experience of comfort will improve Outcome: Not Progressing   Problem: Safety: Goal: Ability to remain free from injury will improve Outcome: Not Progressing   Problem: Skin Integrity: Goal: Risk for impaired skin integrity will decrease Outcome: Not Progressing

## 2021-07-06 NOTE — ED Triage Notes (Signed)
Pt [name] for increased back pain after pop in the back while walking. Pt has hx of back surgery and back pain. Pt A&O x3. Hx of dementia. 7/10 back pain at this time.

## 2021-07-06 NOTE — ED Notes (Signed)
Patient family member reports patient ambulated in room and came to door because she felt like her brief was wet. Patient family member assisted patient back to bed and reported this to the RN for documentation that the patient is able to ambulate.

## 2021-07-06 NOTE — Progress Notes (Signed)
   Pt is active with [facility], the home-based palliative care division of [facility].  Plan will be to resume services upon return back home.  Please call [facility] if we can be of assistance with d/c plan.  Thank you Julius Bowels, RN/CHPN Office # [phone] Mobile # [phone]

## 2021-07-06 NOTE — Telephone Encounter (Signed)
Sorry to hear, thank you

## 2021-07-06 NOTE — ED Notes (Signed)
Pt returned from MRI, reapplied monitoring devices and plan of care discussed with patient and PA Wylder.

## 2021-07-06 NOTE — Telephone Encounter (Signed)
FYI

## 2021-07-06 NOTE — ED Notes (Signed)
Called floor, new room assigned. New RN will call for report.

## 2021-07-06 NOTE — Telephone Encounter (Signed)
Daughter  Alinda Sierras called wanting to make Dr. Drue Novel aware that pt has been sent to [facility] by ambulance on today with a possible back fracture.  States pt felt a crack and could not get up at all.

## 2021-07-07 ENCOUNTER — Encounter (HOSPITAL_COMMUNITY): Payer: Self-pay | Admitting: Internal Medicine

## 2021-07-07 DIAGNOSIS — E119 Type 2 diabetes mellitus without complications: Secondary | ICD-10-CM

## 2021-07-07 LAB — CBC
HCT: 37.3 % (ref 36.0–46.0)
Hemoglobin: 12.4 g/dL (ref 12.0–15.0)
MCH: 29.2 pg (ref 26.0–34.0)
MCHC: 33.2 g/dL (ref 30.0–36.0)
MCV: 87.8 fL (ref 80.0–100.0)
Platelets: 226 10*3/uL (ref 150–400)
RBC: 4.25 MIL/uL (ref 3.87–5.11)
RDW: 15.3 % (ref 11.5–15.5)
WBC: 7.2 10*3/uL (ref 4.0–10.5)
nRBC: 0 % (ref 0.0–0.2)

## 2021-07-07 LAB — COMPREHENSIVE METABOLIC PANEL
ALT: 9 U/L (ref 0–44)
AST: 18 U/L (ref 15–41)
Albumin: 2.9 g/dL — ABNORMAL LOW (ref 3.5–5.0)
Alkaline Phosphatase: 67 U/L (ref 38–126)
Anion gap: 9 (ref 5–15)
BUN: 13 mg/dL (ref 8–23)
CO2: 25 mmol/L (ref 22–32)
Calcium: 9.1 mg/dL (ref 8.9–10.3)
Chloride: 103 mmol/L (ref 98–111)
Creatinine, Ser: 0.66 mg/dL (ref 0.44–1.00)
GFR, Estimated: >60 mL/min (ref >60)
Glucose, Bld: 103 mg/dL — ABNORMAL HIGH (ref 70–99)
Potassium: 3.9 mmol/L (ref 3.5–5.1)
Sodium: 137 mmol/L (ref 135–145)
Total Bilirubin: 0.6 mg/dL (ref 0.3–1.2)
Total Protein: 6.1 g/dL — ABNORMAL LOW (ref 6.5–8.1)

## 2021-07-07 LAB — MAGNESIUM: Magnesium: 1.9 mg/dL (ref 1.7–2.4)

## 2021-07-07 LAB — PROTIME-INR
INR: 1 (ref 0.8–1.2)
Prothrombin Time: 13.2 seconds (ref 11.4–15.2)

## 2021-07-07 LAB — VITAMIN D 25 HYDROXY (VIT D DEFICIENCY, FRACTURES): Vit D, 25-Hydroxy: 22.89 ng/mL — ABNORMAL LOW (ref 30–100)

## 2021-07-07 MED ORDER — VITAMIN D (ERGOCALCIFEROL) 1.25 MG (50000 UNIT) PO CAPS
50000.0000 [IU] | ORAL_CAPSULE | ORAL | Status: DC
  Administered 2021-07-07: 50000 [IU] via ORAL
  Filled 2021-07-07: qty 1

## 2021-07-07 MED ORDER — AMLODIPINE BESYLATE 5 MG PO TABS
5.0000 mg | ORAL_TABLET | Freq: Every day | ORAL | Status: DC
  Administered 2021-07-07 – 2021-07-11 (×4): 5 mg via ORAL
  Filled 2021-07-07 (×5): qty 1

## 2021-07-07 MED ORDER — LABETALOL HCL 5 MG/ML IV SOLN
10.0000 mg | INTRAVENOUS | Status: DC | PRN
  Administered 2021-07-08: 10 mg via INTRAVENOUS
  Filled 2021-07-07 (×2): qty 4

## 2021-07-07 NOTE — Progress Notes (Addendum)
VAST consult received to place 18g IV in [facility] for procedure stat. VAST RN unable to reach unit RN who placed consult.  VAST RN called IR and requested information about patient's need for an IV for tomorrow 07/08/21. Advised patient needs a functional IV (any size) in hand or forearm only d/t patient being prone for procecdure. IV is to be used for sedation.  SecureChat sent to patient's nurse, Melissa. Awaiting return call.  Spoke with patient's nurse, Efraim Kaufmann. She placed consult based on information she received from PA earlier today. Informed Melissa of conversation with IR and advised it would be best to wait until immediately before procedure to place IV dt patient's confusion and likelihood to pull IV out. Melissa, RN agreed to pass information along to night shift. Assessed patient's arms bilaterally utilizing [place]. Patient with small vessels. Marked  each arm for possible IV placement in am in either lateral forearm (left or right). Hand IV may also be placed.

## 2021-07-07 NOTE — Plan of Care (Signed)
Patient is alert oriented x 4, but has moments of confusion. Pt denies pain to back. Bed pad and top sheet noted on floor. Pt also noted with out gown on but under blankets. Pt offered new gown pt refused. Pt noted to be agitated when asked about pain or speaking with patient regarding care. Pt informed of order for NPO after midnight and future test. Pt stated no one would be doing anything for her tomorrow. Pt stated she would like to be left alone to rest.  Bed alarm on, bed in low position. Lights out, door closed.   Problem: Education: Goal: Knowledge of General Education information will improve Description: Including pain rating scale, medication(s)/side effects and non-pharmacologic comfort measures Outcome: Progressing   Problem: Health Behavior/Discharge Planning: Goal: Ability to manage health-related needs will improve Outcome: Progressing   Problem: Clinical Measurements: Goal: Ability to maintain clinical measurements within normal limits will improve Outcome: Progressing Goal: Will remain free from infection Outcome: Progressing Goal: Diagnostic test results will improve Outcome: Progressing Goal: Respiratory complications will improve Outcome: Progressing Goal: Cardiovascular complication will be avoided Outcome: Progressing   Problem: Activity: Goal: Risk for activity intolerance will decrease Outcome: Progressing   Problem: Nutrition: Goal: Adequate nutrition will be maintained Outcome: Progressing   Problem: Coping: Goal: Level of anxiety will decrease Outcome: Progressing   Problem: Elimination: Goal: Will not experience complications related to bowel motility Outcome: Progressing Goal: Will not experience complications related to urinary retention Outcome: Progressing   Problem: Pain Managment: Goal: General experience of comfort will improve Outcome: Progressing   Problem: Safety: Goal: Ability to remain free from injury will improve Outcome:  Progressing   Problem: Skin Integrity: Goal: Risk for impaired skin integrity will decrease Outcome: Progressing

## 2021-07-07 NOTE — Progress Notes (Signed)
PROGRESS NOTE    Amy Weiss  WUJ:[id number] DOB: 1935-08-13 DOA: 07/06/2021 PCP: [name], MD   Chief Complain: Back pain  Brief Narrative:  Patient is 86 year old female with history of multiple compression fractures, dementia, chronic bradycardia, type 2 diabetes, hypothyroidism who presented from home with back pain.  Patient has history of multiple prior thoracic/lumbar vertebral body compression fractures s/p kyphoplasty  x3.  Last admission was from 7/11-7/15 for acute vertebral compression fracture of L3.  She was subsequently discharged back to home. She again presented with back pain.  MRI thoracic/lumbar spine done at this time showed mild acute T7 vertebral body compression fracture with minimal marrow edema within the T7 vertebral body without significant height loss.Also showed chronic T10 and T12 vertebral body compression fractures,acute-subacute L3 vertebral body compression fracture status post augmentation with moderate severity residual bone marrow edema and mild increased vertebral body height loss compared with the prior examination,chronic L1, L2 and L4 vertebral body compression fractures. IR again consulted for possible need of kyphoplasty.  Also noted to have deficient in vitamin D, started on supplementation.   Assessment & Plan:   Principal Problem:   Compression fracture of T7 vertebra (HCC) Active Problems:   Bradycardia   Back pain   Hypothyroid   DM2 (diabetes mellitus, type 2) (HCC)   Acute compression fracture of T7 vertebra: Has history of multiple compression fracture, status post kyphoplasty x3.No H/O recent trauma. Continue pain management, supportive care.  IR has been consulted for possible need of kyphoplasty for T7 vertebra.  PT/OT will be consulted after decision from IR. Should be careful with the use of narcotics in this elderly female .  Patient has history of altered mental status with narcotics.  Continue bowel  regimen.  Hypertension: Patient remains hypertensive.  Continue as needed medications for severe hypertension.  Could have been triggered by pain.  She is not on any medications at home.  Started on 5 mg amlodipine.  Vitamin D deficiency: Vitamin D of 22.  Started on high-dose supplementation  Diabetes type 2: Most recent hemoglobin A1c of 5.7.  Not  any medications at home.  Diet controlled.  Monitor blood sugars  Hypothyroidism: Continue Synthroid at current dose  Chronic bradycardia: Has history of secondary AV block Mobitz type I.  Patient has bradycardia at baseline.  She is asymptomatic.  Monitor on telemetry.  Debility/deconditioning :PT/OT consultation will be done when appropriate           DVT prophylaxis:SCD Code Status: DNR/DNI Family Communication: Called both daughters on phone today,calls not received Status is: Inpatient  Remains inpatient appropriate because:Inpatient level of care appropriate due to severity of illness  Dispo: The patient is from: Home              Anticipated d/c is to: Home vs SNF              Patient currently is not medically stable to d/c.   Difficult to place patient No      Consultants: IR  Procedures:None yet  Antimicrobials:  Anti-infectives (From admission, onward)    None       Subjective:  Patient seen and examined the bedside this morning.  Hemodynamically stable during my evaluation.  Overall appears comfortable.  Eating her breakfast.  She is confused at baseline. C/O back pain   Objective: Vitals:   07/06/21 1647 07/06/21 2138 07/06/21 2209 07/07/21 0500  BP: (!) 158/45 (!) 145/62 (!) 193/49   Pulse: (!) 40 Marland Kitchen)  44 (!) 41   Resp: 18 17 20    Temp:  98 F (36.7 C) 98.1 F (36.7 C)   TempSrc:  Oral Oral   SpO2: 96% 97% 99%   Weight:    68.4 kg   No intake or output data in the 24 hours ending 07/07/21 0725 Filed Weights   07/07/21 0500  Weight: 68.4 kg    Examination:  General exam: Overall  comfortable, not in distress.  Pleasantly confused, deconditioned HEENT: PERRL Respiratory system:  no wheezes or crackles  Cardiovascular system: S1 & S2 heard, RRR.  Gastrointestinal system: Abdomen is nondistended, soft and nontender. Central nervous system: Alert and awake but not oriented Extremities: No edema, no clubbing ,no cyanosis Skin: No rashes, no ulcers,no icterus      Data Reviewed: I have personally reviewed following labs and imaging studies  CBC: Recent Labs  Lab 07/06/21 1639 07/07/21 0257  WBC 8.0 7.2  NEUTROABS 4.8  --   HGB 13.4 12.4  HCT 41.7 37.3  MCV 91.0 87.8  PLT 241 226   Basic Metabolic Panel: Recent Labs  Lab 07/06/21 1639 07/06/21 2231 07/07/21 0257  NA 137  --  137  K 4.3  --  3.9  CL 106  --  103  CO2 24  --  25  GLUCOSE 83  --  103*  BUN 15  --  13  CREATININE 0.83  --  0.66  CALCIUM 8.9  --  9.1  MG  --  1.9 1.9   GFR: Estimated Creatinine Clearance: 51.8 mL/min (by C-G formula based on SCr of 0.66 mg/dL). Liver Function Tests: Recent Labs  Lab 07/07/21 0257  AST 18  ALT 9  ALKPHOS 67  BILITOT 0.6  PROT 6.1*  ALBUMIN 2.9*   No results for input(s): LIPASE, AMYLASE in the last 168 hours. No results for input(s): AMMONIA in the last 168 hours. Coagulation Profile: Recent Labs  Lab 07/07/21 0257  INR 1.0   Cardiac Enzymes: No results for input(s): CKTOTAL, CKMB, CKMBINDEX, TROPONINI in the last 168 hours. BNP (last 3 results) No results for input(s): PROBNP in the last 8760 hours. HbA1C: No results for input(s): HGBA1C in the last 72 hours. CBG: No results for input(s): GLUCAP in the last 168 hours. Lipid Profile: No results for input(s): CHOL, HDL, LDLCALC, TRIG, CHOLHDL, LDLDIRECT in the last 72 hours. Thyroid Function Tests: No results for input(s): TSH, T4TOTAL, FREET4, T3FREE, THYROIDAB in the last 72 hours. Anemia Panel: No results for input(s): VITAMINB12, FOLATE, FERRITIN, TIBC, IRON, RETICCTPCT in the  last 72 hours. Sepsis Labs: No results for input(s): PROCALCITON, LATICACIDVEN in the last 168 hours.  Recent Results (from the past 240 hour(s))  Resp Panel by RT-PCR (Flu A&B, Covid) Nasopharyngeal Swab     Status: None   Collection Time: 07/06/21  5:02 PM   Specimen: Nasopharyngeal Swab; Nasopharyngeal(NP) swabs in vial transport medium  Result Value Ref Range Status   SARS Coronavirus 2 by RT PCR NEGATIVE NEGATIVE Final    Comment: (NOTE) SARS-CoV-2 target nucleic acids are NOT DETECTED.  The SARS-CoV-2 RNA is generally detectable in upper respiratory specimens during the acute phase of infection. The lowest concentration of SARS-CoV-2 viral copies this assay can detect is 138 copies/mL. A negative result does not preclude SARS-Cov-2 infection and should not be used as the sole basis for treatment or other patient management decisions. A negative result may occur with  improper specimen collection/handling, submission of specimen other than nasopharyngeal swab,  presence of viral mutation(s) within the areas targeted by this assay, and inadequate number of viral copies(<138 copies/mL). A negative result must be combined with clinical observations, patient history, and epidemiological information. The expected result is Negative.  Fact Sheet for Patients:  BloggerCourse.com  Fact Sheet for Healthcare Providers:  SeriousBroker.it  This test is no t yet approved or cleared by the [place] FDA and  has been authorized for detection and/or diagnosis of SARS-CoV-2 by FDA under an Emergency Use Authorization (EUA). This EUA will remain  in effect (meaning this test can be used) for the duration of the COVID-19 declaration under Section 564(b)(1) of the Act, 21 U.S.C.section 360bbb-3(b)(1), unless the authorization is terminated  or revoked sooner.       Influenza A by PCR NEGATIVE NEGATIVE Final   Influenza B by PCR NEGATIVE  NEGATIVE Final    Comment: (NOTE) The Xpert Xpress SARS-CoV-2/FLU/RSV plus assay is intended as an aid in the diagnosis of influenza from Nasopharyngeal swab specimens and should not be used as a sole basis for treatment. Nasal washings and aspirates are unacceptable for Xpert Xpress SARS-CoV-2/FLU/RSV testing.  Fact Sheet for Patients: BloggerCourse.com  Fact Sheet for Healthcare Providers: SeriousBroker.it  This test is not yet approved or cleared by the [place] FDA and has been authorized for detection and/or diagnosis of SARS-CoV-2 by FDA under an Emergency Use Authorization (EUA). This EUA will remain in effect (meaning this test can be used) for the duration of the COVID-19 declaration under Section 564(b)(1) of the Act, 21 U.S.C. section 360bbb-3(b)(1), unless the authorization is terminated or revoked.  Performed at [facility], [address] [place], [place] [address]          Radiology Studies: MR THORACIC SPINE WO CONTRAST  Result Date: 07/06/2021 CLINICAL DATA:  Mid back pain.  Increased pain while walking. EXAM: MRI THORACIC AND LUMBAR SPINE WITHOUT CONTRAST TECHNIQUE: Multiplanar and multiecho pulse sequences of the thoracic and lumbar spine were obtained without intravenous contrast. COMPARISON:  05/30/2021 FINDINGS: MRI THORACIC SPINE FINDINGS Alignment:  Physiologic. Vertebrae: No discitis or aggressive osseous lesion. Mild acute T7 vertebral body compression fracture with minimal marrow edema within the T7 vertebral body without significant height loss. Chronic T10 vertebral body compression fracture with 50% height loss status post vertebral body augmentation. Chronic T12 vertebral body compression fracture with approximately 30% height loss status post vertebral body augmentation. Cord:  Normal signal and morphology. Paraspinal and other soft tissues: Negative. Disc levels: No significant thoracic  spine disc protrusion, foraminal stenosis or central canal stenosis. MRI LUMBAR SPINE FINDINGS Segmentation:  Standard. Alignment:  Physiologic. Vertebrae: No discitis or osteomyelitis. Chronic L1 vertebral body compression fracture with approximately 70% anterior height loss and 3 mm of retropulsion of the superior posterior margin of the L1 vertebral body status post prior augmentation. Chronic L2 vertebral body compression fracture with 20% height loss status post prior augmentation. L3 vertebral body compression fracture status post augmentation with moderate severity residual bone marrow edema and mild increased vertebral body height loss compared with the prior examination of 05/30/2021. Chronic L4 vertebral body compression fracture Conus medullaris and cauda equina: Conus extends to the T12 level. Conus and cauda equina appear normal. Paraspinal and other soft tissues: No acute paraspinal abnormality. Disc levels: Degenerative disease with disc height loss at L4-5. Mild disc height loss at L5-S1. IMPRESSION: MR THORACIC SPINE IMPRESSION 1. Mild acute T7 vertebral body compression fracture with minimal marrow edema within the T7 vertebral body  without significant height loss. 2. Chronic T10 and T12 vertebral body compression fractures. MR LUMBAR SPINE IMPRESSION 1. Acute-subacute L3 vertebral body compression fracture status post augmentation with moderate severity residual bone marrow edema and mild increased vertebral body height loss compared with the prior examination of 05/30/2021. 2. Chronic L1, L2 and L4 vertebral body compression fractures. Electronically Signed   By: Elige KoHetal  Patel M.D.   On: 07/06/2021 15:06   MR LUMBAR SPINE WO CONTRAST  Result Date: 07/06/2021 CLINICAL DATA:  Mid back pain.  Increased pain while walking. EXAM: MRI THORACIC AND LUMBAR SPINE WITHOUT CONTRAST TECHNIQUE: Multiplanar and multiecho pulse sequences of the thoracic and lumbar spine were obtained without intravenous  contrast. COMPARISON:  05/30/2021 FINDINGS: MRI THORACIC SPINE FINDINGS Alignment:  Physiologic. Vertebrae: No discitis or aggressive osseous lesion. Mild acute T7 vertebral body compression fracture with minimal marrow edema within the T7 vertebral body without significant height loss. Chronic T10 vertebral body compression fracture with 50% height loss status post vertebral body augmentation. Chronic T12 vertebral body compression fracture with approximately 30% height loss status post vertebral body augmentation. Cord:  Normal signal and morphology. Paraspinal and other soft tissues: Negative. Disc levels: No significant thoracic spine disc protrusion, foraminal stenosis or central canal stenosis. MRI LUMBAR SPINE FINDINGS Segmentation:  Standard. Alignment:  Physiologic. Vertebrae: No discitis or osteomyelitis. Chronic L1 vertebral body compression fracture with approximately 70% anterior height loss and 3 mm of retropulsion of the superior posterior margin of the L1 vertebral body status post prior augmentation. Chronic L2 vertebral body compression fracture with 20% height loss status post prior augmentation. L3 vertebral body compression fracture status post augmentation with moderate severity residual bone marrow edema and mild increased vertebral body height loss compared with the prior examination of 05/30/2021. Chronic L4 vertebral body compression fracture Conus medullaris and cauda equina: Conus extends to the T12 level. Conus and cauda equina appear normal. Paraspinal and other soft tissues: No acute paraspinal abnormality. Disc levels: Degenerative disease with disc height loss at L4-5. Mild disc height loss at L5-S1. IMPRESSION: MR THORACIC SPINE IMPRESSION 1. Mild acute T7 vertebral body compression fracture with minimal marrow edema within the T7 vertebral body without significant height loss. 2. Chronic T10 and T12 vertebral body compression fractures. MR LUMBAR SPINE IMPRESSION 1. Acute-subacute  L3 vertebral body compression fracture status post augmentation with moderate severity residual bone marrow edema and mild increased vertebral body height loss compared with the prior examination of 05/30/2021. 2. Chronic L1, L2 and L4 vertebral body compression fractures. Electronically Signed   By: Elige KoHetal  Patel M.D.   On: 07/06/2021 15:06        Scheduled Meds:  docusate sodium  100 mg Oral BID   escitalopram  2.5 mg Oral Daily   levothyroxine  88 mcg Oral Q0600   Vitamin D (Ergocalciferol)  50,000 Units Oral Q7 days   Continuous Infusions:   LOS: 1 day    Time spent: 35 mins.More than 50% of that time was spent in counseling and/or coordination of care.      Burnadette PopAmrit Kaiyden Simkin, MD [facility] P8/18/2022, 7:25 AM

## 2021-07-07 NOTE — Plan of Care (Signed)

## 2021-07-07 NOTE — Consult Note (Signed)
Chief Complaint: Patient was seen in consultation today for T7 compression fracture  Referring Physician(s): Dr. Newton PiggJustin Howerter  Supervising Physician: Amy Weiss, Amy Weiss  [facility]  History of Present Illness: Amy Weiss is a 86 y.o. female known to NIR from prior vertebroplasty/kyphoplasties at T12 and L1 (12/30/19), L2 (01/07/20), T10 (02/02/20), and L3 (06/02/21) who [facility] ED 07/06/21 with acute onset back pain and decreased mobility at home.  Patient does have advanced dementia and her HCPOA is her daughter, Amy Weiss.   MR Thoracic and Lumbar Spine showed: MR THORACIC SPINE IMPRESSION   1. Mild acute T7 vertebral body compression fracture with minimal marrow edema within the T7 vertebral body without significant height loss. 2. Chronic T10 and T12 vertebral body compression fractures.   MR LUMBAR SPINE IMPRESSION   1. Acute-subacute L3 vertebral body compression fracture status post augmentation with moderate severity residual bone marrow edema and mild increased vertebral body height loss compared with the prior examination of 05/30/2021. 2. Chronic L1, L2 and L4 vertebral body compression fractures.  IR consulted for possible vertebroplasty/kyphoplasty of new compression fractures.   Patient assessed at bedside alongside Dr. Corliss Weiss.  She is sitting up in bed eating breakfast, agitated and grumpy this AM likely due to pain and underlying dementia.  She is overall cooperative with exam, however requires frequent reorientation and coaxing to comply. She initially refuses exam, however then agrees to exam and additional imaging if needed.  She requests discussion with [name].   Past Medical History:  Diagnosis Date   Diabetes mellitus 4/09   A1C-6   Familial tremor    Hyperlipidemia    Hypothyroidism    Osteoporosis    Tachycardia    AV Re-entry, s/p ablation aprox 2005   TIA (transient ischemic attack) 12/09     Past Surgical History:  Procedure Laterality Date   ABDOMINAL HYSTERECTOMY     no oophorectomy   BLADDER SURGERY     x 2 in the 80s   BREAST BIOPSY     L (-)   IR KYPHO EA ADDL LEVEL THORACIC OR LUMBAR  12/29/2019   IR KYPHO LUMBAR INC FX REDUCE BONE BX UNI/BIL CANNULATION INC/IMAGING  01/06/2020   IR KYPHO LUMBAR INC FX REDUCE BONE BX UNI/BIL CANNULATION INC/IMAGING  06/01/2021   IR KYPHO THORACIC WITH BONE BIOPSY  12/29/2019   IR KYPHO THORACIC WITH BONE BIOPSY  02/02/2020    Allergies: Ativan [lorazepam] and Sulfonamide derivatives  Medications: Prior to Admission medications   Medication Sig Start Date End Date Taking? Authorizing Provider  acetaminophen (TYLENOL) 500 MG tablet Take 500 mg by mouth 2 (two) times daily.   Yes [provider]  aspirin EC 81 MG tablet Take 81 mg by mouth every morning.   Yes [provider]  escitalopram (LEXAPRO) 5 MG tablet Take 0.5 tablets (2.5 mg total) by mouth daily. 06/23/21  Yes Paz, Nolon RodJose E, MD  HYDROcodone-acetaminophen (NORCO/VICODIN) 5-325 MG tablet Take 1 tablet by mouth 2 (two) times daily as needed for moderate pain or severe pain. 06/10/21  Yes Paz, Nolon RodJose E, MD  levothyroxine (SYNTHROID) 88 MCG tablet TAKE 1 TABLET BY MOUTH DAILY BEFORE BREAKFAST. Patient taking differently: Take 88 mcg by mouth daily before breakfast. 06/28/21  Yes Paz, Nolon RodJose E, MD  polyethylene glycol (MIRALAX / GLYCOLAX) 17 g packet Take 17 g by mouth daily as needed for moderate constipation (with each dose of Norco/hydrocodone).   Yes [provider]  diclofenac Sodium (VOLTAREN) 1 % GEL Apply 2 g topically 4 (four) times daily. 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, fully retired   Tobacco Use   Smoking status: Former    Packs/day: 0.50    Years: 10.00    Pack years: 5.00    Types: Cigarettes    Quit date: 06/20/2012    Years since quitting: 9.0   Smokeless tobacco: Never  Substance and Sexual Activity   Alcohol use: Not Currently    Comment: beer rarely   Drug use: No   Sexual activity: Never  Other Topics Concern   Not on file  Social History Narrative   Single, live by herself, good family support      lost a daughter in law   [name] lives close by   [name]   Ex- husband lives near   Social Determinants of Health   Financial Resource Strain: Not on file  Food Insecurity: Not on file  Transportation Needs: Not on file  Physical Activity: Not on file  Stress: Not on file  Social Connections: Not on file     Review of Systems: A 12 point ROS discussed and pertinent positives are indicated in the HPI above.  All other systems are negative.  Review of Systems  Unable to perform ROS: Dementia  Musculoskeletal:  Positive for back pain.   Vital Signs: BP (!) 157/74 (BP Location: Left Arm)   Pulse (!) 40   Temp 98.2 F (36.8 C) (Oral)   Resp 16   Wt 150 lb 12.7 oz (68.4 kg)   SpO2 99%   BMI 23.62 kg/m   Physical Exam Vitals and nursing note reviewed.  Constitutional:      General: She is not in acute distress.    Appearance: Normal appearance. She is not ill-appearing.  HENT:     Mouth/Throat:     Mouth: Mucous membranes are moist.     Pharynx: Oropharynx is clear.  Cardiovascular:     Rate and Rhythm: Normal rate and regular rhythm.  Pulmonary:     Effort: Pulmonary effort is normal. No respiratory distress.     Breath sounds: Normal breath sounds.  Abdominal:     General:  Abdomen is flat.     Palpations: Abdomen is soft.  Musculoskeletal:        General: Tenderness (mild tendernes with palpation in the mid/upper back which extends into the lower spine.  Exquisite/more severe tenderness to palpation at the lumbar spine. Pain with movement/rolling in bed.) present. Normal range of motion.  Skin:    General: Skin is warm and dry.  Neurological:     General: No focal deficit present.     Mental Status: She is alert. Mental status is at baseline. She is disoriented.  Psychiatric:     Comments: Patient with  history of dementia, agitated     MD Evaluation Airway: WNL Heart: WNL Abdomen: WNL Chest/ Lungs: WNL ASA  Classification: 3 Mallampati/Airway Score: One   Imaging: MR THORACIC SPINE WO CONTRAST  Result Date: 07/06/2021 CLINICAL DATA:  Mid back pain.  Increased pain while walking. EXAM: MRI THORACIC AND LUMBAR SPINE WITHOUT CONTRAST TECHNIQUE: Multiplanar and multiecho pulse sequences of the thoracic and lumbar spine were obtained without intravenous contrast. COMPARISON:  05/30/2021 FINDINGS: MRI THORACIC SPINE FINDINGS Alignment:  Physiologic. Vertebrae: No discitis or aggressive osseous lesion. Mild acute T7 vertebral body compression fracture with minimal marrow edema within the T7 vertebral body without significant height loss. Chronic T10 vertebral body compression fracture with 50% height loss status post vertebral body augmentation. Chronic T12 vertebral body compression fracture with approximately 30% height loss status post vertebral body augmentation. Cord:  Normal signal and morphology. Paraspinal and other soft tissues: Negative. Disc levels: No significant thoracic spine disc protrusion, foraminal stenosis or central canal stenosis. MRI LUMBAR SPINE FINDINGS Segmentation:  Standard. Alignment:  Physiologic. Vertebrae: No discitis or osteomyelitis. Chronic L1 vertebral body compression fracture with approximately 70% anterior height loss and 3 mm  of retropulsion of the superior posterior margin of the L1 vertebral body status post prior augmentation. Chronic L2 vertebral body compression fracture with 20% height loss status post prior augmentation. L3 vertebral body compression fracture status post augmentation with moderate severity residual bone marrow edema and mild increased vertebral body height loss compared with the prior examination of 05/30/2021. Chronic L4 vertebral body compression fracture Conus medullaris and cauda equina: Conus extends to the T12 level. Conus and cauda equina appear normal. Paraspinal and other soft tissues: No acute paraspinal abnormality. Disc levels: Degenerative disease with disc height loss at L4-5. Mild disc height loss at L5-S1. IMPRESSION: MR THORACIC SPINE IMPRESSION 1. Mild acute T7 vertebral body compression fracture with minimal marrow edema within the T7 vertebral body without significant height loss. 2. Chronic T10 and T12 vertebral body compression fractures. MR LUMBAR SPINE IMPRESSION 1. Acute-subacute L3 vertebral body compression fracture status post augmentation with moderate severity residual bone marrow edema and mild increased vertebral body height loss compared with the prior examination of 05/30/2021. 2. Chronic L1, L2 and L4 vertebral body compression fractures. Electronically Signed   By: Elige Ko M.D.   On: 07/06/2021 15:06   MR LUMBAR SPINE WO CONTRAST  Result Date: 07/06/2021 CLINICAL DATA:  Mid back pain.  Increased pain while walking. EXAM: MRI THORACIC AND LUMBAR SPINE WITHOUT CONTRAST TECHNIQUE: Multiplanar and multiecho pulse sequences of the thoracic and lumbar spine were obtained without intravenous contrast. COMPARISON:  05/30/2021 FINDINGS: MRI THORACIC SPINE FINDINGS Alignment:  Physiologic. Vertebrae: No discitis or aggressive osseous lesion. Mild acute T7 vertebral body compression fracture with minimal marrow edema within the T7 vertebral body without significant height loss.  Chronic T10 vertebral body compression fracture with 50% height loss status post vertebral body augmentation. Chronic T12 vertebral body compression fracture with approximately 30% height loss status post vertebral body augmentation. Cord:  Normal signal and morphology. Paraspinal and other soft tissues: Negative. Disc levels: No significant thoracic spine disc protrusion, foraminal stenosis or central canal stenosis. MRI LUMBAR SPINE FINDINGS Segmentation:  Standard. Alignment:  Physiologic. Vertebrae: No discitis or osteomyelitis. Chronic L1 vertebral body compression fracture with approximately 70% anterior height loss and 3 mm of retropulsion of the superior posterior margin of the L1 vertebral body status post prior augmentation. Chronic L2 vertebral body compression fracture with 20% height loss  status post prior augmentation. L3 vertebral body compression fracture status post augmentation with moderate severity residual bone marrow edema and mild increased vertebral body height loss compared with the prior examination of 05/30/2021. Chronic L4 vertebral body compression fracture Conus medullaris and cauda equina: Conus extends to the T12 level. Conus and cauda equina appear normal. Paraspinal and other soft tissues: No acute paraspinal abnormality. Disc levels: Degenerative disease with disc height loss at L4-5. Mild disc height loss at L5-S1. IMPRESSION: MR THORACIC SPINE IMPRESSION 1. Mild acute T7 vertebral body compression fracture with minimal marrow edema within the T7 vertebral body without significant height loss. 2. Chronic T10 and T12 vertebral body compression fractures. MR LUMBAR SPINE IMPRESSION 1. Acute-subacute L3 vertebral body compression fracture status post augmentation with moderate severity residual bone marrow edema and mild increased vertebral body height loss compared with the prior examination of 05/30/2021. 2. Chronic L1, L2 and L4 vertebral body compression fractures. Electronically  Signed   By: Elige Ko M.D.   On: 07/06/2021 15:06    Labs:  CBC: Recent Labs    05/31/21 0201 06/01/21 0322 07/06/21 1639 07/07/21 0257  WBC 7.0 7.0 8.0 7.2  HGB 12.2 12.9 13.4 12.4  HCT 37.6 39.6 41.7 37.3  PLT 228 233 241 226    COAGS: Recent Labs    06/01/21 0322 07/07/21 0257  INR 1.0 1.0    BMP: Recent Labs    05/31/21 0201 06/01/21 0322 07/06/21 1639 07/07/21 0257  NA 137 137 137 137  K 4.3 4.3 4.3 3.9  CL 105 107 106 103  CO2 24 23 24 25   GLUCOSE 127* 103* 83 103*  BUN 22 13 15 13   CALCIUM 8.5* 8.8* 8.9 9.1  CREATININE 0.72 0.64 0.83 0.66  GFRNONAA >60 >60 >60 >60    LIVER FUNCTION TESTS: Recent Labs    10/11/20 1218 07/07/21 0257  BILITOT 0.6 0.6  AST 20 18  ALT 9 9  ALKPHOS 119* 67  PROT 6.9 6.1*  ALBUMIN 3.7 2.9*    TUMOR MARKERS: No results for input(s): AFPTM, CEA, CA199, CHROMGRNA in the last 8760 hours.  Assessment and Plan: Mild acute T7 vertebral body compression fracture Acute-subacute L3 vertebral body compression fracture s/p recent kyphoplasty 7/14 by Dr. 07/09/21 Patient demented and agitated this AM.  Ultimately cooperative with exam, but requiring frequent coaxing, reassurance, and reminding.  Upon assessment, she does have pain throughout the thoracic and lumbar spine which is most pronounced at the lumbar level likely related to residual acute-subacute fracture.  Patient requests discussion with 8/14, her daughter, prior to proceeding any further.  Due to dificulty with compliance/cooperation, Dr. Corliss Skains has discussed with Amy Weiss who will attempt to come to hospital this afternoon.  Patient not NPO this AM.  Have requested insurance approval process be initiated.  NIR following.   Thank you for this interesting consult.  I greatly enjoyed meeting Amy Weiss and look forward to participating in their care.  A copy of this report was sent to the requesting provider on this date.  Electronically Signed: [name], PA 07/07/2021, 11:39 AM   I spent a total of 40 Minutes    in face to face in clinical consultation, greater than 50% of which was counseling/coordinating care for L3 acute-subacute compression fracture.

## 2021-07-07 NOTE — Progress Notes (Signed)
Interventional Radiology Brief Note:  Dr. Estanislado Pandy met with patient and daughter at bedside to discuss L3 vertebroplasty.  This could potentially be done as early as tomorrow.   Daughter is agreeable.  Discussed with patient that she will need an IV started and that the plan is ultimately to provide relief of her difficult to manage, ongoing back pain.   Risks and benefits were discussed with the patient including, but not limited to education regarding the natural healing process of compression fractures without intervention, bleeding, infection, cement migration which may cause spinal cord damage, paralysis, pulmonary embolism or even death.  This interventional procedure involves the use of X-rays and because of the nature of the planned procedure, it is possible that we will have prolonged use of X-ray fluoroscopy.  Potential radiation risks to you include (but are not limited to) the following: - A slightly elevated risk for cancer  several years later in life. This risk is typically less than 0.5% percent. This risk is low in comparison to the normal incidence of human cancer, which is 33% for women and 50% for men according to the [employer]. - Radiation induced injury can include skin redness, resembling a rash, tissue breakdown / ulcers and hair loss (which can be temporary or permanent).   The likelihood of either of these occurring depends on the difficulty of the procedure and whether you are sensitive to radiation due to previous procedures, disease, or genetic conditions.   IF your procedure requires a prolonged use of radiation, you will be notified and given written instructions for further action.  It is your responsibility to monitor the irradiated area for the 2 weeks following the procedure and to notify your physician if you are concerned that you have suffered a radiation induced injury.    All of the patient's questions were answered, patient is agreeable to  proceed.  Consent signed and in chart.  Brynda Greathouse, MS RD PA-C

## 2021-07-08 ENCOUNTER — Inpatient Hospital Stay (HOSPITAL_COMMUNITY): Payer: Medicare Other

## 2021-07-08 DIAGNOSIS — S32039K Unspecified fracture of third lumbar vertebra, subsequent encounter for fracture with nonunion: Secondary | ICD-10-CM

## 2021-07-08 HISTORY — PX: IR VERTEBROPLASTY LUMBAR BX INC UNI/BIL INC/INJECT/IMAGING: IMG5516

## 2021-07-08 MED ORDER — BUPIVACAINE HCL (PF) 0.5 % IJ SOLN
INTRAMUSCULAR | Status: AC
  Filled 2021-07-08: qty 30

## 2021-07-08 MED ORDER — HYDRALAZINE HCL 25 MG PO TABS
25.0000 mg | ORAL_TABLET | Freq: Four times a day (QID) | ORAL | Status: DC | PRN
  Administered 2021-07-09 (×2): 25 mg via ORAL
  Filled 2021-07-08 (×2): qty 1

## 2021-07-08 MED ORDER — FENTANYL CITRATE (PF) 100 MCG/2ML IJ SOLN
INTRAMUSCULAR | Status: AC
  Filled 2021-07-08: qty 2

## 2021-07-08 MED ORDER — FENTANYL CITRATE (PF) 100 MCG/2ML IJ SOLN
INTRAMUSCULAR | Status: AC | PRN
  Administered 2021-07-08 (×3): 12.5 ug via INTRAVENOUS

## 2021-07-08 MED ORDER — MIDAZOLAM HCL 2 MG/2ML IJ SOLN
INTRAMUSCULAR | Status: AC | PRN
  Administered 2021-07-08 (×3): 0.5 mg via INTRAVENOUS

## 2021-07-08 MED ORDER — MIDAZOLAM HCL 2 MG/2ML IJ SOLN
INTRAMUSCULAR | Status: AC
  Filled 2021-07-08: qty 2

## 2021-07-08 MED ORDER — CEFAZOLIN SODIUM-DEXTROSE 2-4 GM/100ML-% IV SOLN
INTRAVENOUS | Status: AC
  Administered 2021-07-08: 2 g via INTRAVENOUS
  Filled 2021-07-08: qty 100

## 2021-07-08 MED ORDER — CEFAZOLIN SODIUM-DEXTROSE 2-4 GM/100ML-% IV SOLN: 2.0000 g | INTRAVENOUS | Status: AC

## 2021-07-08 MED ORDER — SODIUM CHLORIDE 0.9 % IV SOLN: INTRAVENOUS | Status: AC

## 2021-07-08 MED ORDER — GELATIN ABSORBABLE 12-7 MM EX MISC
CUTANEOUS | Status: AC
  Filled 2021-07-08: qty 1

## 2021-07-08 MED ORDER — IOHEXOL 240 MG/ML SOLN
50.0000 mL | Freq: Once | INTRAMUSCULAR | Status: AC | PRN
  Administered 2021-07-08: 10 mL

## 2021-07-08 MED ORDER — TOBRAMYCIN SULFATE 1.2 G IJ SOLR
INTRAMUSCULAR | Status: AC
  Filled 2021-07-08: qty 1.2

## 2021-07-08 NOTE — Procedures (Signed)
S/P L3 VP S.Jakota Manthei MD

## 2021-07-08 NOTE — Progress Notes (Signed)
IV removed by patient, MD made aware, MD ok with not replacing IV.

## 2021-07-08 NOTE — Discharge Instructions (Signed)
1. No stooping,or bending or lifting more than 10 lbs for 2 weeks. °2.Use walker to ambulate for 2 weeks. °3.RTC PRN 2 weeks ° °

## 2021-07-08 NOTE — Progress Notes (Signed)
Elevated BP noted (197/58), scheduled meds given at time of elevated BP including Norvasc and pain medication. PRN Labetalol held, will recheck BP in 30 minutes and give Labetalol at that time, if needed.

## 2021-07-08 NOTE — Plan of Care (Signed)
  Problem: Education: Goal: Knowledge of General Education information will improve Description: Including pain rating scale, medication(s)/side effects and non-pharmacologic comfort measures 07/08/2021 2005 by Merrilee Seashore, RN Outcome: Not Progressing 07/08/2021 1754 by Merrilee Seashore, RN Outcome: Not Progressing   Problem: Health Behavior/Discharge Planning: Goal: Ability to manage health-related needs will improve 07/08/2021 2005 by Merrilee Seashore, RN Outcome: Not Progressing 07/08/2021 1754 by Merrilee Seashore, RN Outcome: Not Progressing   Problem: Clinical Measurements: Goal: Ability to maintain clinical measurements within normal limits will improve 07/08/2021 2005 by Merrilee Seashore, RN Outcome: Not Progressing 07/08/2021 1754 by Merrilee Seashore, RN Outcome: Not Progressing Goal: Will remain free from infection 07/08/2021 2005 by Merrilee Seashore, RN Outcome: Not Progressing 07/08/2021 1754 by Merrilee Seashore, RN Outcome: Progressing Goal: Diagnostic test results will improve 07/08/2021 2005 by Merrilee Seashore, RN Outcome: Progressing 07/08/2021 1754 by Merrilee Seashore, RN Outcome: Progressing Goal: Respiratory complications will improve 07/08/2021 2005 by Merrilee Seashore, RN Outcome: Progressing 07/08/2021 1754 by Merrilee Seashore, RN Outcome: Progressing Goal: Cardiovascular complication will be avoided 07/08/2021 2005 by Merrilee Seashore, RN Outcome: Progressing 07/08/2021 1754 by Merrilee Seashore, RN Outcome: Progressing   Problem: Activity: Goal: Risk for activity intolerance will decrease 07/08/2021 2005 by Merrilee Seashore, RN Outcome: Not Progressing 07/08/2021 1754 by Merrilee Seashore, RN Outcome: Not Progressing   Problem: Nutrition: Goal: Adequate nutrition will be maintained 07/08/2021 2005 by Merrilee Seashore, RN Outcome: Not Progressing 07/08/2021 1754 by Merrilee Seashore, RN Outcome: Not Progressing   Problem: Coping: Goal: Level of anxiety  will decrease 07/08/2021 2005 by Merrilee Seashore, RN Outcome: Not Progressing 07/08/2021 1754 by Merrilee Seashore, RN Outcome: Not Progressing   Problem: Elimination: Goal: Will not experience complications related to bowel motility 07/08/2021 2005 by Merrilee Seashore, RN Outcome: Not Progressing 07/08/2021 1754 by Merrilee Seashore, RN Outcome: Not Progressing Goal: Will not experience complications related to urinary retention 07/08/2021 2005 by Merrilee Seashore, RN Outcome: Progressing 07/08/2021 1754 by Merrilee Seashore, RN Outcome: Not Progressing   Problem: Pain Managment: Goal: General experience of comfort will improve 07/08/2021 2005 by Merrilee Seashore, RN Outcome: Not Progressing 07/08/2021 1754 by Merrilee Seashore, RN Outcome: Not Progressing   Problem: Safety: Goal: Ability to remain free from injury will improve 07/08/2021 2005 by Merrilee Seashore, RN Outcome: Not Progressing 07/08/2021 1754 by Merrilee Seashore, RN Outcome: Not Progressing   Problem: Skin Integrity: Goal: Risk for impaired skin integrity will decrease 07/08/2021 2005 by Merrilee Seashore, RN Outcome: Not Progressing 07/08/2021 1754 by Merrilee Seashore, RN Outcome: Not Progressing

## 2021-07-08 NOTE — Progress Notes (Signed)
PROGRESS NOTE    Amy Weiss  LFY:[id number] DOB: Jul 05, 1935 DOA: 07/06/2021 PCP: [name], MD   Chief Complain: Back pain  Brief Narrative:  Patient is 86 year old female with history of multiple compression fractures, dementia, chronic bradycardia, type 2 diabetes, hypothyroidism who presented from home with back pain.  Patient has history of multiple prior thoracic/lumbar vertebral body compression fractures s/p kyphoplasty  x3.  Last admission was from 7/11-7/15 for acute vertebral compression fracture of L3.  She was subsequently discharged back to home. She again presented with back pain.  MRI thoracic/lumbar spine done at this time showed mild acute T7 vertebral body compression fracture with minimal marrow edema within the T7 vertebral body without significant height loss.Also showed chronic T10 and T12 vertebral body compression fractures,acute-subacute L3 vertebral body compression fracture status post augmentation with moderate severity residual bone marrow edema and mild increased vertebral body height loss compared with the prior examination,chronic L1, L2 and L4 vertebral body compression fractures.  IR again consulted for possible need of kyphoplasty.  Also noted to have vitamin D deficiency, started on supplementation.  07/08/2021: Patient underwent outpatient vertebroplasty today.   Assessment & Plan:   Principal Problem:   Compression fracture of T7 vertebra (HCC) Active Problems:   Bradycardia   Back pain   Hypothyroid   DM2 (diabetes mellitus, type 2) (HCC)   Acute compression fracture of T7 vertebra: Has history of multiple compression fracture, status post kyphoplasty x3.No H/O recent trauma. Continue pain management, supportive care.  IR has been consulted for possible need of kyphoplasty for T7 vertebra.  PT/OT will be consulted after decision from IR. Should be careful with the use of narcotics in this elderly female .  Patient has history of altered  mental status with narcotics.  Continue bowel regimen. 07/08/2021: Patient underwent L3 vertebroplasty today.  Hypertension: Patient remains hypertensive.  Continue as needed medications for severe hypertension.  Could have been triggered by pain.  She is not on any medications at home.  Started on 5 mg amlodipine. 07/08/2021: Blood pressure remains uncontrolled.  Start patient on hydralazine 25 Mg p.o. every 6 hourly as needed.  Optimize pain control.  Vitamin D deficiency: Vitamin D.  Started on high-dose supplementation  Diabetes type 2: Most recent hemoglobin A1c of 5.7.  Not  any medications at home.  Diet controlled.  Monitor blood sugars  Hypothyroidism: Continue Synthroid at current dose  Chronic bradycardia: Has history of secondary AV block Mobitz type I.  Patient has bradycardia at baseline.  She is asymptomatic.  Monitor on telemetry.  Debility/deconditioning :PT/OT consultation will be done when appropriate  DVT prophylaxis:SCD Code Status: DNR/DNI Family Communication: Called both daughters on phone today,calls not received Status is: Inpatient  Remains inpatient appropriate because:Inpatient level of care appropriate due to severity of illness  Dispo: The patient is from: Home              Anticipated d/c is to: Home vs SNF              Patient currently is not medically stable to d/c.   Difficult to place patient No    Consultants: IR  Procedures:None yet  Antimicrobials:  Anti-infectives (From admission, onward)    Start     Dose/Rate Route Frequency Ordered Stop   07/08/21 0917  tobramycin (NEBCIN) 1.2 g powder       Note to Pharmacy: Derrell Lolling, TRAVIS   : cabinet override      07/08/21 0917 07/08/21 2129  07/08/21 0800  ceFAZolin (ANCEF) IVPB 2g/100 mL premix        2 g 200 mL/hr over 30 Minutes Intravenous To [facility] 07/08/21 0737 07/08/21 0947       Subjective:  Patient seen and examined the bedside this morning.  Hemodynamically stable  during my evaluation.  Overall appears comfortable.  Eating her breakfast.  She is confused at baseline. C/O back pain   Objective: Vitals:   07/08/21 1208 07/08/21 1215 07/08/21 1300 07/08/21 1525  BP: (!) 197/58 (!) 197/58 (!) 187/62 111/78  Pulse:  (!) 41 (!) 41 (!) 33  Resp:   16 18  Temp:  98.2 F (36.8 C) 98.5 F (36.9 C) 98.6 F (37 C)  TempSrc:  Oral Oral Oral  SpO2:  99% 99% 96%  Weight:       No intake or output data in the 24 hours ending 07/08/21 1658 Filed Weights   07/07/21 0500  Weight: 68.4 kg    Examination:  General exam: Overall comfortable, not in distress.  Pleasantly confused, deconditioned HEENT: PERRL Respiratory system:  no wheezes or crackles  Cardiovascular system: S1 & S2 heard, RRR.  Gastrointestinal system: Abdomen is nondistended, soft and nontender. Central nervous system: Alert and awake but not oriented Extremities: No edema, no clubbing ,no cyanosis Skin: No rashes, no ulcers,no icterus      Data Reviewed: I have personally reviewed following labs and imaging studies  CBC: Recent Labs  Lab 07/06/21 1639 07/07/21 0257  WBC 8.0 7.2  NEUTROABS 4.8  --   HGB 13.4 12.4  HCT 41.7 37.3  MCV 91.0 87.8  PLT 241 226    Basic Metabolic Panel: Recent Labs  Lab 07/06/21 1639 07/06/21 2231 07/07/21 0257  NA 137  --  137  K 4.3  --  3.9  CL 106  --  103  CO2 24  --  25  GLUCOSE 83  --  103*  BUN 15  --  13  CREATININE 0.83  --  0.66  CALCIUM 8.9  --  9.1  MG  --  1.9 1.9    GFR: Estimated Creatinine Clearance: 51.8 mL/min (by C-G formula based on SCr of 0.66 mg/dL). Liver Function Tests: Recent Labs  Lab 07/07/21 0257  AST 18  ALT 9  ALKPHOS 67  BILITOT 0.6  PROT 6.1*  ALBUMIN 2.9*    No results for input(s): LIPASE, AMYLASE in the last 168 hours. No results for input(s): AMMONIA in the last 168 hours. Coagulation Profile: Recent Labs  Lab 07/07/21 0257  INR 1.0    Cardiac Enzymes: No results for input(s):  CKTOTAL, CKMB, CKMBINDEX, TROPONINI in the last 168 hours. BNP (last 3 results) No results for input(s): PROBNP in the last 8760 hours. HbA1C: No results for input(s): HGBA1C in the last 72 hours. CBG: No results for input(s): GLUCAP in the last 168 hours. Lipid Profile: No results for input(s): CHOL, HDL, LDLCALC, TRIG, CHOLHDL, LDLDIRECT in the last 72 hours. Thyroid Function Tests: No results for input(s): TSH, T4TOTAL, FREET4, T3FREE, THYROIDAB in the last 72 hours. Anemia Panel: No results for input(s): VITAMINB12, FOLATE, FERRITIN, TIBC, IRON, RETICCTPCT in the last 72 hours. Sepsis Labs: No results for input(s): PROCALCITON, LATICACIDVEN in the last 168 hours.  Recent Results (from the past 240 hour(s))  Resp Panel by RT-PCR (Flu A&B, Covid) Nasopharyngeal Swab     Status: None   Collection Time: 07/06/21  5:02 PM   Specimen: Nasopharyngeal Swab; Nasopharyngeal(NP) swabs in  vial transport medium  Result Value Ref Range Status   SARS Coronavirus 2 by RT PCR NEGATIVE NEGATIVE Final    Comment: (NOTE) SARS-CoV-2 target nucleic acids are NOT DETECTED.  The SARS-CoV-2 RNA is generally detectable in upper respiratory specimens during the acute phase of infection. The lowest concentration of SARS-CoV-2 viral copies this assay can detect is 138 copies/mL. A negative result does not preclude SARS-Cov-2 infection and should not be used as the sole basis for treatment or other patient management decisions. A negative result may occur with  improper specimen collection/handling, submission of specimen other than nasopharyngeal swab, presence of viral mutation(s) within the areas targeted by this assay, and inadequate number of viral copies(<138 copies/mL). A negative result must be combined with clinical observations, patient history, and epidemiological information. The expected result is Negative.  Fact Sheet for Patients:  BloggerCourse.com  Fact Sheet  for Healthcare Providers:  SeriousBroker.it  This test is no t yet approved or cleared by the [place] FDA and  has been authorized for detection and/or diagnosis of SARS-CoV-2 by FDA under an Emergency Use Authorization (EUA). This EUA will remain  in effect (meaning this test can be used) for the duration of the COVID-19 declaration under Section 564(b)(1) of the Act, 21 U.S.C.section 360bbb-3(b)(1), unless the authorization is terminated  or revoked sooner.       Influenza A by PCR NEGATIVE NEGATIVE Final   Influenza B by PCR NEGATIVE NEGATIVE Final    Comment: (NOTE) The Xpert Xpress SARS-CoV-2/FLU/RSV plus assay is intended as an aid in the diagnosis of influenza from Nasopharyngeal swab specimens and should not be used as a sole basis for treatment. Nasal washings and aspirates are unacceptable for Xpert Xpress SARS-CoV-2/FLU/RSV testing.  Fact Sheet for Patients: BloggerCourse.com  Fact Sheet for Healthcare Providers: SeriousBroker.it  This test is not yet approved or cleared by the [place] FDA and has been authorized for detection and/or diagnosis of SARS-CoV-2 by FDA under an Emergency Use Authorization (EUA). This EUA will remain in effect (meaning this test can be used) for the duration of the COVID-19 declaration under Section 564(b)(1) of the Act, 21 U.S.C. section 360bbb-3(b)(1), unless the authorization is terminated or revoked.  Performed at [facility], [address] [place], [place] [address]           Radiology Studies: No results found.      Scheduled Meds:  amLODipine  5 mg Oral Daily   bupivacaine       docusate sodium  100 mg Oral BID   escitalopram  2.5 mg Oral Daily   gelatin adsorbable       levothyroxine  88 mcg Oral Q0600   tobramycin       Vitamin D (Ergocalciferol)  50,000 Units Oral Q7 days   Continuous Infusions:   LOS: 2 days     Time spent: 25 mins.More than 50% of that time was spent in counseling and/or coordination of care.      Barnetta Chapel, MD [facility] P8/19/2022, 4:58 PM

## 2021-07-08 NOTE — Care Management Important Message (Signed)
Important Message  Patient Details  Name: Amy Weiss MRN: [id number] Date of Birth: [birth year]   Medicare Important Message Given:  Yes     Dave Mergen Stefan Church 07/08/2021, 3:09 PM

## 2021-07-09 DIAGNOSIS — R6510 Systemic inflammatory response syndrome (SIRS) of non-infectious origin without acute organ dysfunction: Secondary | ICD-10-CM

## 2021-07-09 LAB — SEDIMENTATION RATE: Sed Rate: 12 mm/hr (ref 0–22)

## 2021-07-09 MED ORDER — VANCOMYCIN HCL 1250 MG/250ML IV SOLN: 1250.0000 mg | INTRAVENOUS | Status: DC

## 2021-07-09 MED ORDER — DIPHENHYDRAMINE HCL 25 MG PO CAPS
25.0000 mg | ORAL_CAPSULE | Freq: Once | ORAL | Status: AC
  Administered 2021-07-09: 25 mg via ORAL
  Filled 2021-07-09: qty 1

## 2021-07-09 MED ORDER — SODIUM CHLORIDE 0.9 % IV SOLN
2.0000 g | Freq: Two times a day (BID) | INTRAVENOUS | Status: DC
  Administered 2021-07-09 – 2021-07-10 (×4): 2 g via INTRAVENOUS
  Filled 2021-07-09 (×4): qty 2

## 2021-07-09 MED ORDER — VANCOMYCIN HCL 1250 MG/250ML IV SOLN
1250.0000 mg | INTRAVENOUS | Status: DC
  Administered 2021-07-10: 1250 mg via INTRAVENOUS
  Filled 2021-07-09: qty 250

## 2021-07-09 MED ORDER — VANCOMYCIN HCL 1500 MG/300ML IV SOLN
1500.0000 mg | Freq: Once | INTRAVENOUS | Status: AC
  Administered 2021-07-09: 1500 mg via INTRAVENOUS
  Filled 2021-07-09: qty 300

## 2021-07-09 NOTE — Plan of Care (Signed)

## 2021-07-09 NOTE — Progress Notes (Signed)
PROGRESS NOTE    Amy Weiss  LEX:[id number] DOB: Jul 29, 1935 DOA: 07/06/2021 PCP: Colon Branch, MD   Chief Complain: Back pain  Brief Narrative:  Patient is 86 year old female with history of multiple compression fractures, dementia, chronic bradycardia, type 2 diabetes, hypothyroidism who presented from home with back pain.  Patient has history of multiple prior thoracic/lumbar vertebral body compression fractures s/p kyphoplasty  x3.  Last admission was from 7/11-7/15 for acute vertebral compression fracture of L3.  She was subsequently discharged back to home. She again presented with back pain.  MRI thoracic/lumbar spine done at this time showed mild acute T7 vertebral body compression fracture with minimal marrow edema within the T7 vertebral body without significant height loss.Also showed chronic T10 and T12 vertebral body compression fractures,acute-subacute L3 vertebral body compression fracture status post augmentation with moderate severity residual bone marrow edema and mild increased vertebral body height loss compared with the prior examination,chronic L1, L2 and L4 vertebral body compression fractures.  IR again consulted for possible need of kyphoplasty.  Also noted to have vitamin D deficiency, started on supplementation.  07/08/2021: Patient underwent outpatient vertebroplasty today. 07/09/2021: Patient seen.  Patient reports not feeling well.  Chills also reported.  T-max of 102.4 F reported.  Patient has been pancultured.  Was present on broad-spectrum IV antibiotics.  Patient underwent vertebroplasty yesterday.  Patient is not a particularly good historian.  Assessment & Plan:   Principal Problem:   Compression fracture of T7 vertebra (HCC) Active Problems:   Bradycardia   Back pain   Hypothyroid   DM2 (diabetes mellitus, type 2) (HCC)   Acute compression fracture of T7 vertebra: Has history of multiple compression fracture, status post kyphoplasty x3.No H/O recent  trauma. Continue pain management, supportive care.  IR has been consulted for possible need of kyphoplasty for T7 vertebra.  PT/OT will be consulted after decision from IR. Should be careful with the use of narcotics in this elderly female .  Patient has history of altered mental status with narcotics.  Continue bowel regimen. 07/08/2021: Patient underwent L3 vertebroplasty today. 07/09/2021: Status post vertebroplasty.  Patient is a poor historian.  Hypertension: Patient remains hypertensive.  Continue as needed medications for severe hypertension.  Could have been triggered by pain.  She is not on any medications at home.  Started on 5 mg amlodipine. 07/08/2021: Blood pressure remains uncontrolled.  Start patient on hydralazine 25 Mg p.o. every 6 hourly as needed.  Optimize pain control.  Fever and chills:  -Possible bacteremic/infection/SIRS following vertebroplasty. -Patient has been pancultured. -We will start patient on IV Vanco and cefepime. -Check ESR. -Low threshold to check calcitonin level.    Vitamin D deficiency: Vitamin D.  Started on high-dose supplementation  Diabetes type 2: Most recent hemoglobin A1c of 5.7.  Not  any medications at home.  Diet controlled.  Monitor blood sugars  Hypothyroidism: Continue Synthroid at current dose  Chronic bradycardia: Has history of secondary AV block Mobitz type I.  Patient has bradycardia at baseline.  She is asymptomatic.  Monitor on telemetry.  Debility/deconditioning :PT/OT consultation will be done when appropriate  DVT prophylaxis:SCD Code Status: DNR/DNI Family Communication: Called both daughters on phone today,calls not received Status is: Inpatient  Remains inpatient appropriate because:Inpatient level of care appropriate due to severity of illness  Dispo: The patient is from: Home              Anticipated d/c is to: Home vs SNF  Patient currently is not medically stable to d/c.   Difficult to place patient  No    Consultants: IR  Procedures:None yet  Antimicrobials:  Anti-infectives (From admission, onward)    Start     Dose/Rate Route Frequency Ordered Stop   07/08/21 0917  tobramycin (NEBCIN) 1.2 g powder       Note to Pharmacy: INGRAM, TRAVIS   : cabinet override      07/08/21 0917 07/08/21 2129   07/08/21 0800  ceFAZolin (ANCEF) IVPB 2g/100 mL premix        2 g 200 mL/hr over 30 Minutes Intravenous To [facility] 07/08/21 0737 07/08/21 0947       Subjective: Fever and chills.  Objective: Vitals:   07/09/21 0131 07/09/21 0311 07/09/21 0500 07/09/21 0828  BP: (!) 142/47 (!) 114/48  (!) 103/53  Pulse: (!) 50 (!) 39  (!) 36  Resp:  18  17  Temp: 100 F (37.8 C) 98.2 F (36.8 C)  97.8 F (36.6 C)  TempSrc: Oral Oral  Oral  SpO2:  92%  96%  Weight:   67.3 kg     Intake/Output Summary (Last 24 hours) at 07/09/2021 1121 Last data filed at 07/09/2021 0900 Gross per 24 hour  Intake 260 ml  Output 250 ml  Net 10 ml   Filed Weights   07/07/21 0500 07/09/21 0500  Weight: 68.4 kg 67.3 kg    Examination:  General exam: Not in significant distress.   HEENT: PERRL Respiratory system:  no wheezes or crackles  Cardiovascular system: S1 & S2 heard, RRR.  Gastrointestinal system: Abdomen is nondistended, soft and nontender. Central nervous system: Alert and awake but not oriented Extremities: No edema, no clubbing ,no cyanosis Skin: No rashes, no ulcers,no icterus      Data Reviewed: I have personally reviewed following labs and imaging studies  CBC: Recent Labs  Lab 07/06/21 1639 07/07/21 0257  WBC 8.0 7.2  NEUTROABS 4.8  --   HGB 13.4 12.4  HCT 41.7 37.3  MCV 91.0 87.8  PLT 241 818    Basic Metabolic Panel: Recent Labs  Lab 07/06/21 1639 07/06/21 2231 07/07/21 0257  NA 137  --  137  K 4.3  --  3.9  CL 106  --  103  CO2 24  --  25  GLUCOSE 83  --  103*  BUN 15  --  13  CREATININE 0.83  --  0.66  CALCIUM 8.9  --  9.1  MG  --  1.9 1.9     GFR: Estimated Creatinine Clearance: 51.8 mL/min (by C-G formula based on SCr of 0.66 mg/dL). Liver Function Tests: Recent Labs  Lab 07/07/21 0257  AST 18  ALT 9  ALKPHOS 67  BILITOT 0.6  PROT 6.1*  ALBUMIN 2.9*    No results for input(s): LIPASE, AMYLASE in the last 168 hours. No results for input(s): AMMONIA in the last 168 hours. Coagulation Profile: Recent Labs  Lab 07/07/21 0257  INR 1.0    Cardiac Enzymes: No results for input(s): CKTOTAL, CKMB, CKMBINDEX, TROPONINI in the last 168 hours. BNP (last 3 results) No results for input(s): PROBNP in the last 8760 hours. HbA1C: No results for input(s): HGBA1C in the last 72 hours. CBG: No results for input(s): GLUCAP in the last 168 hours. Lipid Profile: No results for input(s): CHOL, HDL, LDLCALC, TRIG, CHOLHDL, LDLDIRECT in the last 72 hours. Thyroid Function Tests: No results for input(s): TSH, T4TOTAL, FREET4, T3FREE, THYROIDAB in the  last 72 hours. Anemia Panel: No results for input(s): VITAMINB12, FOLATE, FERRITIN, TIBC, IRON, RETICCTPCT in the last 72 hours. Sepsis Labs: No results for input(s): PROCALCITON, LATICACIDVEN in the last 168 hours.  Recent Results (from the past 240 hour(s))  Resp Panel by RT-PCR (Flu A&B, Covid) Nasopharyngeal Swab     Status: None   Collection Time: 07/06/21  5:02 PM   Specimen: Nasopharyngeal Swab; Nasopharyngeal(NP) swabs in vial transport medium  Result Value Ref Range Status   SARS Coronavirus 2 by RT PCR NEGATIVE NEGATIVE Final    Comment: (NOTE) SARS-CoV-2 target nucleic acids are NOT DETECTED.  The SARS-CoV-2 RNA is generally detectable in upper respiratory specimens during the acute phase of infection. The lowest concentration of SARS-CoV-2 viral copies this assay can detect is 138 copies/mL. A negative result does not preclude SARS-Cov-2 infection and should not be used as the sole basis for treatment or other patient management decisions. A negative result may  occur with  improper specimen collection/handling, submission of specimen other than nasopharyngeal swab, presence of viral mutation(s) within the areas targeted by this assay, and inadequate number of viral copies(<138 copies/mL). A negative result must be combined with clinical observations, patient history, and epidemiological information. The expected result is Negative.  Fact Sheet for Patients:  EntrepreneurPulse.com.au  Fact Sheet for Healthcare Providers:  IncredibleEmployment.be  This test is no t yet approved or cleared by the [place] FDA and  has been authorized for detection and/or diagnosis of SARS-CoV-2 by FDA under an Emergency Use Authorization (EUA). This EUA will remain  in effect (meaning this test can be used) for the duration of the COVID-19 declaration under Section 564(b)(1) of the Act, 21 U.S.C.section 360bbb-3(b)(1), unless the authorization is terminated  or revoked sooner.       Influenza A by PCR NEGATIVE NEGATIVE Final   Influenza B by PCR NEGATIVE NEGATIVE Final    Comment: (NOTE) The Xpert Xpress SARS-CoV-2/FLU/RSV plus assay is intended as an aid in the diagnosis of influenza from Nasopharyngeal swab specimens and should not be used as a sole basis for treatment. Nasal washings and aspirates are unacceptable for Xpert Xpress SARS-CoV-2/FLU/RSV testing.  Fact Sheet for Patients: EntrepreneurPulse.com.au  Fact Sheet for Healthcare Providers: IncredibleEmployment.be  This test is not yet approved or cleared by the [place] FDA and has been authorized for detection and/or diagnosis of SARS-CoV-2 by FDA under an Emergency Use Authorization (EUA). This EUA will remain in effect (meaning this test can be used) for the duration of the COVID-19 declaration under Section 564(b)(1) of the Act, 21 U.S.C. section 360bbb-3(b)(1), unless the authorization is terminated  or revoked.  Performed at [facility], [place] [address] [place], [place] [address]           Radiology Studies: No results found.      Scheduled Meds:  amLODipine  5 mg Oral Daily   docusate sodium  100 mg Oral BID   escitalopram  2.5 mg Oral Daily   levothyroxine  88 mcg Oral Q0600   Vitamin D (Ergocalciferol)  50,000 Units Oral Q7 days   Continuous Infusions:   LOS: 3 days    Time spent: 35 mins.More than 50% of that time was spent in counseling and/or coordination of care.      Bonnell Public, MD [facility] P8/20/2022, 11:21 AM

## 2021-07-09 NOTE — Progress Notes (Signed)
Pharmacy Antibiotic Note  Amy Weiss is a 86 y.o. female admitted on 07/06/2021 with  fever s/p kyphoplasty . The patient has a history of multiple prior thoracic and lumbar vertebral body compression fractures, leading to 5 kyphoplasties since 2021 (3 to the thoracic and 2 to the lumbar spine). The patient was recently admitted to [facility] from 7/11-7/15 for an acute vertebral compression fracture of L3. An L3 vertebroplasty was done yesterday in which the patient developed a fever afterwards (Tmax 100.4 F) but is now afebrile with WBC WNL. Pharmacy has been consulted for vancomycin and cefepime dosing.  Plan: -Vancomycin IV 1500 mg x1 dose followed by 1250 mg Q24H (eAUC 544, Scr 0.66 but used 0.8 for calculations due to age) -Aim for a goal AUC 400-550 -Monitor renal function, clinical status, cultures, and length of therapy for deescalation  Weight: 67.3 kg (148 lb 5.9 oz)  Temp (24hrs), Avg:99.5 F (37.5 C), Min:97.8 F (36.6 C), Max:102.4 F (39.1 C)  Recent Labs  Lab 07/06/21 1639 07/07/21 0257  WBC 8.0 7.2  CREATININE 0.83 0.66    Estimated Creatinine Clearance: 51.8 mL/min (by C-G formula based on SCr of 0.66 mg/dL).    Allergies  Allergen Reactions   Ativan [Lorazepam]     A/w paradoxical reaction   Sulfonamide Derivatives Other (See Comments)    Skin irritation    Antimicrobials this admission: 8/20 vancomycin >> 8/20 cefepime >> 8/16 cefazolin x 1 dose  Microbiology results: 8/20 Bcx sent 8/20 Ucx sent 8/17 COVID/Flu negative   Thank you for allowing pharmacy to be a part of this patient's care.  Sanda Klein, PharmD, RPh  PGY-2 Pharmacy Resident 07/09/2021 1:53 PM  Please check [email or web address] for unit-specific pharmacy phone numbers.

## 2021-07-09 NOTE — Progress Notes (Signed)
PRN Labetalol 10mg  given per orders

## 2021-07-10 DIAGNOSIS — S22060D Wedge compression fracture of T7-T8 vertebra, subsequent encounter for fracture with routine healing: Secondary | ICD-10-CM

## 2021-07-10 LAB — URINALYSIS, ROUTINE W REFLEX MICROSCOPIC
Bacteria, UA: NONE SEEN
Bilirubin Urine: NEGATIVE
Glucose, UA: NEGATIVE mg/dL
Ketones, ur: NEGATIVE mg/dL
Nitrite: NEGATIVE
Protein, ur: NEGATIVE mg/dL
Specific Gravity, Urine: 1.012 (ref 1.005–1.030)
pH: 5 (ref 5.0–8.0)

## 2021-07-10 NOTE — Plan of Care (Signed)

## 2021-07-10 NOTE — Progress Notes (Signed)
PROGRESS NOTE    Amy Weiss  TVD:[id number] DOB: [birth year] DOA: 07/06/2021 PCP: Colon Branch, MD   Chief Complain: Back pain  Brief Narrative:  Patient is 86 year old female with history of multiple compression fractures, dementia, chronic bradycardia, type 2 diabetes, hypothyroidism who presented from home with back pain.  Patient has history of multiple prior thoracic/lumbar vertebral body compression fractures s/p kyphoplasty  x3.  Last admission was from 7/11-7/15 for acute vertebral compression fracture of L3.  She was subsequently discharged back to home. She again presented with back pain.  MRI thoracic/lumbar spine done at this time showed mild acute T7 vertebral body compression fracture with minimal marrow edema within the T7 vertebral body without significant height loss.Also showed chronic T10 and T12 vertebral body compression fractures,acute-subacute L3 vertebral body compression fracture status post augmentation with moderate severity residual bone marrow edema and mild increased vertebral body height loss compared with the prior examination,chronic L1, L2 and L4 vertebral body compression fractures.  IR again consulted for possible need of kyphoplasty.  Also noted to have vitamin D deficiency, started on supplementation.  07/08/2021: Patient underwent outpatient vertebroplasty today. 07/09/2021: Patient seen.  Patient reports not feeling well.  Chills also reported.  T-max of 102.4 F reported.  Patient has been pancultured.  Was present on broad-spectrum IV antibiotics.  Patient underwent vertebroplasty yesterday.  Patient is not a particularly good historian. 07/10/2021: Patient seen.  Patient looks better today.  No further fever documented.  UA revealed leukocytes.  Cultures are pending.  We will follow cultures closely.  May likely de-escalate antibiotics based on culture findings.  Assessment & Plan:   Principal Problem:   Compression fracture of T7 vertebra  (HCC) Active Problems:   Bradycardia   Back pain   Hypothyroid   DM2 (diabetes mellitus, type 2) (HCC)   SIRS (systemic inflammatory response syndrome) (HCC)   Acute compression fracture of T7 vertebra: Has history of multiple compression fracture, status post kyphoplasty x3.No H/O recent trauma. Continue pain management, supportive care.  IR has been consulted for possible need of kyphoplasty for T7 vertebra.  PT/OT will be consulted after decision from IR. Should be careful with the use of narcotics in this elderly female .  Patient has history of altered mental status with narcotics.  Continue bowel regimen. 07/08/2021: Patient underwent L3 vertebroplasty today. 07/09/2021: Status post vertebroplasty.  Patient is a poor historian.  Hypertension: Patient remains hypertensive.  Continue as needed medications for severe hypertension.  Could have been triggered by pain.  She is not on any medications at home.  Started on 5 mg amlodipine. 07/08/2021: Blood pressure remains uncontrolled.  Start patient on hydralazine 25 Mg p.o. every 6 hourly as needed.  Optimize pain control.  Fever and chills:  -Possible bacteremic/infection/SIRS following vertebroplasty. -Patient has been pancultured. -We will start patient on IV Vanco and cefepime. -Check ESR. -Low threshold to check calcitonin level. 07/10/2021: Patient seen.  Is better today.  Follow cultures.  Suspect patient may have UTI.  However, cannot rule out other possible etiologies.  Vitamin D deficiency: Vitamin D.  Started on high-dose supplementation  Diabetes type 2: Most recent hemoglobin A1c of 5.7.  Not  any medications at home.  Diet controlled.  Monitor blood sugars  Hypothyroidism: Continue Synthroid at current dose  Chronic bradycardia: Has history of secondary AV block Mobitz type I.  Patient has bradycardia at baseline.  She is asymptomatic.  Monitor on telemetry.  Debility/deconditioning :PT/OT consultation will be done when  appropriate  DVT prophylaxis:SCD Code Status: DNR/DNI Family Communication: Called both daughters on phone today,calls not received Status is: Inpatient  Remains inpatient appropriate because:Inpatient level of care appropriate due to severity of illness  Dispo: The patient is from: Home              Anticipated d/c is to: Home vs SNF              Patient currently is not medically stable to d/c.   Difficult to place patient No    Consultants: IR  Procedures:None yet  Antimicrobials:  Anti-infectives (From admission, onward)    Start     Dose/Rate Route Frequency Ordered Stop   07/10/21 1600  vancomycin (VANCOREADY) IVPB 1250 mg/250 mL        1,250 mg 166.7 mL/hr over 90 Minutes Intravenous Every 24 hours 07/09/21 1523     07/10/21 1500  vancomycin (VANCOREADY) IVPB 1250 mg/250 mL  Status:  Discontinued        1,250 mg 166.7 mL/hr over 90 Minutes Intravenous Every 24 hours 07/09/21 1410 07/09/21 1523   07/09/21 1500  vancomycin (VANCOREADY) IVPB 1500 mg/300 mL        1,500 mg 150 mL/hr over 120 Minutes Intravenous  Once 07/09/21 1410 07/09/21 1938   07/09/21 1500  ceFEPIme (MAXIPIME) 2 g in sodium chloride 0.9 % 100 mL IVPB        2 g 200 mL/hr over 30 Minutes Intravenous Every 12 hours 07/09/21 1410     07/08/21 0917  tobramycin (NEBCIN) 1.2 g powder       Note to Pharmacy: INGRAM, TRAVIS   : cabinet override      07/08/21 0917 07/08/21 2129   07/08/21 0800  ceFAZolin (ANCEF) IVPB 2g/100 mL premix        2 g 200 mL/hr over 30 Minutes Intravenous To [facility] 07/08/21 0737 07/08/21 0947       Subjective: Fever and chills have resolved..  Objective: Vitals:   07/09/21 1946 07/10/21 0853 07/10/21 1139 07/10/21 1604  BP: (!) 123/52 (!) 149/70 (!) 136/59 (!) 107/94  Pulse: (!) 41 (!) 35 (!) 40 (!) 40  Resp: 18 18 18 18   Temp: 98.3 F (36.8 C) 98.2 F (36.8 C) 98 F (36.7 C) 98.4 F (36.9 C)  TempSrc: Oral Oral Oral Oral  SpO2: 95% 98% 99% 97%   Weight:        Intake/Output Summary (Last 24 hours) at 07/10/2021 1607 Last data filed at 07/10/2021 0727 Gross per 24 hour  Intake 530 ml  Output 400 ml  Net 130 ml    Filed Weights   07/07/21 0500 07/09/21 0500  Weight: 68.4 kg 67.3 kg    Examination:  General exam: Not in significant distress.   HEENT: PERRL Respiratory system:  no wheezes or crackles  Cardiovascular system: S1 & S2 heard, RRR.  Gastrointestinal system: Abdomen is nondistended, soft and nontender. Central nervous system: Alert and awake but not oriented Extremities: No edema, no clubbing ,no cyanosis Skin: No rashes, no ulcers,no icterus      Data Reviewed: I have personally reviewed following labs and imaging studies  CBC: Recent Labs  Lab 07/06/21 1639 07/07/21 0257  WBC 8.0 7.2  NEUTROABS 4.8  --   HGB 13.4 12.4  HCT 41.7 37.3  MCV 91.0 87.8  PLT 241 096    Basic Metabolic Panel: Recent Labs  Lab 07/06/21 1639 07/06/21 2231 07/07/21 0257  NA 137  --  137  K 4.3  --  3.9  CL 106  --  103  CO2 24  --  25  GLUCOSE 83  --  103*  BUN 15  --  13  CREATININE 0.83  --  0.66  CALCIUM 8.9  --  9.1  MG  --  1.9 1.9    GFR: Estimated Creatinine Clearance: 51.8 mL/min (by C-G formula based on SCr of 0.66 mg/dL). Liver Function Tests: Recent Labs  Lab 07/07/21 0257  AST 18  ALT 9  ALKPHOS 67  BILITOT 0.6  PROT 6.1*  ALBUMIN 2.9*    No results for input(s): LIPASE, AMYLASE in the last 168 hours. No results for input(s): AMMONIA in the last 168 hours. Coagulation Profile: Recent Labs  Lab 07/07/21 0257  INR 1.0    Cardiac Enzymes: No results for input(s): CKTOTAL, CKMB, CKMBINDEX, TROPONINI in the last 168 hours. BNP (last 3 results) No results for input(s): PROBNP in the last 8760 hours. HbA1C: No results for input(s): HGBA1C in the last 72 hours. CBG: No results for input(s): GLUCAP in the last 168 hours. Lipid Profile: No results for input(s): CHOL, HDL, LDLCALC,  TRIG, CHOLHDL, LDLDIRECT in the last 72 hours. Thyroid Function Tests: No results for input(s): TSH, T4TOTAL, FREET4, T3FREE, THYROIDAB in the last 72 hours. Anemia Panel: No results for input(s): VITAMINB12, FOLATE, FERRITIN, TIBC, IRON, RETICCTPCT in the last 72 hours. Sepsis Labs: No results for input(s): PROCALCITON, LATICACIDVEN in the last 168 hours.  Recent Results (from the past 240 hour(s))  Resp Panel by RT-PCR (Flu A&B, Covid) Nasopharyngeal Swab     Status: None   Collection Time: 07/06/21  5:02 PM   Specimen: Nasopharyngeal Swab; Nasopharyngeal(NP) swabs in vial transport medium  Result Value Ref Range Status   SARS Coronavirus 2 by RT PCR NEGATIVE NEGATIVE Final    Comment: (NOTE) SARS-CoV-2 target nucleic acids are NOT DETECTED.  The SARS-CoV-2 RNA is generally detectable in upper respiratory specimens during the acute phase of infection. The lowest concentration of SARS-CoV-2 viral copies this assay can detect is 138 copies/mL. A negative result does not preclude SARS-Cov-2 infection and should not be used as the sole basis for treatment or other patient management decisions. A negative result may occur with  improper specimen collection/handling, submission of specimen other than nasopharyngeal swab, presence of viral mutation(s) within the areas targeted by this assay, and inadequate number of viral copies(<138 copies/mL). A negative result must be combined with clinical observations, patient history, and epidemiological information. The expected result is Negative.  Fact Sheet for Patients:  EntrepreneurPulse.com.au  Fact Sheet for Healthcare Providers:  IncredibleEmployment.be  This test is no t yet approved or cleared by the [place] FDA and  has been authorized for detection and/or diagnosis of SARS-CoV-2 by FDA under an Emergency Use Authorization (EUA). This EUA will remain  in effect (meaning this test can be  used) for the duration of the COVID-19 declaration under Section 564(b)(1) of the Act, 21 U.S.C.section 360bbb-3(b)(1), unless the authorization is terminated  or revoked sooner.       Influenza A by PCR NEGATIVE NEGATIVE Final   Influenza B by PCR NEGATIVE NEGATIVE Final    Comment: (NOTE) The Xpert Xpress SARS-CoV-2/FLU/RSV plus assay is intended as an aid in the diagnosis of influenza from Nasopharyngeal swab specimens and should not be used as a sole basis for treatment. Nasal washings and aspirates are unacceptable for Xpert Xpress SARS-CoV-2/FLU/RSV testing.  Fact Sheet for Patients: EntrepreneurPulse.com.au  Fact  Sheet for Healthcare Providers: IncredibleEmployment.be  This test is not yet approved or cleared by the [place] and has been authorized for detection and/or diagnosis of SARS-CoV-2 by FDA under an Emergency Use Authorization (EUA). This EUA will remain in effect (meaning this test can be used) for the duration of the COVID-19 declaration under Section 564(b)(1) of the Act, 21 U.S.C. section 360bbb-3(b)(1), unless the authorization is terminated or revoked.  Performed at [facility], [place] [address] [place], [place] [address]   Culture, blood (routine x 2)     Status: None (Preliminary result)   Collection Time: 07/09/21 10:53 AM   Specimen: BLOOD  Result Value Ref Range Status   Specimen Description BLOOD LEFT ANTECUBITAL  Final   Special Requests   Final    BOTTLES DRAWN AEROBIC AND ANAEROBIC Blood Culture results may not be optimal due to an inadequate volume of blood received in culture bottles   Culture   Final    NO GROWTH 1 DAY Performed at [facility], [place] [address], [place], [place] [address]    Report Status PENDING  Incomplete  Culture, blood (routine x 2)     Status: None (Preliminary result)   Collection Time: 07/09/21 10:53 AM   Specimen: BLOOD  Result Value Ref Range Status    Specimen Description BLOOD RIGHT ANTECUBITAL  Final   Special Requests   Final    BOTTLES DRAWN AEROBIC AND ANAEROBIC Blood Culture adequate volume   Culture   Final    NO GROWTH 1 DAY Performed at [facility], [place] [address] [place], [place] [address]    Report Status PENDING  Incomplete          Radiology Studies: No results found.      Scheduled Meds:  amLODipine  5 mg Oral Daily   docusate sodium  100 mg Oral BID   escitalopram  2.5 mg Oral Daily   levothyroxine  88 mcg Oral Q0600   Vitamin D (Ergocalciferol)  50,000 Units Oral Q7 days   Continuous Infusions:  ceFEPime (MAXIPIME) IV 2 g (07/10/21 0928)   vancomycin       LOS: 4 days    Time spent: 25 mins.More than 50% of that time was spent in counseling and/or coordination of care.      Bonnell Public, MD [facility] P8/21/2022, 4:07 PM

## 2021-07-10 NOTE — Plan of Care (Signed)
Pt is alert oriented x 2. Pt c/o back pain prn norco given, pt now resting. Pt has purewick in place. [name] asked to let pt rest not to obtain VS while she's asleep. Pt resting no distress noted.  Pt continues to receive IV antibiotics.    Problem: Education: Goal: Knowledge of General Education information will improve Description: Including pain rating scale, medication(s)/side effects and non-pharmacologic comfort measures Outcome: Progressing   Problem: Health Behavior/Discharge Planning: Goal: Ability to manage health-related needs will improve Outcome: Progressing   Problem: Clinical Measurements: Goal: Ability to maintain clinical measurements within normal limits will improve Outcome: Progressing Goal: Will remain free from infection Outcome: Progressing Goal: Diagnostic test results will improve Outcome: Progressing Goal: Respiratory complications will improve Outcome: Progressing Goal: Cardiovascular complication will be avoided Outcome: Progressing   Problem: Activity: Goal: Risk for activity intolerance will decrease Outcome: Progressing   Problem: Nutrition: Goal: Adequate nutrition will be maintained Outcome: Progressing   Problem: Coping: Goal: Level of anxiety will decrease Outcome: Progressing   Problem: Elimination: Goal: Will not experience complications related to bowel motility Outcome: Progressing Goal: Will not experience complications related to urinary retention Outcome: Progressing   Problem: Pain Managment: Goal: General experience of comfort will improve Outcome: Progressing   Problem: Safety: Goal: Ability to remain free from injury will improve Outcome: Progressing   Problem: Skin Integrity: Goal: Risk for impaired skin integrity will decrease Outcome: Progressing

## 2021-07-11 LAB — BASIC METABOLIC PANEL
Anion gap: 9 (ref 5–15)
BUN: 18 mg/dL (ref 8–23)
CO2: 21 mmol/L — ABNORMAL LOW (ref 22–32)
Calcium: 9.3 mg/dL (ref 8.9–10.3)
Chloride: 106 mmol/L (ref 98–111)
Creatinine, Ser: 0.67 mg/dL (ref 0.44–1.00)
GFR, Estimated: >60 mL/min (ref >60)
Glucose, Bld: 110 mg/dL — ABNORMAL HIGH (ref 70–99)
Potassium: 4.5 mmol/L (ref 3.5–5.1)
Sodium: 136 mmol/L (ref 135–145)

## 2021-07-11 LAB — URINE CULTURE: Culture: NO GROWTH

## 2021-07-11 LAB — GLUCOSE, CAPILLARY
Glucose-Capillary: 122 mg/dL — ABNORMAL HIGH (ref 70–99)
Glucose-Capillary: 89 mg/dL (ref 70–99)

## 2021-07-11 MED ORDER — HYDROCODONE-ACETAMINOPHEN 5-325 MG PO TABS: 1.0000 | ORAL_TABLET | Freq: Four times a day (QID) | ORAL | 0 refills | Status: DC | PRN

## 2021-07-11 NOTE — Progress Notes (Signed)
Pt medically stable for d/c per MD order. IV and tele removed. Pt belongings: cell phone, charger, glasses, clothing, shoes. Discharge paperwork reviewed with daughter [name], all questions answered. Pt leaving for home.

## 2021-07-11 NOTE — TOC Transition Note (Signed)
Transition of Care [facility]) - CM/SW Discharge Note   Patient Details  Name: Amy Weiss MRN: [id number] Date of Birth: 1935-05-20  Transition of Care [facility]) CM/SW Contact:  Pollie Friar, RN Phone Number: 07/11/2021, 12:07 PM   Clinical Narrative:    Patient is s/p vertebroplasty. She is from home with caregivers. Pt was active with [facility] for [facility] PT prior to admission. CM met with the patient and spoke to her daughter, [name] over the phone. [name] states they have coordinated caregivers for her like they did after last surgery. She will have someone 9a-2 pm and 4p-6p daily during the week. Daughter states her confusion here is environmental and she does much better at home.  Daughter asking to continue with [facility] for home therapy. CM has updated MD for orders and Kenzie with [facility] of resumption needs.  Daughter to provide transport home when she gets off work at 5 pm. Physiological scientist updated.    Final next level of care: Home w Home Health Services Barriers to Discharge: No Barriers Identified   Patient Goals and CMS Choice   CMS Medicare.gov Compare Post Acute Care list provided to:: Patient Represenative (must comment) Choice offered to / list presented to : Adult Children  Discharge Placement                       Discharge Plan and Services                          HH Arranged: PT [place] Agency: [place] ([facility]) Date [place]: 07/11/21   Representative spoke with at [place]: [place] ([place]) Interventions     Readmission Risk Interventions No flowsheet data found.

## 2021-07-11 NOTE — Evaluation (Signed)
Physical Therapy Evaluation Patient Details Name: Amy Weiss MRN: [id number] DOB: [birth year] Today's Date: 07/11/2021   History of Present Illness  Patient is an 86 year old female who presents with back pain and underwent T7 vertebroplasty on 07/09/21. Has had fever in hospital with leukocytes on UA. PMH: dementia, hypothyroidism and multiple falls, compression fxs, and vertebroplasties.  Clinical Impression  Patient evaluated by Physical Therapy with no further acute PT needs identified. All education has been completed and the patient has no further questions. Pt received in bed and able to come to EOB and return to supine with supervision but no physical assist despite reporting 10/10 pain. Pt ambulated 30' with RW and min guard A because she insisted that she not be touched. Gait belt used for safety in case she lost her balance but she did not. Pt agitated and somewhat aggressive in hospital, expect she will do much better cognitively at home which will also help her progress physically.  Recommend returning home and resuming HHPT. Rec family stay with her overnight so she has 24/7 supervision when first home. See below for any follow-up Physical Therapy or equipment needs. PT is signing off. Thank you for this referral.     Follow Up Recommendations Home health PT;Supervision/Assistance - 24 hour    Equipment Recommendations  None recommended by PT    Recommendations for Other Services       Precautions / Restrictions Precautions Precautions: Fall Precaution Comments: has had multiple falls Restrictions Weight Bearing Restrictions: No      Mobility  Bed Mobility Overal bed mobility: Needs Assistance Bed Mobility: Supine to Sit;Sit to Supine     Supine to sit: Min guard Sit to supine: Min guard   General bed mobility comments: pt able to come to EOB and return to supine with vc's but no physical assist    Transfers Overall transfer level: Needs assistance Equipment  used: Rolling walker (2 wheeled) Transfers: Sit to/from Stand Sit to Stand: Min guard         General transfer comment: pt adamant that she did not want to be touched. Close guarding given but pt able to power up to RW  Ambulation/Gait Ambulation/Gait assistance: Min guard Gait Distance (Feet): 30 Feet Assistive device: Rolling walker (2 wheeled) Gait Pattern/deviations: Step-through pattern;Decreased stride length Gait velocity: decreased Gait velocity interpretation: <1.8 ft/sec, indicate of risk for recurrent falls General Gait Details: pt c/o pain but able to ambulate 2 laps in room with RW and min-guard A  Stairs            Wheelchair Mobility    Modified Rankin (Stroke Patients Only)       Balance Overall balance assessment: History of Falls;Needs assistance Sitting-balance support: Feet supported;No upper extremity supported Sitting balance-Leahy Scale: Fair     Standing balance support: Bilateral upper extremity supported;During functional activity Standing balance-Leahy Scale: Poor Standing balance comment: needs UE support for safety                             Pertinent Vitals/Pain Pain Assessment: 0-10 Pain Score: 10-Worst pain ever Pain Location: back Pain Descriptors / Indicators: Aching;Sore;Stabbing Pain Intervention(s): Limited activity within patient's tolerance;Monitored during session;Premedicated before session    Home Living Family/patient expects to be discharged to:: Private residence Living Arrangements: Alone Available Help at Discharge: Family;Available 24 hours/day;Personal care attendant Type of Home: House Home Access: Stairs to enter Entrance Stairs-Rails: None Entrance Stairs-Number of Steps: 1  Home Layout: One level Home Equipment: Bedside commode;Shower seat;Hospital bed;Grab bars - toilet;Grab bars - tub/shower;Walker - 2 wheels;Hand held shower head Additional Comments: 2 steps into living room from bedroom     Prior Function Level of Independence: Needs assistance         Comments: pt has nearly 24 hr care and has been receiving HHPT     Hand Dominance   Dominant Hand: Left    Extremity/Trunk Assessment   Upper Extremity Assessment Upper Extremity Assessment: Overall WFL for tasks assessed    Lower Extremity Assessment Lower Extremity Assessment: Generalized weakness    Cervical / Trunk Assessment Cervical / Trunk Assessment: Kyphotic  Communication   Communication: No difficulties  Cognition Arousal/Alertness: Awake/alert Behavior During Therapy: Agitated Overall Cognitive Status: Impaired/Different from baseline                                 General Comments: pt with dementia at baseline today but agitated today which is her normal when hospitalized per caregiver      General Comments General comments (skin integrity, edema, etc.): caregiver present for eval. Expect pt will do much better from a cognitive standpoint at home.    Exercises     Assessment/Plan    PT Assessment All further PT needs can be met in the next venue of care  PT Problem List Decreased activity tolerance;Decreased balance;Decreased mobility;Decreased cognition;Decreased coordination;Pain       PT Treatment Interventions      PT Goals (Current goals can be found in the Care Plan section)  Acute Rehab PT Goals Patient Stated Goal: return home PT Goal Formulation: All assessment and education complete, DC therapy    Frequency     Barriers to discharge        Co-evaluation               AM-PAC PT "6 Clicks" Mobility  Outcome Measure Help needed turning from your back to your side while in a flat bed without using bedrails?: A Little Help needed moving from lying on your back to sitting on the side of a flat bed without using bedrails?: A Little Help needed moving to and from a bed to a chair (including a wheelchair)?: A Little Help needed standing up from a  chair using your arms (e.g., wheelchair or bedside chair)?: A Little Help needed to walk in hospital room?: A Little Help needed climbing 3-5 steps with a railing? : A Little 6 Click Score: 18    End of Session Equipment Utilized During Treatment: Gait belt Activity Tolerance: Patient tolerated treatment well Patient left: in bed;with call bell/phone within reach;with bed alarm set;with family/visitor present Nurse Communication: Mobility status PT Visit Diagnosis: Unsteadiness on feet (R26.81);Repeated falls (R29.6);Pain Pain - part of body:  (back)    Time: 8592-9244 PT Time Calculation (min) (ACUTE ONLY): 15 min   Charges:   PT Evaluation $PT Eval Low Complexity: 1 Low          Leighton Roach, PT  Acute Rehab Services  Pager [phone] Office [place] 07/11/2021, 2:06 PM

## 2021-07-11 NOTE — Progress Notes (Addendum)
Patient refusing to leave room on discharge. Daughter in room. Patient refuses to allow staff to touch her, remove IV, put on clothes. States "I live here." MD notified. Charge nurse notified and also assessed at bedside. Will continue to monitor.

## 2021-07-11 NOTE — Plan of Care (Signed)
Pt is alert to self. Pt has rested but had moments of agitation and trying to get out of bed. Pt reoriented. Pt is ready to get up  and start walking again. Pt needs PT order to be evaluated. Pts daughter also would like to speak with doctor about PT order and use of antibiotics. Pts daughter has requested for pt not to be woken for VS during night as this increases her confusion. PRN pain medication given x 2 through night with effective results.   Problem: Education: Goal: Knowledge of General Education information will improve Description: Including pain rating scale, medication(s)/side effects and non-pharmacologic comfort measures Outcome: Progressing   Problem: Health Behavior/Discharge Planning: Goal: Ability to manage health-related needs will improve Outcome: Progressing   Problem: Clinical Measurements: Goal: Ability to maintain clinical measurements within normal limits will improve Outcome: Progressing Goal: Will remain free from infection Outcome: Progressing Goal: Diagnostic test results will improve Outcome: Progressing Goal: Respiratory complications will improve Outcome: Progressing Goal: Cardiovascular complication will be avoided Outcome: Progressing   Problem: Activity: Goal: Risk for activity intolerance will decrease Outcome: Progressing   Problem: Nutrition: Goal: Adequate nutrition will be maintained Outcome: Progressing   Problem: Coping: Goal: Level of anxiety will decrease Outcome: Progressing   Problem: Elimination: Goal: Will not experience complications related to bowel motility Outcome: Progressing Goal: Will not experience complications related to urinary retention Outcome: Progressing   Problem: Pain Managment: Goal: General experience of comfort will improve Outcome: Progressing   Problem: Safety: Goal: Ability to remain free from injury will improve Outcome: Progressing   Problem: Skin Integrity: Goal: Risk for impaired skin  integrity will decrease Outcome: Progressing

## 2021-07-11 NOTE — Plan of Care (Signed)

## 2021-07-11 NOTE — Discharge Summary (Signed)
Physician Discharge Summary  Amy Weiss DGU:[id number] DOB: Mar 21, 1935 DOA: 07/06/2021  PCP: Amy Plump, MD  Admit date: 07/06/2021 Discharge date: 07/11/2021  Admitted From: Home Disposition:  Home   Recommendations for Outpatient Follow-up:  Follow up with Dr. Drue Weiss as soon as able       Home Health: PT/OT due to pain with ambulation Equipment/Devices: None new  Discharge Condition: Fair  CODE STATUS: FULL Diet recommendation: Regualr  Brief/Interim Summary: Amy Weiss is an 86 y.o. F with dementia, recurrent vertebral fractures, HTN, and hypothyroidism who presented with back pain.  She was bending over to put in her phone charger and had acute onset of back pain.  In the ER, magnetic resonance imaging showed an acute T7 compression fracture.       PRINCIPAL HOSPITAL DIAGNOSIS: T7 Compression fracture    Discharge Diagnoses:   Acute T7 vertebral compression fracture Status post kyphoplasty by Dr. Corliss Weiss Patient was admitted and given analgesics.  Dr. Corliss Weiss was consulted and recommended kyphoplasty.  Patient was able to tolerate ambulation well after the procedure, discharged home with home health.    Fever Patient with fever to 102F on hospital day 2.  Blood and urine cultures were obtained and the patient was started on empiric antibiotics.  She had no further fever, nor symptoms of cough, sputum production, dysuria, or urinary irritation.  Urine culture had no growth.  Blood cultures were no growth at 48 hours and she had no further fever, and so antibiotics were discontinued and she was given return precautions.       Hypothyroidism  Dementia  Hypertension  Normocytic anemia          Discharge Instructions  Discharge Instructions     Discharge instructions   Complete by: As directed    From Dr. Maryfrances Weiss: You were admitted with a compression fracture.   You were treated with Kyphoplasty  You should work with physical therapy and  strengthen your body just as you did after your last fracture Take the hydrocodone cautiously, as last time  Go see your primary care doctor in 1 week  Return for fever, chills, cough, confusion or weakness   Increase activity slowly   Complete by: As directed       Allergies as of 07/11/2021       Reactions   Ativan [lorazepam]    A/w paradoxical reaction   Sulfonamide Derivatives Other (See Comments)   Skin irritation        Medication List     TAKE these medications    acetaminophen 500 MG tablet Commonly known as: TYLENOL Take 500 mg by mouth 2 (two) times daily.   aspirin EC 81 MG tablet Take 81 mg by mouth every morning.   diclofenac Sodium 1 % Gel Commonly known as: VOLTAREN Apply 2 g topically 4 (four) times daily.   escitalopram 5 MG tablet Commonly known as: LEXAPRO Take 0.5 tablets (2.5 mg total) by mouth daily.   HYDROcodone-acetaminophen 5-325 MG tablet Commonly known as: NORCO/VICODIN Take 1 tablet by mouth every 6 (six) hours as needed for moderate pain or severe pain. What changed: when to take this   levothyroxine 88 MCG tablet Commonly known as: SYNTHROID TAKE 1 TABLET BY MOUTH DAILY BEFORE BREAKFAST.   polyethylene glycol 17 g packet Commonly known as: MIRALAX / GLYCOLAX Take 17 g by mouth daily as needed for moderate constipation (with each dose of Norco/hydrocodone).        Allergies  Allergen Reactions  Ativan [Lorazepam]     A/w paradoxical reaction   Sulfonamide Derivatives Other (See Comments)    Skin irritation    Consultations: IR   Procedures/Studies: MR THORACIC SPINE WO CONTRAST  Result Date: 07/06/2021 CLINICAL DATA:  Mid back pain.  Increased pain while walking. EXAM: MRI THORACIC AND LUMBAR SPINE WITHOUT CONTRAST TECHNIQUE: Multiplanar and multiecho pulse sequences of the thoracic and lumbar spine were obtained without intravenous contrast. COMPARISON:  05/30/2021 FINDINGS: MRI THORACIC SPINE FINDINGS  Alignment:  Physiologic. Vertebrae: No discitis or aggressive osseous lesion. Mild acute T7 vertebral body compression fracture with minimal marrow edema within the T7 vertebral body without significant height loss. Chronic T10 vertebral body compression fracture with 50% height loss status post vertebral body augmentation. Chronic T12 vertebral body compression fracture with approximately 30% height loss status post vertebral body augmentation. Cord:  Normal signal and morphology. Paraspinal and other soft tissues: Negative. Disc levels: No significant thoracic spine disc protrusion, foraminal stenosis or central canal stenosis. MRI LUMBAR SPINE FINDINGS Segmentation:  Standard. Alignment:  Physiologic. Vertebrae: No discitis or osteomyelitis. Chronic L1 vertebral body compression fracture with approximately 70% anterior height loss and 3 mm of retropulsion of the superior posterior margin of the L1 vertebral body status post prior augmentation. Chronic L2 vertebral body compression fracture with 20% height loss status post prior augmentation. L3 vertebral body compression fracture status post augmentation with moderate severity residual bone marrow edema and mild increased vertebral body height loss compared with the prior examination of 05/30/2021. Chronic L4 vertebral body compression fracture Conus medullaris and cauda equina: Conus extends to the T12 level. Conus and cauda equina appear normal. Paraspinal and other soft tissues: No acute paraspinal abnormality. Disc levels: Degenerative disease with disc height loss at L4-5. Mild disc height loss at L5-S1. IMPRESSION: MR THORACIC SPINE IMPRESSION 1. Mild acute T7 vertebral body compression fracture with minimal marrow edema within the T7 vertebral body without significant height loss. 2. Chronic T10 and T12 vertebral body compression fractures. MR LUMBAR SPINE IMPRESSION 1. Acute-subacute L3 vertebral body compression fracture status post augmentation with  moderate severity residual bone marrow edema and mild increased vertebral body height loss compared with the prior examination of 05/30/2021. 2. Chronic L1, L2 and L4 vertebral body compression fractures. Electronically Signed   By: Elige KoHetal  Patel M.D.   On: 07/06/2021 15:06   MR LUMBAR SPINE WO CONTRAST  Result Date: 07/06/2021 CLINICAL DATA:  Mid back pain.  Increased pain while walking. EXAM: MRI THORACIC AND LUMBAR SPINE WITHOUT CONTRAST TECHNIQUE: Multiplanar and multiecho pulse sequences of the thoracic and lumbar spine were obtained without intravenous contrast. COMPARISON:  05/30/2021 FINDINGS: MRI THORACIC SPINE FINDINGS Alignment:  Physiologic. Vertebrae: No discitis or aggressive osseous lesion. Mild acute T7 vertebral body compression fracture with minimal marrow edema within the T7 vertebral body without significant height loss. Chronic T10 vertebral body compression fracture with 50% height loss status post vertebral body augmentation. Chronic T12 vertebral body compression fracture with approximately 30% height loss status post vertebral body augmentation. Cord:  Normal signal and morphology. Paraspinal and other soft tissues: Negative. Disc levels: No significant thoracic spine disc protrusion, foraminal stenosis or central canal stenosis. MRI LUMBAR SPINE FINDINGS Segmentation:  Standard. Alignment:  Physiologic. Vertebrae: No discitis or osteomyelitis. Chronic L1 vertebral body compression fracture with approximately 70% anterior height loss and 3 mm of retropulsion of the superior posterior margin of the L1 vertebral body status post prior augmentation. Chronic L2 vertebral body compression fracture with 20% height loss  status post prior augmentation. L3 vertebral body compression fracture status post augmentation with moderate severity residual bone marrow edema and mild increased vertebral body height loss compared with the prior examination of 05/30/2021. Chronic L4 vertebral body compression  fracture Conus medullaris and cauda equina: Conus extends to the T12 level. Conus and cauda equina appear normal. Paraspinal and other soft tissues: No acute paraspinal abnormality. Disc levels: Degenerative disease with disc height loss at L4-5. Mild disc height loss at L5-S1. IMPRESSION: MR THORACIC SPINE IMPRESSION 1. Mild acute T7 vertebral body compression fracture with minimal marrow edema within the T7 vertebral body without significant height loss. 2. Chronic T10 and T12 vertebral body compression fractures. MR LUMBAR SPINE IMPRESSION 1. Acute-subacute L3 vertebral body compression fracture status post augmentation with moderate severity residual bone marrow edema and mild increased vertebral body height loss compared with the prior examination of 05/30/2021. 2. Chronic L1, L2 and L4 vertebral body compression fractures. Electronically Signed   By: Elige Ko M.D.   On: 07/06/2021 15:06   IR VERTEBROPLASTY LUMBAR BX INC UNI/BIL INC INJECT/IMAGING  Result Date: 07/11/2021 INDICATION: Severe low back pain due to compression fracture at L3. EXAM: VERTEBROPLASTY AT L3 MEDICATIONS: As antibiotic prophylaxis, Ancef 2 g IV was ordered pre-procedure and administered intravenously within 1 hour of incision. ANESTHESIA/SEDATION: Moderate (conscious) sedation was employed during this procedure. A total of Versed 1.5 mg and Fentanyl 37.5 mcg was administered intravenously. Moderate Sedation Time: 25 minutes. The patient's level of consciousness and vital signs were monitored continuously by radiology nursing throughout the procedure under my direct supervision. FLUOROSCOPY TIME:  Fluoroscopy Time: 11 minutes 42 seconds (447 mGy) COMPLICATIONS: None immediate. TECHNIQUE: Informed written consent was obtained from the patient after a thorough discussion of the procedural risks, benefits and alternatives. All questions were addressed. Maximal Sterile Barrier Technique was utilized including caps, mask, sterile gowns,  sterile gloves, sterile drape, hand hygiene and skin antiseptic. A timeout was performed prior to the initiation of the procedure. PROCEDURE: The patient was placed prone on the fluoroscopic table. Nasal oxygen was administered. Physiologic monitoring was performed throughout the duration of the procedure. The skin overlying the lumbar region was prepped and draped in the usual sterile fashion. The L3 vertebral body was identified and the right pedicle was infiltrated with 0.25% Bupivacaine. This was then followed by the advancement of a 13-gauge Cook needle through the right pedicle into the anterior one-third at L3. A gentle contrast injection demonstrated a trabecular pattern of contrast with early opacification of paraspinous veins. This necessitated the use of Gelfoam pledgets delivered through the 13 gauge Cook spinal needle into the L3 vertebral body prior to methylmethacrylate injection. At this time, methylmethacrylate mixture was reconstituted. Under biplane intermittent fluoroscopy, the methylmethacrylate was then injected into the L3 vertebral body with filling of the vertebral body. No extravasation was noted into the disk spaces or posteriorly into the spinal canal. Small sliver of methylmethacrylate mixture was seen extruding into a tiny paraspinous vein where it remained stable throughout the procedure. The needle was then removed. Hemostasis was achieved at the skin entry site. There were no acute complications. Patient tolerated the procedure well. Patient was then transferred back to the floor in stable condition. IMPRESSION: 1. Status post vertebral body augmentation for painful compression fracture at L3 using vertebroplasty technique. Electronically Signed   By: Julieanne Cotton M.D.   On: 07/08/2021 15:46      Subjective: Patient complains of pain, but she is mobile without assistance.  She is somewhat agitated  Discharge Exam: Vitals:   07/11/21 1220 07/11/21 1602  BP: (!) 142/54  (!) 169/55  Pulse: (!) 42 (!) 41  Resp: 20 20  Temp: 98.6 F (37 C) 98.5 F (36.9 C)  SpO2: 96% 98%   Vitals:   07/10/21 2209 07/11/21 0741 07/11/21 1220 07/11/21 1602  BP: (!) 157/51 (!) 144/57 (!) 142/54 (!) 169/55  Pulse: (!) 36 (!) 40 (!) 42 (!) 41  Resp: Temp: 98.1 F (36.7 C) 98.1 F (36.7 C) 98.6 F (37 C) 98.5 F (36.9 C)  TempSrc: Oral Oral Oral Oral  SpO2: 98% 97% 96% 98%  Weight:        General: Pt is alert, awake, not in acute distress Cardiovascular: RRR, nl S1-S2, no murmurs appreciated.   No LE edema.   Respiratory: Normal respiratory rate and rhythm.  CTAB without rales or wheezes. Abdominal: Abdomen soft and non-tender.  No distension or HSM.   Neuro/Psych: Strength symmetric in upper and lower extremities.  Judgment and insight appear impaired.   The results of significant diagnostics from this hospitalization (including imaging, microbiology, ancillary and laboratory) are listed below for reference.     Microbiology: Recent Results (from the past 240 hour(s))  Resp Panel by RT-PCR (Flu A&B, Covid) Nasopharyngeal Swab     Status: None   Collection Time: 07/06/21  5:02 PM   Specimen: Nasopharyngeal Swab; Nasopharyngeal(NP) swabs in vial transport medium  Result Value Ref Range Status   SARS Coronavirus 2 by RT PCR NEGATIVE NEGATIVE Final    Comment: (NOTE) SARS-CoV-2 target nucleic acids are NOT DETECTED.  The SARS-CoV-2 RNA is generally detectable in upper respiratory specimens during the acute phase of infection. The lowest concentration of SARS-CoV-2 viral copies this assay can detect is 138 copies/mL. A negative result does not preclude SARS-Cov-2 infection and should not be used as the sole basis for treatment or other patient management decisions. A negative result may occur with  improper specimen collection/handling, submission of specimen other than nasopharyngeal swab, presence of viral mutation(s) within the areas targeted by  this assay, and inadequate number of viral copies(<138 copies/mL). A negative result must be combined with clinical observations, patient history, and epidemiological information. The expected result is Negative.  Fact Sheet for Patients:  BloggerCourse.com  Fact Sheet for Healthcare Providers:  SeriousBroker.it  This test is no t yet approved or cleared by the [place] FDA and  has been authorized for detection and/or diagnosis of SARS-CoV-2 by FDA under an Emergency Use Authorization (EUA). This EUA will remain  in effect (meaning this test can be used) for the duration of the COVID-19 declaration under Section 564(b)(1) of the Act, 21 U.S.C.section 360bbb-3(b)(1), unless the authorization is terminated  or revoked sooner.       Influenza A by PCR NEGATIVE NEGATIVE Final   Influenza B by PCR NEGATIVE NEGATIVE Final    Comment: (NOTE) The Xpert Xpress SARS-CoV-2/FLU/RSV plus assay is intended as an aid in the diagnosis of influenza from Nasopharyngeal swab specimens and should not be used as a sole basis for treatment. Nasal washings and aspirates are unacceptable for Xpert Xpress SARS-CoV-2/FLU/RSV testing.  Fact Sheet for Patients: BloggerCourse.com  Fact Sheet for Healthcare Providers: SeriousBroker.it  This test is not yet approved or cleared by the [place] FDA and has been authorized for detection and/or diagnosis of SARS-CoV-2 by FDA under an Emergency Use Authorization (EUA). This EUA will remain in effect (meaning this  test can be used) for the duration of the COVID-19 declaration under Section 564(b)(1) of the Act, 21 U.S.C. section 360bbb-3(b)(1), unless the authorization is terminated or revoked.  Performed at [facility], [address], [place], [place] [address]   Urine Culture     Status: None   Collection Time: 07/09/21 10:46 AM    Specimen: Urine, Clean Catch  Result Value Ref Range Status   Specimen Description URINE, CLEAN CATCH  Final   Special Requests NONE  Final   Culture   Final    NO GROWTH Performed at [facility], [address] [place], [place] [address]    Report Status 07/11/2021 FINAL  Final  Culture, blood (routine x 2)     Status: None (Preliminary result)   Collection Time: 07/09/21 10:53 AM   Specimen: BLOOD  Result Value Ref Range Status   Specimen Description BLOOD LEFT ANTECUBITAL  Final   Special Requests   Final    BOTTLES DRAWN AEROBIC AND ANAEROBIC Blood Culture results may not be optimal due to an inadequate volume of blood received in culture bottles   Culture   Final    NO GROWTH 2 DAYS Performed at [facility], [address], [place], [place] [address]    Report Status PENDING  Incomplete  Culture, blood (routine x 2)     Status: None (Preliminary result)   Collection Time: 07/09/21 10:53 AM   Specimen: BLOOD  Result Value Ref Range Status   Specimen Description BLOOD RIGHT ANTECUBITAL  Final   Special Requests   Final    BOTTLES DRAWN AEROBIC AND ANAEROBIC Blood Culture adequate volume   Culture   Final    NO GROWTH 2 DAYS Performed at [facility], [address], [place], [place] [address]    Report Status PENDING  Incomplete     Labs: BNP (last 3 results) No results for input(s): BNP in the last 8760 hours. Basic Metabolic Panel: Recent Labs  Lab 07/06/21 1639 07/06/21 2231 07/07/21 0257 07/11/21 0724  NA 137  --  137 136  K 4.3  --  3.9 4.5  CL 106  --  103 106  CO2 24  --  25 21*  GLUCOSE 83  --  103* 110*  BUN 15  --  13 18  CREATININE 0.83  --  0.66 0.67  CALCIUM 8.9  --  9.1 9.3  MG  --  1.9 1.9  --    Liver Function Tests: Recent Labs  Lab 07/07/21 0257  AST 18  ALT 9  ALKPHOS 67  BILITOT 0.6  PROT 6.1*  ALBUMIN 2.9*   No results for input(s): LIPASE, AMYLASE in the last 168 hours. No results for input(s): AMMONIA in  the last 168 hours. CBC: Recent Labs  Lab 07/06/21 1639 07/07/21 0257  WBC 8.0 7.2  NEUTROABS 4.8  --   HGB 13.4 12.4  HCT 41.7 37.3  MCV 91.0 87.8  PLT 241 226   Cardiac Enzymes: No results for input(s): CKTOTAL, CKMB, CKMBINDEX, TROPONINI in the last 168 hours. BNP: Invalid input(s): POCBNP CBG: Recent Labs  Lab 07/11/21 1221 07/11/21 1559  GLUCAP 122* 89   D-Dimer No results for input(s): DDIMER in the last 72 hours. Hgb A1c No results for input(s): HGBA1C in the last 72 hours. Lipid Profile No results for input(s): CHOL, HDL, LDLCALC, TRIG, CHOLHDL, LDLDIRECT in the last 72 hours. Thyroid function studies No results for input(s):  TSH, T4TOTAL, T3FREE, THYROIDAB in the last 72 hours.  Invalid input(s): FREET3 Anemia work up No results for input(s): VITAMINB12, FOLATE, FERRITIN, TIBC, IRON, RETICCTPCT in the last 72 hours. Urinalysis    Component Value Date/Time   COLORURINE YELLOW 07/10/2021 0733   APPEARANCEUR HAZY (A) 07/10/2021 0733   LABSPEC 1.012 07/10/2021 0733   PHURINE 5.0 07/10/2021 0733   GLUCOSEU NEGATIVE 07/10/2021 0733   GLUCOSEU NEGATIVE 02/05/2020 0857   HGBUR SMALL (A) 07/10/2021 0733   BILIRUBINUR NEGATIVE 07/10/2021 0733   KETONESUR NEGATIVE 07/10/2021 0733   PROTEINUR NEGATIVE 07/10/2021 0733   UROBILINOGEN 0.2 02/05/2020 0857   NITRITE NEGATIVE 07/10/2021 0733   LEUKOCYTESUR LARGE (A) 07/10/2021 0733   Sepsis Labs Invalid input(s): PROCALCITONIN,  WBC,  LACTICIDVEN Microbiology Recent Results (from the past 240 hour(s))  Resp Panel by RT-PCR (Flu A&B, Covid) Nasopharyngeal Swab     Status: None   Collection Time: 07/06/21  5:02 PM   Specimen: Nasopharyngeal Swab; Nasopharyngeal(NP) swabs in vial transport medium  Result Value Ref Range Status   SARS Coronavirus 2 by RT PCR NEGATIVE NEGATIVE Final    Comment: (NOTE) SARS-CoV-2 target nucleic acids are NOT DETECTED.  The SARS-CoV-2 RNA is generally detectable in upper  respiratory specimens during the acute phase of infection. The lowest concentration of SARS-CoV-2 viral copies this assay can detect is 138 copies/mL. A negative result does not preclude SARS-Cov-2 infection and should not be used as the sole basis for treatment or other patient management decisions. A negative result may occur with  improper specimen collection/handling, submission of specimen other than nasopharyngeal swab, presence of viral mutation(s) within the areas targeted by this assay, and inadequate number of viral copies(<138 copies/mL). A negative result must be combined with clinical observations, patient history, and epidemiological information. The expected result is Negative.  Fact Sheet for Patients:  BloggerCourse.com  Fact Sheet for Healthcare Providers:  SeriousBroker.it  This test is no t yet approved or cleared by the [place] FDA and  has been authorized for detection and/or diagnosis of SARS-CoV-2 by FDA under an Emergency Use Authorization (EUA). This EUA will remain  in effect (meaning this test can be used) for the duration of the COVID-19 declaration under Section 564(b)(1) of the Act, 21 U.S.C.section 360bbb-3(b)(1), unless the authorization is terminated  or revoked sooner.       Influenza A by PCR NEGATIVE NEGATIVE Final   Influenza B by PCR NEGATIVE NEGATIVE Final    Comment: (NOTE) The Xpert Xpress SARS-CoV-2/FLU/RSV plus assay is intended as an aid in the diagnosis of influenza from Nasopharyngeal swab specimens and should not be used as a sole basis for treatment. Nasal washings and aspirates are unacceptable for Xpert Xpress SARS-CoV-2/FLU/RSV testing.  Fact Sheet for Patients: BloggerCourse.com  Fact Sheet for Healthcare Providers: SeriousBroker.it  This test is not yet approved or cleared by the [place] FDA and has been  authorized for detection and/or diagnosis of SARS-CoV-2 by FDA under an Emergency Use Authorization (EUA). This EUA will remain in effect (meaning this test can be used) for the duration of the COVID-19 declaration under Section 564(b)(1) of the Act, 21 U.S.C. section 360bbb-3(b)(1), unless the authorization is terminated or revoked.  Performed at [facility], [address] [place], [place] [address]   Urine Culture     Status: None   Collection Time: 07/09/21 10:46 AM   Specimen: Urine, Clean Catch  Result Value Ref Range Status   Specimen Description URINE, CLEAN CATCH  Final   Special Requests NONE  Final   Culture   Final    NO GROWTH Performed at [facility], [address] [place], [place] [address]    Report Status 07/11/2021 FINAL  Final  Culture, blood (routine x 2)     Status: None (Preliminary result)   Collection Time: 07/09/21 10:53 AM   Specimen: BLOOD  Result Value Ref Range Status   Specimen Description BLOOD LEFT ANTECUBITAL  Final   Special Requests   Final    BOTTLES DRAWN AEROBIC AND ANAEROBIC Blood Culture results may not be optimal due to an inadequate volume of blood received in culture bottles   Culture   Final    NO GROWTH 2 DAYS Performed at [facility], [address] [place], [place] [address]    Report Status PENDING  Incomplete  Culture, blood (routine x 2)     Status: None (Preliminary result)   Collection Time: 07/09/21 10:53 AM   Specimen: BLOOD  Result Value Ref Range Status   Specimen Description BLOOD RIGHT ANTECUBITAL  Final   Special Requests   Final    BOTTLES DRAWN AEROBIC AND ANAEROBIC Blood Culture adequate volume   Culture   Final    NO GROWTH 2 DAYS Performed at [facility], [address], [place], [place] [address]    Report Status PENDING  Incomplete     Time coordinating discharge: The [place] controlled substances registry was reviewed for this patient     30 Day Unplanned  Readmission Risk Score    Flowsheet Row ED to Hosp-Admission (Discharged) from 07/06/2021 in [facility]  30 Day Unplanned Readmission Risk Score (%) 11.08 Filed at 07/11/2021 1600       This score is the patient's risk of an unplanned readmission within 30 days of being discharged (0 -100%). The score is based on dignosis, age, lab data, medications, orders, and past utilization.   Low:  0-14.9   Medium: 15-21.9   High: 22-29.9   Extreme: 30 and above            SIGNED:   Alberteen Sam, MD  [facility] 07/11/2021, 9:13 PM

## 2021-07-11 NOTE — Plan of Care (Signed)
  Problem: Education: Goal: Knowledge of General Education information will improve Description: Including pain rating scale, medication(s)/side effects and non-pharmacologic comfort measures 07/11/2021 1808 by Drue Dun, RN Outcome: Adequate for Discharge 07/11/2021 [phone] by Drue Dun, RN Outcome: Progressing   Problem: Health Behavior/Discharge Planning: Goal: Ability to manage health-related needs will improve 07/11/2021 1808 by Drue Dun, RN Outcome: Adequate for Discharge 07/11/2021 [phone] by Drue Dun, RN Outcome: Progressing   Problem: Clinical Measurements: Goal: Ability to maintain clinical measurements within normal limits will improve 07/11/2021 1808 by Drue Dun, RN Outcome: Adequate for Discharge 07/11/2021 [phone] by Drue Dun, RN Outcome: Progressing Goal: Will remain free from infection 07/11/2021 1808 by Drue Dun, RN Outcome: Adequate for Discharge 07/11/2021 [phone] by Drue Dun, RN Outcome: Progressing Goal: Diagnostic test results will improve 07/11/2021 1808 by Drue Dun, RN Outcome: Adequate for Discharge 07/11/[phone] by Drue Dun, RN Outcome: Progressing Goal: Respiratory complications will improve 07/11/2021 1808 by Drue Dun, RN Outcome: Adequate for Discharge 07/11/[phone] by Drue Dun, RN Outcome: Progressing Goal: Cardiovascular complication will be avoided 07/11/2021 1808 by Drue Dun, RN Outcome: Adequate for Discharge 07/11/2021 [phone] by Drue Dun, RN Outcome: Progressing   Problem: Activity: Goal: Risk for activity intolerance will decrease 07/11/2021 1808 by Drue Dun, RN Outcome: Adequate for Discharge 07/11/2021 [phone] by Drue Dun, RN Outcome: Progressing   Problem: Nutrition: Goal: Adequate nutrition will be maintained 07/11/2021 1808 by Drue Dun, RN Outcome: Adequate for Discharge 07/11/2021 [phone] by Drue Dun, RN Outcome:  Progressing   Problem: Coping: Goal: Level of anxiety will decrease 07/11/2021 1808 by Drue Dun, RN Outcome: Adequate for Discharge 07/11/2021 [phone] by Drue Dun, RN Outcome: Progressing   Problem: Elimination: Goal: Will not experience complications related to bowel motility 07/11/2021 1808 by Drue Dun, RN Outcome: Adequate for Discharge 07/11/2021 [phone] by Drue Dun, RN Outcome: Progressing Goal: Will not experience complications related to urinary retention 07/11/2021 1808 by Drue Dun, RN Outcome: Adequate for Discharge 07/11/2021 [phone] by Drue Dun, RN Outcome: Progressing   Problem: Pain Managment: Goal: General experience of comfort will improve 07/11/2021 1808 by Drue Dun, RN Outcome: Adequate for Discharge 07/11/[phone] by Drue Dun, RN Outcome: Progressing   Problem: Safety: Goal: Ability to remain free from injury will improve 07/11/2021 1808 by Drue Dun, RN Outcome: Adequate for Discharge 07/11/[phone] by Drue Dun, RN Outcome: Progressing   Problem: Skin Integrity: Goal: Risk for impaired skin integrity will decrease 07/11/2021 1808 by Drue Dun, RN Outcome: Adequate for Discharge 07/11/2021 [phone] by Drue Dun, RN Outcome: Progressing

## 2021-07-13 ENCOUNTER — Telehealth: Payer: Self-pay | Admitting: Internal Medicine

## 2021-07-13 ENCOUNTER — Telehealth: Payer: Self-pay

## 2021-07-13 NOTE — Telephone Encounter (Signed)
Caller/Agency: [facility] Callback Number: [phone] Requesting OT/PT/Skilled Nursing/Social Work/Speech Therapy: PT Frequency: 1x week for 8weeks  Also: Pt pain is still high and doesn't get out of bed. She still on oxycodone every 6hrs as needed. Any other tips for pain management?

## 2021-07-13 NOTE — Telephone Encounter (Signed)
Transition Care Management Unsuccessful Follow-up Telephone Call  Date of discharge and from where:  07/11/2021-[place]  Attempts:  1st Attempt  Reason for unsuccessful TCM follow-up call:  No answer/busy

## 2021-07-13 NOTE — Telephone Encounter (Signed)
Pt is returning call left earlier.

## 2021-07-13 NOTE — Telephone Encounter (Signed)
Transition Care Management Unsuccessful Follow-up Telephone Call  Date of discharge and from where:  07/11/2021-[place]  Attempts:  2nd Attempt  Reason for unsuccessful TCM follow-up call:  Left voice message

## 2021-07-13 NOTE — Telephone Encounter (Signed)
Please advise 

## 2021-07-13 NOTE — Telephone Encounter (Signed)
She is currently taking hydrocodone 5/325 mg 1 tablet every 6 hours. Recommend to take 2 tablets every 8 hours.  See if that helps better. Watch for drowsiness.

## 2021-07-13 NOTE — Telephone Encounter (Signed)
Spoke w/ Gerarda Gunther- informed of recommendations. Verbal orders given for PT.

## 2021-07-14 LAB — CULTURE, BLOOD (ROUTINE X 2)
Culture: NO GROWTH
Culture: NO GROWTH
Special Requests: ADEQUATE

## 2021-07-15 ENCOUNTER — Telehealth: Payer: Self-pay | Admitting: Internal Medicine

## 2021-07-15 ENCOUNTER — Telehealth: Payer: Self-pay

## 2021-07-15 NOTE — Telephone Encounter (Signed)
Patient's daughter declined a virtual visit.

## 2021-07-15 NOTE — Telephone Encounter (Signed)
Transition Care Management Follow-up Telephone Call Date of discharge and from where: 07/11/2021-[place] How have you been since you were released from the hospital? Per daughter, pt having some pain. Taking pain meds & has caregivers  n the home to care for her. Has home health coming in for PT. Any questions or concerns? No  Items Reviewed: Did the pt receive and understand the discharge instructions provided? Yes  Medications obtained and verified? Yes  Other? Yes  Any new allergies since your discharge? No  Dietary orders reviewed? Yes Do you have support at home? Yes   Home Care and Equipment/Supplies: Were home health services ordered? yes If so, what is the name of the agency? [facility]  Has the agency set up a time to come to the patient's home? yes Were any new equipment or medical supplies ordered?  No What is the name of the medical supply agency? N/a Were you able to get the supplies/equipment? not applicable Do you have any questions related to the use of the equipment or supplies? N/a  Functional Questionnaire: (I = Independent and D = Dependent) ADLs: D  Bathing/Dressing- D  Meal Prep- D  Eating- I  Maintaining continence- I  Transferring/Ambulation- I WITH ASSISTANCE  Managing Meds- D  Follow up appointments reviewed:  PCP Hospital f/u appt confirmed? No  Patient's daughter declined appt at this time Specialist Hospital f/u appt confirmed?  N/a   Are transportation arrangements needed? N/a If their condition worsens, is the pt aware to call PCP or go to the Emergency Dept.? Yes Was the patient provided with contact information for the PCP's office or ED? Yes Was to pt encouraged to call back with questions or concerns? Yes

## 2021-07-15 NOTE — Telephone Encounter (Signed)
Caller: kayla from [facility] Call back: [phone]  Needing verbal orders for therapy

## 2021-07-15 NOTE — Telephone Encounter (Signed)
LMOM for Kayla w/ verbal orders.

## 2021-07-18 ENCOUNTER — Telehealth: Payer: Self-pay | Admitting: Internal Medicine

## 2021-07-18 MED ORDER — HYDROCODONE-ACETAMINOPHEN 5-325 MG PO TABS: 1.0000 | ORAL_TABLET | Freq: Three times a day (TID) | ORAL | 0 refills | Status: DC | PRN

## 2021-07-18 NOTE — Telephone Encounter (Signed)
Please advise 

## 2021-07-18 NOTE — Telephone Encounter (Signed)
LMOM [name] of recommendations.

## 2021-07-18 NOTE — Telephone Encounter (Signed)
PDMP: Received 24 tablets on 07/11/2021, she is probably taking 4 tablets daily. Advise patient or her family: Decrease pain medicines to every 8 hours. Rx sent

## 2021-07-18 NOTE — Telephone Encounter (Signed)
Patient's daughter states that due to PT they had to double up on her mom's hydrocodone, so she needs it to be refilled.   Medication: HYDROcodone-acetaminophen (NORCO/VICODIN) 5-325 MG tablet  Has the patient contacted their pharmacy? No. (If no, request that the patient contact the pharmacy for the refill.) (If yes, when and what did the pharmacy advise?)  Preferred Pharmacy (with phone number or street name): [id number] [place], [place] - [address]  [address], [place] [address]  Phone:  [phone]  Fax:  [phone]  Agent: Please be advised that RX refills may take up to 3 business days. We ask that you follow-up with your pharmacy.

## 2021-08-01 ENCOUNTER — Telehealth: Payer: Self-pay | Admitting: Internal Medicine

## 2021-08-01 NOTE — Telephone Encounter (Signed)
Caller/Agency: [facility] Callback Number: [phone] Requesting OT/PT/Skilled Nursing/Social Work/Speech Therapy: PT  Frequency:  1x week for 4weeks beginning the week of 9/19

## 2021-08-01 NOTE — Telephone Encounter (Signed)
Spoke w/ Cecelia- verbal orders given.

## 2021-08-17 ENCOUNTER — Telehealth: Payer: Self-pay

## 2021-08-17 DIAGNOSIS — Z8673 Personal history of transient ischemic attack (TIA), and cerebral infarction without residual deficits: Secondary | ICD-10-CM

## 2021-08-17 DIAGNOSIS — Z90710 Acquired absence of both cervix and uterus: Secondary | ICD-10-CM

## 2021-08-17 DIAGNOSIS — Z87891 Personal history of nicotine dependence: Secondary | ICD-10-CM

## 2021-08-17 DIAGNOSIS — E119 Type 2 diabetes mellitus without complications: Secondary | ICD-10-CM

## 2021-08-17 DIAGNOSIS — F0280 Dementia in other diseases classified elsewhere without behavioral disturbance: Secondary | ICD-10-CM

## 2021-08-17 DIAGNOSIS — I10 Essential (primary) hypertension: Secondary | ICD-10-CM

## 2021-08-17 DIAGNOSIS — E039 Hypothyroidism, unspecified: Secondary | ICD-10-CM

## 2021-08-17 DIAGNOSIS — Z79891 Long term (current) use of opiate analgesic: Secondary | ICD-10-CM

## 2021-08-17 DIAGNOSIS — G250 Essential tremor: Secondary | ICD-10-CM

## 2021-08-17 DIAGNOSIS — Z9181 History of falling: Secondary | ICD-10-CM

## 2021-08-17 DIAGNOSIS — I480 Paroxysmal atrial fibrillation: Secondary | ICD-10-CM

## 2021-08-17 DIAGNOSIS — E785 Hyperlipidemia, unspecified: Secondary | ICD-10-CM

## 2021-08-17 DIAGNOSIS — W19XXXD Unspecified fall, subsequent encounter: Secondary | ICD-10-CM | POA: Diagnosis not present

## 2021-08-17 DIAGNOSIS — M8008XD Age-related osteoporosis with current pathological fracture, vertebra(e), subsequent encounter for fracture with routine healing: Secondary | ICD-10-CM | POA: Diagnosis not present

## 2021-08-17 NOTE — Telephone Encounter (Signed)
Plan of care signed and faxed back to [facility] at [phone]. Form sent for scanning.  °

## 2021-08-30 ENCOUNTER — Telehealth: Payer: Self-pay | Admitting: Internal Medicine

## 2021-08-30 NOTE — Telephone Encounter (Signed)
If she has symptoms okay to proceed. If she does not have symptoms I do not see a reason for it.

## 2021-08-30 NOTE — Telephone Encounter (Signed)
Please advise 

## 2021-08-30 NOTE — Telephone Encounter (Signed)
Will the RN from [facility] would like orders for a PCR covid test for the patient. He states that the patient would like to be tested for covid, but they can only do it if they have an order. Order can be faxed to [phone]. Will can be reached at [phone]. Please advise.

## 2021-08-30 NOTE — Telephone Encounter (Signed)
Spoke w/ Amy Weiss- informed Pt is having symptoms. Amy Weiss fax order to [facility].

## 2021-09-02 ENCOUNTER — Telehealth: Payer: Self-pay | Admitting: Internal Medicine

## 2021-09-02 MED ORDER — MOLNUPIRAVIR EUA 200MG CAPSULE: 4.0000 | ORAL_CAPSULE | Freq: Two times a day (BID) | ORAL | 0 refills | Status: AC

## 2021-09-02 NOTE — Telephone Encounter (Signed)
Send Rx for Molnupiravir, needs to start today Tylenol as needed Robitussin-DM for cough Monitor symptoms and oxygen saturation if possible. If symptoms severe, oxygen less than 94%: ER.

## 2021-09-02 NOTE — Telephone Encounter (Signed)
Please advise 

## 2021-09-02 NOTE — Telephone Encounter (Signed)
Amy Weiss- [facility]  Amy administered the Pt yesterday and wants to inform Amy Weiss that the Pt Covid test came back positive. Symptoms began Monday 10.10. He would like recommendations on what to do next. Please advise. [phone]

## 2021-09-02 NOTE — Telephone Encounter (Signed)
Spoke w/ Will- informed of recommendations. Will verbalized understanding, he will contact [name]. [facility]

## 2021-09-12 ENCOUNTER — Telehealth: Payer: Self-pay | Admitting: Internal Medicine

## 2021-09-12 NOTE — Telephone Encounter (Signed)
RN from hospice care called to make [facility] aware of patients current conditions and to ask for a urine test for a possible UTI. RN states that the patient has had a significant function decline since covid, increased back pain, plus a possible new compression fracture but patient is refusing to go to the ER to be evaluated. RN would also like to make Paz aware that adult protective services has been called. RN states that the patient is completely bed bound now, and that she is complaining of burning while urinating. Daughter of patient would like to know if she can be prescribed antibiotics for it or to see if she can be tested for it. Please advice.

## 2021-09-12 NOTE — Telephone Encounter (Signed)
Ask home health to get a UA, urine culture and send me the results when they are available.  DX UTI

## 2021-09-12 NOTE — Telephone Encounter (Signed)
LMOM for Tammy, RN w/ verbal orders UA and urine culture, asked that she fax results to [place] when available.

## 2021-09-13 ENCOUNTER — Encounter: Payer: Self-pay | Admitting: Internal Medicine

## 2021-09-14 MED ORDER — AMOXICILLIN-POT CLAVULANATE 500-125 MG PO TABS: 1.0000 | ORAL_TABLET | Freq: Three times a day (TID) | ORAL | 0 refills | Status: DC

## 2021-09-14 NOTE — Telephone Encounter (Signed)
UA collected 09/13/2021 show bacteriuria, WBCs. She has symptoms.  Allergic to Bactrim. Urine culture pending Plan: Augmentin 500 mg 1 p.o. 3 times daily #15

## 2021-09-14 NOTE — Telephone Encounter (Signed)
Rx sent. Tammy,RN at hospice informed- she will inform Pt's family.

## 2021-09-14 NOTE — Telephone Encounter (Signed)
Received UA results, placed in PCP red folder. Urine culture pending.

## 2021-09-14 NOTE — Addendum Note (Signed)
Addended byConrad [place] D on: 09/14/2021 03:24 PM   Modules accepted: Orders

## 2021-09-16 NOTE — Telephone Encounter (Signed)
LMOM for Amy Weiss w/ PCP recommendations.

## 2021-09-16 NOTE — Telephone Encounter (Signed)
Urine culture reviewed with. E. coli, sensitive to Augmentin. Let patient's caregiver know.  Finish antibiotics, call if not better

## 2021-09-19 ENCOUNTER — Telehealth: Payer: Self-pay

## 2021-09-19 NOTE — Telephone Encounter (Signed)
Orders signed and mailed back in envelope provided to [facility]. Copy of forms sent for scanning.

## 2021-09-29 ENCOUNTER — Telehealth: Payer: Self-pay

## 2021-09-29 NOTE — Telephone Encounter (Signed)
Received fax from [facility] [place]. Forms completed and last 3 OV notes faxed back to them at [phone]. Form sent for scanning.

## 2021-09-29 NOTE — Telephone Encounter (Signed)
Received fax confirmation. Will hold form for several days before sending to scan.

## 2021-10-10 ENCOUNTER — Telehealth: Payer: Self-pay | Admitting: Internal Medicine

## 2021-10-10 NOTE — Telephone Encounter (Signed)
Request from Will (from hospice ) noted. - Okay to check a TSH, send results to me - Okay to increase Lexapro 5 mg from half tablet daily to 1 tablet daily

## 2021-10-10 NOTE — Telephone Encounter (Signed)
Will- [facility] [phone]  Pt. Continues to be bed bound. Isnt willing to go to hospital so will probably remain bed bound. Also APS continues to be involved as well. Wanting to see if changing Lexapro dosage might be of some help to pt. Wanting to see about getting pt thyroid levels checked as well.

## 2021-10-10 NOTE — Telephone Encounter (Signed)
Spoke w/ Will- orders given. Med list updated. He will fax TSH results once received.

## 2021-10-10 NOTE — Telephone Encounter (Signed)
Please advise 

## 2021-10-17 ENCOUNTER — Telehealth: Payer: Self-pay

## 2021-10-17 NOTE — Telephone Encounter (Signed)
Received call from Marylene Land w/ [employer]- requesting call back regarding Pt at her direct number- [phone]

## 2021-10-17 NOTE — Telephone Encounter (Signed)
Spoke with Marylene Land, Child psychotherapist for [employer] [facility]. She has been in contact with the  "[facility]" nurse that visited the patient weekly and also the patient's daughter [name]. ("[facility]" is affiliated to hospice but is not hospice itself.  Apparently the patient is permanently bedbound. She is spends many hours by herself, Marylene Land noted that food/water is available but she does not know if the patient is able to reach for it or if she is motivated enough to eat when she is by herself.  There is nobody at night with her, one of the concerns is that she may not be able to operate a cell phone to call for help b/c she is confused at times.  At this point Marylene Land and me  agreed that the ideal situation would be for her to be supervised 24/7 however [name] thinks that the situation is under control, she has cameras and she watch her mother remotely she said.Marylene Land plans to reassess the situation next week and let me know if something needs to be done.

## 2021-10-21 ENCOUNTER — Telehealth: Payer: Self-pay | Admitting: Internal Medicine

## 2021-10-21 NOTE — Telephone Encounter (Signed)
Pt has had another back injury.  Medication: HYDROcodone-acetaminophen (NORCO/VICODIN) 5-325 MG tablet   Has the patient contacted their pharmacy? No. (If no, request that the patient contact the pharmacy for the refill.) (If yes, when and what did the pharmacy advise?)  Preferred Pharmacy (with phone number or street name):  [id number] [place], [place] - [address]  [address], [place] [address]  Phone:  [phone]  Fax:  [phone]   Agent: Please be advised that RX refills may take up to 3 business days. We ask that you follow-up with your pharmacy.

## 2021-10-21 NOTE — Telephone Encounter (Signed)
Pt daughter called in and stated pt contracted covid on 10/10. Her symptoms were dizziness and weakness. Ever since contracting COVID she has had this cycle of being on and off bed ridden. Her daughter states after COVID is when she started the habit. She just wants Dr. Drue Novel to be updated regarding this.

## 2021-10-24 MED ORDER — HYDROCODONE-ACETAMINOPHEN 5-325 MG PO TABS: 1.0000 | ORAL_TABLET | Freq: Three times a day (TID) | ORAL | 0 refills | Status: AC | PRN

## 2021-10-24 NOTE — Telephone Encounter (Signed)
PCP aware- he has spoken w/ APS.

## 2021-10-24 NOTE — Telephone Encounter (Signed)
Requesting: hydrocodone 5-325mg  Contract: None UDS: None Last Visit: 06/10/2021 Next Visit: None Last Refill: 07/18/2021 #30 and 0RF  Please Advise

## 2021-10-24 NOTE — Telephone Encounter (Signed)
PDMP okay, Rx sent 

## 2021-11-07 ENCOUNTER — Telehealth: Payer: Self-pay | Admitting: Internal Medicine

## 2021-11-07 NOTE — Telephone Encounter (Signed)
Pt daughter called regarding situation still being something that's present. Believes mother is unsafe being at home alone and would to see about further options for mother. She would like a call to discuss further.    [phone]

## 2021-11-07 NOTE — Telephone Encounter (Signed)
Error

## 2021-11-07 NOTE — Telephone Encounter (Signed)
Spoke w/ [name]- recommended she contact [facility] they should have a Child psychotherapist that can sit down with Pt's family to discuss options- informed unfortunately it is not easy to place orders for in patient rehab or assisted living in the outpatient setting. [name] verbalized understanding.

## 2021-11-28 NOTE — Telephone Encounter (Signed)
[facility] rep is calling stating the patient's daughter is wanting to get the patient placed at [facility] (or others). Rep would like to know if they fill out the FL2 form would Dr. Drue Novel be able to sign it or if he needs to see her first. Please advice.  Call back: [phone] [facility]

## 2021-11-28 NOTE — Telephone Encounter (Signed)
Set up a virtual visit  

## 2021-11-28 NOTE — Telephone Encounter (Signed)
Please advise 

## 2021-11-28 NOTE — Telephone Encounter (Signed)
LMOM for Amy Weiss, informed that PCP requesting virtual visit- asked that she have Pt's family call to schedule virtual visit at their convenience.

## 2021-12-07 ENCOUNTER — Telehealth: Payer: Self-pay | Admitting: Internal Medicine

## 2021-12-07 NOTE — Telephone Encounter (Signed)
Will discuss at time of OV on 12/09/21.

## 2021-12-07 NOTE — Telephone Encounter (Signed)
Pt has an upcoming appt 1/20 vv @ 220 for follow up// fl2 papers. Pt daughter stated pt has dementia, she thinks she's walking and cleaning the home. However, she has been primarily bed ridden since 08/29/21. Pt daughter would like to know if rehab is recommended. Please advise.

## 2021-12-09 ENCOUNTER — Telehealth: Payer: Self-pay

## 2021-12-09 ENCOUNTER — Encounter: Payer: Self-pay | Admitting: Internal Medicine

## 2021-12-09 ENCOUNTER — Telehealth (INDEPENDENT_AMBULATORY_CARE_PROVIDER_SITE_OTHER): Payer: Medicare Other | Admitting: Internal Medicine

## 2021-12-09 VITALS — BP 136/68 | HR 43 | Ht 67.0 in | Wt 150.0 lb

## 2021-12-09 DIAGNOSIS — F339 Major depressive disorder, recurrent, unspecified: Secondary | ICD-10-CM | POA: Diagnosis not present

## 2021-12-09 DIAGNOSIS — F0390 Unspecified dementia without behavioral disturbance: Secondary | ICD-10-CM

## 2021-12-09 DIAGNOSIS — M810 Age-related osteoporosis without current pathological fracture: Secondary | ICD-10-CM

## 2021-12-09 DIAGNOSIS — E039 Hypothyroidism, unspecified: Secondary | ICD-10-CM | POA: Diagnosis not present

## 2021-12-09 NOTE — Telephone Encounter (Signed)
[facility] for [facility] Billings [phone]) at [place]- asking for TSH dx: hypothyroidism. Also PCP wanted to see if they are able to do tdap, flu shot and Prevnar 20 immunizations from home. Asked for call back at her convenience.

## 2021-12-09 NOTE — Telephone Encounter (Signed)
FL2 form completed today during virtual office visit. Form emailed to her daughter [name] at [email or web address] as requested. FL2 form sent for scanning.

## 2021-12-09 NOTE — Progress Notes (Signed)
Subjective:    Patient ID: Amy Weiss, female    DOB: 02/13/1935, 87 y.o.   MRN: [id number]  DOS:  12/09/2021 Type of visit - description: Virtual Visit via Video Note  I connected with the above patient  by a video enabled telemedicine application and verified that I am speaking with the correct person using two identifiers.   THIS ENCOUNTER IS A VIRTUAL VISIT DUE TO COVID-19 - PATIENT WAS NOT SEEN IN THE OFFICE. PATIENT HAS CONSENTED TO VIRTUAL VISIT / TELEMEDICINE VISIT   Location of patient: home  Location of provider: office  Persons participating in the virtual visit: patient, provider   I discussed the limitations of evaluation and management by telemedicine and the availability of in person appointments. The patient expressed understanding and agreed to proceed.  Acute The patient requested FL 2, family is thinking about going to another level of care. All appropriate questions were answered to fill out the paperwork. Back in October she had a mild case of COVID I am told, after that she was completely bed-ridden until Christmas when she started to get up and go to the bathroom only. No pressure ulcers that the family can tell. Taking pain medication as needed only and always along with MiraLAX to prevent constipation.  Review of Systems See above   Past Medical History:  Diagnosis Date   Diabetes mellitus 4/09   A1C-6   Familial tremor    Hyperlipidemia    Hypothyroidism    Osteoporosis    Tachycardia    AV Re-entry, s/p ablation aprox 2005   TIA (transient ischemic attack) 12/09    Past Surgical History:  Procedure Laterality Date   ABDOMINAL HYSTERECTOMY     no oophorectomy   BLADDER SURGERY     x 2 in the 80s   BREAST BIOPSY     L (-)   IR KYPHO EA ADDL LEVEL THORACIC OR LUMBAR  12/29/2019   IR KYPHO LUMBAR INC FX REDUCE BONE BX UNI/BIL CANNULATION INC/IMAGING  01/06/2020   IR KYPHO LUMBAR INC FX REDUCE BONE BX UNI/BIL CANNULATION INC/IMAGING   06/01/2021   IR KYPHO THORACIC WITH BONE BIOPSY  12/29/2019   IR KYPHO THORACIC WITH BONE BIOPSY  02/02/2020   IR VERTEBROPLASTY LUMBAR BX INC UNI/BIL INC/INJECT/IMAGING  07/08/2021    Current Outpatient Medications  Medication Instructions   acetaminophen (TYLENOL) 500 mg, Oral, 2 times daily   amoxicillin-clavulanate (AUGMENTIN) 500-125 MG tablet 500 mg, Oral, 3 times daily   aspirin EC 81 mg, Oral, Every morning   diclofenac Sodium (VOLTAREN) 2 g, Topical, 4 times daily   escitalopram (LEXAPRO) 5 mg, Oral, Daily   HYDROcodone-acetaminophen (NORCO/VICODIN) 5-325 MG tablet 1 tablet, Oral, 3 times daily PRN   levothyroxine (SYNTHROID) 88 MCG tablet TAKE 1 TABLET BY MOUTH DAILY BEFORE BREAKFAST.   OVER THE COUNTER MEDICATION Comfrey gel   polyethylene glycol (MIRALAX / GLYCOLAX) 17 g, Oral, Daily PRN       Objective:   Physical Exam BP 136/68    Pulse (!) 43    Ht 5\' 7"  (1.702 m)    Wt 150 lb (68 kg)    BMI 23.49 kg/m  This is a virtual video visit, participants were the patient and her daughter [name]. The patient is alert, she seems in good spirits.  Oriented to self, recognize her daughter,not oriented in space or time.     Assessment       Assessment Prediabetes   dx 2009 >>>  A1c 6.0 Hypothyroidism TIA 2009 MSK: --DJD  --Osteoporosis (started Fosamax 01-2020 after fracture) ---Vertebral fracture 12-2019, kyphoplasty, admitted for pain control Familial tremor CV: see cardiology at [facility] ---AV reentry tachycardia, ablation 2005 ---Bradycardic , Mobitz I , junctional escapes -- saw cards @ [facility], f/u 1 year, pt decided not to go back History of pressure ulcers Dementia (symptoms noticeable started 12-2019)   PLAN Bed ridden: Patient with multiple medical problems,   currently living at home, searching for a facility.  FL 2 completed. Social: Has a caregiver morning and afternoon, at night she is by herself. Also gets help from [facility]". Daughter [name] is  the main caregiver.   Social situation was assessed about APS in November.  See phone note. Hypothyroidism: Last TSH more than a year ago, good compliance with Synthroid, will ask [facility] to possibly draw labs. Osteoporosis, multiple vertebral fractures: Unable to treat, see previous entries.  Pain management: Currently takes hydrocodone sporadically. Dementia: On no medication at this point. Depression: On Lexapro, no major problems with depression or anxiety H/o TIA: On aspirin. Preventive care: Ideally she would need Tdap, pneumonia shot, flu shot, COVID-vaccine.  We will ask [facility] to see if that is possible.   Time spent 30 min, assessing the pt, speaking w/ her daughter, preparing paper work   I discussed the assessment and treatment plan with the patient. The patient was provided an opportunity to ask questions and all were answered. The patient agreed with the plan and demonstrated an understanding of the instructions.   The patient was advised to call back or seek an in-person evaluation if the symptoms worsen or if the condition fails to improve as anticipated.

## 2021-12-11 DIAGNOSIS — F339 Major depressive disorder, recurrent, unspecified: Secondary | ICD-10-CM | POA: Insufficient documentation

## 2021-12-11 NOTE — Assessment & Plan Note (Signed)
Bed ridden: Patient with multiple medical problems,   currently living at home, searching for a facility.  FL 2 completed. Social: Has a caregiver morning and afternoon, at night she is by herself. Also gets help from '[facility]". Daughter [name] is the main caregiver.   Social situation was assessed about APS in November.  See phone note. Hypothyroidism: Last TSH more than a year ago, good compliance with Synthroid, will ask [facility] to possibly draw labs. Osteoporosis, multiple vertebral fractures: Unable to treat, see previous entries.  Pain management: Currently takes hydrocodone sporadically. Dementia: On no medication at this point. Depression: On Lexapro, no major problems with depression or anxiety H/o TIA: On aspirin. Preventive care: Ideally she would need Tdap, pneumonia shot, flu shot, COVID-vaccine.  We will ask [facility] to see if that is possible.

## 2021-12-12 ENCOUNTER — Telehealth: Payer: Medicare Other | Admitting: Internal Medicine

## 2021-12-12 NOTE — Telephone Encounter (Signed)
Received community message- requesting for hospice referral.

## 2021-12-13 ENCOUNTER — Telehealth: Payer: Self-pay | Admitting: Internal Medicine

## 2021-12-13 NOTE — Telephone Encounter (Signed)
Amy Weiss is requesting progress notes, history and physical notes sent to pt's living facility. She would like a call when this is done. F#: [phone]

## 2021-12-13 NOTE — Telephone Encounter (Signed)
Referral placed.

## 2021-12-13 NOTE — Telephone Encounter (Signed)
will rodgers called from [facility] to advise paz that pt enrolled in hospice care at [facility]. any questions please contat [phone].

## 2021-12-13 NOTE — Telephone Encounter (Signed)
Requested information faxed to [facility] at number provided. Mychart message sent to [place] office visit notes have been faxed.

## 2021-12-13 NOTE — Telephone Encounter (Signed)
Agree, thank you

## 2021-12-13 NOTE — Telephone Encounter (Signed)
Received fax confirmation

## 2021-12-13 NOTE — Telephone Encounter (Signed)
Already sent to [facility] this morning as requested- see other telephone note. OV note faxed to [facility].

## 2021-12-13 NOTE — Telephone Encounter (Signed)
[name] contact ov to have recent cpe and history information sent to faculties below.   [facility]  Tele: [phone] Fax: [phone] [facility] Tele: [phone] Fax: [phone]

## 2021-12-26 ENCOUNTER — Telehealth: Payer: Self-pay

## 2021-12-26 NOTE — Telephone Encounter (Signed)
Physician orders signed and mailed back to [facility] in provided envelope. Copies sent for scanning.

## 2021-12-30 ENCOUNTER — Telehealth: Payer: Self-pay | Admitting: Internal Medicine

## 2021-12-30 NOTE — Telephone Encounter (Signed)
[facility] is requesting a physician certification statement as the pt will be going to a nursing home on Monday. Pt is currently bed bound and will need an ambulance to pick her up. Please advise.

## 2022-01-02 NOTE — Telephone Encounter (Signed)
Spoke w/ Liborio Nixon- Pt has been taken care of.

## 2022-01-02 NOTE — Telephone Encounter (Signed)
Pt is bedbound, needs ambulance transport

## 2022-01-25 ENCOUNTER — Telehealth: Payer: Self-pay | Admitting: Internal Medicine

## 2022-01-25 NOTE — Telephone Encounter (Signed)
Pt would like to inform Amy Weiss that she in the Nursing home on [address]  ? ? ?

## 2022-01-29 ENCOUNTER — Other Ambulatory Visit: Payer: Self-pay | Admitting: Internal Medicine

## 2022-02-20 ENCOUNTER — Telehealth: Payer: Self-pay

## 2022-02-20 NOTE — Telephone Encounter (Signed)
Plan of care (cert dates 0000000 to 08/05/21) signed and faxed back to [facility] at [phone]. Form sent for scanning.  ?

## 2023-03-02 ENCOUNTER — Encounter: Payer: Self-pay | Admitting: Internal Medicine

## 2023-03-05 ENCOUNTER — Encounter: Payer: Self-pay | Admitting: *Deleted

## 2024-03-27 ENCOUNTER — Telehealth: Payer: Self-pay | Admitting: Internal Medicine

## 2024-03-27 NOTE — Telephone Encounter (Addendum)
 LMOM for [name] Corbin Dess- informed that PCP hasn't seen her in almost 3 years, recommended that if current nursing home is recommending Pt to go to hospital for evaluation to let them take her, hospital has social workers who can assist in getting her placed elsewhere if that is what is needed. Informed I also did not know of any transportation help other than EMS to get her to appts.

## 2024-03-27 NOTE — Telephone Encounter (Signed)
 Copied from CRM [id number]. Topic: General - Transportation >> Mar 27, 2024  8:50 AM Juleen Oakland F wrote: Reason for CRM: Patient daughter Corbin Dess called, says Dr. Neomi Banks filled out FLT for patient to enter [facility] in 2023, she wants to know if the office can coordinate transportation for patient to come see Dr. Neomi Banks as soon as possible for a behavioral evaluation since the nursing home is trying to drop patient off to a hospital due to her behavior. Please call Corbin Dess with an update at [phone] with information on how to get patient transportation. Is she doesn't answer please leave detailed message.
